# Patient Record
Sex: Female | Born: 1935 | Race: Black or African American | Hispanic: No
Health system: Midwestern US, Community
[De-identification: ages and names within clinical notes are randomized; demographics above are authoritative.]

## PROBLEM LIST (undated history)

## (undated) DIAGNOSIS — F0280 Dementia in other diseases classified elsewhere without behavioral disturbance: Secondary | ICD-10-CM

## (undated) DIAGNOSIS — F411 Generalized anxiety disorder: Secondary | ICD-10-CM

## (undated) DIAGNOSIS — M810 Age-related osteoporosis without current pathological fracture: Secondary | ICD-10-CM

## (undated) DIAGNOSIS — R634 Abnormal weight loss: Secondary | ICD-10-CM

## (undated) DIAGNOSIS — F419 Anxiety disorder, unspecified: Secondary | ICD-10-CM

## (undated) DIAGNOSIS — E049 Nontoxic goiter, unspecified: Secondary | ICD-10-CM

## (undated) DIAGNOSIS — I499 Cardiac arrhythmia, unspecified: Secondary | ICD-10-CM

## (undated) DIAGNOSIS — R4583 Excessive crying of child, adolescent or adult: Secondary | ICD-10-CM

## (undated) DIAGNOSIS — I10 Essential (primary) hypertension: Secondary | ICD-10-CM

## (undated) DIAGNOSIS — R7309 Other abnormal glucose: Secondary | ICD-10-CM

## (undated) DIAGNOSIS — R011 Cardiac murmur, unspecified: Secondary | ICD-10-CM

## (undated) DIAGNOSIS — F32A Depression, unspecified: Secondary | ICD-10-CM

## (undated) DIAGNOSIS — K047 Periapical abscess without sinus: Secondary | ICD-10-CM

## (undated) DIAGNOSIS — F329 Major depressive disorder, single episode, unspecified: Secondary | ICD-10-CM

## (undated) DIAGNOSIS — G20 Parkinson's disease: Secondary | ICD-10-CM

## (undated) DIAGNOSIS — G20A1 Parkinson's disease without dyskinesia, without mention of fluctuations: Secondary | ICD-10-CM

## (undated) HISTORY — DX: Periapical abscess without sinus: K04.7

## (undated) HISTORY — PX: OTHER SURGICAL HISTORY: SHX169

---

## 2007-04-23 LAB — T4, FREE: T4, Free: 1.2 NG/DL (ref 0.89–1.76)

## 2007-04-23 LAB — LIPID PANEL
CHOL/HDL Ratio: 3.6 (ref 0–5.0)
Cholesterol, total: 222 MG/DL — ABNORMAL HIGH (ref <200)
HDL Cholesterol: 62 MG/DL — ABNORMAL HIGH (ref 40–60)
LDL, calculated: 117.6 MG/DL — ABNORMAL HIGH (ref 0–100)
Triglyceride: 212 MG/DL — ABNORMAL HIGH (ref 30–200)
VLDL, calculated: 42.4 MG/DL

## 2007-04-23 LAB — HEPATIC FUNCTION PANEL
A-G Ratio: 1.1 (ref 1.1–2.2)
ALT (SGPT): 36 U/L (ref 30–65)
AST (SGOT): 28 U/L (ref 15–37)
Albumin: 3.5 g/dL (ref 3.5–5.0)
Alk. phosphatase: 82 U/L (ref 50–136)
Bilirubin, direct: 0 MG/DL (ref 0.0–0.3)
Bilirubin, total: 0.2 MG/DL (ref 0–1.0)
Globulin: 3.3 g/dL (ref 2.0–4.0)
Protein, total: 6.8 g/dL (ref 6.4–8.2)

## 2007-04-23 LAB — TSH 3RD GENERATION: TSH: 0.78 u[IU]/mL (ref 0.35–5.5)

## 2007-09-09 LAB — METABOLIC PANEL, COMPREHENSIVE
A-G Ratio: 1.2 (ref 1.1–2.2)
ALT (SGPT): 37 U/L (ref 30–65)
AST (SGOT): 29 U/L (ref 15–37)
Albumin: 3.8 g/dL (ref 3.5–5.0)
Alk. phosphatase: 93 U/L (ref 50–136)
Anion gap: 5 mmol/L (ref 5–15)
BUN/Creatinine ratio: 21 — ABNORMAL HIGH (ref 12–20)
BUN: 23 MG/DL — ABNORMAL HIGH (ref 6–20)
Bilirubin, total: 0.5 MG/DL (ref <1.0)
CO2: 32 MMOL/L (ref 21–32)
Calcium: 9.5 MG/DL (ref 8.5–10.1)
Chloride: 105 MMOL/L (ref 97–108)
Creatinine: 1.1 MG/DL (ref 0.6–1.3)
GFR est AA: >60 mL/min/{1.73_m2} (ref >60)
GFR est non-AA: 52 mL/min/{1.73_m2} — ABNORMAL LOW (ref >60)
Globulin: 3.2 g/dL (ref 2.0–4.0)
Glucose: 102 MG/DL — ABNORMAL HIGH (ref 50–100)
Potassium: 4.8 MMOL/L (ref 3.5–5.1)
Protein, total: 7 g/dL (ref 6.4–8.2)
Sodium: 142 MMOL/L (ref 136–145)

## 2007-09-09 LAB — LIPID PANEL
CHOL/HDL Ratio: 3.5 (ref 0–5.0)
Cholesterol, total: 190 MG/DL (ref <200)
HDL Cholesterol: 55 MG/DL (ref 40–60)
LDL, calculated: 111.4 MG/DL — ABNORMAL HIGH (ref 0–100)
Triglyceride: 118 MG/DL (ref 30–200)
VLDL, calculated: 23.6 MG/DL

## 2007-09-17 LAB — D-DIMER, QUANTITATIVE: D-Dimer, Quant: 0.97 MG/L (ref 0.0–3.0)

## 2007-09-17 LAB — CBC WITH AUTOMATED DIFF
ABS. BASOPHILS: 0 10*3/uL (ref 0.0–0.1)
ABS. EOSINOPHILS: 0.2 10*3/uL (ref 0.0–0.4)
ABS. LYMPHOCYTES: 2.4 10*3/uL (ref 0.8–3.5)
ABS. MONOCYTES: 0.5 10*3/uL (ref 0–1.0)
ABS. NEUTROPHILS: 1.3 10*3/uL — ABNORMAL LOW (ref 1.8–8.0)
BASOPHILS: 1 % (ref 0–1)
EOSINOPHILS: 5 % (ref 0–7)
HCT: 36.8 % (ref 35.0–47.0)
HGB: 12.2 g/dL (ref 11.5–16.0)
LYMPHOCYTES: 53 % — ABNORMAL HIGH (ref 12–49)
MCH: 29.7 PG (ref 26.0–34.0)
MCHC: 33.2 g/dL (ref 30.0–35.0)
MCV: 89.5 FL (ref 80.0–99.0)
MONOCYTES: 12 % (ref 5–13)
NEUTROPHILS: 29 % — ABNORMAL LOW (ref 32–75)
PLATELET: 216 10*3/uL (ref 150–400)
RBC: 4.11 M/uL (ref 3.80–5.20)
RDW: 12.9 % (ref 11.5–14.5)
WBC: 4.4 10*3/uL (ref 3.6–11.0)

## 2007-09-17 LAB — D DIMER: D-dimer: 0.97 MG/L (ref 0.0–3.0)

## 2007-09-17 LAB — METABOLIC PANEL, COMPREHENSIVE
A-G Ratio: 1 — ABNORMAL LOW (ref 1.1–2.2)
ALT (SGPT): 36 U/L (ref 30–65)
AST (SGOT): 23 U/L (ref 15–37)
Albumin: 3.4 g/dL — ABNORMAL LOW (ref 3.5–5.0)
Alk. phosphatase: 94 U/L (ref 50–136)
Anion gap: 5 mmol/L (ref 5–15)
BUN/Creatinine ratio: 19 (ref 12–20)
BUN: 23 MG/DL — ABNORMAL HIGH (ref 6–20)
Bilirubin, total: 0.3 MG/DL (ref <1.0)
CO2: 32 MMOL/L (ref 21–32)
Calcium: 9.1 MG/DL (ref 8.5–10.1)
Chloride: 102 MMOL/L (ref 97–108)
Creatinine: 1.2 MG/DL (ref 0.6–1.3)
GFR est AA: 57 mL/min/{1.73_m2} — ABNORMAL LOW (ref >60)
GFR est non-AA: 47 mL/min/{1.73_m2} — ABNORMAL LOW (ref >60)
Globulin: 3.5 g/dL (ref 2.0–4.0)
Glucose: 106 MG/DL — ABNORMAL HIGH (ref 50–100)
Potassium: 3.9 MMOL/L (ref 3.5–5.1)
Protein, total: 6.9 g/dL (ref 6.4–8.2)
Sodium: 139 MMOL/L (ref 136–145)

## 2007-09-17 LAB — PROTHROMBIN TIME + INR
INR: 1 (ref 0.9–1.1)
Prothrombin time: 10.3 SECS (ref 9.0–11.0)

## 2007-09-17 LAB — TROPONIN I
Troponin-I, Qt.: <0.04 ng/mL (ref 0.0–0.05)
Troponin-I, Qt.: <0.04 ng/mL (ref 0.0–0.05)

## 2007-09-17 LAB — CK W/ REFLX CKMB
CK: 138 U/L (ref 21–215)
CK: 125 U/L (ref 21–215)

## 2007-09-17 LAB — PTT, ACTIVATED: aPTT: 24.1 s (ref 24.0–33.0)

## 2007-09-17 LAB — NT-PRO BNP: NT pro-BNP: 1446 PG/ML — ABNORMAL HIGH (ref 0–125)

## 2007-10-23 LAB — CULTURE, URINE
Colonies Counted: <1000
Colony Count: <1000
Culture result:: NO GROWTH
Culture: NO GROWTH

## 2008-04-09 MED ORDER — ALENDRONATE 70 MG TAB
70 mg | ORAL_TABLET | ORAL | Status: DC
Start: 2008-04-09 — End: 2009-09-19

## 2008-04-09 MED ORDER — VALSARTAN-HYDROCHLOROTHIAZIDE 160 MG-12.5 MG TAB
ORAL_TABLET | Freq: Every day | ORAL | Status: DC
Start: 2008-04-09 — End: 2009-05-20

## 2008-04-09 NOTE — Progress Notes (Signed)
Subjective:     Gina Holloway is a 74 y.o. female who presents for follow up of hypertension and hyperlipidemia.    Diet and Lifestyle: generally follows a low fat low cholesterol diet, exercises sporadically  Home BP Monitoring: is not measured at home    Cardiovascular ROS: taking medications as instructed, no medication side effects noted, no chest pain on exertion, no dyspnea on exertion, no swelling of ankles.     New concerns:none            Review of Systems, additional:  Pertinent items are noted in HPI.    Objective:     BP 136/62   Pulse 84   Temp(Src) 98.7 ??F (37.1 ??C) (Oral)   Wt 161 lb (73.029 kg)   SpO2 97%  Appearance: alert, well appearing, and in no distress.thyroid mildly palpable goiter.  General exam: CVS exam BP noted to be well controlled today in office, S1, S2 normal, no gallop, no murmur, chest clear, no JVD, no HSM, no edema, CVS exam  - normal rate, regular rhythm, normal S1, S2, no murmurs, rubs, clicks or gallops, normal bilateral carotid upstroke without bruits.  Lab review: orders written for new lab studies as appropriate; see orders.     Assessment:     hypertension well controlled, hyperlipidemia stable.  Osteoporosis, thyroid goiter  Plan:     current treatment plan is effective, no change in therapy  reviewed diet, exercise and weight control.   also given rx for fosamax. Advise to continue with otc calcuim.   Fu 4 months

## 2008-04-09 NOTE — Progress Notes (Signed)
Follow up    Need medication refills on Diovan-160/12.5mg , Protonix 40mg , and Metoprolol ER-25mg 

## 2008-04-10 ENCOUNTER — Ambulatory Visit

## 2008-04-10 LAB — METABOLIC PANEL, COMPREHENSIVE
A-G Ratio: 1.1 (ref 1.1–2.2)
ALT (SGPT): 44 U/L (ref 30–65)
AST (SGOT): 32 U/L (ref 15–37)
Albumin: 3.6 g/dL (ref 3.5–5.0)
Alk. phosphatase: 113 U/L (ref 50–136)
Anion gap: 8 mmol/L (ref 5–15)
BUN/Creatinine ratio: 16 (ref 12–20)
BUN: 19 MG/DL (ref 6–20)
Bilirubin, total: 0.2 MG/DL (ref 0.2–1.0)
CO2: 32 MMOL/L (ref 21–32)
Calcium: 9.5 MG/DL (ref 8.5–10.1)
Chloride: 102 MMOL/L (ref 97–108)
Creatinine: 1.2 MG/DL (ref 0.6–1.3)
GFR est AA: 57 mL/min/{1.73_m2} — ABNORMAL LOW (ref >60)
GFR est non-AA: 47 mL/min/{1.73_m2} — ABNORMAL LOW (ref >60)
Globulin: 3.2 g/dL (ref 2.0–4.0)
Glucose: 126 MG/DL — ABNORMAL HIGH (ref 50–100)
Potassium: 4.4 MMOL/L (ref 3.5–5.1)
Protein, total: 6.8 g/dL (ref 6.4–8.4)
Sodium: 142 MMOL/L (ref 136–145)

## 2008-04-10 LAB — CBC WITH AUTOMATED DIFF
ABS. BASOPHILS: 0 10*3/uL (ref 0.0–0.1)
ABS. EOSINOPHILS: 0.1 10*3/uL (ref 0.0–0.4)
ABS. LYMPHOCYTES: 2.2 10*3/uL (ref 0.8–3.5)
ABS. MONOCYTES: 0.4 10*3/uL (ref 0.0–1.0)
ABS. NEUTROPHILS: 1.7 10*3/uL — ABNORMAL LOW (ref 1.8–8.0)
BASOPHILS: 1 % (ref 0–1)
EOSINOPHILS: 2 % (ref 0–7)
HCT: 38.3 % (ref 35.0–47.0)
HGB: 12.4 g/dL (ref 11.5–16.0)
LYMPHOCYTES: 48 % (ref 12–49)
MCH: 28.8 PG (ref 26.0–34.0)
MCHC: 32.4 g/dL (ref 30.0–36.5)
MCV: 88.9 FL (ref 80.0–99.0)
MONOCYTES: 10 % (ref 5–13)
NEUTROPHILS: 39 % (ref 32–75)
PLATELET: 230 10*3/uL (ref 150–400)
RBC: 4.31 M/uL (ref 3.80–5.20)
RDW: 12.9 % (ref 11.5–14.5)
WBC: 4.5 10*3/uL (ref 3.6–11.0)

## 2008-04-10 LAB — LIPID PANEL
CHOL/HDL Ratio: 2.6 (ref 0–5.0)
Cholesterol, total: 179 MG/DL (ref <200)
HDL Cholesterol: 69 MG/DL — ABNORMAL HIGH (ref 40–60)
LDL, calculated: 75.2 MG/DL (ref 0–100)
Triglyceride: 174 MG/DL (ref 30–200)
VLDL, calculated: 34.8 MG/DL

## 2008-04-10 LAB — TSH 3RD GENERATION: TSH: 0.7 u[IU]/mL (ref 0.36–3.74)

## 2008-04-10 LAB — T4, FREE: T4, Free: 1.1 NG/DL (ref 0.8–1.5)

## 2008-04-12 LAB — VITAMIN D, 25 HYDROXY: Vitamin D 25-Hydroxy: 34 ng/mL (ref 30–80)

## 2008-08-13 ENCOUNTER — Ambulatory Visit

## 2008-08-13 LAB — AMB POC GLUCOSE BLOOD, BY GLUCOSE MONITORING DEVICE

## 2008-08-13 MED ORDER — ALPRAZOLAM 0.25 MG TAB
0.25 mg | ORAL_TABLET | Freq: Two times a day (BID) | ORAL | Status: DC | PRN
Start: 2008-08-13 — End: 2009-09-19

## 2008-08-13 MED ORDER — ATORVASTATIN 40 MG TAB
40 mg | ORAL_TABLET | Freq: Every evening | ORAL | Status: DC
Start: 2008-08-13 — End: 2009-10-20

## 2008-08-13 MED ORDER — METOPROLOL SUCCINATE SR 25 MG 24 HR TAB
25 mg | ORAL_TABLET | Freq: Every day | ORAL | Status: DC
Start: 2008-08-13 — End: 2009-08-16

## 2008-08-13 NOTE — Progress Notes (Signed)
4 month follow up    Pt c/o right wrist pain that comes and goes- x 1 month    Pt c/o having a burning sensation in fingers on left hand x 1 month

## 2008-08-14 LAB — METABOLIC PANEL, BASIC
Anion gap: 9 mmol/L (ref 5–15)
BUN/Creatinine ratio: 17 (ref 12–20)
BUN: 20 MG/DL (ref 6–20)
CO2: 30 MMOL/L (ref 21–32)
Calcium: 9.1 MG/DL (ref 8.5–10.1)
Chloride: 99 MMOL/L (ref 97–108)
Creatinine: 1.2 MG/DL (ref 0.6–1.3)
GFR est AA: 57 mL/min/{1.73_m2} — ABNORMAL LOW (ref >60)
GFR est non-AA: 47 mL/min/{1.73_m2} — ABNORMAL LOW (ref >60)
Glucose: 89 MG/DL (ref 65–100)
Potassium: 4.2 MMOL/L (ref 3.5–5.1)
Sodium: 138 MMOL/L (ref 136–145)

## 2008-08-14 LAB — HEMOGLOBIN A1C WITH EAG: Hemoglobin A1c: 6 % — ABNORMAL HIGH (ref 4.2–5.8)

## 2008-11-02 NOTE — Progress Notes (Signed)
HISTORY OF PRESENT ILLNESS  Gina Holloway is a 74 y.o. female.  HPI for follow up.  Having increas in burning senation in the left hand/finger. Is intermittent.  No triggering factior noted.  No pain at night usually.  Also having right wrist pain.  No know injury. Hurts with ROM.  Cardiovascular Review:  The patient has hypertension and hyperlipidemia.  Diet and Lifestyle: generally follows a low fat low cholesterol diet, generally follows a low sodium diet, exercises sporadically  Home BP Monitoring: is not measured at home.  Pertinent ROS: taking medications as instructed, no medication side effects noted, no TIA's, no chest pain on exertion, no dyspnea on exertion, no swelling of ankles.      Review of Systems   Constitutional: Negative for malaise/fatigue.   HENT: Negative for congestion.    Eyes: Negative for blurred vision.   Respiratory: Negative for cough and shortness of breath.    Cardiovascular: Negative for chest pain, palpitations and leg swelling.   Gastrointestinal: Negative for heartburn, abdominal pain and constipation.   Genitourinary: Negative for dysuria, urgency and frequency.   Musculoskeletal: Negative for back pain and joint pain.   Neurological: Negative for dizziness, tingling and headaches.   Endo/Heme/Allergies: Negative for environmental allergies.   Psychiatric/Behavioral: Negative for depression. The patient does not have insomnia.        Physical Exam   Vitals reviewed.  Constitutional: She is oriented to person, place, and time. She appears well-developed and well-nourished.   HENT:   Right Ear: Tympanic membrane and ear canal normal.   Left Ear: Tympanic membrane and ear canal normal.   Nose: Nose normal. No mucosal edema or rhinorrhea.   Mouth/Throat: Oropharynx is clear and moist and mucous membranes are normal. No oropharyngeal exudate or posterior oropharyngeal erythema.    Neck: Trachea normal, normal range of motion and full passive range of motion without pain. Neck supple. No JVD present. No edema present. No thyromegaly present.   Cardiovascular: Normal rate and regular rhythm.    No murmur heard.  Pulmonary/Chest: Effort normal and breath sounds normal.   Abdominal: Soft. Normal appearance and bowel sounds are normal. She exhibits no distension. No tenderness.   Musculoskeletal: Normal range of motion. She exhibits no edema.        Right wrist: She exhibits tenderness. She exhibits normal range of motion.        Left hand: She exhibits normal range of motion and no tenderness. normal sensation noted. Decreased sensation is not present in the ulnar distribution, is not present in the medial redistribution and is not present in the radial distribution. Normal strength noted.   Lymphadenopathy:     She has no cervical adenopathy.   Neurological: She is alert and oriented to person, place, and time. She has normal strength. No sensory deficit. Gait normal.   Skin: Skin is warm and dry. No rash noted.       ASSESSMENT and PLAN  Simra was seen today for follow-up, other and wrist pain.    Diagnoses and associated orders for this visit:    Htn (hypertension)  - METABOLIC PANEL, BASIC; Future  - metoprolol-XL (TOPROL XL) 25 mg XL tablet; Take 1 Tab by mouth daily.    Gerd (gastroesophageal reflux disease)    Hypercholesterolemia  - atorvastatin (LIPITOR) 40 mg tablet; Take 1 Tab by mouth nightly.    Thyroid goiter    Cervical /Neuralgia  - XR SPINE CERVICAL COMP WITH OBLIQUES; Future  - naproxen (NAPROSYN)  500 mg tablet; Take 1 Tab by mouth two (2) times daily (with meals).    Tenosynovitis, wrist  - naproxen (NAPROSYN) 500 mg tablet; Take 1 Tab by mouth two (2) times daily (with meals).    Anxiety state, unspecified  - alprazolam (XANAX) 0.25 mg tablet; Take 1 Tab by mouth two (2) times daily as needed.    Other abnormal glucose  - AMB POC GLUCOSE BLOOD, BY GLUCOSE MONITORING DEVICE   - HEMOGLOBIN A1C; Future    Encounter for long-term (current) use of other medications    Other Orders  - Discontinue: alprazolam (XANAX) 0.25 mg tablet; Take 0.25 mg by mouth two (2) times daily as needed.        Follow-up Disposition:  Return in about 4 months (around 12/14/2008).  reviewed diet, exercise and weight control

## 2008-12-17 MED ORDER — AMOXICILLIN 500 MG CAP
500 mg | ORAL_CAPSULE | Freq: Three times a day (TID) | ORAL | Status: AC
Start: 2008-12-17 — End: 2008-12-27

## 2008-12-17 NOTE — Progress Notes (Signed)
HISTORY OF PRESENT ILLNESS  Gina Holloway is a 74 y.o. female.  HPI for follow up.  Overall doing well.  Does c/o cough and nasal congestion for the past several days.  Nose is dry and crusty.  Has post nasal drainage.  Cardiovascular Review:  The patient has hypertension and hyperlipidemia.  Diet and Lifestyle: generally follows a low fat low cholesterol diet, generally follows a low sodium diet, exercises sporadically  Home BP Monitoring: is not measured at home.  Pertinent ROS: taking medications as instructed, no medication side effects noted, no TIA's, no chest pain on exertion, no dyspnea on exertion, no swelling of ankles.      Review of Systems   Constitutional: Negative for fever, chills and malaise/fatigue.   HENT: Positive for congestion.    Eyes: Negative for blurred vision.   Respiratory: Positive for cough. Negative for sputum production and shortness of breath.    Cardiovascular: Negative for chest pain, palpitations and leg swelling.   Gastrointestinal: Negative for heartburn, abdominal pain and constipation.   Genitourinary: Negative for dysuria, urgency and frequency.   Musculoskeletal: Negative for back pain and joint pain.   Neurological: Negative for dizziness, tingling and headaches.   Endo/Heme/Allergies: Positive for environmental allergies.   Psychiatric/Behavioral: Negative for depression. The patient does not have insomnia.        Physical Exam   Vitals reviewed.  Constitutional: She is oriented to person, place, and time. She appears well-developed and well-nourished.   HENT:   Right Ear: Tympanic membrane and ear canal normal.   Left Ear: Tympanic membrane and ear canal normal.   Nose: Mucosal edema and rhinorrhea present. Right sinus exhibits no maxillary sinus tenderness. Left sinus exhibits no maxillary sinus tenderness.   Mouth/Throat: Mucous membranes are normal. Posterior oropharyngeal erythema present. No oropharyngeal exudate.    Eyes: Pupils are equal, round, and reactive to light.   Neck: Trachea normal, normal range of motion and full passive range of motion without pain. Neck supple. No JVD present. No edema present. No thyromegaly present.   Cardiovascular: Normal rate and regular rhythm.    No murmur heard.  Pulmonary/Chest: Effort normal and breath sounds normal.   Abdominal: Soft. Normal appearance and bowel sounds are normal. She exhibits no distension. No tenderness.   Musculoskeletal: Normal range of motion. She exhibits no edema.   Lymphadenopathy:     She has no cervical adenopathy.   Neurological: She is alert and oriented to person, place, and time. She has normal strength. No sensory deficit. Gait normal.   Skin: Skin is warm and dry. No rash noted.       ASSESSMENT and PLAN  Anairis was seen today for hypertension, nasal congestion and cough.    Diagnoses and associated orders for this visit:  Overall stable.    Htn (hypertension)    Hypercholesterolemia    Gerd (gastroesophageal reflux disease)    Thyroid goiter    Osteoporosis    Acute upper respiratory infections of unspecified   - sodium chloride (AYR SALINE) 0.65 % nasal spray; 1 Spray by Nasal route as needed for Congestion.  - amoxicillin (AMOXIL) 500 mg capsule; Take 1 Cap by mouth three (3) times daily for 10 days.    Encounter for long-term (current) use of other medications        Follow-up Disposition:  Return in about 4 months (around 04/16/2009).  reviewed diet, exercise and weight control  cardiovascular risk and specific lipid/LDL goals reviewed  Labs with next ov.

## 2009-03-01 MED ORDER — AMOXICILLIN 500 MG CAP
500 mg | ORAL_CAPSULE | Freq: Three times a day (TID) | ORAL | Status: AC
Start: 2009-03-01 — End: 2009-03-11

## 2009-03-01 NOTE — Telephone Encounter (Signed)
Patient has blood tinged mucus in her sinuses. She went to [facility], and they gave her a nasal spray and some eye drops, but no antibiotic. She is requesting an antibiotic at this time.

## 2009-04-19 NOTE — Progress Notes (Signed)
HISTORY OF PRESENT ILLNESS  Gina Holloway is a 75 y.o. female.  HPI for follow up.  Doing well.    Cardiovascular Review:  The patient has hypertension and hyperlipidemia.  Diet and Lifestyle: generally follows a low fat low cholesterol diet, generally follows a low sodium diet, exercises sporadically  Home BP Monitoring: is not measured at home.  Pertinent ROS: taking medications as instructed, no medication side effects noted, no TIA's, no chest pain on exertion, no dyspnea on exertion, no swelling of ankles.   Thyroid Review:  Patient is seen for followup of warm/hot thyroid nodule.   Thyroid ROS: denies fatigue, weight changes, heat/cold intolerance, bowel/skin changes or CVS symptoms and thinks goiter is larger.     Review of Systems   Constitutional: Negative for malaise/fatigue.   HENT: Negative for congestion.    Eyes: Negative for blurred vision.   Respiratory: Negative for cough and shortness of breath.    Cardiovascular: Negative for chest pain, palpitations and leg swelling.   Gastrointestinal: Negative for heartburn, abdominal pain and constipation.   Genitourinary: Negative for dysuria, urgency and frequency.   Musculoskeletal: Negative for back pain and joint pain.   Neurological: Negative for dizziness, tingling and headaches.   Endo/Heme/Allergies: Negative for environmental allergies.   Psychiatric/Behavioral: Negative for depression. The patient does not have insomnia.        Physical Exam   Vitals reviewed.  Constitutional: She appears well-developed and well-nourished.   HENT:   Right Ear: Tympanic membrane and ear canal normal.   Left Ear: Tympanic membrane and ear canal normal.   Nose: No mucosal edema or rhinorrhea.   Mouth/Throat: Oropharynx is clear and moist and mucous membranes are normal.   Neck: Normal range of motion. Neck supple. Thyromegaly (slightly more fullnes in the mid gland.) present.   Cardiovascular: Normal rate and regular rhythm.    No murmur heard.   Pulmonary/Chest: Effort normal and breath sounds normal.   Abdominal: Soft. Bowel sounds are normal. No tenderness.   Musculoskeletal: Normal range of motion. She exhibits no edema.   Lymphadenopathy:     She has no cervical adenopathy.   Skin: Skin is warm and dry.   Psychiatric: She has a normal mood and affect.       ASSESSMENT and PLAN  Gina Holloway was seen today for hypertension.    Diagnoses and associated orders for this visit:    Htn (hypertension)    Hypercholesterolemia  - LIPID PANEL    Gerd (gastroesophageal reflux disease)    Thyroid goiter  - US THYROID / PARATHYROID; Future  - TSH, 3RD GENERATION  - T4, FREE    Osteoporosis  - VITAMIN D, 25 HYDROXY    Encounter for long-term (current) use of other medications  - METABOLIC PANEL, COMPREHENSIVE  - CBC W/O DIFF        Follow-up Disposition:  Return in about 5 months (around 09/19/2009).  reviewed diet, exercise and weight control  cardiovascular risk and specific lipid/LDL goals reviewed

## 2009-04-19 NOTE — Progress Notes (Deleted)
HISTORY OF PRESENT ILLNESS  Gina Holloway is a 75 y.o. female.  HPI for follow up.  Overall doing well.    Cardiovascular Review:  The patient has hypertension and hyperlipidemia.  Diet and Lifestyle: generally follows a low fat low cholesterol diet, generally follows a low sodium diet, exercises sporadically  Home BP Monitoring: is not measured at home.  Pertinent ROS: taking medications as instructed, no medication side effects noted, no TIA's, no chest pain on exertion, no dyspnea on exertion, no swelling of ankles.      Review of Systems   Constitutional: Negative for fever, chills and malaise/fatigue.   HENT: Positive for congestion.    Eyes: Negative for blurred vision.   Respiratory: Positive for cough. Negative for sputum production and shortness of breath.    Cardiovascular: Negative for chest pain, palpitations and leg swelling.   Gastrointestinal: Negative for heartburn, abdominal pain and constipation.   Genitourinary: Negative for dysuria, urgency and frequency.   Musculoskeletal: Negative for back pain and joint pain.   Neurological: Negative for dizziness, tingling and headaches.   Endo/Heme/Allergies: Positive for environmental allergies.   Psychiatric/Behavioral: Negative for depression. The patient does not have insomnia.        Physical Exam   Vitals reviewed.  Constitutional: She is oriented to person, place, and time. She appears well-developed and well-nourished.   HENT:   Right Ear: Tympanic membrane and ear canal normal.   Left Ear: Tympanic membrane and ear canal normal.   Nose: Mucosal edema and rhinorrhea present. Right sinus exhibits no maxillary sinus tenderness. Left sinus exhibits no maxillary sinus tenderness.   Mouth/Throat: Mucous membranes are normal. Posterior oropharyngeal erythema present. No oropharyngeal exudate.   Eyes: Pupils are equal, round, and reactive to light.    Neck: Trachea normal, normal range of motion and full passive range of motion without pain. Neck supple. No JVD present. No edema present. No thyromegaly present.   Cardiovascular: Normal rate and regular rhythm.    No murmur heard.  Pulmonary/Chest: Effort normal and breath sounds normal.   Abdominal: Soft. Normal appearance and bowel sounds are normal. She exhibits no distension. No tenderness.   Musculoskeletal: Normal range of motion. She exhibits no edema.   Lymphadenopathy:     She has no cervical adenopathy.   Neurological: She is alert and oriented to person, place, and time. She has normal strength. No sensory deficit. Gait normal.   Skin: Skin is warm and dry. No rash noted.       ASSESSMENT and PLAN  Gina Holloway was seen today for hypertension, nasal congestion and cough.    Diagnoses and associated orders for this visit:  Overall stable.    Htn (hypertension)    Hypercholesterolemia    Gerd (gastroesophageal reflux disease)    Thyroid goiter    Osteoporosis    Acute upper respiratory infections of unspecified   - sodium chloride (AYR SALINE) 0.65 % nasal spray; 1 Spray by Nasal route as needed for Congestion.  - amoxicillin (AMOXIL) 500 mg capsule; Take 1 Cap by mouth three (3) times daily for 10 days.    Encounter for long-term (current) use of other medications        Follow-up Disposition:  Return in about 5 months (around 09/19/2009).  reviewed diet, exercise and weight control  cardiovascular risk and specific lipid/LDL goals reviewed  Labs with next ov.

## 2009-04-20 LAB — METABOLIC PANEL, COMPREHENSIVE
A-G Ratio: 1.5 (ref 1.1–2.5)
ALT (SGPT): 21 IU/L (ref 0–40)
AST (SGOT): 32 IU/L (ref 0–40)
Albumin: 4 g/dL (ref 3.5–4.8)
Alk. phosphatase: 64 IU/L (ref 25–165)
BUN/Creatinine ratio: 16 (ref 11–26)
BUN: 18 mg/dL (ref 8–27)
Bilirubin, total: 0.5 mg/dL (ref 0.0–1.2)
CO2: 28 mmol/L (ref 20–32)
Calcium: 9.2 mg/dL (ref 8.6–10.2)
Chloride: 101 mmol/L (ref 97–108)
Creatinine: 1.12 mg/dL — ABNORMAL HIGH (ref 0.57–1.00)
GFR est AA: 58 mL/min/{1.73_m2} — ABNORMAL LOW (ref >59)
GFR est non-AA: 48 mL/min/{1.73_m2} — ABNORMAL LOW (ref >59)
GLOBULIN, TOTAL: 2.6 g/dL (ref 1.5–4.5)
Glucose: 103 mg/dL — ABNORMAL HIGH (ref 65–99)
Potassium: 4.4 mmol/L (ref 3.5–5.2)
Protein, total: 6.6 g/dL (ref 6.0–8.5)
Sodium: 139 mmol/L (ref 135–145)

## 2009-04-20 LAB — CBC W/O DIFF
HCT: 41.4 % (ref 34.0–44.0)
HGB: 13.5 g/dL (ref 11.5–15.0)
MCH: 29.3 pg (ref 27.0–34.0)
MCHC: 32.6 g/dL (ref 32.0–36.0)
MCV: 90 fL (ref 80–98)
PLATELET: 231 10*3/uL (ref 140–415)
RBC: 4.61 x10E6/uL (ref 3.80–5.10)
RDW: 12.6 % (ref 11.7–15.0)
WBC: 4.6 10*3/uL (ref 4.0–10.5)

## 2009-04-20 LAB — LIPID PANEL
Cholesterol, total: 196 mg/dL (ref 100–199)
HDL Cholesterol: 69 mg/dL (ref >39)
LDL, calculated: 101 mg/dL — ABNORMAL HIGH (ref 0–99)
Triglyceride: 132 mg/dL (ref 0–149)
VLDL, calculated: 26 mg/dL (ref 5–40)

## 2009-04-20 LAB — TSH 3RD GENERATION: TSH: 1.11 u[IU]/mL (ref 0.450–4.500)

## 2009-04-20 LAB — VITAMIN D, 25 HYDROXY: Vitamin D 25-Hydroxy: 35.1 ng/mL (ref 32.0–100.0)

## 2009-04-20 LAB — T4, FREE: T4, Free: 1.29 ng/dL (ref 0.82–1.77)

## 2009-05-20 MED ORDER — VALSARTAN-HYDROCHLOROTHIAZIDE 160 MG-12.5 MG TAB
ORAL_TABLET | ORAL | Status: DC
Start: 2009-05-20 — End: 2009-07-18

## 2009-06-01 NOTE — Telephone Encounter (Signed)
Called pt- went to [facility] was given Zpak- not getting better- pt has only had 2 doses of antibiotic- explained to pt need to finish antibiotic and if not feeling better by Friday call back. Need to give time for med to work- pt verbalized understanding

## 2009-07-18 MED ORDER — VALSARTAN-HYDROCHLOROTHIAZIDE 160 MG-12.5 MG TAB
ORAL_TABLET | ORAL | Status: DC
Start: 2009-07-18 — End: 2009-09-19

## 2009-08-17 MED ORDER — METOPROLOL SUCCINATE SR 25 MG 24 HR TAB
25 mg | ORAL_TABLET | ORAL | Status: DC
Start: 2009-08-17 — End: 2009-09-19

## 2009-08-17 MED ORDER — METOPROLOL SUCCINATE SR 25 MG 24 HR TAB
25 mg | ORAL_TABLET | ORAL | Status: DC
Start: 2009-08-17 — End: 2009-08-17

## 2009-08-18 MED ORDER — METOPROLOL SUCCINATE SR 25 MG 24 HR TAB
25 mg | ORAL_TABLET | ORAL | Status: DC
Start: 2009-08-18 — End: 2009-09-19

## 2009-09-19 LAB — AMB POC URINALYSIS DIP STICK AUTO W/ MICRO
Bilirubin (UA POC): NEGATIVE
Blood (UA POC): NEGATIVE
Glucose (UA POC): NEGATIVE
Ketones (UA POC): NEGATIVE
Nitrites (UA POC): NEGATIVE
Protein (UA POC): NEGATIVE mg/dL
Specific gravity (UA POC): 1.015 (ref 1.001–1.035)
pH (UA POC): 6 (ref 4.6–8.0)

## 2009-09-19 MED ORDER — METOPROLOL SUCCINATE SR 25 MG 24 HR TAB
25 mg | ORAL_TABLET | Freq: Every day | ORAL | Status: DC
Start: 2009-09-19 — End: 2010-05-23

## 2009-09-19 MED ORDER — DEXLANSOPRAZOLE 60 MG CAPSULE,BIPHASE DELAYED RELEASE
60 mg | ORAL_TABLET | Freq: Every day | ORAL | Status: DC
Start: 2009-09-19 — End: 2010-01-16

## 2009-09-19 MED ORDER — VALSARTAN-HYDROCHLOROTHIAZIDE 160 MG-25 MG TAB
160-25 mg | ORAL_TABLET | Freq: Every day | ORAL | Status: DC
Start: 2009-09-19 — End: 2010-01-16

## 2009-09-19 NOTE — Progress Notes (Signed)
HISTORY OF PRESENT ILLNESS  Gina Holloway is a 75 y.o. female.  HPI for follow up.  Feeling bad over the past several weeks  C/o fatigue and has no energy.  decrease in appetite.  Having increase in heartburn sx and palpitations.  No sob.  Muscle aches all over.  Seen by cardiology and has on event monitor now.  Previous cardiac work up at [facility] last year included cardiac cath with findings of insignificant CAD.      Patient Active Problem List   Diagnoses Code   ??? HTN (Hypertension) 401.9AF   ??? Hypercholesterolemia 272.0L   ??? GERD (Gastroesophageal Reflux Disease) 530.81S   ??? Thyroid Goiter 240.9CD   ??? Osteoporosis 733.00C   ??? Encounter for long-term (current) use of other medications V58.69       Current outpatient prescriptions   Medication Sig Dispense Refill   ??? metoprolol-XL (TOPROL-XL) 25 mg XL tablet Take 1 Tab by mouth daily.  90 Tab  3   ??? valsartan-hydrochlorothiazide (DIOVAN-HCT) 160-25 mg per tablet Take 1 Tab by mouth daily.  30 Tab  1   ??? Dexlansoprazole (DEXILANT) 60 mg CpDM Take 1 Tab by mouth daily.  5 Tab  0   ??? atorvastatin (LIPITOR) 40 mg tablet Take 1 Tab by mouth nightly.  90 Tab  3   ??? aspirin 81 mg Tab Take 81 mg by mouth daily.            Review of Systems   Constitutional: Positive for malaise/fatigue.   HENT: Negative for congestion.    Eyes: Negative for blurred vision.   Respiratory: Negative for cough and shortness of breath.    Cardiovascular: Positive for palpitations. Negative for chest pain and leg swelling.   Gastrointestinal: Positive for heartburn and nausea. Negative for abdominal pain, diarrhea, constipation, blood in stool and melena.   Genitourinary: Negative for dysuria, urgency and frequency.   Musculoskeletal: Positive for myalgias. Negative for back pain and joint pain.   Neurological: Negative for dizziness, tingling and headaches.   Endo/Heme/Allergies: Negative for environmental allergies.    Psychiatric/Behavioral: Negative for depression. The patient does not have insomnia.        Physical Exam   Vitals reviewed.  Constitutional: She appears well-developed and well-nourished.   HENT:   Right Ear: Tympanic membrane and ear canal normal.   Left Ear: Tympanic membrane and ear canal normal.   Nose: No mucosal edema or rhinorrhea.   Mouth/Throat: Oropharynx is clear and moist and mucous membranes are normal.   Neck: Normal range of motion. Neck supple. No thyromegaly present.   Cardiovascular: Normal rate and regular rhythm.    No murmur heard.  Pulmonary/Chest: Effort normal and breath sounds normal.   Abdominal: Soft. Bowel sounds are normal. No tenderness.   Musculoskeletal: Normal range of motion. She exhibits no edema.   Lymphadenopathy:     She has no cervical adenopathy.   Skin: Skin is warm and dry.   Psychiatric: She has a normal mood and affect.       ASSESSMENT and PLAN  [name] was seen today for follow-up.    Diagnoses and associated orders for this visit:    Htn (hypertension) not to goal.  - metoprolol-XL (TOPROL-XL) 25 mg XL tablet; Take 1 Tab by mouth daily.  - Increase valsartan-hydrochlorothiazide (DIOVAN-HCT) 160-25 mg per tablet; Take 1 Tab by mouth daily.  Discussed sodium restriction, high k rich diet, maintaining ideal body weight and regular exercise program such as daily walking  30 min perday 4-5 times per week, as physiologic means to achieve blood pressure control.??     Hypercholesterolemia  - LIPID PANEL    Fatigue/myalgias  Hold on the lipitor for the next 2 weeks to see if this improves sx.    Palpitations  As per cardiology    Genella Rife (gastroesophageal reflux disease)  - REFERRAL TO GASTROENTEROLOGY  - Dexlansoprazole (DEXILANT) 60 mg CpDM; Take 1 Tab by mouth daily.    Thyroid goiter  - TSH, 3RD GENERATION  - T4, FREE    Osteoporosis    Encounter for long-term (current) use of other medications  - AMB POC COMPLETE CBC, AUTOMATED  - CK   - AMB POC URINALYSIS DIP STICK AUTO W/ MICRO        Follow-up Disposition:  Return in about 1 month (around 10/20/2009).  reviewed diet, exercise and weight control  cardiovascular risk and specific lipid/LDL goals reviewed  reviewed medications and side effects in detail

## 2009-09-20 LAB — LIPID PANEL
Cholesterol, total: 177 mg/dL (ref 100–199)
HDL Cholesterol: 74 mg/dL (ref >39)
LDL, calculated: 86 mg/dL (ref 0–99)
Triglyceride: 85 mg/dL (ref 0–149)
VLDL, calculated: 17 mg/dL (ref 5–40)

## 2009-09-20 LAB — T4, FREE: T4, Free: 1.47 ng/dL (ref 0.82–1.77)

## 2009-09-20 LAB — CK: Creatine Kinase,Total: 142 U/L (ref 24–173)

## 2009-09-20 LAB — TSH 3RD GENERATION: TSH: 0.733 u[IU]/mL (ref 0.450–4.500)

## 2009-10-12 LAB — AMB POC GLUCOSE, QUANTITATIVE, BLOOD

## 2009-10-12 LAB — AMB POC URINALYSIS DIP STICK AUTO W/ MICRO
Bilirubin (UA POC): NEGATIVE
Blood (UA POC): NEGATIVE
Glucose (UA POC): NEGATIVE
Leukocyte esterase (UA POC): NEGATIVE
Nitrites (UA POC): NEGATIVE
Protein (UA POC): NEGATIVE mg/dL
Specific gravity (UA POC): 1.025 (ref 1.001–1.035)
pH (UA POC): 6 (ref 4.6–8.0)

## 2009-10-12 NOTE — Progress Notes (Signed)
HISTORY OF PRESENT ILLNESS  Gina Holloway is a 75 y.o. female.  HPI for follow up hospital.  Gina Holloway to [facility] er for chest pain.  Had stress test per pt that was normal.  Still feeling bad.  Has no energy.  palpitations comes and goes.  Sleeping ok,  Diminished appetite.  No abd pain noted.  No fever or chills.    Patient Active Problem List   Diagnoses Code   ??? HTN (Hypertension) 401.9AF   ??? Hypercholesterolemia 272.0L   ??? GERD (Gastroesophageal Reflux Disease) 530.81S   ??? Thyroid Goiter 240.9CD   ??? Osteoporosis 733.00C   ??? Encounter for long-term (current) use of other medications V58.69       Current outpatient prescriptions   Medication Sig Dispense Refill   ??? sucralfate (CARAFATE) 1 gram tablet Take 0.5 g by mouth four (4) times daily.       ??? metoprolol-XL (TOPROL-XL) 25 mg XL tablet Take 1 Tab by mouth daily.  90 Tab  3   ??? valsartan-hydrochlorothiazide (DIOVAN-HCT) 160-25 mg per tablet Take 1 Tab by mouth daily.  30 Tab  1   ??? Dexlansoprazole (DEXILANT) 60 mg CpDM Take 1 Tab by mouth daily.  5 Tab  0   ??? atorvastatin (LIPITOR) 40 mg tablet Take 1 Tab by mouth nightly.  90 Tab  3   ??? aspirin 81 mg Tab Take 81 mg by mouth daily.            Review of Systems   Constitutional: Positive for malaise/fatigue. Negative for fever and chills.   HENT: Negative for congestion.    Eyes: Negative for blurred vision.   Respiratory: Negative for cough and shortness of breath.    Cardiovascular: Negative for chest pain, palpitations and leg swelling.   Gastrointestinal: Negative for heartburn, abdominal pain and constipation.   Genitourinary: Negative for dysuria, urgency and frequency.   Musculoskeletal: Negative for myalgias, back pain and joint pain.   Neurological: Negative for dizziness, tingling and headaches.   Endo/Heme/Allergies: Negative for environmental allergies.   Psychiatric/Behavioral: Negative for depression and memory loss. The patient is not nervous/anxious and does not have insomnia.        Physical Exam    Vitals reviewed.  Constitutional: She is oriented to person, place, and time. She appears well-developed and well-nourished. No distress.   HENT:   Right Ear: Tympanic membrane and ear canal normal.   Left Ear: Tympanic membrane and ear canal normal.   Nose: No mucosal edema or rhinorrhea.   Mouth/Throat: Oropharynx is clear and moist and mucous membranes are normal.   Neck: Normal range of motion. Neck supple. No thyromegaly present.   Cardiovascular: Normal rate and regular rhythm.    No murmur heard.  Pulmonary/Chest: Effort normal and breath sounds normal.   Abdominal: Soft. Bowel sounds are normal. No tenderness.   Musculoskeletal: Normal range of motion. She exhibits no edema.   Lymphadenopathy:     She has no cervical adenopathy.   Neurological: She is alert and oriented to person, place, and time. She has normal reflexes. No cranial nerve deficit or sensory deficit. She exhibits normal muscle tone. Coordination and gait normal.   Skin: Skin is warm and dry.   Psychiatric: She has a normal mood and affect.       ASSESSMENT and PLAN  Gina Holloway was seen today for follow-up, irregular heart beat and shortness of breath.    Diagnoses and associated orders for this visit:    [facility]  and fatigue    Atypical chest pain    GERD  - sucralfate (CARAFATE) 1 gram tablet; Take 0.5 g by mouth four (4) times daily.    Htn (hypertension)    Hypercholesterolemia    Palpitations  - MAGNESIUM    Other nonspecific finding on examination of urine  - CULTURE, URINE    Encounter for long-term (current) use of other medications  - CBC W/O DIFF  - AMB POC GLUCOSE, QUANTITATIVE, BLOOD  - METABOLIC PANEL, COMPREHENSIVE  - AMB POC URINALYSIS DIP STICK AUTO W/ MICRO              Follow-up Disposition:  Return in about 1 month (around 11/11/2009).  reviewed diet, exercise and weight control  cardiovascular risk and specific lipid/LDL goals reviewed  Will request records from [facility] to reveiwed

## 2009-10-13 LAB — CBC W/O DIFF
HCT: 40.2 % (ref 34.0–44.0)
HGB: 13.4 g/dL (ref 11.5–15.0)
MCH: 29.6 pg (ref 27.0–34.0)
MCHC: 33.3 g/dL (ref 32.0–36.0)
MCV: 89 fL (ref 80–98)
PLATELET: 232 10*3/uL (ref 140–415)
RBC: 4.53 x10E6/uL (ref 3.80–5.10)
RDW: 13.4 % (ref 11.7–15.0)
WBC: 4.4 10*3/uL (ref 4.0–10.5)

## 2009-10-13 LAB — METABOLIC PANEL, COMPREHENSIVE
A-G Ratio: 1.3 (ref 1.1–2.5)
ALT (SGPT): 16 IU/L (ref 0–40)
AST (SGOT): 24 IU/L (ref 0–40)
Albumin: 3.8 g/dL (ref 3.5–4.8)
Alk. phosphatase: 66 IU/L (ref 25–165)
BUN/Creatinine ratio: 21 (ref 11–26)
BUN: 22 mg/dL (ref 8–27)
Bilirubin, total: 0.2 mg/dL (ref 0.0–1.2)
CO2: 30 mmol/L (ref 20–32)
Calcium: 9.4 mg/dL (ref 8.6–10.2)
Chloride: 101 mmol/L (ref 97–108)
Creatinine: 1.04 mg/dL — ABNORMAL HIGH (ref 0.57–1.00)
GFR est AA: 62 mL/min/{1.73_m2} (ref >59)
GFR est non-AA: 53 mL/min/{1.73_m2} — ABNORMAL LOW (ref >59)
GLOBULIN, TOTAL: 2.9 g/dL (ref 1.5–4.5)
Glucose: 100 mg/dL — ABNORMAL HIGH (ref 65–99)
Potassium: 4.3 mmol/L (ref 3.5–5.2)
Protein, total: 6.7 g/dL (ref 6.0–8.5)
Sodium: 141 mmol/L (ref 135–145)

## 2009-10-13 LAB — MAGNESIUM: Magnesium: 2 mg/dL (ref 1.6–2.6)

## 2009-10-14 LAB — CULTURE, URINE

## 2009-10-20 MED ORDER — ATORVASTATIN 40 MG TAB
40 mg | ORAL_TABLET | ORAL | Status: DC
Start: 2009-10-20 — End: 2011-01-21

## 2009-11-02 MED ORDER — BUDESONIDE-FORMOTEROL HFA 160 MCG-4.5 MCG/ACTUATION AEROSOL INHALER
Freq: Two times a day (BID) | RESPIRATORY_TRACT | Status: DC
Start: 2009-11-02 — End: 2010-01-16

## 2009-11-02 NOTE — Progress Notes (Signed)
HISTORY OF PRESENT ILLNESS  Gina Holloway is a 75 y.o. female.  HPI  For follow up.  Feeling some better.  Less fatigue but still feel winded.  Records per [facility] showed negative myocardial scan.  Has had follow up with cardiology and told from cardiac standpoint that "everything was negative".  Has some cough.  Intermittent wheezing.  Nonsmoker.  No fever or chills.  No swelling.    Patient Active Problem List   Diagnoses Code   ??? HTN (Hypertension) 401.9AF   ??? Hypercholesterolemia 272.0L   ??? GERD (Gastroesophageal Reflux Disease) 530.81S   ??? Thyroid Goiter 240.9CD   ??? Osteoporosis 733.00C   ??? Encounter for long-term (current) use of other medications V58.69       Current outpatient prescriptions   Medication Sig Dispense Refill   ??? budesonide-formoterol (SYMBICORT) 160-4.5 mcg/Actuation HFA inhaler Take 1 Puff by inhalation two (2) times a day.  1 Inhaler  0   ??? atorvastatin (LIPITOR) 40 mg tablet TAKE 1 TABLET BY MOUTH EVERY NIGHT  90 Tab  3   ??? metoprolol-XL (TOPROL-XL) 25 mg XL tablet Take 1 Tab by mouth daily.  90 Tab  3   ??? valsartan-hydrochlorothiazide (DIOVAN-HCT) 160-25 mg per tablet Take 1 Tab by mouth daily.  30 Tab  1   ??? Dexlansoprazole (DEXILANT) 60 mg CpDM Take 1 Tab by mouth daily.  5 Tab  0   ??? aspirin 81 mg Tab Take 81 mg by mouth daily.            Review of Systems   Constitutional: Negative for malaise/fatigue.   HENT: Negative for congestion.    Eyes: Negative for blurred vision.   Respiratory: Positive for cough, shortness of breath and wheezing. Negative for sputum production.    Cardiovascular: Negative for chest pain, palpitations and leg swelling.   Gastrointestinal: Negative for heartburn, abdominal pain and constipation.   Genitourinary: Negative for dysuria, urgency and frequency.   Musculoskeletal: Negative for back pain and joint pain.   Neurological: Negative for dizziness, tingling and headaches.   Endo/Heme/Allergies: Negative for environmental allergies.    Psychiatric/Behavioral: Negative for depression. The patient does not have insomnia.        Physical Exam   Vitals reviewed.  Constitutional: She appears well-developed and well-nourished.   HENT:   Right Ear: Tympanic membrane and ear canal normal.   Left Ear: Tympanic membrane and ear canal normal.   Nose: No mucosal edema or rhinorrhea.   Mouth/Throat: Oropharynx is clear and moist and mucous membranes are normal.   Neck: Normal range of motion. Neck supple. No thyromegaly present.   Cardiovascular: Normal rate and regular rhythm.    No murmur heard.  Pulmonary/Chest: Effort normal and breath sounds normal. She has no wheezes. She has no rales.   Abdominal: Soft. Bowel sounds are normal. No tenderness.   Musculoskeletal: Normal range of motion. She exhibits no edema.   Lymphadenopathy:     She has no cervical adenopathy.   Skin: Skin is warm and dry.   Psychiatric: She has a normal mood and affect.       ASSESSMENT and PLAN  Jaedan was seen today for follow-up.    Diagnoses and associated orders for this visit:    Sob (shortness of breath)  ?component of allergy???anxiety  - XR CHEST PA AND LATERAL; Future  - REFERRAL TO PULMONARY  - budesonide-formoterol (SYMBICORT) 160-4.5 mcg/Actuation HFA inhaler; Take 1 Puff by inhalation two (2) times a day.  Htn (hypertension)    Hypercholesterolemia    Gerd (gastroesophageal reflux disease)    Thyroid goiter    Osteoporosis    Encounter for long-term (current) use of other medications        Follow-up Disposition:  Return in about 6 weeks (around 12/14/2009).  reviewed diet, exercise and weight control  cardiovascular risk and specific lipid/LDL goals reviewed  reviewed medications and side effects in detail

## 2009-12-16 ENCOUNTER — Encounter

## 2009-12-21 MED ORDER — ALPRAZOLAM 0.25 MG TAB
0.25 mg | ORAL_TABLET | Freq: Every evening | ORAL | Status: DC | PRN
Start: 2009-12-21 — End: 2010-01-16

## 2009-12-21 NOTE — Telephone Encounter (Signed)
Xanax 0.25mg called into [facility] pharm

## 2009-12-21 NOTE — Telephone Encounter (Signed)
Pt had seen Dr. Modesto Charon and pt stated everything was normal and was told it was anxiety- pt had taken Xanax before and would like a refill

## 2009-12-21 NOTE — Telephone Encounter (Signed)
Change to as needed for sleep.

## 2010-01-06 NOTE — Telephone Encounter (Signed)
Called pt appt 01/16/2010 at 11:45am

## 2010-01-06 NOTE — Telephone Encounter (Signed)
Message copied by Purvis Sheffield on Fri Jan 06, 2010  9:23 AM  ------       Message from: Teresa Pelton       Created: Fri Jan 06, 2010  8:58 AM       Regarding: Jordan-Sayles-re:medication/appt         Dr.Jordan-Sayles-01/06/2010-time-8:59am-dob-Feb 03, 1935                           The patient wants to know if you have samples of valsartan-hydrochlorothiazide (DIOVAN-HCT) 160-25 mg per tablet, and she wants to make a appt for a fu on her bp. Please give patient a return call. Phone-[phone]   djb

## 2010-01-16 MED ORDER — ALPRAZOLAM 0.25 MG TAB
0.25 mg | ORAL_TABLET | Freq: Two times a day (BID) | ORAL | Status: DC | PRN
Start: 2010-01-16 — End: 2010-05-23

## 2010-01-16 MED ORDER — VALSARTAN-HYDROCHLOROTHIAZIDE 160 MG-25 MG TAB
160-25 mg | ORAL_TABLET | Freq: Every day | ORAL | Status: DC
Start: 2010-01-16 — End: 2010-01-16

## 2010-01-16 MED ORDER — DEXLANSOPRAZOLE 60 MG CAPSULE,BIPHASE DELAYED RELEASE
60 mg | ORAL_TABLET | Freq: Every day | ORAL | Status: DC
Start: 2010-01-16 — End: 2011-01-21

## 2010-01-16 MED ORDER — VALSARTAN-HYDROCHLOROTHIAZIDE 160 MG-25 MG TAB
160-25 mg | ORAL_TABLET | ORAL | Status: DC
Start: 2010-01-16 — End: 2010-11-06

## 2010-01-16 MED ORDER — PAROXETINE 20 MG TAB
20 mg | ORAL_TABLET | Freq: Every day | ORAL | Status: DC
Start: 2010-01-16 — End: 2010-05-23

## 2010-01-16 NOTE — Progress Notes (Signed)
Here today for follow up, needs something to help with sleep and anxiety during the day.

## 2010-01-16 NOTE — Progress Notes (Signed)
HISTORY OF PRESENT ILLNESS  Gina Holloway is a 75 y.o. female.  HPI  For follow up.  C/o increased anxiety.  Has been feeling depressed.  Not suicidal.  Trouble sleeping.  Thinks that she has the "holiday blues".  Also c/o left breast pain.  Thinks that she felt a lump on yesterday but can not feel anything on today.  Cardiovascular Review:  The patient has hypertension and hyperlipidemia.  Diet and Lifestyle: generally follows a low fat low cholesterol diet, generally follows a low sodium diet  Home BP Monitoring: is not measured at home.  Pertinent ROS: taking medications as instructed, no medication side effects noted, no TIA's, no chest pain on exertion, no dyspnea on exertion, no swelling of ankles.   Patient Active Problem List   Diagnoses Code   ??? HTN (Hypertension) 401.9AF   ??? Hypercholesterolemia 272.0L   ??? GERD (Gastroesophageal Reflux Disease) 530.81S   ??? Thyroid Goiter 240.9CD   ??? Osteoporosis 733.00C   ??? Encounter for long-term (current) use of other medications V58.69       Current outpatient prescriptions   Medication Sig Dispense Refill   ??? PARoxetine (PAXIL) 20 mg tablet Take 1 Tab by mouth daily.  30 Tab  6   ??? ALPRAZolam (XANAX) 0.25 mg tablet Take 1 Tab by mouth two (2) times daily as needed for Sleep and Anxiety.  40 Tab  5   ??? Dexlansoprazole (DEXILANT) 60 mg CpDM Take 1 Tab by mouth daily.  30 Tab  6   ??? atorvastatin (LIPITOR) 40 mg tablet TAKE 1 TABLET BY MOUTH EVERY NIGHT  90 Tab  3   ??? metoprolol-XL (TOPROL-XL) 25 mg XL tablet Take 1 Tab by mouth daily.  90 Tab  3   ??? aspirin 81 mg Tab Take 81 mg by mouth daily.       ??? valsartan-hydrochlorothiazide (DIOVAN HCT) 160-25 mg per tablet TAKE 1 TABLET BY MOUTH DAILY  90 Tab  3       Allergies   Allergen Reactions   ??? Biaxin (Clarithromycin) Unknown (comments)     Pt not sure what type of reaction   ??? Flagyl (Metronidazole) Unknown (comments)     Pt not sure of what type of reaction       Past Medical History   Diagnosis Date    ??? HTN (hypertension) 03/29/2008   ??? Hypercholesterolemia 03/29/2008   ??? GERD (gastroesophageal reflux disease) 03/29/2008   ??? Thyroid goiter 03/29/2008   ??? Osteoporosis 03/29/2008   ??? Arrhythmia    ??? Murmur        Past Surgical History   Procedure Date   ??? Hx cholecystectomy    ??? Hx tubal ligation    ??? Colonoscopy,diagnostic 02/18/2006     Dr Park Breed       Family History   Problem Relation Age of Onset   ??? Heart Disease Sister    ??? Arthritis-osteo Sister        History   Substance Use Topics   ??? Smoking status: Never Smoker    ??? Smokeless tobacco: Never Used   ??? Alcohol Use: No        Review of Systems   Constitutional: Negative for malaise/fatigue.   HENT: Negative for congestion.    Eyes: Negative for blurred vision.   Respiratory: Negative for cough and shortness of breath.    Cardiovascular: Negative for chest pain, palpitations and leg swelling.   Gastrointestinal: Negative for heartburn, abdominal pain and constipation.  Genitourinary: Negative for dysuria, urgency and frequency.   Musculoskeletal: Negative for back pain and joint pain.   Neurological: Negative for dizziness, tingling and headaches.   Endo/Heme/Allergies: Negative for environmental allergies.   Psychiatric/Behavioral: Positive for depression. Negative for suicidal ideas, hallucinations, memory loss and substance abuse. The patient is nervous/anxious and has insomnia.        Physical Exam   Vitals reviewed.  Constitutional: She appears well-developed and well-nourished.   HENT:   Right Ear: Tympanic membrane and ear canal normal.   Left Ear: Tympanic membrane and ear canal normal.   Nose: No mucosal edema or rhinorrhea.   Mouth/Throat: Oropharynx is clear and moist and mucous membranes are normal.   Neck: Normal range of motion. Neck supple. No thyromegaly present.   Cardiovascular: Normal rate and regular rhythm.    No murmur heard.   Pulmonary/Chest: Effort normal and breath sounds normal. Right breast exhibits tenderness. Right breast exhibits no inverted nipple, no mass, no nipple discharge and no skin change. Left breast exhibits no inverted nipple, no mass, no nipple discharge, no skin change and no tenderness. Breasts are symmetrical.   Abdominal: Soft. Bowel sounds are normal. No tenderness.   Musculoskeletal: Normal range of motion. She exhibits no edema.   Lymphadenopathy:     She has no cervical adenopathy.   Skin: Skin is warm and dry.   Psychiatric: Her speech is normal and behavior is normal. Thought content normal. Her mood appears anxious.       ASSESSMENT and PLAN  Gina Holloway was seen today for blood pressure check and anxiety.    Diagnoses and associated orders for this visit:    Htn (hypertension)  - Discontinue: valsartan-hydrochlorothiazide (DIOVAN-HCT) 160-25 mg per tablet; Take 1 Tab by mouth daily.    Depression with anxiety  - PARoxetine (PAXIL) 20 mg tablet; Take 1 Tab by mouth daily.  - ALPRAZolam (XANAX) 0.25 mg tablet; Take 1 Tab by mouth two (2) times daily as needed for Sleep and Anxiety.    Breast pain  - MAM MAMMOGRAM DIGITAL DIAGNOSTIC RIGHT; Future  - US BREAST RIGHT; Future    Gerd (gastroesophageal reflux disease)  - Refill Dexlansoprazole (DEXILANT) 60 mg CpDM; Take 1 Tab by mouth daily.    Hypercholesterolemia    Encounter for long-term (current) use of other medications        Follow-up Disposition:  Return in about 1 month (around 02/16/2010).  reviewed diet, exercise and weight control  cardiovascular risk and specific lipid/LDL goals reviewed  reviewed medications and side effects in detail

## 2010-02-07 NOTE — Telephone Encounter (Signed)
Ref to [employer].  Please call her tomorrow and see how soon pt can get in.

## 2010-02-07 NOTE — Telephone Encounter (Signed)
States Paxil not helping - continues to having crying spells- would like to be seen as soon as possible. Please look at schedule

## 2010-02-07 NOTE — Telephone Encounter (Signed)
Message copied by Purvis Sheffield on Tue Feb 07, 2010  3:51 PM  ------       Message from: Margarette Asal       Created: Tue Feb 07, 2010  3:29 PM       Regarding: Jordan-Sayles / appt         Dr.Jordan-Sayles  02/07/2010  3:30pm              Patient: Gina Holloway (1934/08/12)              Patient is requesting to be seen ASAP for anxiety. Patient has been taking the Paxil 20 mg but that is not working at all. Patient's relative Andria Rhein) states she just sits and cries all the time and it takes a while for her to compose herself. Please call back to advise.all              Lucy Lucas-relative [phone]

## 2010-02-08 ENCOUNTER — Encounter

## 2010-02-08 NOTE — Telephone Encounter (Signed)
Called pt and informed pt appt has been made to see a counselor- pt wanted the information given to her cousin, Andria Rhein, [phone].    Spoke with Andria Rhein - pt has an appt to see Filbert Schilder at [facility] counseling on 02/09/10 at 2pm- Ms. Samuel Bouche verbalized understanding

## 2010-03-07 NOTE — Progress Notes (Signed)
Chief Complaint   Patient presents with   ??? Follow-up

## 2010-03-07 NOTE — Progress Notes (Signed)
HISTORY OF PRESENT ILLNESS  Gina Holloway is a 76 y.o. female.  HPI for follow up on starting the Paxil. Has helped with mood and anxiety.  Overall feeling better.  Sleeping better.    Patient Active Problem List   Diagnoses Code   ??? HTN (Hypertension) 401.9AF   ??? Hypercholesterolemia 272.0L   ??? GERD (Gastroesophageal Reflux Disease) 530.81S   ??? Thyroid Goiter 240.9CD   ??? Osteoporosis 733.00C   ??? Encounter for long-term (current) use of other medications V58.69       Current outpatient prescriptions   Medication Sig Dispense Refill   ??? PARoxetine (PAXIL) 20 mg tablet Take 1 Tab by mouth daily.  30 Tab  6   ??? ALPRAZolam (XANAX) 0.25 mg tablet Take 1 Tab by mouth two (2) times daily as needed for Sleep and Anxiety.  40 Tab  5   ??? Dexlansoprazole (DEXILANT) 60 mg CpDM Take 1 Tab by mouth daily.  30 Tab  6   ??? valsartan-hydrochlorothiazide (DIOVAN HCT) 160-25 mg per tablet TAKE 1 TABLET BY MOUTH DAILY  90 Tab  3   ??? atorvastatin (LIPITOR) 40 mg tablet TAKE 1 TABLET BY MOUTH EVERY NIGHT  90 Tab  3   ??? metoprolol-XL (TOPROL-XL) 25 mg XL tablet Take 1 Tab by mouth daily.  90 Tab  3   ??? aspirin 81 mg Tab Take 81 mg by mouth daily.           Allergies   Allergen Reactions   ??? Biaxin (Clarithromycin) Unknown (comments)     Pt not sure what type of reaction   ??? Flagyl (Metronidazole) Unknown (comments)     Pt not sure of what type of reaction       Past Medical History   Diagnosis Date   ??? HTN (hypertension) 03/29/2008   ??? Hypercholesterolemia 03/29/2008   ??? GERD (gastroesophageal reflux disease) 03/29/2008   ??? Thyroid goiter 03/29/2008   ??? Osteoporosis 03/29/2008   ??? Arrhythmia    ??? Murmur        Past Surgical History   Procedure Date   ??? Hx cholecystectomy    ??? Hx tubal ligation    ??? Colonoscopy,diagnostic 02/18/2006     Dr Park Breed       Family History   Problem Relation Age of Onset   ??? Heart Disease Sister    ??? Arthritis-osteo Sister        History   Substance Use Topics   ??? Smoking status: Never Smoker     ??? Smokeless tobacco: Never Used   ??? Alcohol Use: No        Review of Systems   Constitutional: Negative for malaise/fatigue.   HENT: Negative for congestion.    Eyes: Negative for blurred vision.   Respiratory: Negative for cough and shortness of breath.    Cardiovascular: Negative for chest pain, palpitations and leg swelling.   Gastrointestinal: Negative for heartburn, abdominal pain and constipation.   Genitourinary: Negative for dysuria, urgency and frequency.   Musculoskeletal: Negative for back pain and joint pain.   Neurological: Negative for dizziness, tingling and headaches.   Endo/Heme/Allergies: Negative for environmental allergies.   Psychiatric/Behavioral: Negative for depression. The patient does not have insomnia.        Physical Exam   Vitals reviewed.  Constitutional: She appears well-developed and well-nourished.   HENT:   Right Ear: Tympanic membrane and ear canal normal.   Left Ear: Tympanic membrane and ear canal normal.  Nose: No mucosal edema or rhinorrhea.   Mouth/Throat: Oropharynx is clear and moist and mucous membranes are normal.   Neck: Normal range of motion. Neck supple. No thyromegaly present.   Cardiovascular: Normal rate and regular rhythm.    No murmur heard.  Pulmonary/Chest: Effort normal and breath sounds normal.   Abdominal: Soft. Bowel sounds are normal. No tenderness.   Musculoskeletal: Normal range of motion. She exhibits no edema.   Lymphadenopathy:     She has no cervical adenopathy.   Skin: Skin is warm and dry.   Psychiatric: She has a normal mood and affect. Her behavior is normal. Judgment and thought content normal.       ASSESSMENT and PLAN  Gina Holloway was seen today for follow-up.    Diagnoses and associated orders for this visit:    Depression with anxiety    Htn (hypertension)    Hypercholesterolemia    Gerd (gastroesophageal reflux disease)    Thyroid goiter    Osteoporosis    Encounter for long-term (current) use of other medications        Follow-up Disposition:   Return in about 4 months (around 07/06/2010).  current treatment plan is effective, no change in therapy  reviewed diet, exercise and weight control  cardiovascular risk and specific lipid/LDL goals reviewed

## 2010-04-07 NOTE — Telephone Encounter (Signed)
Message copied by Purvis Sheffield on Fri Apr 07, 2010  8:30 AM  ------       Message from: Marcy Siren D       Created: Thu Apr 06, 2010  7:57 PM       Regarding: FW: Jordan-Sayles / medication management         Please have pt to cut paxil in half and take in the evening       ----- Message -----          From: Ahmed Prima          Sent: 04/06/2010   3:26 PM            To: Paschal Dopp, MD       Subject: Annell GreeningJoesph July / medication management                                ----- Message -----          From: Margarette Asal          Sent: 04/06/2010   2:52 PM            To: [facility] Nurse Pool       Subject: Jordan-Sayles / medication management                      Dr. Joesph July  04/06/2010   2:53pm              Patient: Gina Holloway ([birth year])              Patient states the medication PARoxetine (PAXIL) 20 mg tablet is making her very drowsy. She wants to know if there is a different RX that can be prescribed or can they lower the dose so she's not so drowsy all the time. Please call back to advise.all              Best # to contact patient: [phone] (H)

## 2010-04-07 NOTE — Telephone Encounter (Signed)
Called pt and informed pt to cut Paxil in half and take in the evening per Dr. Swaziland- Pt verbalized understanding

## 2010-04-14 NOTE — Telephone Encounter (Signed)
Called pt appt 04/17/10 at 10:30am

## 2010-04-14 NOTE — Telephone Encounter (Signed)
Message copied by Purvis Sheffield on Fri Apr 14, 2010  4:48 PM  ------       Message from: Buckman, [place] P       Created: Fri Apr 14, 2010  4:20 PM       Regarding: Gina Holloway         Patient states she has to go the er for a anxiety attack and wants to talk to dr. Joesph July  Thanks              Patient phone number [phone]

## 2010-04-17 NOTE — Telephone Encounter (Signed)
Message copied by Purvis Sheffield on Mon Apr 17, 2010  8:25 AM  ------       Message from: Natasha Bence P       Created: Mon Apr 17, 2010  8:00 AM       Regarding: jordan-sayles         All Cases for RILY, NICKEY #[id number]        Case ID  Patientid  Status  Priority  Type  Assignedto  Created  Createdby  Description        view/update  [id number]  [phone]  OPEN  2  [facility]_Nurse Message  [email or web address]  04/14/2010  nmclean  [facility] 04/14/10 5:07pm nmclean DOB 06-Aug-1934 [phone] Dr.Jordan-Sayles Pt would like a return phone call from the nurse. Pt has an important question she would like to ask thanks

## 2010-04-17 NOTE — Telephone Encounter (Signed)
Pt states she has an appt at [facility] on this Thursday and wants to wait until after that to see Dr. Riki Rusk stated she will call back to make appt

## 2010-04-21 NOTE — Telephone Encounter (Signed)
Message copied by Purvis Sheffield on Fri Apr 21, 2010  2:21 PM  ------       Message from: Margarette Asal       Created: Fri Apr 21, 2010 11:03 AM       Regarding: Gina Holloway / appt         All Cases for CALLA, WEDEKIND #[id number]        Case ID  Patientid  Status  Priority  Type  Assignedto  Created  Createdby  Description        view/update  [id number]  [phone]  OPEN  2  [facility]_Acute Visit-No Appt Available  dbrockrath  04/21/2010  [email or web address]  Dr Gina Holloway dob 03-18-2034 10:51 best number to reach pt [phone] Pt was seen in the ER on 3/15 for stress test and the results was being faxed to Dr. Swaziland. Pt advised she was told to follow up with Dr. Swaziland.

## 2010-04-21 NOTE — Telephone Encounter (Signed)
Pt went to ER ([facility]) 04/20/10 for stress and also had a stress test- pt would like a f/u appt-pt would alo like to discuss some of her medications- please look at schedule

## 2010-04-24 NOTE — Telephone Encounter (Signed)
Message copied by Baruch Goldmann on Mon Apr 24, 2010 11:16 AM  ------       Message from: Natasha Bence P       Created: Mon Apr 24, 2010  9:07 AM       Regarding: jordan-sayles         Patients daughter Ms. Small states she would like to talk to someone asap about her mothers results and she is still having problems with the same problem she was at the er for. Please call daughter back thanks              Ms Small [phone]

## 2010-04-24 NOTE — Telephone Encounter (Signed)
Spoke with Britta Mccreedy at [facility] and pt has an appt with Dr. Vita Barley 04/25/10 at 2pm- called pt's daughter and informed of appt with Dr. Vita Barley- pt verbalized understanding

## 2010-04-24 NOTE — Telephone Encounter (Signed)
Called and spoke with daughter Ms. Small, She states that she is very upset in trying to get a return phone call back in regards to her mothers having gone to [facility] twice last week and wants test results and medication information for her mother. I explained that because [facility] is not [facility] with [facility], we would have to request results or either wait for them to be forwarded to [facility]. Advised Ms. Smalls that if her mother was having any type of chest pain or discomfort then she needed to stop and call 911 and to advise the EMT's to take her to a [place] facility so we can follow her on EMR. Talked with Dr. Joesph July and Camelia Eng LPN about patient case. Dr Joesph July stated that she has already spoken to the NP at [facility], and that patient should be following up with Dr. Vita Barley [facility] cardiology). Called the office of Dr. Vita Barley and made appt for Monday 05/01/2010 @ 1015AM. Jeanene Erb Ms. Small back and notified her of appt and advised her again that if her mother was having any chest pains to call 911 and go to a [place] facility. She verbalized understanding.

## 2010-04-25 MED ORDER — METOPROLOL SUCCINATE SR 25 MG 24 HR TAB
25 mg | ORAL_TABLET | Freq: Every day | ORAL | Status: DC
Start: 2010-04-25 — End: 2010-04-25

## 2010-04-25 MED ORDER — METOPROLOL SUCCINATE SR 25 MG 24 HR TAB
25 mg | ORAL_TABLET | Freq: Every day | ORAL | Status: DC
Start: 2010-04-25 — End: 2010-10-16

## 2010-04-25 NOTE — Progress Notes (Signed)
Tomma Rakers NP  Subjective/HPI:     Gina Holloway is a 76 y.o. female is here for routine f/u.  Pt was seen at [facility] for palpitations and rapid heart rate discharged from [facility] and had outpatient stress test as results are pending.The patient denies chest pain/ shortness of breath, orthopnea, PND, LE edema, syncope, presyncope or fatigue.   Seen by multiple providers dx with anxiety placed on SSRI and PRN Xanax. We have seen patient prior for palpitations and rapid heart beats with essentially normal work up and negative holter/event monitors.    There are no active problems to display for this patient.     Paschal Dopp, MD  No past medical history on file.   No past surgical history on file.  No Known Allergies   No family history on file.   Current Outpatient Prescriptions   Medication Sig   ??? PARoxetine (PAXIL) 20 mg tablet Take 20 mg by mouth daily.     ??? atorvastatin (LIPITOR) 40 mg tablet Take  by mouth daily.     ??? valsartan-hydrochlorothiazide (DIOVAN HCT) 160-25 mg per tablet Take 1 Tab by mouth daily.     ??? aspirin 81 mg tablet Take 81 mg by mouth.     ??? metoprolol (LOPRESSOR) 25 mg tablet Take 25 mg by mouth daily.     ??? b complex vitamins (B COMPLEX 1) tablet Take 1 Tab by mouth daily.     ??? ALPRAZolam (XANAX) 0.25 mg tablet Take  by mouth two (2) times daily as needed.        Filed Vitals:    04/25/10 1324 04/25/10 1331   BP: 130/70 130/60   Pulse: 78    Resp: 18    Height: 5\' 5"  (1.651 m)    Weight: 149 lb 11.2 oz (67.903 kg)        I have reviewed the nurses notes, vitals, problem list, allergy list, medical history, family, social history and medications.    Review of Symptoms:    General: Pt denies excessive weight gain or loss. Pt is able to conduct ADL's  HEENT: Denies blurred vision, headaches, epistaxis and difficulty swallowing.  Respiratory: Denies shortness of breath, DOE, wheezing or stridor.  Cardiovascular: Denies precordial pain, palpitations, edema or PND   Gastrointestinal: Denies poor appetite, indigestion, abdominal pain or blood in stool  Musculoskeletal: Denies pain or swelling from muscles or joints  Neurologic: Denies tremor, paresthesias, or sensory motor disturbance  Skin: Denies rash, itching or texture change.      Physical Exam: ??    General: Well developed, in no acute distress, cooperative and alert  HEENT: No carotid bruits, no JVD, trach is midline. Neck Supple, PEERL, EOM intact.  Heart: ??Normal S1/S2 negative S3 or S4. Regular, no murmur, gallop or rub.??  Respiratory: Clear bilaterally x 4, no wheezing or rales  Abdomen:?? ??Soft, non-tender, no masses, bowel sounds are active.??  Extremities:  No edema, normal cap refill, no cyanosis, atraumatic.   Neuro: A&Ox3, speech clear, gait stable.   Skin: Skin color is normal. No rashes or lesions. Non diaphoretic  Vascular: 2+ pulses symmetric in all extremities    Cardiographics    ECG: Sinus  Rhythm, LVH with strain.      Cardiology Labs:  No results found for this basename: CHOL, [id number], CHOLX, CHOLP, CHLST, CHOLV, [id number], HDL, [id number], HDLC, HDLP, LDL, DLDL, LDLC, DLDLP, TGL, TGLX, TRIGL, [id number], TRIGP, CHHD, CHHDX       No results  found for this basename: NA, K, CL, CO2, AGAP, GLU, BUN, CREA, BUCR, GFRAA, GFRNA, CA, TBIL, GPT, SGOT, AP, TP, ALB, GLOB, AGRAT, CREAP, BUNPL, GLUC, CAPL, TBILP, ALT, TPP, ALBP           Assessment:     Assessment:     1. Palpitations  2. HTN  3. Anxiety     Plan:     Patient presents doing well and is stable from cardiac stand point. I have reviewed her NST from [facility] and is negative for ischemia. I will increase toprol to 50mg  QD--25 mgs BID. Discussed LOOP monitor with patient and prefers not to have it placed at this time. Agrees to EM. Continue current care and f/u in 1 month. Will likely need EP referral. Have advised to stop antianxiety meds as she is now experiencing memory loss, confusion,fatigue,etc. Discussed with daughter who is with her.    MARC A ARNOLD, NP     Gina Holloway L. Gina Gunnarson,MD

## 2010-04-25 NOTE — Patient Instructions (Addendum)
MyChart Activation    Thank you for requesting access to MyChart. Please follow the instructions below to securely access and download your online medical record. MyChart allows you to send messages to your doctor, view your test results, renew your prescriptions, schedule appointments, and more.    How Do I Sign Up?    1. In your internet browser, go to [email or web address].  2. Click on the First Time User? Click Here link in the Sign In box. You will see the New Member Sign Up page.  3. Enter your MyChart Access Code exactly as it appears below. You will not need to use this code after you???ve completed the sign-up process. If you do not sign up before the expiration date, you must request a new code.    MyChart Access Code: [email or web address]  Expires: 07/24/2010  1:18 PM (This is the date your MyChart access code will expire)    4. Enter the last four digits of your Social Security Number (xxxx) and Date of Birth (mm/dd/yyyy) as indicated and click Submit. You will be taken to the next sign-up page.  5. Create a MyChart ID. This will be your MyChart login ID and cannot be changed, so think of one that is secure and easy to remember.  6. Create a MyChart password. You can change your password at any time.  7. Enter your Password Reset Question and Answer. This can be used at a later time if you forget your password.   8. Enter your e-mail address. You will receive e-mail notification when new information is available in MyChart.  9. Click Sign Up. You can now view and download portions of your medical record.  10. Click the Download Summary menu link to download a portable copy of your medical information.    Additional Information    If you have questions, please call [phone]. Remember, MyChart is NOT to be used for urgent needs. For medical emergencies, dial 911.    Palpitations: After Your Visit  Your Care Instructions   Heart palpitations are the uncomfortable sensation that your heart is beating fast or irregularly. You might feel pounding or fluttering in your chest. It might feel like your heart is skipping a beat.  Although palpitations may be caused by a heart problem, they also occur because of stress, fatigue, or use of alcohol, caffeine, or nicotine. Many medicines, including diet pills, antihistamines, decongestants, and some herbal products, can cause heart palpitations. Nearly everyone has palpitations from time to time.  Depending on your symptoms, your doctor may need to do more tests to try to find the cause of your palpitations.  Follow-up care is a key part of your treatment and safety. Be sure to make and go to all appointments, and call your doctor if you are having problems. It???s also a good idea to know your test results and keep a list of the medicines you take.  How can you care for yourself at home?  ?? Avoid caffeine, nicotine, and excess alcohol.   ?? Do not take illegal drugs, such as methamphetamines and cocaine.   ?? Do not take weight loss or diet medicines unless you talk with your doctor first.   ?? Get plenty of sleep.   ?? Do not overeat.   ?? If you have palpitations again, take deep breaths and try to relax. This may slow a racing heart.   ?? If you start to feel lightheaded, lie down to avoid injuries that might result if you  pass out and fall down.   ?? Keep a record of your palpitations and bring it to your next doctor's appointment. Write down:   ?? The date and time.   ?? Your pulse. (If your heart is beating fast, it may be hard to count your pulse.)   ?? What you were doing when the palpitations started.   ?? How long the palpitations lasted.   ?? Any other symptoms.   ?? If an activity causes palpitations, slow down or stop. Talk to your doctor before you do that activity again.   ?? Take your medicines exactly as prescribed. Call your doctor if you think you are having a problem with your medicine.    When should you call for help?  Call 911 anytime you think you may need emergency care. For example, call if:  ?? You passed out (lost consciousness).   ?? You have symptoms of a heart attack. These may include:   ?? Chest pain or pressure, or a strange feeling in the chest.   ?? Sweating.   ?? Shortness of breath.   ?? Pain, pressure, or a strange feeling in the back, neck, jaw, or upper belly or in one or both shoulders or arms.   ?? Lightheadedness or sudden weakness.   ?? A fast or irregular heartbeat.   After you call 911, the operator may tell you to chew 1 adult-strength or 2 to 4 low-dose aspirin. Wait for an ambulance. Do not try to drive yourself.  ?? You have signs of a stroke. These may include:   ?? Sudden numbness, paralysis, or weakness in your face, arm, or leg, especially on only one side of your body.   ?? New problems with walking or balance.   ?? Sudden vision changes.   ?? Drooling or slurred speech.   ?? New problems speaking or understanding simple statements, or feeling confused.   ?? A sudden, severe headache that is different from past headaches.   Call your doctor now or seek immediate medical care if:  ?? You have heart palpitations and:   ?? Are dizzy or lightheaded, or you feel like you may faint.   ?? Have new or increased shortness of breath.   Watch closely for changes in your health, and be sure to contact your doctor if:  ?? You continue to have heart palpitations.     Where can you learn more?    Go to [email or web address]   Enter R508 in the search box to learn more about "Palpitations: After Your Visit."     ?? 2006-2012 [employer]. Care instructions adapted under license by [employer] (which disclaims liability or warranty for this information). This care instruction is for use with your licensed healthcare professional. If you have questions about a medical condition or this instruction, always ask your healthcare professional. [place] any warranty or liability for your use of this information.  Content Version: 9.2.102713; Last Revised: May 27, 2009

## 2010-04-25 NOTE — Telephone Encounter (Signed)
Spoke with Lanora Manis at [facility] that have not received stress test results from [facility]- Lanora Manis will inform nurse

## 2010-04-25 NOTE — Telephone Encounter (Signed)
Message copied by Purvis Sheffield on Tue Apr 25, 2010  2:20 PM  ------       Message from: Toy Baker       Created: Tue Apr 25, 2010  1:52 PM       Regarding: Jordan-Sayles / office notes & labs faxed to [facility]         Dr.Jordan-Sayles  04/25/2010   1:53pm              Patient: Gina Holloway [facility]04/26/1936[facility] is requesting to have the patient's most recent labs and office notes faxed to their office ASAP. They were looking for the stress test & notes from [facility] but I informed them that our office had not received them yet.all              [facility] office# [phone]                                             Fax# [phone]

## 2010-04-26 NOTE — Telephone Encounter (Signed)
PLS call pt re: paperwork we were to have received from [facility].  THX

## 2010-05-01 NOTE — Telephone Encounter (Signed)
[name], Novella Olive, called.  She since you took her mom off of the 2 medications that Dr. Joesph July and prescribed for anxiety and depression, she has had 2 really bad anxiety attacks.  She is wondering if you could give her something for this.  Her sister takes something Qualaft or Xalaft (she was not sure how to say or spell it).  Pls give daughter a call.

## 2010-05-02 NOTE — Telephone Encounter (Signed)
Phone call from [employer] daughter stating that her mom is having Anxiety attacks and wants to know if we could place her something else for her Anxiety. Advised daughter to call Dr. Imagene Riches office and explain to her why Dr. Mare Ferrari stopped Anxioety meds. Because of memory loss and confusion and she could address that issue and decide what route she wants to take. Daughter verbalized understanding.

## 2010-05-02 NOTE — Telephone Encounter (Signed)
Spoke to [name] last week and informed her that we had received her mom's stress test from [facility] while she was here in the office.

## 2010-05-04 NOTE — Telephone Encounter (Signed)
Ms. Dolphus Jenny (daughter) called and is requesting a call back from Dr. Joesph July about anxiety medication being discontinued by Dr. Vita Barley. She states that patient has been having anxiety attacks and would like to speak with you about getting a new anxiety medication or a alternative to what was discontinued. Ms. Dolphus Jenny can be reached at [phone]

## 2010-05-09 NOTE — Telephone Encounter (Signed)
Please ask pt to see me on 4/17 at 2pm.  Please call pt and put on the schedule.  Let her know that i am on vacation for the next week.

## 2010-05-10 NOTE — Telephone Encounter (Signed)
Called pt's daughter- appt 05/23/10 at 2pm will be soonest Dr. Swazil[place] can see -next week Dr. Swaziland is on vacation- daughter was fine with date and time of appt

## 2010-05-23 MED ORDER — SERTRALINE 25 MG TAB
25 mg | ORAL_TABLET | Freq: Every day | ORAL | Status: DC
Start: 2010-05-23 — End: 2010-06-20

## 2010-05-23 NOTE — Progress Notes (Signed)
HISTORY OF PRESENT ILLNESS  Gina Holloway is a 76 y.o. female.  HPI For follow up on anxiety.  Cardiology stopped Paxil and Xanax because of drowsiness but pt feels that she needs something to help with anxiety and depressed mood.  Has intermittent crying spells.  Has ongoing family issues with her children that seem to trigger her sx.  Sleeps well on most night.  Not suicidal.  Increase anxiety cause palpitations.    Patient Active Problem List   Diagnoses Code   ??? HTN (Hypertension) 401.9   ??? GERD (Gastroesophageal Reflux Disease) 530.81   ??? Thyroid Goiter 240.9   ??? Osteoporosis 733.00   ??? Encounter for long-term (current) use of other medications V58.69   ??? Palpitations 785.1   ??? Mixed hyperlipidemia 272.2   ??? Anxiety 300.00     Current Outpatient Prescriptions   Medication Sig Dispense Refill   ??? metoprolol-XL (TOPROL-XL) 25 mg XL tablet Take 1 Tab by mouth two (2) times a day.  90 Tab  3   ??? sertraline (ZOLOFT) 25 mg tablet Take 1 Tab by mouth daily.  30 Tab  3   ??? Dexlansoprazole (DEXILANT) 60 mg CpDM Take 1 Tab by mouth daily.  30 Tab  6   ??? valsartan-hydrochlorothiazide (DIOVAN HCT) 160-25 mg per tablet TAKE 1 TABLET BY MOUTH DAILY  90 Tab  3   ??? atorvastatin (LIPITOR) 40 mg tablet TAKE 1 TABLET BY MOUTH EVERY NIGHT  90 Tab  3   ??? aspirin 81 mg Tab Take 81 mg by mouth daily.         Allergies   Allergen Reactions   ??? Biaxin (Clarithromycin) Unknown (comments)     Pt not sure what type of reaction   ??? Flagyl (Metronidazole) Unknown (comments)     Pt not sure of what type of reaction     Past Medical History   Diagnosis Date   ??? HTN (hypertension) 03/29/2008   ??? Hypercholesterolemia 03/29/2008   ??? GERD (gastroesophageal reflux disease) 03/29/2008   ??? Thyroid goiter 03/29/2008   ??? Osteoporosis 03/29/2008   ??? Arrhythmia    ??? Murmur    ??? Other chest pain    ??? Reflux esophagitis    ??? Anxiety      Past Surgical History   Procedure Date   ??? Hx cholecystectomy    ??? Hx tubal ligation     ??? Colonoscopy,diagnostic 02/18/2006     Dr Park Breed     Family History   Problem Relation Age of Onset   ??? Heart Disease Sister    ??? Arthritis-osteo Sister      History   Substance Use Topics   ??? Smoking status: Never Smoker    ??? Smokeless tobacco: Never Used   ??? Alcohol Use: No      Review of Systems   Constitutional: Negative for malaise/fatigue.   HENT: Negative for congestion.    Eyes: Negative for blurred vision.   Respiratory: Negative for cough and shortness of breath.    Cardiovascular: Negative for chest pain, palpitations and leg swelling.   Gastrointestinal: Negative for heartburn, abdominal pain and constipation.   Genitourinary: Negative for dysuria, urgency and frequency.   Musculoskeletal: Negative for back pain and joint pain.   Neurological: Negative for dizziness, tingling and headaches.   Endo/Heme/Allergies: Negative for environmental allergies.   Psychiatric/Behavioral: Positive for depression. Negative for suicidal ideas, memory loss and substance abuse. The patient is nervous/anxious. The patient does not have insomnia.  Physical Exam   Vitals reviewed.  Constitutional: She appears well-developed and well-nourished.   HENT:   Right Ear: Tympanic membrane and ear canal normal.   Left Ear: Tympanic membrane and ear canal normal.   Nose: No mucosal edema or rhinorrhea.   Mouth/Throat: Oropharynx is clear and moist and mucous membranes are normal.   Neck: Normal range of motion. Neck supple. No thyromegaly present.   Cardiovascular: Normal rate and regular rhythm.    No murmur heard.  Pulmonary/Chest: Effort normal and breath sounds normal.   Abdominal: Soft. Bowel sounds are normal. There is no tenderness.   Musculoskeletal: Normal range of motion. She exhibits no edema.   Lymphadenopathy:     She has no cervical adenopathy.   Skin: Skin is warm and dry.   Psychiatric: Her speech is normal and behavior is normal. Judgment and thought content normal. She exhibits a depressed mood.        ASSESSMENT and PLAN  [name] was seen today for anxiety.    Diagnoses and associated orders for this visit:    Depression with anxiety  - REFERRAL TO PSYCHIATRY  - sertraline (ZOLOFT) 25 mg tablet; Take 1 Tab by mouth daily.    Htn (hypertension)  - metoprolol-XL (TOPROL-XL) 25 mg XL tablet; Take 1 Tab by mouth two (2) times a day.    Mixed hyperlipidemia  - LIPID PANEL    Gerd (gastroesophageal reflux disease)    Thyroid goiter  - TSH, 3RD GENERATION    Palpitations  - metoprolol-XL (TOPROL-XL) 25 mg XL tablet; Take 1 Tab by mouth two (2) times a day.    Encounter for long-term (current) use of other medications  - METABOLIC PANEL, COMPREHENSIVE  - CBC W/O DIFF  - HEMOGLOBIN A1C        Follow-up Disposition:  Return in about 6 weeks (around 07/04/2010).  reviewed diet, exercise and weight control  cardiovascular risk and specific lipid/LDL goals reviewed  reviewed medications and side effects in detail

## 2010-05-24 LAB — CBC W/O DIFF
HCT: 37.3 % (ref 34.0–44.0)
HGB: 12.3 g/dL (ref 11.5–15.0)
MCH: 29.2 pg (ref 27.0–34.0)
MCHC: 33 g/dL (ref 32.0–36.0)
MCV: 89 fL (ref 80–98)
PLATELET: 215 10*3/uL (ref 140–415)
RBC: 4.21 x10E6/uL (ref 3.80–5.10)
RDW: 13 % (ref 11.7–15.0)
WBC: 3.8 10*3/uL — ABNORMAL LOW (ref 4.0–10.5)

## 2010-05-24 LAB — METABOLIC PANEL, COMPREHENSIVE
A-G Ratio: 1.6 (ref 1.1–2.5)
ALT (SGPT): 20 IU/L (ref 0–40)
AST (SGOT): 27 IU/L (ref 0–40)
Albumin: 3.9 g/dL (ref 3.5–4.8)
Alk. phosphatase: 62 IU/L (ref 25–165)
BUN/Creatinine ratio: 24 (ref 11–26)
BUN: 21 mg/dL (ref 8–27)
Bilirubin, total: 0.3 mg/dL (ref 0.0–1.2)
CO2: 25 mmol/L (ref 20–32)
Calcium: 9.2 mg/dL (ref 8.6–10.2)
Chloride: 105 mmol/L (ref 97–108)
Creatinine: 0.88 mg/dL (ref 0.57–1.00)
GFR est AA: 75 mL/min/{1.73_m2} (ref >59)
GFR est non-AA: 65 mL/min/{1.73_m2} (ref >59)
GLOBULIN, TOTAL: 2.5 g/dL (ref 1.5–4.5)
Glucose: 95 mg/dL (ref 65–99)
Potassium: 4.4 mmol/L (ref 3.5–5.2)
Protein, total: 6.4 g/dL (ref 6.0–8.5)
Sodium: 142 mmol/L (ref 134–144)

## 2010-05-24 LAB — LIPID PANEL
Cholesterol, total: 153 mg/dL (ref 100–199)
HDL Cholesterol: 64 mg/dL (ref >39)
LDL, calculated: 58 mg/dL (ref 0–99)
Triglyceride: 155 mg/dL — ABNORMAL HIGH (ref 0–149)
VLDL, calculated: 31 mg/dL (ref 5–40)

## 2010-05-24 LAB — HEMOGLOBIN A1C WITH EAG: Hemoglobin A1c: 6 % — ABNORMAL HIGH (ref 4.8–5.6)

## 2010-05-24 LAB — TSH 3RD GENERATION: TSH: 0.927 u[IU]/mL (ref 0.450–4.500)

## 2010-05-29 NOTE — Progress Notes (Signed)
Harvel Quale, MD 04/25/2010 3:28 PM Signed   Tomma Rakers NP   Subjective/HPI:   Gina Holloway is a 76 y.o. female is here for routine f/u. Pt was seen at [facility] for palpitations and rapid heart rate discharged from [facility] and had outpatient stress test as results are pending.The patient denies chest pain/ shortness of breath, orthopnea, PND, LE edema, syncope, presyncope or fatigue. Seen by multiple providers dx with anxiety placed on SSRI and PRN Xanax. We have seen patient prior for palpitations and rapid heart beats with essentially normal work up and negative holter/event monitors.   There are no active problems to display for this patient.     Paschal Dopp, MD   No past medical history on file.   No past surgical history on file.   No Known Allergies   No family history on file.   Current Outpatient Prescriptions    Medication  Sig    ???  PARoxetine (PAXIL) 20 mg tablet  Take 20 mg by mouth daily.    ???  atorvastatin (LIPITOR) 40 mg tablet  Take by mouth daily.    ???  valsartan-hydrochlorothiazide (DIOVAN HCT) 160-25 mg per tablet  Take 1 Tab by mouth daily.    ???  aspirin 81 mg tablet  Take 81 mg by mouth.    ???  metoprolol (LOPRESSOR) 25 mg tablet  Take 25 mg by mouth daily.    ???  b complex vitamins (B COMPLEX 1) tablet  Take 1 Tab by mouth daily.    ???  ALPRAZolam (XANAX) 0.25 mg tablet  Take by mouth two (2) times daily as needed.      Filed Vitals:     04/25/10 1324  04/25/10 1331    BP:  130/70  130/60    Pulse:  78     Resp:  18     Height:  5\' 5"  (1.651 m)     Weight:  149 lb 11.2 oz (67.903 kg)       I have reviewed the nurses notes, vitals, problem list, allergy list, medical history, family, social history and medications.   Review of Symptoms:   General: Pt denies excessive weight gain or loss. Pt is able to conduct ADL's   HEENT: Denies blurred vision, headaches, epistaxis and difficulty swallowing.   Respiratory: Denies shortness of breath, DOE, wheezing or stridor.    Cardiovascular: Denies precordial pain, palpitations, edema or PND   Gastrointestinal: Denies poor appetite, indigestion, abdominal pain or blood in stool   Musculoskeletal: Denies pain or swelling from muscles or joints   Neurologic: Denies tremor, paresthesias, or sensory motor disturbance   Skin: Denies rash, itching or texture change.   Physical Exam:   General: Well developed, in no acute distress, cooperative and alert   HEENT: No carotid bruits, no JVD, trach is midline. Neck Supple, PEERL, EOM intact.   Heart: Normal S1/S2 negative S3 or S4. Regular, no murmur, gallop or rub.   Respiratory: Clear bilaterally x 4, no wheezing or rales   Abdomen: Soft, non-tender, no masses, bowel sounds are active.   Extremities: No edema, normal cap refill, no cyanosis, atraumatic.   Neuro: A&Ox3, speech clear, gait stable.   Skin: Skin color is normal. No rashes or lesions. Non diaphoretic   Vascular: 2+ pulses symmetric in all extremities   Cardiographics   ECG: Sinus Rhythm, LVH with strain.   Cardiology Labs:   No results found for this basename: CHOL, [id number], CHOLX, CHOLP,  CHLST, CHOLV, [id number], HDL, [id number], HDLC, HDLP, LDL, DLDL, LDLC, DLDLP, TGL, TGLX, TRIGL, [id number], TRIGP, CHHD, CHHDX      No results found for this basename: NA, K, CL, CO2, AGAP, GLU, BUN, CREA, BUCR, GFRAA, GFRNA, CA, TBIL, GPT, SGOT, AP, TP, ALB, GLOB, AGRAT, CREAP, BUNPL, GLUC, CAPL, TBILP, ALT, TPP, ALBP      Assessment:   Assessment:    1. Palpitations   2. HTN   3. Anxiety   Plan:     Patient presents doing well and is stable from cardiac stand point. I have reviewed her NST from [facility] and is negative for ischemia. I will increase toprol to 50mg  QD--25 mgs BID. Discussed LOOP monitor with patient and prefers not to have it placed at this time. Agrees to EM. Continue current care and f/u in 1 month. Will likely need EP referral. Have advised to stop antianxiety meds as she is now experiencing memory loss, confusion,fatigue,etc. Discussed with daughter who is with her.   MARC A ARNOLD, NP   [place] [employer] L. Darvell Monteforte,MD       +++PATIENT HAS 2 CHART ABOVE NOTE IS LAST VISIT  BELOW THIS IS 05/29/10 FOLLOW UP VISIT              Subjective/HPI:     Gina Holloway is a 76 y.o. female is here for routine f/u.  The patient denies chest pain/ shortness of breath, orthopnea, PND, LE edema, palpitations, syncope, presyncope or fatigue.       Patient Active Problem List   Diagnoses Date Noted   ??? Essential hypertension, benign 05/29/2010   ??? Palpitations 05/23/2010   ??? Mixed hyperlipidemia 05/23/2010   ??? Anxiety    ??? Encounter for long-term (current) use of other medications 04/19/2009   ??? HTN (Hypertension) 03/29/2008   ??? GERD (Gastroesophageal Reflux Disease) 03/29/2008   ??? Thyroid Goiter 03/29/2008   ??? Osteoporosis 03/29/2008      PCP Provider  Paschal Dopp, MD  Past Medical History   Diagnosis Date   ??? HTN (hypertension) 03/29/2008   ??? Hypercholesterolemia 03/29/2008   ??? GERD (gastroesophageal reflux disease) 03/29/2008   ??? Thyroid goiter 03/29/2008   ??? Osteoporosis 03/29/2008   ??? Arrhythmia    ??? Murmur    ??? Other chest pain    ??? Reflux esophagitis    ??? Anxiety       Past Surgical History   Procedure Date   ??? Hx cholecystectomy    ??? Hx tubal ligation    ??? Colonoscopy,diagnostic 02/18/2006     Dr Park Breed     Allergies   Allergen Reactions   ??? Biaxin (Clarithromycin) Unknown (comments)     Pt not sure what type of reaction   ??? Flagyl (Metronidazole) Unknown (comments)     Pt not sure of what type of reaction       Family History   Problem Relation Age of Onset   ??? Heart Disease Sister    ??? Arthritis-osteo Sister       Current Outpatient Prescriptions   Medication Sig   ??? metoprolol-XL (TOPROL-XL) 25 mg XL tablet Take 1 Tab by mouth two (2) times a day.   ??? sertraline (ZOLOFT) 25 mg tablet Take 1 Tab by mouth daily.   ??? Dexlansoprazole (DEXILANT) 60 mg CpDM Take 1 Tab by mouth daily.   ??? valsartan-hydrochlorothiazide (DIOVAN HCT) 160-25 mg per tablet TAKE 1 TABLET BY MOUTH DAILY   ??? atorvastatin (LIPITOR) 40 mg tablet TAKE 1  TABLET BY MOUTH EVERY NIGHT   ??? aspirin 81 mg Tab Take 81 mg by mouth daily.      Filed Vitals:    05/29/10 0945 05/29/10 0951   BP: 120/60 120/60   Pulse: 62    Resp: 18    Height: 5\' 4"  (1.626 m)    Weight: 153 lb 8 oz (69.627 kg)      History     Social History   ??? Marital Status: Divorced     Spouse Name: N/A     Number of Children: N/A   ??? Years of Education: N/A     Occupational History   ??? Not on file.     Social History Main Topics   ??? Smoking status: Never Smoker    ??? Smokeless tobacco: Never Used   ??? Alcohol Use: No   ??? Drug Use: No   ??? Sexually Active: No     Other Topics Concern   ??? Not on file     Social History Narrative   ??? No narrative on file       I have reviewed the nurses notes, vitals, problem list, allergy list, medical history, family, social history and medications.    Review of Symptoms:    General: Pt denies excessive weight gain or loss. Pt is able to conduct ADL's  HEENT: Denies blurred vision, headaches, epistaxis and difficulty swallowing.  Respiratory: Denies shortness of breath, DOE, wheezing or stridor.  Cardiovascular: Denies precordial pain, palpitations, edema or PND  Gastrointestinal: Denies poor appetite, indigestion, abdominal pain or blood in stool  Musculoskeletal: Denies pain or swelling from muscles or joints  Neurologic: Denies tremor, paresthesias, or sensory motor disturbance  Skin: Denies rash, itching or texture change.      Physical Exam: ??     General: Well developed, in no acute distress, cooperative and alert  HEENT: No carotid bruits, no JVD, trach is midline. Neck Supple, PEERL, EOM intact.  Heart: ??Normal S1/S2 negative S3 or S4. Regular, no murmur, gallop or rub.??  Respiratory: Clear bilaterally x 4, no wheezing or rales  Abdomen:?? ??Soft, non-tender, no masses, bowel sounds are active.??  Extremities:  No edema, normal cap refill, no cyanosis, atraumatic.   Neuro: A&Ox3, speech clear, gait stable.   Skin: Skin color is normal. No rashes or lesions. Non diaphoretic  Vascular: 2+ pulses symmetric in all extremities    Cardiographics    ECG: Sinus  Rhythm   -Left atrial enlargement.    -Anteroseptal infarct -age undetermined.   No results found for this or any previous visit.      Cardiology Labs:  Lab Results   Component Value Date/Time    Cholesterol, total 153 05/23/2010  3:01 PM    HDL Cholesterol 64 05/23/2010  3:01 PM    LDL, calculated 58 05/23/2010  3:01 PM    Triglyceride 155 05/23/2010  3:01 PM    CHOL/HDL Ratio 2.6 04/09/2008  3:24 PM       Lab Results   Component Value Date/Time    Sodium 142 05/23/2010  3:01 PM    Potassium 4.4 05/23/2010  3:01 PM    Chloride 105 05/23/2010  3:01 PM    CO2 25 05/23/2010  3:01 PM    Anion gap 9 08/13/2008  2:37 PM    Glucose 95 05/23/2010  3:01 PM    BUN 21 05/23/2010  3:01 PM    Creatinine 0.88 05/23/2010  3:01 PM    BUN/Creatinine ratio 24  05/23/2010  3:01 PM    GFR est AA 75 05/23/2010  3:01 PM    GFR est non-AA 65 05/23/2010  3:01 PM    Calcium 9.2 05/23/2010  3:01 PM    Bilirubin, total 0.3 05/23/2010  3:01 PM    ALT 20 05/23/2010  3:01 PM    AST 27 05/23/2010  3:01 PM    Alk. phosphatase 62 05/23/2010  3:01 PM    Protein, total 6.4 05/23/2010  3:01 PM    Albumin 3.9 05/23/2010  3:01 PM    Globulin 3.2 04/09/2008  3:24 PM    A-G Ratio 1.6 05/23/2010  3:01 PM           Assessment:     Assessment:     Maryrose was seen today for follow-up.    Diagnoses and associated orders for this visit:    Palpitations   - [facility]B POC EKG ROUTINE W/ 12 LEADS, INTER & REP    Essential hypertension, benign    Mixed hyperlipidemia    Gerd (gastroesophageal reflux disease)    Anxiety        1. Essential hypertension, benign  AMB POC EKG ROUTINE W/ 12 LEADS, INTER & REP   2. Mixed hyperlipidemia     3. GERD (gastroesophageal reflux disease)     4. Palpitations     5. Anxiety       Orders Placed This Encounter   ??? AMB POC EKG ROUTINE W/ 12 LEADS, INTER & REP     Order Specific Question:  Reason for Exam:     Answer:  exam        Plan:     Patient presents doing well and is stable from cardiac stand point. Increased dose of toprol significantly improved symptoms. Started on anti-anxiety/depression Rx by Dr. Joesph July.  Continue current care and f/u in 6months.    Agree with note as outlined by Liz Beach. Debroah Loop NP. I confirm findings in history and physical examination. No additional findings noted. Agree with plan as outlined above.      MARC A ARNOLD, NP    [employer] L. Jamarious Febo,MD

## 2010-05-29 NOTE — Patient Instructions (Signed)
Learning About Anxiety Disorders  What are anxiety disorders?  Anxiety disorders are a type of medical condition in which you have severe anxiety that interferes with your life. Anxiety is an uncomfortable feeling of fear, uneasiness, or concern that something bad is about to happen.  Anxiety disorders include conditions such as:  ?? Generalized anxiety disorder. You feel worried and stressed about many everyday events and activities. This goes on for several months and disrupts your life on most days.   ?? Panic disorder. You have repeated panic attacks. A panic attack is a sudden, intense fear or anxiety that may make you short of breath or dizzy or make your heart pound.   ?? Social anxiety disorder. You feel extremely anxious about what you will say or do in front of other people. This problem affects your daily life.   ?? Phobias. You are extremely afraid of a specific object, situation, or activity.   What are the symptoms?  Generalized anxiety disorder  Symptoms may include:  ?? Feeling worried and stressed about many things almost every day.   ?? Physical symptoms such as:   ?? Feeling tired or irritable, or having a hard time concentrating.   ?? Having headaches or muscle aches.   ?? Having a hard time swallowing.   ?? Feeling shaky, sweating, or having hot flashes.   Panic disorder  With panic disorder, you may have repeated panic attacks when there is no reason for feeling afraid. You may change your daily activities because you worry that you will have another attack.  Symptoms of panic attacks may include:  ?? A feeling of intense fear, terror, or anxiety.   ?? Trouble breathing or very fast breathing.   ?? Chest pain or tightness.   ?? A heartbeat that races or is not regular.   Social anxiety disorder  Symptoms may include:  ?? Fear about a social situation, such as eating in front of others or speaking in public. You may worry a lot or be afraid that something bad will happen.    ?? Anxiety that can cause you to blush, sweat, and feel shaky.   ?? A heartbeat that is faster than normal.   ?? Having a hard time focusing.   Phobias  Symptoms may include:  ?? Feeling more afraid than most people of being around an object, being in a situation, or doing an activity. You might also be stressed about the possibility of being around the thing you fear.   ?? Worrying about losing control, panicking, fainting, or having physical symptoms like a faster heartbeat when you are around the situation or object.   How are anxiety disorders treated?  Anxiety disorders can be treated with medicines or counseling, or a combination of both. Medicines may include:  ?? Antidepressants, which may help your symptoms by keeping chemicals in your brain in balance.   ?? Benzodiazepines, which may provide short-term relief of anxiety symptoms.   Counseling may involve cognitive-behavioral therapy. A therapist helps you learn to think positive thoughts instead of ones that make you feel stressed and worried.  Healthy lifestyle  Healthy lifestyle changes may help you feel better.  ?? Get at least 30 minutes of exercise on most days of the week. Walking is a good choice.   ?? Eat a healthy diet. Include fruits, vegetables, lean proteins, and whole grains in your diet each day.   ?? Keep a regular sleep schedule. Try for 8 hours of sleep a night.   ??   Find ways to manage stress, such as relaxation exercises.   ?? Avoid alcohol and illegal drugs.   Follow-up care is a key part of your treatment and safety. Be sure to make and go to all appointments, and call your doctor if you are having problems. It's also a good idea to know your test results and keep a list of the medicines you take.    Where can you learn more?    Go to http://www.healthwise.net/[facility]   Enter K667 in the search box to learn more about "Learning About Anxiety Disorders."     ?? 2006-2012 Healthwise, Incorporated. Care instructions adapted under license by [employer] (which disclaims liability or warranty for this information). This care instruction is for use with your licensed healthcare professional. If you have questions about a medical condition or this instruction, always ask your healthcare professional. Healthwise, Incorporated disclaims any warranty or liability for your use of this information.  Content Version: 9.2.102713; Last Revised: March 10, 2008

## 2010-06-12 NOTE — Telephone Encounter (Signed)
Called pt's daughter can take pt to [facility] for evaluation per Dr. Sw[place]- pt's daughter stated she would take pt tomorrow morning

## 2010-06-12 NOTE — Telephone Encounter (Signed)
[name] states pt is still feeling depressed, crying at times- has tried meds nothing seems to help- daughter states wondering if pt could be admitted to [facility] just to be evaluated 24/7-pt has an appt 06/16/10 and daughter will discuss this with Dr. Swaziland. Daughter wanted Dr. Swaziland to know what was going on

## 2010-06-12 NOTE — Telephone Encounter (Signed)
Go to [facility] for evaluation.

## 2010-06-13 NOTE — Telephone Encounter (Signed)
Spoke to [place] [name] and she states that her mom's shortness of breath is getting worse, wanted to know what it was Dr. Leonard Schwartz, had suggested when she was here in the office. Informed daughter that per Dr. Senaida Lange note that she may need a loop monitor, but advised that pt. Follow up with Dr. Vita Barley as his note states for her to f/up in one month. Scheduled an appt. For pt. To come in Thursday 06/15/10 to see Dr. Leonard Schwartz. And discuss further wit him.

## 2010-06-13 NOTE — Telephone Encounter (Signed)
Pl's call pt daughter Novella Olive [phone] RE: SOB, was discussing a procedure due to her SOB and would like to discuss further. TX!!!

## 2010-06-15 NOTE — Progress Notes (Signed)
EST PT PROBLEM - SOB, IRREG BEAT, FATIGUE

## 2010-06-15 NOTE — Patient Instructions (Addendum)
MyChart Activation    Thank you for requesting access to MyChart. Please follow the instructions below to securely access and download your online medical record. MyChart allows you to send messages to your doctor, view your test results, renew your prescriptions, schedule appointments, and more.    How Do I Sign Up?    1. In your internet browser, go to [email or web address]  2. Click on the First Time User? Click Here link in the Sign In box. You will be redirect to the New Member Sign Up page.  3. Enter your MyChart Access Code exactly as it appears below. You will not need to use this code after you???ve completed the sign-up process. If you do not sign up before the expiration date, you must request a new code.    MyChart Access Code: [email or web address]  Expires: 09/13/2010 11:05 AM (This is the date your MyChart access code will expire)    4. Enter the last four digits of your Social Security Number (xxxx) and Date of Birth (mm/dd/yyyy) as indicated and click Submit. You will be taken to the next sign-up page.  5. Create a MyChart ID. This will be your MyChart login ID and cannot be changed, so think of one that is secure and easy to remember.  6. Create a MyChart password. You can change your password at any time.  7. Enter your Password Reset Question and Answer. This can be used at a later time if you forget your password.   8. Enter your e-mail address. You will receive e-mail notification when new information is available in MyChart.  9. Click Sign Up. You can now view and download portions of your medical record.  10. Click the Download Summary menu link to download a portable copy of your medical information.    Additional Information    Remember, MyChart is NOT to be used for urgent needs. For medical emergencies, dial 911.    Shortness of Breath: After Your Visit  Your Care Instructions   Shortness of breath has many causes. Sometimes conditions such as anxiety can lead to shortness of breath. Some people get mild shortness of breath when they exercise. Trouble breathing also can be a symptom of a serious problem, such as asthma, lung disease, emphysema, heart problems, and pneumonia.  If your shortness of breath continues, you may need tests and treatment. Watch for any changes in your breathing and other symptoms.  Follow-up care is a key part of your treatment and safety. Be sure to make and go to all appointments, and call your doctor if you are having problems. It???s also a good idea to know your test results and keep a list of the medicines you take.  How can you care for yourself at home?  ?? Do not smoke or allow others to smoke around you. If you need help quitting, talk to your doctor about stop-smoking programs and medicines. These can increase your chances of quitting for good.   ?? Get plenty of rest and sleep.   ?? Take your medicines exactly as prescribed. Call your doctor if you think you are having a problem with your medicine.   ?? Find healthy ways to deal with stress.   ?? Exercise daily.   ?? Get plenty of sleep.   ?? Eat regularly and well.   When should you call for help?  Call 911 anytime you think you may need emergency care. For example, call if:  ?? You have severe shortness of breath.   ??  You have symptoms of a heart attack. These may include:   ?? Chest pain or pressure, or a strange feeling in the chest.   ?? Sweating.   ?? Shortness of breath.   ?? Nausea or vomiting.   ?? Pain, pressure, or a strange feeling in the back, neck, jaw, or upper belly or in one or both shoulders or arms.   ?? Lightheadedness or sudden weakness.   ?? A fast or irregular heartbeat.   After you call 911, the operator may tell you to chew 1 adult-strength or 2 to 4 low-dose aspirin. Wait for an ambulance. Do not try to drive yourself.  Call your doctor now or seek immediate medical care if:   ?? Your shortness of breath gets worse or you start to wheeze. Wheezing is a high-pitched sound when you breathe.   ?? You wake up at night out of breath or have to prop your head up on several pillows to breathe.   ?? You are short of breath after only light activity or while at rest.   Watch closely for changes in your health, and be sure to contact your doctor if:  ?? You do not get better over the next 1 to 2 days.     Where can you learn more?    Go to [employer]   Enter S780 in the search box to learn more about "Shortness of Breath: After Your Visit."    ?? 2006-2012 Healthwise, Incorporated. Care instructions adapted under license by [employer] (which disclaims liability or warranty for this information). This care instruction is for use with your licensed healthcare professional. If you have questions about a medical condition or this instruction, always ask your healthcare professional. Healthwise, Incorporated disclaims any warranty or liability for your use of this information.  Content Version: 9.2.102713; Last Revised: May 10, 2009

## 2010-06-15 NOTE — Progress Notes (Signed)
Tomma Rakers NP  Subjective:    Gina Holloway is a 76 y.o. female is here for symptom based appt.  The patient presents with recurrent periods of shortness of breath at rest and exertion, palpitations improved with increased dose of beta blocker.      Patient Active Problem List   Diagnoses Date Noted   ??? Essential hypertension, benign 05/29/2010   ??? Palpitations 05/23/2010   ??? Mixed hyperlipidemia 05/23/2010   ??? Anxiety    ??? Encounter for long-term (current) use of other medications 04/19/2009   ??? HTN (Hypertension) 03/29/2008   ??? GERD (Gastroesophageal Reflux Disease) 03/29/2008   ??? Thyroid Goiter 03/29/2008   ??? Osteoporosis 03/29/2008      Paschal Dopp, MD  Past Medical History   Diagnosis Date   ??? HTN (hypertension) 03/29/2008   ??? Hypercholesterolemia 03/29/2008   ??? GERD (gastroesophageal reflux disease) 03/29/2008   ??? Thyroid goiter 03/29/2008   ??? Osteoporosis 03/29/2008   ??? Arrhythmia    ??? Murmur    ??? Other chest pain    ??? Reflux esophagitis    ??? Anxiety       Past Surgical History   Procedure Date   ??? Hx cholecystectomy    ??? Hx tubal ligation    ??? Colonoscopy,diagnostic 02/18/2006     Dr Park Breed     Allergies   Allergen Reactions   ??? Biaxin (Clarithromycin) Unknown (comments)     Pt not sure what type of reaction   ??? Flagyl (Metronidazole) Unknown (comments)     Pt not sure of what type of reaction      Family History   Problem Relation Age of Onset   ??? Heart Disease Sister    ??? Arthritis-osteo Sister       Current Outpatient Prescriptions   Medication Sig   ??? metoprolol-XL (TOPROL-XL) 25 mg XL tablet Take 1 Tab by mouth two (2) times a day.   ??? sertraline (ZOLOFT) 25 mg tablet Take 1 Tab by mouth daily.   ??? Dexlansoprazole (DEXILANT) 60 mg CpDM Take 1 Tab by mouth daily.   ??? valsartan-hydrochlorothiazide (DIOVAN HCT) 160-25 mg per tablet TAKE 1 TABLET BY MOUTH DAILY   ??? atorvastatin (LIPITOR) 40 mg tablet TAKE 1 TABLET BY MOUTH EVERY NIGHT   ??? aspirin 81 mg Tab Take 81 mg by mouth daily.       Filed Vitals:    06/15/10 1120 06/15/10 1122   BP: 134/50 124/52   Pulse:  80   Resp:  20   Height:  5\' 4"  (1.626 m)   Weight:  149 lb 11.2 oz (67.903 kg)   SpO2:  98%     History     Social History   ??? Marital Status: Divorced     Spouse Name: N/A     Number of Children: N/A   ??? Years of Education: N/A     Occupational History   ??? Not on file.     Social History Main Topics   ??? Smoking status: Never Smoker    ??? Smokeless tobacco: Never Used   ??? Alcohol Use: No   ??? Drug Use: No   ??? Sexually Active: No     Other Topics Concern   ??? Not on file     Social History Narrative   ??? No narrative on file       I have reviewed the nurses notes, vitals, problem list, allergy list, medical history, family medical, social history  and medications.    Review of Symptoms:    General: Pt denies excessive weight gain or loss. Pt is able to conduct ADL's  HEENT: Denies blurred vision, headaches, epistaxis and difficulty swallowing.  Respiratory: + shortness of breath, DOE, wheezing or stridor.  Cardiovascular: Denies precordial pain, palpitations, edema or PND  Gastrointestinal: Denies poor appetite, indigestion, abdominal pain or blood in stool  Musculoskeletal: Denies pain or swelling from muscles or joints  Neurologic: Denies tremor, paresthesias, or sensory motor disturbance  Skin: Denies rash, itching or texture change.      Physical Exam: ??    General: Well developed, in no acute distress.  HEENT: No carotid bruits, no JVD, trach is midline.   Heart: ??Normal S1/S2 negative S3 or S4. Regular, no murmur, gallop or rub.??  Respiratory: Clear bilaterally x 4, no wheezing or rales  Abdomen:?? ??Soft, non-tender, bowel sounds are active.??  Extremities:  No edema, normal cap refill, no cyanosis.  Neuro: A&Ox3, speech clear, gait stable.   Skin: Skin color is normal. No rashes or lesions. Non diaphoretic  Vascular: 2+ pulses symmetric in all extremities      Cardiographics    ECG:   No results found for this or any previous visit.      Labs:  Lab Results   Component Value Date/Time    Sodium 142 05/23/2010  3:01 PM    Potassium 4.4 05/23/2010  3:01 PM    Chloride 105 05/23/2010  3:01 PM    CO2 25 05/23/2010  3:01 PM    Anion gap 9 08/13/2008  2:37 PM    Glucose 95 05/23/2010  3:01 PM    BUN 21 05/23/2010  3:01 PM    Creatinine 0.88 05/23/2010  3:01 PM    BUN/Creatinine ratio 24 05/23/2010  3:01 PM    GFR est AA 75 05/23/2010  3:01 PM    GFR est non-AA 65 05/23/2010  3:01 PM    Calcium 9.2 05/23/2010  3:01 PM    Bilirubin, total 0.3 05/23/2010  3:01 PM    ALT 20 05/23/2010  3:01 PM    AST 27 05/23/2010  3:01 PM    Alk. phosphatase 62 05/23/2010  3:01 PM    Protein, total 6.4 05/23/2010  3:01 PM    Albumin 3.9 05/23/2010  3:01 PM    Globulin 3.2 04/09/2008  3:24 PM    A-G Ratio 1.6 05/23/2010  3:01 PM      Lab Results   Component Value Date/Time    WBC 3.8 05/23/2010  3:01 PM    HGB 12.3 05/23/2010  3:01 PM    HCT 37.3 05/23/2010  3:01 PM    PLATELET 215 05/23/2010  3:01 PM    MCV 89 05/23/2010  3:01 PM    All Cardiac Markers in the last 24 hours:    No results found for this basename: CPK, CK, CPKMB, CKRMB, CKMMB, CKMB, RCK3, CKMBT, CKMBPC, CKSMB, CKNDX, CKND1, MYO, TROQR, TROPT, TROIQ, TROIP, TROI, TROPIT, TROPT, TRPOIT, ITNL, TNIPOC, BNP, BNPP, PBNP                 Assessment:     Assessment:       1. Shortness of breath on exertion  AMB POC EKG ROUTINE W/ 12 LEADS, INTER & REP, STRESS TEST ADENOSINE/MYOVIEW, 2D ECHO COMPLETE ADULT   2. HTN (hypertension)     3. Mixed hyperlipidemia     4. Essential hypertension, benign     5. GERD (gastroesophageal reflux  disease)     6. Palpitations     7. Anxiety         Orders Placed This Encounter   ??? AMB POC EKG ROUTINE W/ 12 LEADS, INTER & REP     Order Specific Question:  Reason for Exam:     Answer:  ROUTINE        Plan:      Patient presents with progressive DOE over short distances over greater than 6 months associated with marked fatigue,lack of energy, palpitations though less on BB. Will exclude atypical [place] with adenosine myoview and assess SEM with echo to exclude significant AS. If suspicious then cardiac cath. F/U post-studies.    Agree with note as outlined by Liz Beach. Debroah Loop NP. I confirm findings in history and physical examination. No additional findings noted. Agree with plan as outlined above.      Albi Rappaport A Ison Wichmann, NP    ARCHER L. [place],MD

## 2010-06-19 NOTE — Telephone Encounter (Signed)
Message copied by Purvis Sheffield on Mon Jun 19, 2010  2:19 PM  ------       Message from: Duffy Bruce       Created: Mon Jun 19, 2010 10:37 AM       Regarding: Joesph July       Contact: [phone]         Patient's daughter, Nettie Elm, called to get more information about symptoms of Thyroid problems.  Nettie Elm also has questions about how to treat Thyroid problems.  Patient has an appointment with Dr. Joesph July on 07-04-10, however, she thinks she needs to be seen sooner.  Please give her a call back today @ [phone] to discuss and advise.  Thank you!

## 2010-06-19 NOTE — Telephone Encounter (Signed)
Message copied by Purvis Sheffield on Mon Jun 19, 2010 11:40 AM  ------       Message from: Natasha Bence P       Created: Mon Jun 19, 2010  9:52 AM       Regarding: jordan-sayles         Patients daughter [name] states patient needs to see dr. Joesph July asap she thinks she has hyper thyroid she has all the symptoms and she is really in a bad place. Please call [name] back asap. Thanks              [name] [phone] work

## 2010-06-19 NOTE — Telephone Encounter (Signed)
Called pt's daughter- appt 06/20/10 at 12pm

## 2010-06-19 NOTE — Telephone Encounter (Signed)
[name] [name]) would like to speak to Dr. [place] prior to pt coming in tomorrow if possible- has questions regarding pt's health and about hyperthyroidism. [name] can be reached at [phone]

## 2010-06-19 NOTE — Telephone Encounter (Signed)
Spoke with pt's daughter -pt fast heart rate, wt loss, being sad, tremors- daughter states pt saw Dr. Vita Barley 06/15/10 and he mentioned could be hyperthyroid. Pt has an appt 07/04/10 but daughter would like pt to be seen this week and if unable to be seen pt will find another MD. Will check with Dr. Swaziland and call back.

## 2010-06-20 NOTE — Progress Notes (Signed)
HISTORY OF PRESENT ILLNESS  Gina Holloway is a 76 y.o. female.  HPI For follow up.  Pt in with daughter.  Has multiple complaints of including crying spells, feeling depressed, weight loss, anxiety sob and palpitations.  Has difficulty sleeping.  Feels nervous inside all the time.  Admits to loneliness when home alone.  Not suicidal.  Has had negative cardiac and pulmonary eval.  Has periods of feeling confused.  Has not tolerated Zoloft because of stomach upset.  Did not tolerate Paxil because thought it was making her feels more confused.  Currently not taking any medications for anxiety or depression.  Had decline referral to psychiatry at last visit but now willing to go.  Daughter is concerned that sx may be related to thyroid though TSH was normal on last month.  Has past hx of thyroid goiter.    Patient Active Problem List   Diagnoses Code   ??? HTN (Hypertension) 401.9   ??? GERD (Gastroesophageal Reflux Disease) 530.81   ??? Thyroid Goiter 240.9   ??? Osteoporosis 733.00   ??? Encounter for long-term (current) use of other medications V58.69   ??? Palpitations 785.1   ??? Mixed hyperlipidemia 272.2   ??? Anxiety 300.00   ??? Essential hypertension, benign 401.1   ??? Shortness of breath on exertion 786.05     Current Outpatient Prescriptions   Medication Sig Dispense Refill   ??? methylsulfonylmethane (MSM) 1,000 mg Cap Take 1 Cap by mouth as needed.         ??? b complex vitamins (B COMPLEX 1) tablet Take 1 Tab by mouth daily.         ??? metoprolol-XL (TOPROL-XL) 25 mg XL tablet Take 2 Tabs by mouth daily.  30 Tab  6   ??? Dexlansoprazole (DEXILANT) 60 mg CpDM Take 1 Tab by mouth daily.  30 Tab  6   ??? valsartan-hydrochlorothiazide (DIOVAN HCT) 160-25 mg per tablet TAKE 1 TABLET BY MOUTH DAILY  90 Tab  3   ??? atorvastatin (LIPITOR) 40 mg tablet TAKE 1 TABLET BY MOUTH EVERY NIGHT  90 Tab  3   ??? aspirin 81 mg Tab Take 81 mg by mouth daily.        ??? DISCONTD: metoprolol-XL (TOPROL-XL) 25 mg XL tablet Take 1 Tab by mouth two (2) times a day.  90 Tab  3     Allergies   Allergen Reactions   ??? Biaxin (Clarithromycin) Unknown (comments)     Pt not sure what type of reaction   ??? Flagyl (Metronidazole) Unknown (comments)     Pt not sure of what type of reaction     Past Medical History   Diagnosis Date   ??? HTN (hypertension) 03/29/2008   ??? Hypercholesterolemia 03/29/2008   ??? GERD (gastroesophageal reflux disease) 03/29/2008   ??? Thyroid goiter 03/29/2008   ??? Osteoporosis 03/29/2008   ??? Arrhythmia    ??? Murmur    ??? Other chest pain    ??? Reflux esophagitis    ??? Anxiety    ??? Essential hypertension      Past Surgical History   Procedure Date   ??? Hx cholecystectomy    ??? Hx tubal ligation    ??? Colonoscopy,diagnostic 02/18/2006     Dr Park Breed     Family History   Problem Relation Age of Onset   ??? Heart Disease Sister    ??? Arthritis-osteo Sister      History   Substance Use Topics   ???  Smoking status: Never Smoker    ??? Smokeless tobacco: Never Used   ??? Alcohol Use: No      Review of Systems   Constitutional: Negative for malaise/fatigue.   HENT: Negative for congestion.    Eyes: Negative for blurred vision.   Respiratory: Negative for cough and shortness of breath.    Cardiovascular: Positive for palpitations (when feeling nervous). Negative for chest pain and leg swelling.   Gastrointestinal: Negative for heartburn, abdominal pain and constipation.   Genitourinary: Negative for dysuria, urgency and frequency.   Musculoskeletal: Negative for back pain and joint pain.   Neurological: Negative for dizziness, tingling and headaches.   Endo/Heme/Allergies: Negative for environmental allergies.   Psychiatric/Behavioral: Positive for depression. Negative for memory loss. The patient is nervous/anxious and has insomnia.        Physical Exam   Vitals reviewed.  Constitutional: She appears well-developed and well-nourished.   HENT:   Right Ear: Tympanic membrane and ear canal normal.    Left Ear: Tympanic membrane and ear canal normal.   Nose: No mucosal edema or rhinorrhea.   Mouth/Throat: Oropharynx is clear and moist and mucous membranes are normal.   Neck: Normal range of motion. Neck supple. No thyromegaly present.   Cardiovascular: Normal rate and regular rhythm.    No murmur heard.  Pulmonary/Chest: Effort normal and breath sounds normal.   Abdominal: Soft. Bowel sounds are normal. There is no tenderness.   Musculoskeletal: Normal range of motion. She exhibits no edema.   Lymphadenopathy:     She has no cervical adenopathy.   Skin: Skin is warm and dry.   Psychiatric: Her speech is normal and behavior is normal. Thought content normal. Her mood appears anxious. She exhibits a depressed mood.       ASSESSMENT and PLAN  Gina Holloway was seen today for shortness of breath, palpitations, fatigue and depression.    Diagnoses and associated orders for this visit:    Depression with anxiety  - REFERRAL TO PSYCHIATRY    Mental confusion  - MRI BRAIN W WO CONT; Future    Thyroid goiter  - T4, FREE  - TSH, 3RD GENERATION  - T3 TOTAL  - REFERRAL TO ENDOCRINOLOGY    Palpitations    Htn (hypertension)    Mixed hyperlipidemia    Gerd (gastroesophageal reflux disease)    Encounter for long-term (current) use of other medications    Other Orders  - methylsulfonylmethane (MSM) 1,000 mg Cap; Take 1 Cap by mouth as needed.          Follow-up Disposition:  Return in about 1 month (around 07/21/2010).

## 2010-06-20 NOTE — Telephone Encounter (Signed)
Message copied by Purvis Sheffield on Tue Jun 20, 2010 11:59 AM  ------       Message from: Toy Baker       Created: Tue Jun 20, 2010  8:23 AM       Regarding: jordan-sayles         Patients daughter states she would like to talk to terri about her mother who has appnt today there are some things she would like to tell terri to help dr Joesph July understand her mothers condition. Please call [name]. Thanks              [name] [phone] before 8:30 after 8:30 call [phone]

## 2010-06-20 NOTE — Progress Notes (Signed)
Patient saw her cardiologist for shortness of breath and palpitations;he ordered a stress test on the patient, but she said she doesn't think she needs one, because she had one at [facility] last month; he also suggested she could have a thyroid problem.    Appointment with Dr. Lenard Lance- Kapros [phone]) on Wednesday, August 16, 2010 at 2:30 pm for multi-nodular goiter.    Left message with Dr. Faythe Ghee office [phone]) to call us to set up an appointment for depression.

## 2010-06-21 LAB — T4, FREE: T4, Free: 1.39 ng/dL (ref 0.82–1.77)

## 2010-06-21 LAB — TSH 3RD GENERATION: TSH: 0.888 u[IU]/mL (ref 0.450–4.500)

## 2010-06-21 LAB — T3 TOTAL: T3, total: 119 ng/dL (ref 71–180)

## 2010-06-21 NOTE — Progress Notes (Signed)
Left 2nd message for Dr. Johny Shock office to call to set up an appointment for the patient.

## 2010-06-21 NOTE — Progress Notes (Signed)
Appointment with Dr. Sharla Kidney on Thursday, July 20, 2010 at 3:45 pm for depression. Will call the patient's daughter and mail her the appointment information. (for both appointments)

## 2010-09-22 NOTE — Telephone Encounter (Signed)
Pt's friend Romero Belling) states pt is emotional , no appetite, not getting out of bed and would like Dr. [place] to prescribe a med to increase appetite and possibly see pt- informed Ms. Walker Dr. [place]and will not prescribe any new med without seeing pt first-and Dr. [place]and would probably want pt to see psychiatrist and will check with Dr. [place] and call back.    Called Mrs. Dan Humphreys [phone] to inform that Dr. [place]and will not prescribe a new medication for appetite and pt will need to see psychiatrist if pt is emotional, not eating, not getting out of bed. Mrs. Walker verbalized understanding.

## 2010-09-26 NOTE — Telephone Encounter (Signed)
Pt states she needs refills on Toprol XL-informed pt should have refills. Will call pharm and check.    Called pt back and informed that she had refills on Toprol XL and pharmacist would be filling RX. Pt verbalized understanding

## 2010-09-26 NOTE — Telephone Encounter (Signed)
Message copied by Purvis Sheffield on Tue Sep 26, 2010  2:26 PM  ------       Message from: Margarette Asal       Created: Tue Sep 26, 2010 12:56 PM       Regarding: Jordan-Sayles / refill request         Dr.Jordan-Sayles   09/26/2010   1:02pm              Patient: Gina Holloway ([birth year])              Patient is requesting a refill on her metoprolol-XL (TOPROL-XL) 25 mg XL tablet.all              [facility]   [phone]              Best # to contact patient: [phone] (H)

## 2010-10-16 NOTE — Progress Notes (Signed)
HISTORY OF PRESENT ILLNESS  Gina Holloway is a 76 y.o. female.  HPI  Patient comes in today for hospital follow up.  Patient was seen at [facility] in July 2012 for 1 week for nervousness, crying.  Patient was placed on Paxil by Dr. Sharla Kidney in July.  During her visit with him in August, he increased her dose from 20mg  to 30mg  daily.   Patient states that they have made her feel lazy and not getting out of bed, and patient also states that she was either dreaming or hallucinating with it, so she stopped taking Paxil altogether about 1 week ago.  Patient states that she feels about the same.  She states she has no appetite and she has lost weight.  Patient states that mornings are worse, she doesn't want to do anything but lay in bed, and in the evenings, she feels like she wants to get up and do something. Patient states she will call daughter and neighbor when she feels down.  Denies suicidal or homicidal ideations.  Patient states that her husband died in May 22, 2011and the doctors at [facility] told her they think that situation is related to her current condition.  Patient states that she thinks it stems from the death of her mother when the patient was 59.  Patient states that her mother left her 18th birthday party and had a MVA and died.  Patient states that she does not like to drive, especially on highways because of that.  She states she also had a friend of 60+ years that has recently died.  The thinks the combination of all of these events have contributed to her current condition.   Patient not sure when her next appointment is with Dr. Leilani Merl  Patient reports she is taking the rest of her medications (for GERD, BP, and cholesterol).     Review of Systems   Constitutional: Positive for weight loss and malaise/fatigue.   Respiratory: Positive for shortness of breath.    Cardiovascular: Positive for chest pain (during "nervous" episodes). Negative for palpitations.    Gastrointestinal: Positive for constipation. Negative for abdominal pain and blood in stool.   Genitourinary: Negative for dysuria, urgency, frequency and hematuria.        Patient states that she does not void large amounts, but she is not drinking much fluid some days.   Neurological: Positive for weakness. Negative for dizziness and loss of consciousness.   Psychiatric/Behavioral: Positive for depression (patient states she feels sad and wants to cry) and hallucinations (patient unsure if she was dreaming or hallucinating). Negative for suicidal ideas. The patient is nervous/anxious (feels nervous and jittery) and has insomnia (patient states has not slept at all last 2 nights).        Physical Exam    ASSESSMENT and PLAN  1. Depression  REFERRAL TO PSYCHIATRY   2. Anxiety  REFERRAL TO PSYCHIATRY     Encounter Diagnoses   Name Primary?   ??? Depression    ??? Anxiety      Gina Holloway was seen today for hospital follow up.    Diagnoses and associated orders for this visit:    Depression  - REFERRAL TO PSYCHIATRY    Anxiety  - REFERRAL TO PSYCHIATRY    Other Orders  - metoprolol-XL (TOPROL-XL) 50 mg XL tablet; Take 50 mg by mouth daily.    - Cancel: METABOLIC PANEL, BASIC  - Cancel: HEMOGLOBIN A1C        Follow-up Disposition:  Return in about 2 weeks  Phone call to Dr. Candi Leash office.  Patient can be seen today.  Patient will be taken to Dr. Johny Shock office by friend.  Left a message on daughter's voice mail indicating patient would be seen by psychiatry today.    Pt seen and discussed with NP student.  Pt agrees to seeing psychiatry this afternoon.

## 2010-10-16 NOTE — Patient Instructions (Signed)
MyChart Activation    Thank you for requesting access to MyChart. Please follow the instructions below to securely access and download your online medical record. MyChart allows you to send messages to your doctor, view your test results, renew your prescriptions, schedule appointments, and more.    How Do I Sign Up?    1. In your internet browser, go to [email or web address]  2. Click on the First Time User? Click Here link in the Sign In box. You will be redirect to the New Member Sign Up page.  3. Enter your MyChart Access Code exactly as it appears below. You will not need to use this code after you???ve completed the sign-up process. If you do not sign up before the expiration date, you must request a new code.    MyChart Access Code: [email or web address]  Expires: 01/14/2011  1:06 PM (This is the date your MyChart access code will expire)    4. Enter the last four digits of your Social Security Number (xxxx) and Date of Birth (mm/dd/yyyy) as indicated and click Submit. You will be taken to the next sign-up page.  5. Create a MyChart ID. This will be your MyChart login ID and cannot be changed, so think of one that is secure and easy to remember.  6. Create a MyChart password. You can change your password at any time.  7. Enter your Password Reset Question and Answer. This can be used at a later time if you forget your password.   8. Enter your e-mail address. You will receive e-mail notification when new information is available in MyChart.  9. Click Sign Up. You can now view and download portions of your medical record.  10. Click the Download Summary menu link to download a portable copy of your medical information.    Additional Information    If you have questions, please visit the Frequently Asked Questions section of the MyChart website at [email or web address] Remember, MyChart is NOT to be used for urgent needs. For medical emergencies, dial 911.

## 2010-11-06 LAB — AMB POC URINALYSIS DIP STICK AUTO W/ MICRO
Bilirubin (UA POC): NEGATIVE
Blood (UA POC): NEGATIVE
Glucose (UA POC): NEGATIVE
Ketones (UA POC): NEGATIVE
Nitrites (UA POC): NEGATIVE
Protein (UA POC): NEGATIVE mg/dL
Specific gravity (UA POC): 1.015 (ref 1.001–1.035)
pH (UA POC): 7 (ref 4.6–8.0)

## 2010-11-06 MED ORDER — VALSARTAN 160 MG TAB
160 mg | ORAL_TABLET | Freq: Every day | ORAL | Status: DC
Start: 2010-11-06 — End: 2010-11-29

## 2010-11-06 NOTE — Progress Notes (Signed)
Addended by: Reather Laurence on: 11/06/2010 05:58 PM     Modules accepted: Orders

## 2010-11-06 NOTE — Progress Notes (Signed)
HISTORY OF PRESENT ILLNESS  Gina Holloway is a 76 y.o. female.  HPI  Follow up on BP and depression.  Feeling depressed.  Seen by psychiatrist and has been started on cymbalta.  Has only been taking for about 1 week.  Diminished appetite.  Sleeping better.  Has no energy.    Patient Active Problem List   Diagnoses Code   ??? HTN (Hypertension) 401.9   ??? GERD (Gastroesophageal Reflux Disease) 530.81   ??? Thyroid Goiter 240.9   ??? Osteoporosis 733.00   ??? Encounter for long-term (current) use of other medications V58.69   ??? Palpitations 785.1   ??? Mixed hyperlipidemia 272.2   ??? Anxiety 300.00   ??? Essential hypertension, benign 401.1   ??? Shortness of breath on exertion 786.05     Current Outpatient Prescriptions   Medication Sig Dispense Refill   ??? cyanocobalamin (VITAMIN B-12) 1,000 mcg tablet Take 1,000 mcg by mouth daily.         ??? DULoxetine (CYMBALTA) 30 mg capsule Take 30 mg by mouth daily.         ??? metoprolol-XL (TOPROL-XL) 25 mg XL tablet Take 1 Tab by mouth daily.  90 Tab  3   ??? valsartan (DIOVAN) 160 mg tablet Take 1 Tab by mouth daily.  30 Tab  0   ??? methylsulfonylmethane (MSM) 1,000 mg Cap Take 1 Cap by mouth as needed.         ??? Dexlansoprazole (DEXILANT) 60 mg CpDM Take 1 Tab by mouth daily.  30 Tab  6   ??? atorvastatin (LIPITOR) 40 mg tablet TAKE 1 TABLET BY MOUTH EVERY NIGHT  90 Tab  3   ??? aspirin 81 mg Tab Take 81 mg by mouth daily.         Allergies   Allergen Reactions   ??? Biaxin (Clarithromycin) Unknown (comments)     Pt not sure what type of reaction   ??? Flagyl (Metronidazole) Unknown (comments)     Pt not sure of what type of reaction     Past Medical History   Diagnosis Date   ??? HTN (hypertension) 03/29/2008   ??? Hypercholesterolemia 03/29/2008   ??? GERD (gastroesophageal reflux disease) 03/29/2008   ??? Thyroid goiter 03/29/2008   ??? Osteoporosis 03/29/2008   ??? Arrhythmia    ??? Murmur    ??? Other chest pain    ??? Reflux esophagitis    ??? Anxiety    ??? Essential hypertension      Past Surgical History    Procedure Date   ??? Hx cholecystectomy    ??? Hx tubal ligation    ??? Pr colonoscopy,diagnostic 02/18/2006     Dr Park Breed     Family History   Problem Relation Age of Onset   ??? Heart Disease Sister    ??? Arthritis-osteo Sister      History   Substance Use Topics   ??? Smoking status: Never Smoker    ??? Smokeless tobacco: Never Used   ??? Alcohol Use: No      Review of Systems   Constitutional: Positive for malaise/fatigue.   HENT: Negative for congestion.    Eyes: Negative for blurred vision.   Respiratory: Negative for cough and shortness of breath.    Cardiovascular: Negative for chest pain, palpitations and leg swelling.   Gastrointestinal: Negative for heartburn, abdominal pain and constipation.   Genitourinary: Negative for dysuria, urgency and frequency.   Musculoskeletal: Negative for back pain and joint pain.  Neurological: Positive for weakness. Negative for dizziness, tingling and headaches.   Endo/Heme/Allergies: Negative for environmental allergies.   Psychiatric/Behavioral: Positive for depression. The patient is nervous/anxious and has insomnia.        Physical Exam   Vitals reviewed.  Constitutional: She appears well-developed and well-nourished.   HENT:   Right Ear: Tympanic membrane and ear canal normal.   Left Ear: Tympanic membrane and ear canal normal.   Nose: No mucosal edema or rhinorrhea.   Mouth/Throat: Oropharynx is clear and moist and mucous membranes are normal.   Neck: Normal range of motion. Neck supple. No thyromegaly present.        Fullness in the right neck.  Hx thyroid goiter.   Cardiovascular: Normal rate and regular rhythm.    No murmur heard.  Pulmonary/Chest: Effort normal and breath sounds normal.   Abdominal: Soft. Bowel sounds are normal. There is no tenderness.   Musculoskeletal: Normal range of motion. She exhibits no edema.   Lymphadenopathy:     She has no cervical adenopathy.   Skin: Skin is warm and dry.   Psychiatric: She has a normal mood and affect.       ASSESSMENT and PLAN   Gina Holloway was seen today for fatigue.    Diagnoses and associated orders for this visit:    Htn (hypertension)  - Discontinue: metoprolol-XL (TOPROL-XL) 25 mg XL tablet; Take 1 Tab by mouth two (2) times a day.  - metoprolol-XL (TOPROL-XL) 25 mg XL tablet; Take 1 Tab by mouth daily.  - Reduce to (DIOVAN) 160 mg tablet; Take 1 Tab by mouth daily.    Depression//Anxiety  - DULoxetine (CYMBALTA) 30 mg capsule; Take 30 mg by mouth daily.      Mixed hyperlipidemia    Igt (impaired glucose tolerance)  - HEMOGLOBIN A1C    Thyroid goiter  - US THYROID/PARATHYROID/SOFT TISS; Future  - [place] HEAD NECK SOFT TISSUE; Future  - TSH, 3RD GENERATION  - T4, FREE    Neck fullness  - [place] HEAD NECK SOFT TISSUE; Future    Weakness  - CBC W/O DIFF    Hypovitaminosis d  - VITAMIN D, 25 HYDROXY    Encounter for long-term (current) use of other medications  - METABOLIC PANEL, BASIC    Other Orders  - cyanocobalamin (VITAMIN B-12) 1,000 mcg tablet; Take 1,000 mcg by mouth daily.          Follow-up Disposition:  Return in about 3 weeks (around 11/27/2010).  reviewed diet, exercise and weight control  cardiovascular risk and specific lipid/LDL goals reviewed  reviewed medications and side effects in detail

## 2010-11-06 NOTE — Progress Notes (Signed)
Addended by: Marcy Siren D on: 11/06/2010 02:09 PM     Modules accepted: Orders

## 2010-11-07 LAB — METABOLIC PANEL, BASIC
BUN/Creatinine ratio: 19 (ref 11–26)
BUN: 20 mg/dL (ref 8–27)
CO2: 29 mmol/L (ref 20–32)
Calcium: 9.5 mg/dL (ref 8.6–10.2)
Chloride: 96 mmol/L — ABNORMAL LOW (ref 97–108)
Creatinine: 1.03 mg/dL — ABNORMAL HIGH (ref 0.57–1.00)
GFR est non-AA: 54 mL/min/{1.73_m2} — ABNORMAL LOW (ref >59)
Glucose: 124 mg/dL — ABNORMAL HIGH (ref 65–99)
Potassium: 4 mmol/L (ref 3.5–5.2)
Sodium: 138 mmol/L (ref 134–144)
eGFR If African American: 62 mL/min/{1.73_m2} (ref >59)

## 2010-11-07 LAB — CBC W/O DIFF
HCT: 41.3 % (ref 34.0–46.6)
HGB: 13.6 g/dL (ref 11.1–15.9)
MCH: 29.4 pg (ref 26.6–33.0)
MCHC: 32.9 g/dL (ref 31.5–35.7)
MCV: 89 fL (ref 79–97)
PLATELET: 271 10*3/uL (ref 140–415)
RBC: 4.63 x10E6/uL (ref 3.77–5.28)
RDW: 12.4 % (ref 12.3–15.4)
WBC: 5.4 10*3/uL (ref 4.0–10.5)

## 2010-11-07 LAB — HEMOGLOBIN A1C WITH EAG: Hemoglobin A1c: 6.1 % — ABNORMAL HIGH (ref 4.8–5.6)

## 2010-11-07 LAB — VITAMIN D, 25 HYDROXY: VITAMIN D, 25-HYDROXY: 35.1 ng/mL (ref 30.0–100.0)

## 2010-11-07 LAB — T4, FREE: T4, Free: 1.26 ng/dL (ref 0.82–1.77)

## 2010-11-07 LAB — TSH 3RD GENERATION: TSH: 0.99 u[IU]/mL (ref 0.450–4.500)

## 2010-11-08 LAB — CULTURE, URINE

## 2010-11-12 MED ORDER — TRIMETHOPRIM-SULFAMETHOXAZOLE 160 MG-800 MG TAB
160-800 mg | ORAL_TABLET | Freq: Two times a day (BID) | ORAL | Status: AC
Start: 2010-11-12 — End: 2010-11-22

## 2010-11-12 NOTE — Progress Notes (Signed)
Quick Note:    Please call pt and let her know that urine show infection. rx sent to the pharm  ______

## 2010-11-13 NOTE — Progress Notes (Signed)
Called pt - urine showed infection per Dr. [place] and RX has been sent to pharm- need to get RX and start taking medication and drink plenty of water. Pt verbalized understanding

## 2010-11-28 LAB — AMB POC GLUCOSE BLOOD, BY GLUCOSE MONITORING DEVICE

## 2010-11-28 NOTE — Progress Notes (Signed)
HISTORY OF PRESENT ILLNESS  Gina Holloway is a 76 y.o. female.  HPI  Follow up on depression and BP.  On cymbalta per psych.  Pt is accompanied by friend who is trying to help her "spiritually" work thru her depression. (she is a Optician, dispensing).  Overall feeling some better.  Still very much is feeling depressed though not suicidal.  Has periods of still feeling anxious. Has appt to follow up with psych on next month.  Appetite is still poor and has been having abd pain.  Feels weak but thinks this is from not eating.  Also has had cough for the past several weeks.  Cough is usually dry.  No fever or chills and no chest pain.    Patient Active Problem List   Diagnoses Code   ??? HTN (Hypertension) 401.9   ??? GERD (Gastroesophageal Reflux Disease) 530.81   ??? Thyroid Goiter 240.9   ??? Osteoporosis 733.00   ??? Encounter for long-term (current) use of other medications V58.69   ??? Palpitations 785.1   ??? Mixed hyperlipidemia 272.2   ??? Anxiety 300.00   ??? Essential hypertension, benign 401.1   ??? Shortness of breath on exertion 786.05     Current Outpatient Prescriptions   Medication Sig Dispense Refill   ??? cyanocobalamin (VITAMIN B-12) 1,000 mcg tablet Take 1,000 mcg by mouth daily.         ??? DULoxetine (CYMBALTA) 30 mg capsule Take 30 mg by mouth daily.         ??? metoprolol-XL (TOPROL-XL) 25 mg XL tablet Take 1 Tab by mouth daily.  90 Tab  3   ??? valsartan (DIOVAN) 160 mg tablet Take 1 Tab by mouth daily.  30 Tab  0   ??? methylsulfonylmethane (MSM) 1,000 mg Cap Take 1 Cap by mouth as needed.         ??? Dexlansoprazole (DEXILANT) 60 mg CpDM Take 1 Tab by mouth daily.  30 Tab  6   ??? atorvastatin (LIPITOR) 40 mg tablet TAKE 1 TABLET BY MOUTH EVERY NIGHT  90 Tab  3   ??? aspirin 81 mg Tab Take 81 mg by mouth daily.         Allergies   Allergen Reactions   ??? Biaxin (Clarithromycin) Unknown (comments)     Pt not sure what type of reaction   ??? Flagyl (Metronidazole) Unknown (comments)     Pt not sure of what type of reaction      Past Medical History   Diagnosis Date   ??? HTN (hypertension) 03/29/2008   ??? Hypercholesterolemia 03/29/2008   ??? GERD (gastroesophageal reflux disease) 03/29/2008   ??? Thyroid goiter 03/29/2008   ??? Osteoporosis 03/29/2008   ??? Arrhythmia    ??? Murmur    ??? Other chest pain    ??? Reflux esophagitis    ??? Anxiety    ??? Essential hypertension      Past Surgical History   Procedure Date   ??? Hx cholecystectomy    ??? Hx tubal ligation    ??? Pr colonoscopy,diagnostic 02/18/2006     Dr Park Breed     Family History   Problem Relation Age of Onset   ??? Heart Disease Sister    ??? Arthritis-osteo Sister      History   Substance Use Topics   ??? Smoking status: Never Smoker    ??? Smokeless tobacco: Never Used   ??? Alcohol Use: No      Review of Systems  Constitutional: Negative for fever, chills and malaise/fatigue.   HENT: Negative for congestion.    Eyes: Negative for blurred vision.   Respiratory: Positive for cough. Negative for sputum production, shortness of breath and wheezing.    Cardiovascular: Negative for chest pain, palpitations and leg swelling.   Gastrointestinal: Positive for nausea and abdominal pain. Negative for heartburn and constipation.   Genitourinary: Negative for dysuria, urgency and frequency.   Musculoskeletal: Negative for back pain and joint pain.   Neurological: Negative for dizziness, tingling and headaches.   Endo/Heme/Allergies: Negative for environmental allergies.   Psychiatric/Behavioral: Positive for depression and memory loss. Negative for suicidal ideas, hallucinations and substance abuse. The patient is nervous/anxious and has insomnia.        Physical Exam   Vitals reviewed.  Constitutional: She appears well-developed and well-nourished.   HENT:   Right Ear: Tympanic membrane and ear canal normal.   Left Ear: Tympanic membrane and ear canal normal.   Nose: No mucosal edema or rhinorrhea.   Mouth/Throat: Oropharynx is clear and moist and mucous membranes are normal.    Neck: Normal range of motion. Neck supple. No thyromegaly present.   Cardiovascular: Normal rate and regular rhythm.    No murmur heard.  Pulmonary/Chest: Effort normal and breath sounds normal.   Abdominal: Soft. Bowel sounds are normal. There is no tenderness.   Musculoskeletal: Normal range of motion. She exhibits no edema.   Lymphadenopathy:     She has no cervical adenopathy.   Skin: Skin is warm and dry.   Psychiatric: She has a normal mood and affect.       ASSESSMENT and PLAN  Gina Holloway was seen today for depression and follow-up.    Diagnoses and associated orders for this visit:    Weight loss  - REFERRAL TO GASTROENTEROLOGY    Abdominal pain  - REFERRAL TO GASTROENTEROLOGY    Depression    Htn (hypertension)    Mixed hyperlipidemia    Igt (impaired glucose tolerance)  - AMB POC GLUCOSE BLOOD, BY GLUCOSE MONITORING DEVICE    Gerd (gastroesophageal reflux disease)    Thyroid goiter    Cough  - XR CHEST PA LAT; Future    Encounter for long-term (current) use of other medications        Follow-up Disposition:  Return in about 1 month (around 12/29/2010).  reviewed diet, exercise and weight control  cardiovascular risk and specific lipid/LDL goals reviewed  reviewed medications and side effects in detail

## 2010-11-29 ENCOUNTER — Encounter

## 2010-11-29 MED ORDER — VALSARTAN 160 MG TAB
160 mg | ORAL_TABLET | Freq: Every day | ORAL | Status: DC
Start: 2010-11-29 — End: 2011-02-27

## 2010-11-30 ENCOUNTER — Telehealth

## 2010-11-30 ENCOUNTER — Encounter

## 2010-11-30 NOTE — Telephone Encounter (Signed)
enlarging goiter.  Refer to surgery

## 2010-12-01 MED ORDER — SODIUM CHLORIDE 0.9 % IJ SYRG
Freq: Once | INTRAMUSCULAR | Status: AC
Start: 2010-12-01 — End: 2010-12-01
  Administered 2010-12-01: 19:00:00 via INTRAVENOUS

## 2010-12-01 MED ORDER — SODIUM CHLORIDE 0.9 % IV
Freq: Once | INTRAVENOUS | Status: DC
Start: 2010-12-01 — End: 2010-12-01

## 2010-12-01 MED ORDER — IOVERSOL 350 MG/ML IV SOLN
350 mg iodine/mL | Freq: Once | INTRAVENOUS | Status: DC
Start: 2010-12-01 — End: 2010-12-01

## 2010-12-01 MED ORDER — IOVERSOL 350 MG/ML IV SOLN
350 mg iodine/mL | Freq: Once | INTRAVENOUS | Status: AC
Start: 2010-12-01 — End: 2010-12-01
  Administered 2010-12-01: 19:00:00 via INTRAVENOUS

## 2010-12-01 MED ORDER — BARIUM SULFATE 2 % ORAL SUSP
2.1 % (w/v), 2.0 % (w/w) | Freq: Once | ORAL | Status: AC
Start: 2010-12-01 — End: 2010-12-01
  Administered 2010-12-01: 19:00:00 via ORAL

## 2010-12-01 MED ORDER — SODIUM CHLORIDE 0.9 % IV
Freq: Once | INTRAVENOUS | Status: AC
Start: 2010-12-01 — End: 2010-12-01
  Administered 2010-12-01: 19:00:00 via INTRAVENOUS

## 2010-12-08 NOTE — Progress Notes (Signed)
Surgery Consult:  Multinodular goiter  Requesting physician:  Dr. Joesph July    Subjective:   Patient 76 y.o. African American female presents with abnormal thyroid US.  Patient underwent thyroid US on 11/14/10 which showed enlarging multinodular goiter.  Largest nodule remains in the lower pole right lobe, currently measuring 2.7 x 2.4 x 2.0 cm, previously 2.3 x 1.8 x 2.3 cm. Right isthmic nodule measures 2.5 x 2.4 x 1.3, previously not discretely measurable. Right thyroid lobe measures approximately 7.0 x 3.2 x 2.7 cm, previously 6.8 x 2.5 x 2.1 cm. Left lobe measures approximately 4.6 x 2.1 x 1.8 cm, previously 3.9 x 1.7 x 1.5 cm.  Patient reports mild dysphagia with pills only.  Tolerating solid foods and liquid without dysphagia.  No prior EGD.  Intermittent palpitation.  No heat or cold intolerance.  No history of radiation exposure.  No change in bowel habits.  No abdominal pain.   +fatigue.  No neck pain.  Patient's last TSH level was WNL (0.99 on 11/06/10).          Past Medical & Surgical History:  Past Medical History   Diagnosis Date   ??? HTN (hypertension) 03/29/2008   ??? Hypercholesterolemia 03/29/2008   ??? GERD (gastroesophageal reflux disease) 03/29/2008   ??? Thyroid goiter 03/29/2008   ??? Osteoporosis 03/29/2008   ??? Arrhythmia    ??? Murmur    ??? Other chest pain    ??? Reflux esophagitis    ??? Anxiety    ??? Essential hypertension       Past Surgical History   Procedure Date   ??? Hx cholecystectomy    ??? Hx tubal ligation    ??? Pr colonoscopy,diagnostic 02/18/2006     Dr Park Breed       Social History:  History     Social History   ??? Marital Status: Divorced     Spouse Name: N/A     Number of Children: N/A   ??? Years of Education: N/A     Occupational History   ??? Not on file.     Social History Main Topics   ??? Smoking status: Never Smoker    ??? Smokeless tobacco: Never Used   ??? Alcohol Use: No   ??? Drug Use: No   ??? Sexually Active: No     Other Topics Concern   ??? Not on file     Social History Narrative     ** Merged History Encounter **         Family History:  Family History   Problem Relation Age of Onset   ??? Heart Disease Sister    ??? Arthritis-osteo Sister         Medications:  Current Outpatient Prescriptions   Medication Sig   ??? valsartan (DIOVAN) 160 mg tablet Take 1 Tab by mouth daily.   ??? cyanocobalamin (VITAMIN B-12) 1,000 mcg tablet Take 1,000 mcg by mouth daily.     ??? DULoxetine (CYMBALTA) 30 mg capsule Take 30 mg by mouth daily.     ??? metoprolol-XL (TOPROL-XL) 25 mg XL tablet Take 1 Tab by mouth daily.   ??? methylsulfonylmethane (MSM) 1,000 mg Cap Take 1 Cap by mouth as needed.     ??? Dexlansoprazole (DEXILANT) 60 mg CpDM Take 1 Tab by mouth daily.   ??? atorvastatin (LIPITOR) 40 mg tablet TAKE 1 TABLET BY MOUTH EVERY NIGHT   ??? aspirin 81 mg Tab Take 81 mg by mouth daily.  Allergies:  Allergies   Allergen Reactions   ??? Biaxin (Clarithromycin) Unknown (comments)     Pt not sure what type of reaction   ??? Flagyl (Metronidazole) Unknown (comments)     Pt not sure of what type of reaction       Review of Systems  A comprehensive review of systems was negative except for that written in the HPI.    Objective:     Exam:    BP 130/73   Pulse 65   Resp 18   Ht 5\' 4"  (1.626 m)   Wt 142 lb 3.2 oz (64.501 kg)   BMI 24.41 kg/m2   SpO2 97%  General appearance: alert, cooperative, no distress, appears stated age  Eyes: negative  Neck: supple, symmetrical, trachea midline, no adenopathy and thyroid: enlarged right lobe  Lungs: clear to auscultation bilaterally  Heart: regular rate and rhythm  Abdomen: soft, non-tender.  Non-distended.  Extremities: extremities normal, atraumatic, no cyanosis or edema  Skin: Skin color, texture, turgor normal.   Lymph nodes: Cervical adenopathy: none bilaterally  Neurologic: Grossly normal      Data Review  Thyroid US (11/14/10):  The gland is enlarged by nodules primarily the right lobe and isthmus, and the nodules and the gland show interval increase in size.     Largest nodule remains in the lower pole right lobe, currently measuring 2.7 x 2.4 x 2.0 cm, previously 2.3 x 1.8 x 2.3 cm. Right isthmic nodule measures   2.5 x 2.4 x 1.3, previously not discretely measurable. Subcentimeter nodules are scattered in the remainder of both lobes.    Right thyroid lobe measures approximately 7.0 x 3.2 x 2.7 cm, previously 6.8 x 2.5 x 2.1 cm. Left lobe measures approximately 4.6 x 2.1 x 1.8 cm,   previously 3.9 x 1.7 x 1.5 cm.    IMPRESSION: Enlarging multinodular goiter.        Assessment:     Multinodular goiter  Euthyroid    Plan:     FNA of the dominant thyroid nodules.  RTC after FNA.

## 2010-12-15 NOTE — Progress Notes (Signed)
Patient is here for follow-up after thyroid FNA (12/12/10)     CYTOLOGIC INTERPRETATION:  Abundant colloid, follicular cells and macrophages  See comment  Specimen Adequacy  Satisfactory for evaluation.  Comment  These findings are most consistent with a colloid/hyperplastic nodule.     Path reviewed with the patient.  All questions answered.    Pathology is benign but patient has mild dysphagia symptom.  Thyroidectomy vs conservative treatment discussed with the patient.  Risks, benefit, and alternative to surgery was discussed with the patient.  Patient wants to think about it and also wants referral to endocrine.  Refer to Dr. Laural Benes.  Instructed to RTC in 3 months; earlier if symptoms worsens.      Time spent with the patient discussing above:  15 min.

## 2010-12-18 LAB — TSH 3RD GENERATION: TSH: 0.52 u[IU]/mL (ref 0.36–3.74)

## 2010-12-18 LAB — METABOLIC PANEL, COMPREHENSIVE
A-G Ratio: 0.9 — ABNORMAL LOW (ref 1.1–2.2)
ALT (SGPT): 24 U/L (ref 12–78)
AST (SGOT): 21 U/L (ref 15–37)
Albumin: 3.3 g/dL — ABNORMAL LOW (ref 3.5–5.0)
Alk. phosphatase: 61 U/L (ref 50–136)
Anion gap: 6 mmol/L (ref 5–15)
BUN/Creatinine ratio: 24 — ABNORMAL HIGH (ref 12–20)
BUN: 20 MG/DL (ref 6–20)
Bilirubin, total: 0.3 MG/DL (ref 0.2–1.0)
CO2: 29 MMOL/L (ref 21–32)
Calcium: 9.2 MG/DL (ref 8.5–10.1)
Chloride: 105 MMOL/L (ref 97–108)
Creatinine: 0.84 MG/DL (ref 0.45–1.15)
GFR est AA: >60 mL/min/{1.73_m2} (ref >60)
GFR est non-AA: >60 mL/min/{1.73_m2} (ref >60)
Globulin: 3.6 g/dL (ref 2.0–4.0)
Glucose: 161 MG/DL — ABNORMAL HIGH (ref 65–100)
Potassium: 3.8 MMOL/L (ref 3.5–5.1)
Protein, total: 6.9 g/dL (ref 6.4–8.2)
Sodium: 140 MMOL/L (ref 136–145)

## 2010-12-18 LAB — CBC WITH AUTOMATED DIFF
ABS. BASOPHILS: 0 10*3/uL (ref 0.0–0.1)
ABS. EOSINOPHILS: 0.1 10*3/uL (ref 0.0–0.4)
ABS. LYMPHOCYTES: 1.3 10*3/uL (ref 0.8–3.5)
ABS. MONOCYTES: 0.5 10*3/uL (ref 0.0–1.0)
ABS. NEUTROPHILS: 2.5 10*3/uL (ref 1.8–8.0)
BASOPHILS: 1 % (ref 0–1)
EOSINOPHILS: 2 % (ref 0–7)
HCT: 41 % (ref 35.0–47.0)
HGB: 13.4 g/dL (ref 11.5–16.0)
LYMPHOCYTES: 29 % (ref 12–49)
MCH: 29.6 PG (ref 26.0–34.0)
MCHC: 32.7 g/dL (ref 30.0–36.5)
MCV: 90.5 FL (ref 80.0–99.0)
MONOCYTES: 11 % (ref 5–13)
NEUTROPHILS: 57 % (ref 32–75)
PLATELET: 205 10*3/uL (ref 150–400)
RBC: 4.53 M/uL (ref 3.80–5.20)
RDW: 12.3 % (ref 11.5–14.5)
WBC: 4.3 10*3/uL (ref 3.6–11.0)

## 2010-12-18 MED ORDER — SODIUM CHLORIDE 0.9% BOLUS IV
0.9 % | Freq: Once | INTRAVENOUS | Status: AC
Start: 2010-12-18 — End: 2010-12-18
  Administered 2010-12-18: 17:00:00 via INTRAVENOUS

## 2010-12-18 MED ORDER — LORAZEPAM 2 MG/ML IJ SOLN
2 mg/mL | INTRAMUSCULAR | Status: AC
Start: 2010-12-18 — End: 2010-12-18
  Administered 2010-12-18: 17:00:00 via INTRAVENOUS

## 2010-12-18 MED ORDER — LORAZEPAM 1 MG TAB
1 mg | ORAL_TABLET | Freq: Three times a day (TID) | ORAL | Status: DC | PRN
Start: 2010-12-18 — End: 2011-01-03

## 2010-12-18 NOTE — ED Notes (Signed)
Patient reports tremors/shaking all over. Patient reports that she has been undergoing thyroid testing at [facility] office and has been anxious about results. Patient appears very anxious on exam. Patient jumping at each little noise. Patient denies any other complaints other than tremors.

## 2010-12-18 NOTE — ED Provider Notes (Signed)
HPI Comments: Gina Holloway 76 y.o. presents ambulatory to ED with CC of acutely worse tremors x last night. Pt indicates she has had mild tremors at baseline but sxs have become progressively worse. Pt indicates she has a goiter on her thyroid but denies taking medication for it. Pt reports feeling anxious and cold.  Pt denies fever, dysuria, abd pain, vomiting, or diarrhea.    PCP: Paschal Dopp, MD   GIPark Breed    PMHx: Significant for HTN, hypercholesterolemia, GERD, thyroid goiter, osteoporosis, arrhythmia, murmur, reflux esophagitis, anxiety  PSHx: Significant for cholecystectomy, tubal ligation, colonoscopy    There are no other complaints, changes or physical findings at this time. 12:02 PM   Written by Sidney Ace, ED Scribe, as dictated by Nicki Reaper, *.      The history is provided by the patient.        Past Medical History   Diagnosis Date   ??? HTN (hypertension) 03/29/2008   ??? Hypercholesterolemia 03/29/2008   ??? GERD (gastroesophageal reflux disease) 03/29/2008   ??? Thyroid goiter 03/29/2008   ??? Osteoporosis 03/29/2008   ??? Arrhythmia    ??? Murmur    ??? Other chest pain    ??? Reflux esophagitis    ??? Anxiety    ??? Essential hypertension         Past Surgical History   Procedure Date   ??? Hx cholecystectomy    ??? Hx tubal ligation    ??? Pr colonoscopy,diagnostic 02/18/2006     Dr Park Breed         Family History   Problem Relation Age of Onset   ??? Heart Disease Sister    ??? Arthritis-osteo Sister         History     Social History   ??? Marital Status: Divorced     Spouse Name: N/A     Number of Children: N/A   ??? Years of Education: N/A     Occupational History   ??? Not on file.     Social History Main Topics   ??? Smoking status: Never Smoker    ??? Smokeless tobacco: Never Used   ??? Alcohol Use: No   ??? Drug Use: No   ??? Sexually Active: No     Other Topics Concern   ??? Not on file     Social History Narrative    ** Merged History Encounter **                   ALLERGIES: Biaxin and Flagyl       Review of Systems   Constitutional: Positive for chills. Negative for fever.   HENT: Negative.    Eyes: Negative.    Respiratory: Negative.    Cardiovascular: Negative.    Gastrointestinal: Negative.  Negative for vomiting, abdominal pain and diarrhea.   Genitourinary: Negative.  Negative for dysuria.   Musculoskeletal: Negative.    Skin: Negative.    Neurological: Positive for tremors.   Psychiatric/Behavioral: The patient is nervous/anxious.    All other systems reviewed and are negative.        Filed Vitals:    12/18/10 1153 12/18/10 1304 12/18/10 1404 12/18/10 1504   BP: 131/50 115/59 112/48 124/47   Pulse: 81 82 69 74   Temp: 98 ??F (36.7 ??C)      Resp: 24 20 18 18    Height: 5\' 5"  (1.651 m)      Weight: 65.7 kg (144 lb 13.5  oz)      SpO2: 95% 97% 97% 99%            Physical Exam   Nursing note and vitals reviewed.  Constitutional: She is oriented to person, place, and time. She appears well-developed and well-nourished. No distress.   HENT:   Head: Normocephalic and atraumatic.   Right Ear: Tympanic membrane and external ear normal.   Left Ear: Tympanic membrane and external ear normal.   Nose: Nose normal.   Mouth/Throat: Oropharynx is clear and moist and mucous membranes are normal. No oropharyngeal exudate.   Eyes: Conjunctivae and EOM are normal. Pupils are equal, round, and reactive to light. Right eye exhibits no discharge. Left eye exhibits no discharge. No scleral icterus.   Neck: Normal range of motion. Neck supple. No JVD present. No tracheal deviation present. No thyromegaly present.   Cardiovascular: Normal rate, regular rhythm, normal heart sounds and intact distal pulses.  Exam reveals no gallop and no friction rub.    No murmur heard.  Pulmonary/Chest: Effort normal and breath sounds normal. No stridor. No respiratory distress. She has no wheezes. She has no rales. She exhibits no tenderness.    Abdominal: Soft. Bowel sounds are normal. She exhibits no distension and no mass. There is no tenderness. There is no rebound and no guarding.   Musculoskeletal: Normal range of motion. She exhibits no edema and no tenderness.   Lymphadenopathy:     She has no cervical adenopathy.   Neurological: She is alert and oriented to person, place, and time. She has normal strength and normal reflexes. No cranial nerve deficit or sensory deficit. She exhibits normal muscle tone. Coordination and gait normal.   Skin: Skin is warm and dry. No rash noted. She is not diaphoretic. No erythema. No pallor.   Psychiatric:        Anxious.    Written by Sidney Ace, ED Scribe, as dictated by Nicki Reaper, *.       MDM     Differential Diagnosis; Clinical Impression; Plan:     Tremors ceased with ativan feels much better normal tsh, other labs ok, patient with likely anxiety disorder, will dc on ativan to follow with pcp.          Amount and/or Complexity of Data Reviewed:   Clinical lab tests:  Ordered and reviewed   Review and summarize past medical records:  Yes      Procedures    3:25 PM  The patient's results have been reviewed with them.  Patient and/or family, verbally conveyed their understanding and agreement of the patient's signs, symptoms, diagnosis, treatment and prognosis and additionally agree to follow up as recommended with PCP or return to the Emergency Room should their condition change prior to their follow-up appointment. The patient verbally agrees with the care-plan and verbally conveys that all of their questions have been answered. The patient will be discharged with prescriptions for ativan.  Written by Sidney Ace, ED Scribe, as dictated by Nicki Reaper, *.     LABORATORY TESTS:  Recent Results (from the past 12 hour(s))   CBC WITH AUTOMATED DIFF    Collection Time    12/18/10 12:20 PM       Component Value Range    WBC 4.3  3.6 - 11.0 K/uL    RBC 4.53  3.80 - 5.20 M/uL     HGB 13.4  11.5 - 16.0 g/dL    HCT 86.5  78.4 -  47.0 %    MCV 90.5  80.0 - 99.0 FL    MCH 29.6  26.0 - 34.0 PG    MCHC 32.7  30.0 - 36.5 g/dL    RDW 09.8  11.9 - 14.7 %    PLATELET 205  150 - 400 K/uL    NEUTROPHILS 57  32 - 75 %    LYMPHOCYTES 29  12 - 49 %    MONOCYTES 11  5 - 13 %    EOSINOPHILS 2  0 - 7 %    BASOPHILS 1  0 - 1 %    ABS. NEUTROPHILS 2.5  1.8 - 8.0 K/UL    ABS. LYMPHOCYTES 1.3  0.8 - 3.5 K/UL    ABS. MONOCYTES 0.5  0.0 - 1.0 K/UL    ABS. EOSINOPHILS 0.1  0.0 - 0.4 K/UL    ABS. BASOPHILS 0.0  0.0 - 0.1 K/UL   METABOLIC PANEL, COMPREHENSIVE    Collection Time    12/18/10 12:20 PM       Component Value Range    Sodium 140  136 - 145 MMOL/L    Potassium 3.8  3.5 - 5.1 MMOL/L    Chloride 105  97 - 108 MMOL/L    CO2 29  21 - 32 MMOL/L    Anion gap 6  5 - 15 mmol/L    Glucose 161 (*) 65 - 100 MG/DL    BUN 20  6 - 20 MG/DL    Creatinine 8.29  5.62 - 1.15 MG/DL    BUN/Creatinine ratio 24 (*) 12 - 20      GFR est AA >60  >60 ml/min/1.59m2    GFR est non-AA >60  >60 ml/min/1.43m2    Calcium 9.2  8.5 - 10.1 MG/DL    Bilirubin, total 0.3  0.2 - 1.0 MG/DL    ALT 24  12 - 78 U/L    AST 21  15 - 37 U/L    Alk. phosphatase 61  50 - 136 U/L    Protein, total 6.9  6.4 - 8.2 g/dL    Albumin 3.3 (*) 3.5 - 5.0 g/dL    Globulin 3.6  2.0 - 4.0 g/dL    A-G Ratio 0.9 (*) 1.1 - 2.2     TSH, 3RD GENERATION    Collection Time    12/18/10 12:20 PM       Component Value Range    TSH 0.52  0.36 - 3.74 uIU/mL       IMAGING RESULTS:    MEDICATIONS GIVEN:    Medications   LORazepam (ATIVAN) 1 mg tablet (not administered)   sodium chloride 0.9 % bolus infusion 500 mL (0 mL IntraVENous IV Completed 12/18/10 1300)   LORazepam (ATIVAN) injection 1 mg (1 mg IntraVENous Given 12/18/10 1219)       IMPRESSION:  1. Anxiety state, unspecified        PLAN:  1. Ativan prn    Nicki Reaper DO

## 2010-12-18 NOTE — Telephone Encounter (Signed)
Called LMGlennon Hamilton [phone] returning call- please call back

## 2010-12-18 NOTE — Telephone Encounter (Signed)
Message copied by Aylana Hirschfeld W on Mon Dec 18, 2010  4:03 PM  ------       Message from: GRAY, MARLO P       Created: Mon Dec 18, 2010 11:07 AM       Regarding: jordan-sayles         Doris batts states she needs to talk to dr jordan-sayles about patients medication. Please return call thanks              Doris batts [phone]

## 2010-12-18 NOTE — Telephone Encounter (Signed)
Message copied by Purvis Sheffield on Mon Dec 18, 2010  4:03 PM  ------       Message from: Natasha Bence P       Created: Mon Dec 18, 2010 11:07 AM       Regarding: [name] batts states she needs to talk to dr Joesph July about patients medication. Please return call thanks              [name] [phone]

## 2010-12-19 NOTE — Progress Notes (Signed)
Patient discharged on 12/18/10 from [facility]. I spoke with the patient today. Identified patient by DOB. Patient is by herself and requested that I talk with Glennon Hamilton.She is listed on the HIPAA form.Patient reports that she is feeling "wozzy".She does not have an assistive device to use.She states that she caught herself before falling. Talked with her about being safe and avoiding falls. Unable to review home medication with patient or Ms. Batts. Reviewed red flags for anxiety state and patient understands when to seek medical attention from PCP. Patient's next follow up visit is scheduled for 01/01/11.Patient verbalizes understanding of discharge plan and has Nurse Navigator's contact information to call as needed.  Talked with Glennon Hamilton about the concerns for safety. She states that she was unsteady before starting on the medication. She will be seeing her this evening to evaluate the situation.She will try to get an earlier appointment.    Susa Simmonds RN

## 2010-12-20 NOTE — Telephone Encounter (Signed)
[name], Novella Olive, would prefer to speak to Dr. Swaziland regarding pt's health. She can be reached at [phone]

## 2010-12-20 NOTE — Telephone Encounter (Signed)
Left message for Gina Holloway, Dr. Carman Ching' nurse, to discuss concerns about patient.Provided contact information for Nurse Navigator.    Susa Simmonds RN

## 2010-12-20 NOTE — Telephone Encounter (Signed)
Message copied by Purvis Sheffield on Wed Dec 20, 2010 11:47 AM  ------       Message from: Margarette Asal       Created: Wed Dec 20, 2010 10:48 AM       Regarding: Joesph July / return call         Dr.Jordan-Sayles  12/20/2010   10:49am              Patient: Gina Holloway (02-28-1934)              Patient's daughter Novella Olive is requesting a return call back from Dr.Jordan-Sayles regarding her mother [name] health. Please call back to discuss further.all              Novella Olive - daughter [phone]

## 2010-12-22 NOTE — Telephone Encounter (Addendum)
[name] , Leslye Peer, [place] (p) [phone],VHQIONGEXB for Dr Swaziland to call her first or Mrs Rosanne Sack her other daughter they only prefer to speak with Dr.Jordan. Stated they have left several messages.

## 2010-12-22 NOTE — Telephone Encounter (Signed)
Received a call from Novella Gina Holloway, daughter listed on HIPAA. She voiced concerns about he mother. She reports that she is jittery, unsteady and is losing weight. When asked about safety at home, she reports that many times someone is there with her and that she is safe when left alone and is able to use the phone to call if she has problems. Ms. Dolphus Gina Holloway request information about agencies providing Home Care. Suggested she contact Social Services about Medicaid application. Will mail her a list of Home Care Agencies.  Susa Simmonds RN

## 2010-12-22 NOTE — Telephone Encounter (Signed)
Called.  Discussed with daughter.

## 2010-12-25 NOTE — Patient Instructions (Signed)
Thyroid:  Enlarged with noduled    Function is normal.  Nodules appear benign based on biopsy    Multinodular goiter (large thyroid with many nodules or lumps)    Concerns about a large multinodular goiter:  1. Is it making too much thyroid hormone?  No labs are normal  2. Is the size causing problems? - Can cause trouble breathing, swallowing, or hoarseness.    - We will evaluate with pulmonary function test.  3. Is it cancer? Biopsy was normal.  We will follow with another ultrasound in 12 months to monitor for changes.

## 2010-12-25 NOTE — Progress Notes (Addendum)
Chief Complaint   Patient presents with   ??? New Patient   ??? Thyroid Problem     History of Present Illness: Gina Holloway is a 76 y.o. female who presents for evaluation of thyroid nodules.  She also has marked depression and anxiety.  She has had dysphagia off and on for less than a year. + mild hoarseness at times.  No pain or discomfort.  She has had anxiety/depression and has tried several meds without significant success. Currently she is on no meds.    She is very depressed and anxious and was disappointed to hear that her thyroid function tests were normal  Her ex -husband died about a year ago, but she denies that she is being affected by this.    Past Medical History   Diagnosis Date   ??? HTN (hypertension) 03/29/2008   ??? Hypercholesterolemia 03/29/2008   ??? GERD (gastroesophageal reflux disease) 03/29/2008   ??? Thyroid goiter 03/29/2008   ??? Osteoporosis 03/29/2008   ??? Arrhythmia    ??? Murmur    ??? Other chest pain    ??? Reflux esophagitis    ??? Anxiety    ??? Essential hypertension      Past Surgical History   Procedure Date   ??? Hx cholecystectomy    ??? Hx tubal ligation    ??? Pr colonoscopy,diagnostic 02/18/2006     Dr Park Breed     Current Outpatient Prescriptions   Medication Sig   ??? LORazepam (ATIVAN) 1 mg tablet Take 1 Tab by mouth every eight (8) hours as needed for Anxiety.   ??? valsartan (DIOVAN) 160 mg tablet Take 1 Tab by mouth daily.   ??? cyanocobalamin (VITAMIN B-12) 1,000 mcg tablet Take 1,000 mcg by mouth daily.     ??? metoprolol-XL (TOPROL-XL) 25 mg XL tablet Take 1 Tab by mouth daily.   ??? Dexlansoprazole (DEXILANT) 60 mg CpDM Take 1 Tab by mouth daily.   ??? atorvastatin (LIPITOR) 40 mg tablet TAKE 1 TABLET BY MOUTH EVERY NIGHT   ??? aspirin 81 mg Tab Take 81 mg by mouth daily.   ??? DULoxetine (CYMBALTA) 30 mg capsule Take 30 mg by mouth daily.     ??? methylsulfonylmethane (MSM) 1,000 mg Cap Take 1 Cap by mouth as needed.       Allergies   Allergen Reactions   ??? Biaxin (Clarithromycin) Unknown (comments)      Pt not sure what type of reaction   ??? Flagyl (Metronidazole) Unknown (comments)     Pt not sure of what type of reaction     Family History   Problem Relation Age of Onset   ??? Heart Disease Sister    ??? Arthritis-osteo Sister      History     Social History   ??? Marital Status: Divorced     Spouse Name: N/A     Number of Children: N/A   ??? Years of Education: N/A     Occupational History   ??? Not on file.     Social History Main Topics   ??? Smoking status: Never Smoker    ??? Smokeless tobacco: Never Used   ??? Alcohol Use: No   ??? Drug Use: No   ??? Sexually Active: No     Other Topics Concern   ??? Not on file     Social History Narrative    ** Merged History Encounter **      Review of Systems:  - Constitutional Symptoms: no fevers,  chills, + unintentional wt loss  - Eyes: + occ blurry vision or double vision  - Cardiovascular: no chest pain or palpitations  - Respiratory: no cough or shortness of breath  - Gastrointestinal: no dysphagia or abdominal pain  - Musculoskeletal: no joint pains or weakness  - Integumentary: no rashes  - Neurological: no numbness, tingling, or headaches  - Psychiatric: no depression or anxiety  - Endocrine: no heat or cold intolerance, no polyuria or polydipsia    Physical Examination:  - Blood pressure 109/50, pulse 86, height 5\' 5"  (1.651 m), weight 144 lb (65.318 kg).  - General: pleasant, well-groomed, no distress, good eye contact  - HEENT:  no scleral/conjunctival injection, EOMI, MMM  - Neck: clavicles are bowed outwards over sternal area, but thyroid is hard to feel as I suspect it has been. Congratulate our player and fans and players. thyromegaly, masses, lymph nodes, or carotid bruits  - Cardiovascular: regular, normal rate, normal S1 and S2, no murmurs/rubs/gallops   - Respiratory: clear to auscultation bilaterally  - Musculoskeletal: no proximal muscle weakness in upper or lower extremities  - Integumentary: no rashes, no edema  - Neurological: reflexes 2+ at biceps, no tremor   - Psychiatric: normal mood and affect    Data Reviewed:   Component      Latest Ref Rng 12/18/2010 11/06/2010 11/06/2010          12:20 PM  1:08 PM  1:08 PM   T4, Free      0.82 - 1.77 ng/dL   1.61   TSH      0.96 - 3.74 uIU/mL 0.52 0.990      Thyroid ultrasound  COMPARISON: 05/25/2009.    FINDINGS:     The gland is enlarged by nodules primarily the right lobe and isthmus, and   the nodules and the gland show interval increase in size.    Largest nodule remains in the lower pole right lobe, currently measuring 2.7   x 2.4 x 2.0 cm, previously 2.3 x 1.8 x 2.3 cm. Right isthmic nodule measures   2.5 x 2.4 x 1.3, previously not discretely measurable. Subcentimeter nodules   are scattered in the remainder of both lobes.    Right thyroid lobe measures approximately 7.0 x 3.2 x 2.7 cm, previously 6.8   x 2.5 x 2.1 cm. Left lobe measures approximately 4.6 x 2.1 x 1.8 cm,   previously 3.9 x 1.7 x 1.5 cm.      IMPRESSION: Enlarging multinodular goiter.    Assessment/Plan:   1. Thyroid goiter   - Right lobe nodules are larger- isthmus and right lob nodules.  - FNA of contigous right thyroid nodule and right slip. Discussed surgery. Reviewed normal labs.  For thoroughness, will evaluate pulmonary function tests to make sure there is not breathing problems.   2. Depression   - Seems to be quite a problem. She cried several ties during the exam.  Recommended she stay with one medication for treatment    3. Anxiety   - Likely would benefit from SSRI, as above.     We spent 45 minutes together face to face and 25 minutes were spent on counseling.    Patient Instructions   Thyroid:  Enlarged with noduled    Function is normal.  Nodules appear benign based on biopsy    Multinodular goiter (large thyroid with many nodules or lumps)    Concerns about a large multinodular goiter:  1. Is it making too much thyroid  hormone?  No labs are normal  2. Is the size causing problems? - Can cause trouble breathing, swallowing, or hoarseness.     - We will evaluate with pulmonary function test.  3. Is it cancer? Biopsy was normal.  We will follow with another ultrasound in 12 months to monitor for changes.        Follow-up Disposition:  Return in about 1 year (around 12/25/2011).    Copy sent to:  Dr Joesph July    Addendum 01/22/2011  PFT results  REASON FOR TEST: Thyroid goiter.   FINDINGS: Spirometry was performed and it reveals:   1. No airflow obstruction.2. Normal FVC.3. Normal flow volume loop.  Patient notified of results via call from nurse and letter.  No evidence of obstruction from goiter.

## 2010-12-31 NOTE — Telephone Encounter (Signed)
Late entry: daughter call on 11/16.

## 2011-01-03 MED ORDER — LORAZEPAM 1 MG TAB
1 mg | ORAL_TABLET | Freq: Three times a day (TID) | ORAL | Status: DC | PRN
Start: 2011-01-03 — End: 2011-01-17

## 2011-01-03 NOTE — Telephone Encounter (Signed)
Ativan 1mg  called into [facility]

## 2011-01-16 NOTE — Telephone Encounter (Signed)
Spoke with pt and pt's granddaughter and pt went to ER at [facility] 01/12/11 for anxiety and would like f/u appt. Informed pt and granddaughter that pt has an appt 02/27/11 at 3:15pm to f/u on anxiety. Will call back if can be seen sooner.

## 2011-01-16 NOTE — Telephone Encounter (Signed)
Message copied by Purvis Sheffield on Tue Jan 16, 2011 11:11 AM  ------       Message from: Margarette Asal       Created: Mon Jan 15, 2011  1:18 PM       Regarding: Joesph July / appt         Dr.Jordan-Sayles  01/15/2011   1:18pm              Patient: Gina Holloway (Apr 30, 1934)              Patient was seen in the ER on Friday and needs to schedule a follow-up appointment ASAP. Please call back to schedule.all              Best # to contact patient: [phone] or [phone] (H)

## 2011-01-16 NOTE — Telephone Encounter (Signed)
Message copied by Purvis Sheffield on Tue Jan 16, 2011  9:53 AM  ------       Message from: Margarette Asal       Created: Mon Jan 15, 2011  1:18 PM       Regarding: Joesph July / appt         Dr.Jordan-Sayles  01/15/2011   1:18pm              Patient: Gina Holloway (1934-12-05)              Patient was seen in the ER on Friday and needs to schedule a follow-up appointment ASAP. Please call back to schedule.all              Best # to contact patient: [phone] or [phone] (H)

## 2011-01-17 MED ORDER — LORAZEPAM 1 MG TAB
1 mg | ORAL_TABLET | ORAL | Status: DC
Start: 2011-01-17 — End: 2011-03-20

## 2011-01-17 NOTE — Procedures (Signed)
Name:       Gina Holloway, Gina Holloway               Study Date:  01/02/2011                                               Ordered By:  Lewayne Bunting, MD  Account #:  [id number]                     Sex:         F  DOB:        [birth year]     Age:   76        Location:    [facility]                                  PULMONARY TEST    STUDY DATE:  01/02/11                 Ref.       Pre-Meas   Pre        Post       Post       Post                                     %Ref.      Meas       %Ref       %Change  SPIROMETRY  FVC (L)      2.30       2.19       95  FEV1 (L)     1.82       2.01       110  FEV1/FVC%    81         92         114  FEF 25-75%   1.82       2.98       163  PEF L/s      5.50       4.61       84  FET 100% (s)            3.44  FIVC (L)     2.30       1.93       84  FIF50% L/s              2.61  FVL ECode               001111  MVV L/min    81         46         57    LUNG         VOLUMES    TLC (L)  VC (L)  IC (L)  FRC PL (L)  Vtg (L)  ERV (L)  RV (L)  RV/TLC %  Vt (L)    DIFFUSION    CAPACITY    DLCO  DL Adj  VA (L)  DLCO/VA  DL/VA Adj  IVC (L)    REASON FOR TEST: Thyroid goiter.  FINDINGS: Spirometry was performed and it reveals:  1. No airflow obstruction.2. Normal FVC.3. Normal flow volume  loop.                  Vickki Muff, M.D.    cc:   Myrna Blazer, MD        Vickki Muff, M.D.      ERG/wmx; D: 01/16/2011 ; T: 01/17/2011  9:50 A; DOC# [id number]; Job#  [id number]

## 2011-01-17 NOTE — Procedures (Signed)
Name:       Drum, Alvira H               Study Date:  01/02/2011                                               Ordered By:  Gregory Cook, MD  Account #:  [id number]                     Sex:         F  DOB:        04/17/1934     Age:   76        Location:    [facility]                                  PULMONARY TEST    STUDY DATE:  01/02/11                 Ref.       Pre-Meas   Pre        Post       Post       Post                                     %Ref.      Meas       %Ref       %Change  SPIROMETRY  FVC (L)      2.30       2.19       95  FEV1 (L)     1.82       2.01       110  FEV1/FVC%    81         92         114  FEF 25-75%   1.82       2.98       163  PEF L/s      5.50       4.61       84  FET 100% (s)            3.44  FIVC (L)     2.30       1.93       84  FIF50% L/s              2.61  FVL ECode               001111  MVV L/min    81         46         57    LUNG         VOLUMES    TLC (L)  VC (L)  IC (L)  FRC PL (L)  Vtg (L)  ERV (L)  RV (L)  RV/TLC %  Vt (L)    DIFFUSION    CAPACITY    DLCO  DL Adj  VA (L)  DLCO/VA  DL/VA Adj  IVC (L)    REASON FOR TEST: Thyroid goiter.  FINDINGS: Spirometry was performed and it reveals:  1. No airflow obstruction.2. Normal FVC.3. Normal flow volume   loop.                  Kayte Borchard R. Iraida Cragin, M.D.    cc:   Gregory D Cook, MD        Avriel Kandel R. Pailyn Bellevue, M.D.      ERG/wmx; D: 01/16/2011 ; T: 01/17/2011  9:50 A; DOC# [id number]; Job#  [id number]

## 2011-01-17 NOTE — Telephone Encounter (Signed)
Called in Ativan 1 mg take (1) tab every 8 hours #30 with 1 refill, ok'd per Dr. Swaziland- Harle Stanford

## 2011-01-18 NOTE — Telephone Encounter (Addendum)
Message copied by Hanley Ben on Thu Jan 18, 2011  1:17 PM  ------       Message from: Lewayne Bunting D       Created: Wed Jan 17, 2011  5:25 PM       Regarding: RE: Test Results         TSH was normal at 0.52.       She should have repeat ultrasound in 1 year prior to follow-up.        ----- Message -----          From: Hanley Ben, LPN          Sent: 01/17/2011   4:54 PM            To: Myrna Blazer, MD       Subject: FW: Test Results                                           Patient called for lab results for TSH, drawn by Dr. Ermalinda Memos on 12/18/10.       ----- Message -----          From: Vicie Mutters          Sent: 01/17/2011   4:49 PM            To: Hanley Ben, LPN       Subject: Test Results                                               Patient called to get thyroid ultrasound results done a few weeks ago back in November at [facility].  Her phone number is:   [phone].              I spoke to patient and read her Dr. Patsey Berthold message and she voiced understanding.

## 2011-01-22 MED ORDER — DEXILANT 60 MG CAPSULE, DELAYED RELEASE
60 mg | ORAL_CAPSULE | ORAL | Status: DC
Start: 2011-01-22 — End: 2011-10-02

## 2011-01-22 MED ORDER — ATORVASTATIN 40 MG TAB
40 mg | ORAL_TABLET | ORAL | Status: DC
Start: 2011-01-22 — End: 2011-12-06

## 2011-01-22 NOTE — Telephone Encounter (Signed)
Message copied by Purvis Sheffield on Mon Jan 22, 2011  3:12 PM  ------       Message from: Margarette Asal       Created: Mon Jan 22, 2011 10:33 AM       Regarding: Joesph July / return call         Dr.Jordan-Sayles  01/22/2011   10:33am              Patient:  Gina Holloway ([birth year])              Patient is requesting a return call back from Terri - patient was crying on the phone & said she was depressed.all              Best # to contact patient: [phone]

## 2011-01-22 NOTE — Telephone Encounter (Signed)
Spoke with pt and informed pt will need to call Dr. Leilani Merl - pt verbalized understanding

## 2011-01-22 NOTE — Telephone Encounter (Addendum)
Message copied by Hanley Ben on Mon Jan 22, 2011  2:55 PM  ------       Message from: Lewayne Bunting D       Created: Mon Jan 22, 2011  9:50 AM         Please call and mail lab letter. I printed it off.              Thank you.         I spoke to patient and read her the lab letter at her request and she voiced understanding, I mailed this to her home address.

## 2011-02-27 LAB — AMB POC URINALYSIS DIP STICK AUTO W/O MICRO
Bilirubin (UA POC): NEGATIVE
Blood (UA POC): NEGATIVE
Glucose (UA POC): NEGATIVE
Ketones (UA POC): NEGATIVE
Nitrites (UA POC): NEGATIVE
Protein (UA POC): NEGATIVE mg/dL
Specific gravity (UA POC): 1.015 (ref 1.001–1.035)
pH (UA POC): 7 (ref 4.6–8.0)

## 2011-02-27 LAB — AMB POC GLUCOSE BLOOD, BY GLUCOSE MONITORING DEVICE

## 2011-02-27 MED ORDER — CITALOPRAM 20 MG TAB
20 mg | ORAL_TABLET | Freq: Every day | ORAL | Status: DC
Start: 2011-02-27 — End: 2011-06-19

## 2011-02-27 NOTE — Progress Notes (Signed)
HISTORY OF PRESENT ILLNESS  Gina Holloway is a 77 y.o. female.  HPI  Follow up on BP and depression.  Has stopped seeing counselor because she felt it was not helping.  Has had reactions to multiple medications given by psych for the depression.  Now just on the ativan.  Patient is very teary and anxious. She said she has been having problems with memory loss, fatigue and anxiety.  Pt c/o feeling confused especially with taking her medicines.  Has no appetite and not eating although her weight is stable.  Feels nausea in the stomach most of the day.  Taking dexilant which also does not seems to be helping stomach issues.  Also feels that she is having trouble with remembering "normal things".  Still not sleeping at night.  Had follow up in November with endo for thyroid.      Patient Active Problem List   Diagnoses Code   ??? HTN (Hypertension) 401.9   ??? GERD (Gastroesophageal Reflux Disease) 530.81   ??? Thyroid Goiter 240.9   ??? Osteoporosis 733.00   ??? Encounter for long-term (current) use of other medications V58.69   ??? Palpitations 785.1   ??? Mixed hyperlipidemia 272.2   ??? Anxiety 300.00   ??? Shortness of breath on exertion 786.05   ??? Depression 311     Current Outpatient Prescriptions   Medication Sig Dispense Refill   ??? valsartan (DIOVAN) 160 mg tablet Take 160 mg by mouth daily.         ??? FERROUS FUMARATE/VIT BCOMP&C (SUPER B COMPLEX PO) Take 1 Tab by mouth daily.         ??? citalopram (CELEXA) 20 mg tablet Take 0.5 Tabs by mouth daily.  15 Tab  3   ??? atorvastatin (LIPITOR) 40 mg tablet TAKE ONE TABLET BY MOUTH IN THE EVENING  90 Tab  3   ??? DEXILANT 60 mg CpDM TAKE ONE CAPSULE BY MOUTH EVERY DAY  30 Cap  6   ??? LORazepam (ATIVAN) 1 mg tablet TAKE ONE TABLET BY MOUTH EVERY 8 HOURS AS NEEDED FOR ANXIETY  30 Tab  1   ??? metoprolol-XL (TOPROL-XL) 25 mg XL tablet Take 1 Tab by mouth daily.  90 Tab  3   ??? aspirin 81 mg Tab Take 81 mg by mouth daily.         Allergies   Allergen Reactions   ??? Biaxin (Clarithromycin)  Unknown (comments)     Pt not sure what type of reaction   ??? Flagyl (Metronidazole) Unknown (comments)     Pt not sure of what type of reaction     Past Medical History   Diagnosis Date   ??? HTN (hypertension) 03/29/2008   ??? Hypercholesterolemia 03/29/2008   ??? GERD (gastroesophageal reflux disease) 03/29/2008   ??? Thyroid goiter 03/29/2008   ??? Osteoporosis 03/29/2008   ??? Arrhythmia    ??? Murmur    ??? Other chest pain    ??? Reflux esophagitis    ??? Anxiety    ??? Essential hypertension      Past Surgical History   Procedure Date   ??? Hx cholecystectomy    ??? Hx tubal ligation    ??? Pr colonoscopy,diagnostic 02/18/2006     Dr Park Breed     Family History   Problem Relation Age of Onset   ??? Heart Disease Sister    ??? Arthritis-osteo Sister      History   Substance Use Topics   ??? Smoking  status: Never Smoker    ??? Smokeless tobacco: Never Used   ??? Alcohol Use: No      Review of Systems   Constitutional: Positive for malaise/fatigue.   HENT: Negative for congestion.    Eyes: Negative for blurred vision.   Respiratory: Negative for cough and shortness of breath.    Cardiovascular: Negative for chest pain, palpitations and leg swelling.   Gastrointestinal: Positive for nausea. Negative for heartburn, vomiting, abdominal pain, diarrhea, constipation, blood in stool and melena.   Genitourinary: Negative for dysuria, urgency and frequency.   Musculoskeletal: Negative for back pain, joint pain and falls.   Neurological: Positive for weakness. Negative for dizziness, tingling and headaches.   Endo/Heme/Allergies: Negative for environmental allergies.   Psychiatric/Behavioral: Positive for depression and memory loss. Negative for suicidal ideas and substance abuse. The patient is nervous/anxious and has insomnia.        Physical Exam   Vitals reviewed.  Constitutional: She appears well-developed and well-nourished.   HENT:   Right Ear: Tympanic membrane and ear canal normal.   Left Ear: Tympanic membrane and ear canal normal.   Nose: No mucosal edema  or rhinorrhea.   Mouth/Throat: Oropharynx is clear and moist and mucous membranes are normal.   Neck: Normal range of motion. Neck supple. No thyromegaly present.   Cardiovascular: Normal rate and regular rhythm.    No murmur heard.  Pulmonary/Chest: Effort normal and breath sounds normal.   Abdominal: Soft. Bowel sounds are normal. There is no tenderness.   Musculoskeletal: Normal range of motion. She exhibits no edema.   Lymphadenopathy:     She has no cervical adenopathy.   Neurological:        Seems to have involuntary jerking of the upper extremities.  Happened at least 3 times when pt was taking but she was also very anxious and crying.   Skin: Skin is warm and dry.   Psychiatric: She has a normal mood and affect.       ASSESSMENT and PLAN  Jori was seen today for anxiety, memory loss, insomnia and eating concern.    Diagnoses and associated orders for this visit:    Htn (hypertension)  - valsartan (DIOVAN) 160 mg tablet; Take 160 mg by mouth daily.      Mixed hyperlipidemia    Hyperglycemia  - AMB POC GLUCOSE BLOOD, BY GLUCOSE MONITORING DEVICE  - HEMOGLOBIN A1C    Depression//Anxiety  - citalopram (CELEXA) 20 mg tablet; Take 0.5 Tabs by mouth daily.  - MRI BRAIN W WO CONT; Future  - REFERRAL TO NEUROLOGY    Episode of jerking  - MRI BRAIN W WO CONT; Future  - MAGNESIUM  - REFERRAL TO NEUROLOGY    Memory loss  - MRI BRAIN W WO CONT; Future  - REFERRAL TO NEUROLOGY    Thyroid goiter  As per endo    Encounter for long-term (current) use of other medications  - AMB POC URINALYSIS DIP STICK AUTO W/ MICRO  - METABOLIC PANEL, BASIC    Other Orders  - FERROUS FUMARATE/VIT BCOMP&C (SUPER B COMPLEX PO); Take 1 Tab by mouth daily.          Follow-up Disposition:  Return in about 6 weeks (around 04/10/2011).  reviewed diet, exercise and weight control  cardiovascular risk and specific lipid/LDL goals reviewed  reviewed medications and side effects in detail

## 2011-02-27 NOTE — Progress Notes (Signed)
Patient is very teary and anxious. She said she has been having problems with memory loss, fatigue and anxiety. While talking to the patient, I noticed she was having seizure like movements; she said that had been going on for about a week. She said she has no appetite and when she does force herself to eat, she feels like she is going to vomit. She is easily confused, especially by her medicines. She said she has "bags" of pills at home that her psychiatrist gave her that she is not taking due to adverse effects, but she does not know the names of the medications.

## 2011-02-27 NOTE — Progress Notes (Signed)
Addended by: Reather Laurence on: 02/27/2011 04:34 PM     Modules accepted: Orders

## 2011-02-28 ENCOUNTER — Encounter

## 2011-02-28 LAB — METABOLIC PANEL, BASIC
BUN/Creatinine ratio: 14 (ref 11–26)
BUN: 14 mg/dL (ref 8–27)
CO2: 24 mmol/L (ref 20–32)
Calcium: 9.8 mg/dL (ref 8.6–10.2)
Chloride: 100 mmol/L (ref 97–108)
Creatinine: 0.99 mg/dL (ref 0.57–1.00)
GFR est non-AA: 56 mL/min/{1.73_m2} — ABNORMAL LOW (ref >59)
Glucose: 88 mg/dL (ref 65–99)
Potassium: 4.5 mmol/L (ref 3.5–5.2)
Sodium: 141 mmol/L (ref 134–144)
eGFR If African American: 65 mL/min/{1.73_m2} (ref >59)

## 2011-02-28 LAB — MAGNESIUM: Magnesium: 2.1 mg/dL (ref 1.6–2.6)

## 2011-02-28 LAB — HEMOGLOBIN A1C WITH EAG: Hemoglobin A1c: 5.9 % — ABNORMAL HIGH (ref 4.8–5.6)

## 2011-02-28 MED ORDER — METOPROLOL SUCCINATE SR 25 MG 24 HR TAB
25 mg | ORAL_TABLET | Freq: Every day | ORAL | Status: DC
Start: 2011-02-28 — End: 2012-03-21

## 2011-02-28 NOTE — Telephone Encounter (Signed)
Called pt's grandson and informed that pt can take the Ativan for anxiety and informed grandson will contact pt as well.    Called pt and informed pt can take Ativan for anxiety. Pt verbalized understanding

## 2011-02-28 NOTE — Telephone Encounter (Signed)
Message copied by Purvis Sheffield on Wed Feb 28, 2011  4:11 PM  ------       Message from: Margarette Asal       Created: Wed Feb 28, 2011  1:21 PM       Regarding: Joesph July / return call         Dr. Joesph July 02/28/2011   1:21pm              Patient:  Gina Holloway ([birth year])              Patient's grandson Doy Hutching) is requesting a return call back from the nurse. He needs to know all of the medications that his grandmother should be taking. Please call back to advise.all              Best # to contact: Molly Maduro Small-grandson) [phone]

## 2011-03-05 NOTE — Telephone Encounter (Signed)
Message copied by Purvis Sheffield on Mon Mar 05, 2011 11:20 AM  ------       Message from: Natasha Bence P       Created: Mon Mar 05, 2011  9:47 AM       Regarding: Gina Holloway         Patient states dr Joesph July wants there to have a mri but she cannot go in that machine, so they told her that dr Joesph July could give her a pill because last time they had to get her out of the machine . Please call patient back thanks              Patient phone number [phone]

## 2011-03-05 NOTE — Telephone Encounter (Signed)
Spoke with pt and pt states she needs to take a medication to help relax her before having the MRI. Informed pt can take Ativan 30 minutes prior to having MRI and to make sure someone will be driving. Pt verbalized her daughter or someone would be taking her and verbalized understanding

## 2011-03-08 NOTE — Telephone Encounter (Signed)
Called the patient's caregiver, and her phone was disconnected; called the patient's number and got no answer. I left a message for the caregiver or the patient to return my call.

## 2011-03-08 NOTE — Telephone Encounter (Signed)
Message copied by Ahmed Prima on Thu Mar 08, 2011  3:42 PM  ------       Message from: Natasha Bence P       Created: Thu Mar 08, 2011 12:37 PM       Regarding: jordan-sayles         Patients care giver carolyn Raelyn Ensign states she thinks patient is having a drug interaction, she is off balance, no appetite, has crying spells, no energy and a lot of anxiety. Please call back wants her to see dr Joesph July Monday morning. Thanks              Patient phone number [phone]       Nelle Don [phone]

## 2011-03-19 NOTE — Telephone Encounter (Signed)
Message copied by Purvis Sheffield on Mon Mar 19, 2011 12:58 PM  ------       Message from: Natasha Bence P       Created: Mon Mar 19, 2011 10:13 AM       Regarding: Gina Holloway         Patient states she thinks she had a reaction to the citalopram, it started Thursday or Saturday she got red knots on her leg and thigh so she stopped taking the pills she only had a few left anyway, please call patient back she still cannot sleep, thanks              Patient phone number [phone]

## 2011-03-19 NOTE — Telephone Encounter (Signed)
Spoke with pt and states she has started with a rash since starting Citalopram and has stooped and rash is going away. Pt also stated she has been having chest pain, dizziness, and rapid heart rate most of the morning. Informed pt when hanging up need to call 911- pt verbalized she would call.

## 2011-03-20 MED ORDER — LORAZEPAM 1 MG TAB
1 mg | ORAL_TABLET | Freq: Three times a day (TID) | ORAL | Status: DC | PRN
Start: 2011-03-20 — End: 2011-06-19

## 2011-03-20 NOTE — Telephone Encounter (Signed)
Phoned in RX

## 2011-03-30 MED ORDER — HYDROXYZINE 50 MG TAB
50 mg | ORAL_TABLET | Freq: Four times a day (QID) | ORAL | Status: AC | PRN
Start: 2011-03-30 — End: 2011-04-09

## 2011-03-30 NOTE — ED Notes (Signed)
Dr Murphy reviewed discharge instructions with the patient.  The patient and family verbalized understanding.

## 2011-03-30 NOTE — ED Notes (Signed)
Pt denies tongue or throat swelling

## 2011-03-30 NOTE — ED Notes (Signed)
Assumed care of patient from triage. Placed in position of comfort. Call bell in reach. Pt is in no apparent distress. Respirations even, unlabored, and regular. Alert and active. Assessment completed at this time.    Pt present to ED with facial swelling since yesterday.

## 2011-03-30 NOTE — ED Provider Notes (Signed)
HPI Comments: Gina Holloway is a 77 y.o. female with significant hx of HTN, high cholesterol, GERD presenting ambulatory to ED with C/O bilateral eye swelling x yesterday.  The patient reports hx of anxiety attacks and states she has tried several anxiety medications with no relief.  She notes some edema last week, which she states she has resolved after she stopped taking an anxiety mediation, stating she threw ito ut.  Today she states she has red spots that itch on her body, and reports swelling of her eyelids with associated blurred vision in her R eye.  She states she is unsure if the blurred vision is from the swelling or something else. She denies having any contact with animals, denies having pets.  Pt specifically denies F/C, N/V/D, abd pain, CP, SOB, neck pain, back pain, changes in bladder function, HA.     PCP: Paschal Dopp, MD   Surgical hx significant for cholecystectomy, BTL  Allergies: biaxin, flagyl  Social: -tob, -etoh, -drugs    There are no other complaints, changes or physical findings at this time. 12:41 PM   Written by Gerhard Munch, ED Scribe, as dictated by Alden Benjamin, MD.        The history is provided by the patient.        Past Medical History   Diagnosis Date   ??? HTN (hypertension) 03/29/2008   ??? Hypercholesterolemia 03/29/2008   ??? GERD (gastroesophageal reflux disease) 03/29/2008   ??? Thyroid goiter 03/29/2008   ??? Osteoporosis 03/29/2008   ??? Arrhythmia    ??? Murmur    ??? Other chest pain    ??? Reflux esophagitis    ??? Anxiety    ??? Essential hypertension         Past Surgical History   Procedure Date   ??? Hx cholecystectomy    ??? Hx tubal ligation    ??? Pr colonoscopy,diagnostic 02/18/2006     Dr Park Breed         Family History   Problem Relation Age of Onset   ??? Heart Disease Sister    ??? Arthritis-osteo Sister         History     Social History   ??? Marital Status: DIVORCED     Spouse Name: N/A     Number of Children: N/A   ??? Years of Education: N/A     Occupational History   ??? Not  on file.     Social History Main Topics   ??? Smoking status: Never Smoker    ??? Smokeless tobacco: Never Used   ??? Alcohol Use: No   ??? Drug Use: No   ??? Sexually Active: No     Other Topics Concern   ??? Not on file     Social History Narrative    ** Merged History Encounter **                   ALLERGIES: Biaxin and Flagyl      Review of Systems   Constitutional: Negative.  Negative for fever and chills.   HENT: Negative.  Negative for neck pain.    Eyes: Positive for visual disturbance (blurred).        Eyelid swelling   Respiratory: Negative.  Negative for cough and shortness of breath.    Cardiovascular: Negative.  Negative for chest pain.   Gastrointestinal: Negative.  Negative for nausea, vomiting, abdominal pain and diarrhea.   Genitourinary: Negative.    Musculoskeletal:  Negative.  Negative for back pain.   Skin: Positive for rash.   Neurological: Negative.  Negative for headaches.   All other systems reviewed and are negative.        Filed Vitals:    03/30/11 1146   BP: 148/75   Pulse: 108   Temp: 97.7 ??F (36.5 ??C)   Resp: 18   Height: 5\' 5"  (1.651 m)   Weight: 64.7 kg (142 lb 10.2 oz)   SpO2: 99%            Physical Exam   Nursing note and vitals reviewed.  Constitutional: She is oriented to person, place, and time. She appears well-developed and well-nourished. No distress.   HENT:   Mouth/Throat: Oropharynx is clear and moist.   Eyes: EOM are normal. Pupils are equal, round, and reactive to light.        No change in vision when upper eyelid lifted   Neck: Normal range of motion. Neck supple. No tracheal deviation present.   Cardiovascular: Normal rate, regular rhythm, normal heart sounds and intact distal pulses.  Exam reveals no gallop and no friction rub.    No murmur heard.  Pulmonary/Chest: Effort normal and breath sounds normal. No respiratory distress. She has no wheezes. She has no rales. She exhibits no tenderness.   Abdominal: Soft. She exhibits no distension and no mass. There is no tenderness.  There is no rebound and no guarding.   Musculoskeletal: Normal range of motion. She exhibits no edema.   Lymphadenopathy:     She has no cervical adenopathy.   Neurological: She is alert and oriented to person, place, and time. She has normal strength. No cranial nerve deficit.        No focal neuro deficit   Skin: Skin is warm and dry. No rash noted.        Pt with multiple small areas of raised erythema consistent with insect bites - right eyelid, nape of neck, back, left wrist, chest wall; no signs of active infestation   Psychiatric: She has a normal mood and affect.        MDM     Differential Diagnosis; Clinical Impression; Plan:     Pt what appears to be insect bites - there has been no travel, no pets, no signs of bed bugs at home. Will try treating with hydroxyzine as this will also help with her anxiety. There does not appear to be any signs of superinfection at any of these bites.  The patient has no SI/HI      Procedures    LABORATORY TESTS:  No results found for this or any previous visit (from the past 12 hour(s)).    IMAGING RESULTS:    MEDICATIONS GIVEN:    Medications   hydrOXYzine (ATARAX) 50 mg tablet (not administered)       IMPRESSION:  1. Insect bites    2. Itching        PLAN:  1. Hydroxyzine  2. PCP follow up  3. Look for source  Return to ED if worse    2:15 PM  Willodean Rosenthal Taboada's  results have been reviewed with her.  She has been counseled regarding her diagnosis.  She verbally conveys understanding and agreement of the signs, symptoms, diagnosis, treatment and prognosis and additionally agrees to follow up as recommended with Dr. Paschal Dopp, MD in 24 - 48 hours.  She also agrees with the care-plan and conveys that all of her questions have  been answered.  I have also put together some discharge instructions for her that include: 1) educational information regarding their diagnosis, 2) how to care for their diagnosis at home, as well a 3) list of reasons why they would want to  return to the ED prior to their follow-up appointment, should their condition change.

## 2011-04-02 NOTE — Progress Notes (Signed)
Patent was discharged on 03/30/11 from  [facility]. Attempted to contact patient. Will send [employer] letter.     Isao Seltzer RN

## 2011-04-09 NOTE — Progress Notes (Signed)
Patient returned the call. Left message at patient's phone # with contact information.    Susa Simmonds RN

## 2011-04-11 NOTE — Progress Notes (Signed)
Patient discharged on 03/30/11 from [facility]. I spoke with the patient today. [place] with her daughterIdentified patient by DOB and reviewed part of the home medication and patient verbalizes understanding self management of medications. Reviewed red flags for insect bites and patient understands when to seek medical attention from PCP.  Patient also reports that she was recently discharged from [facility] with "anxiety attacks" and has other medication that she is on.She is unable to get to these medications at this time, but has the contact information to call back for medication update.   Patient's next follow up visit is not scheduled for yet.Patient verbalizes understanding of discharge plan and has Nurse Navigator's contact information to call as needed.  Reviewed plan of care. Patient has provided input to plan and agrees with goals.    Reminded her of colonoscopy and the flu vaccine.    Susa Simmonds RN

## 2011-06-01 NOTE — Telephone Encounter (Signed)
Returned [employer] call; left message for her to call back.

## 2011-06-01 NOTE — Telephone Encounter (Signed)
Message copied by Ahmed Prima on Fri Jun 01, 2011  4:44 PM  ------       Message from: Toy Baker       Created: Fri Jun 01, 2011  2:02 PM       Regarding: jordan-sayles                       ----- Message -----          From: Rolland Bimler          Sent: 06/01/2011   1:58 PM            To: [employer] Front Office Pool       Subject: Dr. Joesph July Telephone                               Patient's daughter Nettie Elm, would like a call back from the doctor regarding a note for her mother's condition. Best contact number for Nettie Elm is [phone] or [phone](afer 3:30pm)

## 2011-06-19 NOTE — Progress Notes (Signed)
HISTORY OF PRESENT ILLNESS  Gina Holloway is a 77 y.o. female.  HPI  Follow up on BP.  Continues with seeing psychiatrist every 2 weeks for depression and anxiety.  Only taking Remeron now for the insomnia which is not helping.  Has appt for thisThursday for follow up.    HTN follow up:  Compliant w/ meds, low salt diet, and exercise.  No home bp monitoring.  No swelling, headache or dizziness.  No chest pain, SOB, palpitations.  Otherwise feeling well since the last visit    Thyroid Review:  Patient is seen for followup of goiter.   Thyroid ROS: denies fatigue, weight changes, heat/cold intolerance, bowel/skin changes or CVS symptoms.  Patient Active Problem List   Diagnoses Code   ??? HTN (Hypertension) 401.9   ??? GERD (Gastroesophageal Reflux Disease) 530.81   ??? Thyroid Goiter 240.9   ??? Osteoporosis 733.00   ??? Encounter for long-term (current) use of other medications V58.69   ??? Palpitations 785.1   ??? Mixed hyperlipidemia 272.2   ??? Anxiety 300.00   ??? Shortness of breath on exertion 786.05   ??? Depression 311     Current Outpatient Prescriptions   Medication Sig Dispense Refill   ??? mirtazapine (REMERON) 30 mg tablet Take 30 mg by mouth nightly.       ??? cyanocobalamin (VITAMIN B-12) 500 mcg tablet Take 500 mcg by mouth daily.       ??? loratadine (CLARITIN) 10 mg tablet Take 1 Tab by mouth daily as needed for Allergies.  30 Tab  11   ??? metoprolol-XL (TOPROL-XL) 25 mg XL tablet Take 1 Tab by mouth daily.  90 Tab  3   ??? valsartan (DIOVAN) 160 mg tablet Take 160 mg by mouth daily.         ??? atorvastatin (LIPITOR) 40 mg tablet TAKE ONE TABLET BY MOUTH IN THE EVENING  90 Tab  3   ??? DEXILANT 60 mg CpDM TAKE ONE CAPSULE BY MOUTH EVERY DAY  30 Cap  6   ??? aspirin 81 mg Tab Take 81 mg by mouth daily.         Allergies   Allergen Reactions   ??? Biaxin (Clarithromycin) Unknown (comments)     Pt not sure what type of reaction   ??? Flagyl (Metronidazole) Unknown (comments)     Pt not sure of what type of reaction     Past Medical  History   Diagnosis Date   ??? HTN (hypertension) 03/29/2008   ??? Hypercholesterolemia 03/29/2008   ??? GERD (gastroesophageal reflux disease) 03/29/2008   ??? Thyroid goiter 03/29/2008   ??? Osteoporosis 03/29/2008   ??? Arrhythmia    ??? Murmur    ??? Other chest pain    ??? Reflux esophagitis    ??? Anxiety    ??? Essential hypertension      Past Surgical History   Procedure Date   ??? Hx cholecystectomy    ??? Hx tubal ligation    ??? Pr colonoscopy,diagnostic 02/18/2006     Dr Park Breed     Family History   Problem Relation Age of Onset   ??? Heart Disease Sister    ??? Arthritis-osteo Sister      History   Substance Use Topics   ??? Smoking status: Never Smoker    ??? Smokeless tobacco: Never Used   ??? Alcohol Use: No      Review of Systems   Constitutional: Negative for malaise/fatigue.   HENT: Negative for  congestion.    Eyes: Negative for blurred vision.   Respiratory: Negative for cough and shortness of breath.    Cardiovascular: Negative for chest pain, palpitations and leg swelling.   Gastrointestinal: Negative for heartburn, abdominal pain and constipation.   Genitourinary: Negative for dysuria, urgency and frequency.   Musculoskeletal: Negative for back pain and joint pain.   Neurological: Negative for dizziness, tingling and headaches.   Endo/Heme/Allergies: Negative for environmental allergies.   Psychiatric/Behavioral: Negative for depression. The patient does not have insomnia.        Physical Exam   Vitals reviewed.  Constitutional: She appears well-developed and well-nourished.   HENT:   Right Ear: Tympanic membrane and ear canal normal.   Left Ear: Tympanic membrane and ear canal normal.   Nose: No mucosal edema or rhinorrhea.   Mouth/Throat: Oropharynx is clear and moist and mucous membranes are normal.   Neck: Normal range of motion. Neck supple. No thyromegaly present.   Cardiovascular: Normal rate and regular rhythm.    No murmur heard.  Pulmonary/Chest: Effort normal and breath sounds normal.   Abdominal: Soft. Bowel sounds are normal.  There is no tenderness.   Musculoskeletal: Normal range of motion. She exhibits no edema.   Lymphadenopathy:     She has no cervical adenopathy.   Skin: Skin is warm and dry.   Psychiatric: Her speech is normal. Judgment and thought content normal. She is slowed. Cognition and memory are normal. She exhibits a depressed mood.       ASSESSMENT and PLAN  Ailie was seen today for hypertension and cholesterol problem.    Diagnoses and associated orders for this visit:    Htn (hypertension)  stable    Mixed hyperlipidemia  Stable lipids.   Will check at next OV.      Depression//Anxiety  Follow up with psych.  - mirtazapine (REMERON) 30 mg tablet; Take 30 mg by mouth nightly.    Environmental allergies  - loratadine (CLARITIN) 10 mg tablet; Take 1 Tab by mouth daily as needed for Allergies.    Encounter for long-term (current) use of other medications    Other Orders  - cyanocobalamin (VITAMIN B-12) 500 mcg tablet; Take 500 mcg by mouth daily.        Follow-up Disposition:  Return in about 3 months (around 09/19/2011).  reviewed diet, exercise and weight control  cardiovascular risk and specific lipid/LDL goals reviewed

## 2011-06-19 NOTE — Progress Notes (Signed)
Patient aware her colonoscopy is due.

## 2011-06-19 NOTE — Telephone Encounter (Signed)
Message copied by Purvis Sheffield on Tue Jun 19, 2011  4:22 PM  ------       Message from: Teresa Pelton       Created: Tue Jun 19, 2011  3:56 PM       Regarding: Jordan-Sayles         Dr.Jordan-Sayles 06/19/2011 time 3:57pm dob [birth year]                        The patient wants a return call regarding her appt with the Psychiatrist, she has a appt on this Thursday & wants you to talk with him before she goes to see him. Phone [phone]    djb

## 2011-06-19 NOTE — Telephone Encounter (Signed)
Pt states her appt with her psychiatrist is this Thursday and wondering if Dr. Swaziland would be able to call him to discuss her medications prior to her appt. Will inform Dr. Swaziland.

## 2011-06-27 NOTE — Telephone Encounter (Signed)
Called pt and informed pt to call psychiatrist per Dr. Swaziland- pt verbalized understanding and stated she would call her psychiatrist

## 2011-06-27 NOTE — Telephone Encounter (Signed)
Message copied by Purvis Sheffield on Wed Jun 27, 2011  2:24 PM  ------       Message from: Natasha Bence P       Created: Wed Jun 27, 2011  2:07 PM       Regarding: jordan-sayles                       ----- Message -----          From: Mary Clarke-Campbell          Sent: 06/27/2011   1:57 PM            To: [employer] Front Office Pool       Subject: Dr. Chauncey Cruel                                Pt is requesting a call back about pt's state of anxiety and depression. Pt states that pt just feels nervous, jittery and weak.  Pt sounds rather emotional.  Pt can be reached at [phone]. Per Marylene Land send a case.

## 2011-10-02 MED ORDER — LORAZEPAM 1 MG TAB
1 mg | ORAL_TABLET | ORAL | Status: DC
Start: 2011-10-02 — End: 2011-12-18

## 2011-10-03 LAB — METABOLIC PANEL, COMPREHENSIVE
A-G Ratio: 1.6 (ref 1.1–2.5)
ALT (SGPT): 20 IU/L (ref 0–32)
AST (SGOT): 24 IU/L (ref 0–40)
Albumin: 3.9 g/dL (ref 3.5–4.8)
Alk. phosphatase: 68 IU/L (ref 45–108)
BUN/Creatinine ratio: 15 (ref 11–26)
BUN: 16 mg/dL (ref 8–27)
Bilirubin, total: 0.2 mg/dL (ref 0.0–1.2)
CO2: 28 mmol/L (ref 19–28)
Calcium: 9.7 mg/dL (ref 8.6–10.2)
Chloride: 103 mmol/L (ref 97–108)
Creatinine: 1.1 mg/dL — ABNORMAL HIGH (ref 0.57–1.00)
GFR est non-AA: 49 mL/min/{1.73_m2} — ABNORMAL LOW (ref >59)
GLOBULIN, TOTAL: 2.4 g/dL (ref 1.5–4.5)
Glucose: 106 mg/dL — ABNORMAL HIGH (ref 65–99)
Potassium: 4.6 mmol/L (ref 3.5–5.2)
Protein, total: 6.3 g/dL (ref 6.0–8.5)
Sodium: 143 mmol/L (ref 134–144)
eGFR If African American: 57 mL/min/{1.73_m2} — ABNORMAL LOW (ref >59)

## 2011-10-03 LAB — LIPID PANEL
Cholesterol, total: 156 mg/dL (ref 100–199)
HDL Cholesterol: 62 mg/dL (ref >39)
LDL, calculated: 66 mg/dL (ref 0–99)
Triglyceride: 142 mg/dL (ref 0–149)
VLDL, calculated: 28 mg/dL (ref 5–40)

## 2011-10-03 LAB — HEMOGLOBIN A1C WITH EAG: Hemoglobin A1c: 5.8 % — ABNORMAL HIGH (ref 4.8–5.6)

## 2011-10-03 NOTE — Telephone Encounter (Signed)
Called pt and per Dr. Swaziland take 1/2 pill instead of 1 pill. Pt verbalized understanding

## 2011-10-03 NOTE — Telephone Encounter (Signed)
Message copied by Purvis Sheffield on Wed Oct 03, 2011  9:09 AM  ------       Message from: Natasha Bence P       Created: Wed Oct 03, 2011  8:33 AM       Regarding: jordan-sayles         Patient states dr Joesph July gave her a rx for LORazepam (ATIVAN) 1 mg tablet, the first pill she took was ok but after the second one she took she cant remember anything, and she is upset, please call patient back asap thanks              Patient phone number [phone]

## 2011-10-04 NOTE — Progress Notes (Signed)
HISTORY OF PRESENT ILLNESS  Gina Holloway is a 77 y.o. female.  HPI  Follow up on BP.  Hasn't seen psychiatrist in the last month because she did not feel better.  Still  taking Remeron.  Still feeling very anxious thru out the day.  Wants rx to help with the anxiety.    HTN follow up:  Compliant w/ meds, low salt diet, and exercise.  No home bp monitoring.  No swelling, headache or dizziness.  No chest pain, SOB, palpitations.  Otherwise feeling well since the last visit.  Thyroid Review:  Patient is seen for followup of goiter.   Thyroid ROS: denies fatigue, weight changes, heat/cold intolerance, bowel/skin changes or CVS symptoms.  Glucose intolerance reveiw:  She has IGT.  Diabetic ROS - further diabetic ROS: no polyuria or polydipsia, no chest pain, dyspnea or TIA's, no numbness, tingling or pain in extremities, no unusual visual symptoms.   Lab review: orders written for new lab studies as appropriate; see orders.       Patient Active Problem List   Diagnosis Code   ??? HTN (Hypertension) 401.9   ??? GERD (Gastroesophageal Reflux Disease) 530.81   ??? Thyroid Goiter 240.9   ??? Osteoporosis 733.00   ??? Encounter for long-term (current) use of other medications V58.69   ??? Palpitations 785.1   ??? Mixed hyperlipidemia 272.2   ??? Anxiety 300.00   ??? Shortness of breath on exertion 786.05   ??? Depression 311     Current Outpatient Prescriptions   Medication Sig Dispense Refill   ??? LORazepam (ATIVAN) 1 mg tablet Take one pill each morning and one pill each afternoon between 2 and 5 pm for anxiety  60 Tab  1   ??? mirtazapine (REMERON) 30 mg tablet Take 30 mg by mouth nightly.       ??? cyanocobalamin (VITAMIN B-12) 500 mcg tablet Take 500 mcg by mouth daily.       ??? loratadine (CLARITIN) 10 mg tablet Take 1 Tab by mouth daily as needed for Allergies.  30 Tab  11   ??? metoprolol-XL (TOPROL-XL) 25 mg XL tablet Take 1 Tab by mouth daily.  90 Tab  3   ??? valsartan (DIOVAN) 160 mg tablet Take 160 mg by mouth daily.         ???  atorvastatin (LIPITOR) 40 mg tablet TAKE ONE TABLET BY MOUTH IN THE EVENING  90 Tab  3   ??? aspirin 81 mg Tab Take 81 mg by mouth daily.         Allergies   Allergen Reactions   ??? Biaxin (Clarithromycin) Unknown (comments)     Pt not sure what type of reaction   ??? Flagyl (Metronidazole) Unknown (comments)     Pt not sure of what type of reaction     Past Medical History   Diagnosis Date   ??? HTN (hypertension) 03/29/2008   ??? Hypercholesterolemia 03/29/2008   ??? GERD (gastroesophageal reflux disease) 03/29/2008   ??? Thyroid goiter 03/29/2008   ??? Osteoporosis 03/29/2008   ??? Arrhythmia    ??? Murmur    ??? Other chest pain    ??? Reflux esophagitis    ??? Anxiety    ??? Essential hypertension      Past Surgical History   Procedure Date   ??? Hx cholecystectomy    ??? Hx tubal ligation    ??? Pr colonoscopy,diagnostic 02/18/2006     Dr Park Breed     Family History  Problem Relation Age of Onset   ??? Heart Disease Sister    ??? Arthritis-osteo Sister      History   Substance Use Topics   ??? Smoking status: Never Smoker    ??? Smokeless tobacco: Never Used   ??? Alcohol Use: No      Review of Systems   Constitutional: Negative for malaise/fatigue.   HENT: Negative for congestion.    Eyes: Negative for blurred vision.   Respiratory: Negative for cough and shortness of breath.    Cardiovascular: Negative for chest pain, palpitations and leg swelling.   Gastrointestinal: Negative for heartburn, abdominal pain and constipation.   Genitourinary: Negative for dysuria, urgency and frequency.   Musculoskeletal: Negative for back pain and joint pain.   Neurological: Negative for dizziness, tingling and headaches.   Endo/Heme/Allergies: Negative for environmental allergies.   Psychiatric/Behavioral: Negative for depression. The patient does not have insomnia.        Physical Exam   Vitals reviewed.  Constitutional: She is oriented to person, place, and time. She appears well-developed and well-nourished.   HENT:   Right Ear: Tympanic membrane and ear canal normal.    Left Ear: Tympanic membrane and ear canal normal.   Nose: No mucosal edema or rhinorrhea.   Mouth/Throat: Oropharynx is clear and moist and mucous membranes are normal.   Neck: Normal range of motion. Neck supple. No thyromegaly present.   Cardiovascular: Normal rate and regular rhythm.    No murmur heard.  Pulmonary/Chest: Effort normal and breath sounds normal.   Abdominal: Soft. Bowel sounds are normal. There is no tenderness.   Musculoskeletal: Normal range of motion. She exhibits no edema.   Lymphadenopathy:     She has no cervical adenopathy.   Neurological: She is alert and oriented to person, place, and time. She displays normal reflexes. Coordination normal.   Skin: Skin is warm and dry.   Psychiatric: She has a normal mood and affect. Her behavior is normal. Judgment and thought content normal.        Looks brighter and less depressed today.         ASSESSMENT and PLAN  Gina Holloway was seen today for hypertension and anxiety.    Diagnoses and associated orders for this visit:    Htn (hypertension)  Stable  Discussed sodium restriction, high k rich diet, maintaining ideal body weight and regular exercise program such as daily walking 30 min perday 4-5 times per week, as physiologic means to achieve blood pressure control.?? Medication compliance advised.     Igt (impaired glucose tolerance)  - HEMOGLOBIN A1C    Mixed hyperlipidemia  - LIPID PANEL    Anxiety//  Depression  - LORazepam (ATIVAN) 1 mg tablet; Take one pill each morning and one pill each afternoon between 2 and 5 pm for anxiety    Encounter for long-term (current) use of other medications  - METABOLIC PANEL, COMPREHENSIVE        Follow-up Disposition:  Return in about 2 months (around 12/02/2011).  reviewed diet, exercise and weight control  cardiovascular risk and specific lipid/LDL goals reviewed  specific diabetic recommendations: low cholesterol diet, weight control and daily exercise discussed and glycohemoglobin and other lab monitoring  discussed

## 2011-10-11 NOTE — Telephone Encounter (Signed)
Message copied by Ahmed Prima on Thu Oct 11, 2011  4:37 PM  ------       Message from: Teresa Pelton       Created: Thu Oct 11, 2011  1:19 PM       Regarding: Jordan-Sayles         Dr.Jordan-Sayles 10/11/2011 time 1:20pm dob [birth year]                      The patient wants a return call regarding the medication Mirtazapine she thinks that is making her jittery.  she said the medication LORazepam (ATIVAN) 1 mg tablet that is helping. Please give her a call.  Phone [phone]   djb

## 2011-10-11 NOTE — Telephone Encounter (Signed)
Spoke with the patient; she said the Mirtazapine is making her very jittery. She said she did well when she was taking just the Lorazepam. I told her to discontinue the Mirtzapine for now, and I will send the message to Dr Joesph July for further consideration.

## 2011-10-22 NOTE — Telephone Encounter (Signed)
Patient's daughter called / mother conferenced in. Answered all questions regarding her medication (same as previous message on 9/5)

## 2011-10-22 NOTE — Telephone Encounter (Signed)
Message copied by Ahmed Prima on Mon Oct 22, 2011  6:04 PM  ------       Message from: Sherilyn Dacosta D       Created: Mon Oct 22, 2011  4:01 PM       Regarding: Joesph July- return call       Contact: [phone]         Dr. Joesph July  10/22/2011  4:01pm  Jun 05, 1934              Patient's daughter, [name], called on her behalf requesting a return call regarding the medication,  LORazepam (ATIVAN) 1 mg tablet.  Please return call to discuss [phone] (patient) or [phone] (daughter).       rda

## 2011-11-08 NOTE — Telephone Encounter (Signed)
Message copied by Margarette Asal on Thu Nov 08, 2011  2:13 PM  ------       Message from: Gloriajean Dell       Created: Thu Nov 08, 2011  1:46 PM       Regarding: Dr. Joesph July telephone         Please call the patient's daughter Diane at [phone].  She needs to know if the doctor can test her mother for Alzheimer's, and or Dementia.  She may not be able to answer the phone so just leave the answer on her voice mail.

## 2011-11-09 NOTE — Telephone Encounter (Signed)
Called pt's daughter, Lady Deutscher that Dr. Swaziland had referred pt to neurologist, Dr. Piedad Climes, back in January and pt had appt 03/19/2011-  need to call office and set up appt with Dr. Piedad Climes. Any questions please call back.

## 2011-11-09 NOTE — Telephone Encounter (Signed)
Patients daughter [name] would like to speak with you regarding getting her mother a appt. She said she has been for getting things quite a bit.  Please give her a return call.   Phone [phone]

## 2011-11-09 NOTE — Telephone Encounter (Signed)
Called pt's daughter LM returning call

## 2011-12-05 NOTE — Progress Notes (Signed)
[facility] NEUROLOGY [place], [facility]   [address]   [place], [place] [address]   [phone] Office   [phone] Fax      Neuropsychology    Initial Diagnostic Interview Note (12/05/11)      Referral:  Paschal Dopp, MD    Gina Holloway is a 77 y.o. right handed widowed African American female  who was unaccompanied to the initial clinical interview.   She completed the 11th grade, and retired from [employer]  She lives alone.  Please refer to her medical records for details pertaining to her history.  Briefly, the patient reported that she has noticed a progressive decline in short term memory.  Misplaces things.  Forgets the content of conversations.  Mind sometimes goes "completely blank."  Problems have been progressively worsening over the past year or so, but she has noticed some short term memory problems multiple years prior to that, but more significant of late.  She cannot sleep.  Appetite is no good.  She has lost quite a bit of weight.  One bowl of oatmeal will keep her all day long.  She can get a bit dizzy when she first gets up, but no falls.  She has not been driving because she feels very nervous.  Depression and anxiety also feels to her as though they have been worse over the past year.  Saw Psychiatry and attended counseling in the past, but counseling not helpful.  She really wants medication to help with her mood.  Does not do anything to relax.  Used to be involved in the senior citizen center.    No previous neuropsych testing.      Historian: Good  Praxis: No UE apraxia  R/L Orientation: Intact to self and to other  Dress: within normal limits   Weight: within normal limits   Appearance/Hygiene: within normal limits   Gait: slow and cautious   Assistive Devices: Glasses  Mood: within normal limits   Affect: within normal limits   Comprehension: within normal limits   Thought Process: within normal limits   Expressive Language: within normal  limits   Receptive Language: within normal limits   Motor:  No cognitive or motor perseveration per se, but she has these occasional jerking movements that throw her back in the chair.  These last about 1 second, and occurred 8 times in our 40 minute visit.  ETOH: Denied  Tobacco: Denied  Illicit: Denied  SI/HI: Denied  Psychosis: Denied  Insight: Within normal limits  Judgment: Within normal limits  Other Psych: As above      Past Medical History   Diagnosis Date   ??? HTN (hypertension) 03/29/2008   ??? Hypercholesterolemia 03/29/2008   ??? GERD (gastroesophageal reflux disease) 03/29/2008   ??? Thyroid goiter 03/29/2008   ??? Osteoporosis 03/29/2008   ??? Arrhythmia    ??? Murmur    ??? Other chest pain    ??? Reflux esophagitis    ??? Anxiety    ??? Essential hypertension        Past Surgical History   Procedure Laterality Date   ??? Hx cholecystectomy     ??? Hx tubal ligation     ??? Pr colonoscopy,diagnostic  02/18/2006     Dr Park Breed       Allergies   Allergen Reactions   ??? Biaxin (Clarithromycin) Unknown (comments)     Pt not sure what type of reaction   ??? Flagyl (Metronidazole) Unknown (comments)  Pt not sure of what type of reaction       Family History   Problem Relation Age of Onset   ??? Heart Disease Sister    ??? Arthritis-osteo Sister        History   Substance Use Topics   ??? Smoking status: Never Smoker    ??? Smokeless tobacco: Never Used   ??? Alcohol Use: No       Current Outpatient Prescriptions   Medication Sig Dispense Refill   ??? LORazepam (ATIVAN) 1 mg tablet Take one pill each morning and one pill each afternoon between 2 and 5 pm for anxiety  60 Tab  1   ??? mirtazapine (REMERON) 30 mg tablet Take 30 mg by mouth nightly.       ??? cyanocobalamin (VITAMIN B-12) 500 mcg tablet Take 500 mcg by mouth daily.       ??? loratadine (CLARITIN) 10 mg tablet Take 1 Tab by mouth daily as needed for Allergies.  30 Tab  11   ??? metoprolol-XL (TOPROL-XL) 25 mg XL tablet Take 1 Tab by mouth daily.  90 Tab  3   ??? valsartan (DIOVAN) 160 mg tablet Take  160 mg by mouth daily.         ??? atorvastatin (LIPITOR) 40 mg tablet TAKE ONE TABLET BY MOUTH IN THE EVENING  90 Tab  3   ??? aspirin 81 mg Tab Take 81 mg by mouth daily.         No current facility-administered medications for this visit.         Plan:  Obtain authorization for testing from insurance company.  Report to follow once testing, scoring, and interpretation completed.  ? Dementia versus pseudodementia.

## 2011-12-07 MED ORDER — ATORVASTATIN 40 MG TAB
40 mg | ORAL_TABLET | ORAL | Status: DC
Start: 2011-12-07 — End: 2012-11-25

## 2011-12-18 ENCOUNTER — Encounter

## 2011-12-18 NOTE — Telephone Encounter (Signed)
Pt called requesting refill for her anxiety med. Any questions please give pt a call @ [phone]

## 2011-12-19 MED ORDER — LORAZEPAM 1 MG TAB
1 mg | ORAL_TABLET | ORAL | Status: DC
Start: 2011-12-19 — End: 2012-05-06

## 2011-12-19 NOTE — Telephone Encounter (Signed)
Ativan 1mg  called into [facility] pharm per Dr. Swaziland

## 2011-12-24 NOTE — Telephone Encounter (Signed)
Patients friend states patient called her crying and asked her to call her doctor, she says she is crying, very anxious, weak and has no appetite, patients friend ask that someone please call patient back thanks    Patient phone number [phone]

## 2011-12-24 NOTE — Telephone Encounter (Signed)
Pt states she does not have an appt and feeling anxious. Pt states she is taking her medication. Pt would like to see Dr.Jordan. Informed pt will check with Dr. Swaziland regarding an appt

## 2011-12-25 NOTE — Telephone Encounter (Signed)
[name] [name] called regarding recent tests. Please give daughter a call @  [phone]

## 2011-12-25 NOTE — Telephone Encounter (Signed)
Spoke with pt's daughter, [name], and requesting an appt to get results of neuro test that Dr. Esaw Dace had did. Informed pt's daughter would need to contact MD who did testing to get results. Daughter verbalized understanding.    [name] would also like to make pt an appt - states pt's urine has an odor and wants to make sure she does not have a UTI. Will check with Dr. Swaziland and call pt

## 2011-12-25 NOTE — Telephone Encounter (Signed)
appt with me for 12/16 at 3:30 for routine follow up.

## 2011-12-25 NOTE — Progress Notes (Signed)
[facility] NEUROLOGY [place], [facility] CENTRE   [address] [place]   [place], [place] [address]   [phone] Office   [phone] Fax      Neuropsychology    Initial Diagnostic Interview Note (12/05/11) & Results From Neuropsychological Evaluation (12/18/11)      Referral:  Paschal Dopp, MD    Gina Holloway is a 77 y.o. right handed widowed African American female  who was unaccompanied to the initial clinical interview.   She completed the 11th grade, and retired from [employer]  She lives alone.  Please refer to her medical records for details pertaining to her history.  Briefly, the patient reported that she has noticed a progressive decline in short term memory.  Misplaces things.  Forgets the content of conversations.  Mind sometimes goes "completely blank."  Problems have been progressively worsening over the past year or so, but she has noticed some short term memory problems multiple years prior to that, but more significant of late.  She cannot sleep.  Appetite is no good.  She has lost quite a bit of weight.  One bowl of oatmeal will keep her all day long.  She can get a bit dizzy when she first gets up, but no falls.  She has not been driving because she feels very nervous.  Depression and anxiety also feels to her as though they have been worse over the past year.  Saw Psychiatry and attended counseling in the past, but counseling not helpful.  She really wants medication to help with her mood.  Does not do anything to relax.  Used to be involved in the senior citizen center.    No previous neuropsych testing.      Historian: Good  Praxis: No UE apraxia  R/L Orientation: Intact to self and to other  Dress: within normal limits   Weight: within normal limits   Appearance/Hygiene: within normal limits   Gait: slow and cautious   Assistive Devices: Glasses  Mood: within normal limits   Affect: within normal limits   Comprehension: within normal limits   Thought Process: within  normal limits   Expressive Language: within normal limits   Receptive Language: within normal limits   Motor:  No cognitive or motor perseveration per se, but she has these occasional jerking movements that throw her back in the chair.  These last about 1 second, and occurred 8 times in our 40 minute visit.  ETOH: Denied  Tobacco: Denied  Illicit: Denied  SI/HI: Denied  Psychosis: Denied  Insight: Within normal limits  Judgment: Within normal limits  Other Psych: As above      Past Medical History   Diagnosis Date   ??? HTN (hypertension) 03/29/2008   ??? Hypercholesterolemia 03/29/2008   ??? GERD (gastroesophageal reflux disease) 03/29/2008   ??? Thyroid goiter 03/29/2008   ??? Osteoporosis 03/29/2008   ??? Arrhythmia    ??? Murmur    ??? Other chest pain    ??? Reflux esophagitis    ??? Anxiety    ??? Essential hypertension        Past Surgical History   Procedure Laterality Date   ??? Hx cholecystectomy     ??? Hx tubal ligation     ??? Pr colonoscopy,diagnostic  02/18/2006     Dr Park Breed       Allergies   Allergen Reactions   ??? Biaxin (Clarithromycin) Unknown (comments)     Pt not sure what type of reaction   ???  Flagyl (Metronidazole) Unknown (comments)     Pt not sure of what type of reaction       Family History   Problem Relation Age of Onset   ??? Heart Disease Sister    ??? Arthritis-osteo Sister        History   Substance Use Topics   ??? Smoking status: Never Smoker    ??? Smokeless tobacco: Never Used   ??? Alcohol Use: No       Current Outpatient Prescriptions   Medication Sig Dispense Refill   ??? LORazepam (ATIVAN) 1 mg tablet Take one pill each morning and one pill each afternoon between 2 and 5 pm for anxiety  60 Tab  1   ??? atorvastatin (LIPITOR) 40 mg tablet TAKE ONE TABLET BY MOUTH EVERY DAY IN THE EVENING  90 Tab  3   ??? mirtazapine (REMERON) 30 mg tablet Take 30 mg by mouth nightly.       ??? cyanocobalamin (VITAMIN B-12) 500 mcg tablet Take 500 mcg by mouth daily.       ??? loratadine (CLARITIN) 10 mg tablet Take 1 Tab by mouth daily as needed for  Allergies.  30 Tab  11   ??? metoprolol-XL (TOPROL-XL) 25 mg XL tablet Take 1 Tab by mouth daily.  90 Tab  3   ??? valsartan (DIOVAN) 160 mg tablet Take 160 mg by mouth daily.         ??? aspirin 81 mg Tab Take 81 mg by mouth daily.         No current facility-administered medications for this visit.         Plan:  Obtain authorization for testing from insurance company.  Report to follow once testing, scoring, and interpretation completed.  ? Dementia versus pseudodementia.      12/18/11  Neuropsychological Test Results    The following tests were administered: Neuropsychological Mental Status Exam, Revised Memory & Behavior Checklist,  Mini Mental Status Exam, Clock Drawing Test, Connors??? Continuous Performance Test ??? II, Verbal Fluency Tests, [employer] ??? Revised, Trailmaking Test Parts A & B, Grooved Pegboard Test, [place] Verbal Learning Test ??? II, Beck Depression Inventory ??? II, Beck Anxiety Inventory, SCL-90, History Taking  & Clinical Interview With The Patient,  Review Of Available Records.    Test Findings:  Test Findings:  Note:  The patient???s raw data have been compared with currently available norms which include demographic corrections for age, gender, and/or education.  Sometimes, the patient???s scores are compared to demographically similar individuals as close to the patient???s age, education level, etc., as possible.  "Average" is viewed as being +/- 1 standard deviation (SD) from the stated mean for a particular test score.  "Low average" is viewed as being between 1 and 2 SD below the mean, and above average is viewed as being 1 and 2 SD above the mean.  Scores falling in the ???borderline??? range (between 1-1/2 and 2 SD below the mean) are viewed with particular attention as to whether they are normal or abnormal neurocognitive test scores.  Other methods of inference in analyzing the test data are also utilized, including the pattern and range of scores in the profile, bilateral motor  functions, and the presence, if any, of pathognomonic signs.        Behaviorally, the patient was friendly and cooperative and appeared motivated to perform well during this examination.  However, she was visibly anxious and had many episodes of jerking back and throwing herself  backwards during the testing appointment.  These were clearly psychogenic.  She appeared quite emotionally vulnerable, despite constant reassurance and encouragement.  On personality testing, she just started circling falls for everything without actually reading the items.  When this was brought to her attention, she began more violently jerking, so the test was discontinued and she was sent home.  Within this context, the results of this evaluation are viewed as a valid reflection of the patient???s actual neurocognitive and emotional status.       The patient's score of 24/30 on the Mini-Mental Status Exam was impaired.  In this regard, she was not oriented to floor.  Backwards spelling was 4/5 correct. Recall for three words after a brief delay was 1/3 correct.  Writing was impaired.  Visual construction was impaired.  Clock drawing was impaired.        Her structured word list fluency, as assessed by the FAS Test, was within the moderately impaired range with a T score of 28.  Category fluency was within the mildly to moderately impaired range with a T score of 34.  Confrontation naming ability, as assessed by the [facility] Test ??? Revised, was within the mildly to moderately impaired range at 16/60 correct (T = 32).      The patient was administered the Connors??? Continuous Performance Test ??? II and generated an abnormal, Clinical, Confidence Index Score at the 99.9% probability of clinically significant problems with sustained visual attention/concentration.  Review of the subscales within this instrument revealed numerous concerns for impulsivity and inattentiveness.  This pattern of performance is indicative of a patient who is at  increased risk for day-to-day problems with sustained visual attention/concentration.     The patient is showing problems with working memory capacity (2nd %ile) and processing speed (2nd %ile) on selected subtests of the WAIS-IV.       The patient was administered the [place] Verbal Learning Test  - II and generated an impaired range (and positive) learning curve over five repeated auditory word list learning trials.  An interference trial was impaired.  Free and cued, short and long delayed recall were all impaired.  Recognition and forced choice recall were low.  This pattern of performance is indicative of a patient who is at increased risk for day-to-day problems with auditory learning and/or memory.       Simple timed visual motor sequencing (Trailmaking Test Part A) was within the moderately impaired range with a T score of 29.  His performance on a similar, but more complex task of timed visual motor sequencing (Trailmaking Test Part B) was within the mildly to moderately impaired range with a T score of 31.   This latter test was discontinued.  This pattern of performance is indicative of a patient who is at increased risk for day-to-day problems with executive functioning.     Fine motor dexterity was within the mildly to moderately impaired range bilaterally.  This does not raise concern for a particularly lateralized brain dysfunction.       She did not report any current pain on a pain questionnaire.       Her Beck Depression Inventory ???II score of 28 was within the severely depressed range.  Her Beck Anxiety Inventory score of 35 reflected severe anxiety.  The patient was clearly not reading items on the Personality Assessment Inventory prior to responding and this occurred despite encouragement and only caused her to have increased jerking reactions, so this test was discontinued.  Impressions & Recommendations:  This is an abnormal range Neuropsychological Evaluation with respect to  neurocognitive functioning.  In this regard, she is showing impairments with mental status, verbal fluency, visual attention, auditory learning, auditory memory, processing speed, working memory, bilateral motor skills, and executive functioning. Emotionally, there is concern for severe depression and anxiety.       While anxiety and depression issues are clearly the most problematic concerns with respect to her functioning, I also see support for a much more mild dementia type process.  In other words, the profile is not consistent with pseudodementia.  I suggest a review of her current psychiatric medication management for depression and anxiety as well as consideration for memory management medication and medication for attention. I am very concerned that she lives alone, especially because I do not find her competent to manage her own affairs and believe she requires supervision for those domains pertaining to memory.  This includes medication management supervision and supervision of financial dealings.  She should not be driving.  Hopefully, an improvement in mood will improve cognition to some degree.  Baseline data has now been established which will help clarify the extent to which anxiety has exacerbating underlying dementia problems.        I will discuss these findings with the patient when she follows up with me in the near future.  A follow up Neuropsychological Evaluation is indicated on a prn basis, especially if there are any cognitive and/or emotional changes.      DIAGNOSES:  ICD-9-294.10 Dementia - Mild     ICD-9-311 Depression - Severe     ICD-9-300.00 Anxiety NOS - Severe     The above information is based upon information currently available to me.  If there is any additional information of which I am currently unaware, I would be more than happy to review it upon having it made available to me.  Thank you for the opportunity to see this interesting individual.     Sincerely,       Tarea Skillman A.  Wynonia Hazard, PsyD, EdS    CC:  Paschal Dopp, MD    [phone] x 2 Clinician testing, review of raw data, scoring. review of records, psychometrist supervision, test interpretation, data integration, report writing.  Coordination of care.   [phone] x [phone] Psychometrist test prep, administration, and scoring

## 2011-12-25 NOTE — Telephone Encounter (Signed)
She needs to follow up with psychiatrist.

## 2011-12-25 NOTE — Telephone Encounter (Signed)
Increase fliud intake.  Check for sx of burning, pain or pressure.

## 2011-12-26 NOTE — Telephone Encounter (Signed)
Called pt appt with Dr. [place] 01/21/2012 at 3:30pm. Pt verbalized understanding

## 2011-12-26 NOTE — Telephone Encounter (Signed)
Spoke with pt and informed pt she needs to follow up with psychiatrist. Gina Holloway pt if she was having any problems with urinating or having any burning or pain when urinating, and if there was an odor to her urine. Pt denied having any problems.

## 2011-12-27 NOTE — Telephone Encounter (Signed)
Returned call; left message.

## 2011-12-27 NOTE — Telephone Encounter (Signed)
Message copied by Margarette Asal on Thu Dec 27, 2011 10:55 AM  ------       Message from: REDD, Ivor Messier       Created: Thu Dec 27, 2011 10:46 AM       Regarding: Dr.Jordansayles/Telephone         [name] would like a return call regarding an appointment "possible UTI". Best contact number [phone].  ------

## 2012-03-19 ENCOUNTER — Encounter

## 2012-03-19 NOTE — Telephone Encounter (Signed)
Pt is suffering from high bp and would like appt. None available. She also stated she needed to be seen to get all meds refilled. Pt can be reached at  [phone]

## 2012-03-21 MED ORDER — VALSARTAN 160 MG TAB
160 mg | ORAL_TABLET | Freq: Every day | ORAL | Status: DC
Start: 2012-03-21 — End: 2013-06-03

## 2012-03-21 MED ORDER — METOPROLOL SUCCINATE SR 25 MG 24 HR TAB
25 mg | ORAL_TABLET | Freq: Every day | ORAL | Status: DC
Start: 2012-03-21 — End: 2013-04-26

## 2012-03-21 NOTE — Telephone Encounter (Signed)
Spoke to pt and she needs a refill on Diovan and metoprolol sent to [facility]

## 2012-05-06 LAB — AMB POC URINALYSIS DIP STICK AUTO W/O MICRO
Bilirubin (UA POC): NEGATIVE
Glucose (UA POC): NEGATIVE
Ketones (UA POC): NEGATIVE
Leukocyte esterase (UA POC): NEGATIVE
Nitrites (UA POC): NEGATIVE
Protein (UA POC): NEGATIVE mg/dL
Specific gravity (UA POC): 1.015 (ref 1.001–1.035)
pH (UA POC): 6.5 (ref 4.6–8.0)

## 2012-05-06 NOTE — Progress Notes (Signed)
Pt here today for complete physical.    Last CMP: 10/02/2011    Last lipid: 10/01/2012    Last A1C: 10/01/2012    Last mammogram: 02/04/2010    Last colonoscopy 02/18/2006 by Dr. Park Breed- repeat in 5 years    Pneumococcal completed

## 2012-05-06 NOTE — Progress Notes (Signed)
Patient had not been taking Remeron and Buspar d/t side effects.  Called Dr Franchot Erichsen for Psych follow-up office at [phone] obtained appointment for April 7th at 4pm. Nurse Navigator Shanda Bumps contacted to help patient make transportation arrangements to next appointment.

## 2012-05-06 NOTE — Progress Notes (Signed)
HISTORY OF PRESENT ILLNESS  Gina Holloway is a 78 y.o. female.  HPI  For CPE.    HTN follow up:  Compliant w/ meds, low salt diet, and exercise.  No home bp monitoring.  No swelling, headache or dizziness.  No SOB, palpitations.  Pt c/o intermittent chest pain but thinks that stress and her "nerves" brings on the pain.  No radiation of pain.  No diaphoresis.  Sx usually nonexertional.    Thyroid Review:  Patient is seen for followup of goiter.   Thyroid ROS: denies fatigue, weight changes, heat/cold intolerance, bowel/skin changes or CVS symptoms.  Depression Review:  Patient is seen for followup of depression.  Pt stopped the Buspar because it was making her legs hurt.  Continues with seeing psychiatrist every 2 weeks for depression and anxiety.  Only taking Remeron now for the insomnia which is not helping.  Pt remains very depress and anxious.  No SI or HI.     Ongoing symptoms include depressed mood, insomnia, fatigue, difficulty concentrating and impaired memory.  She denies hopelessness and recurrent thoughts of death.   She experiences the following side effects from the treatment: none.    Patient Active Problem List   Diagnosis Code   ??? HTN (Hypertension) 401.9   ??? GERD (Gastroesophageal Reflux Disease) 530.81   ??? Thyroid Goiter 240.9   ??? Osteoporosis 733.00   ??? Encounter for long-term (current) use of other medications V58.69   ??? Palpitations 785.1   ??? Mixed hyperlipidemia 272.2   ??? Anxiety 300.00   ??? Shortness of breath on exertion 786.05   ??? Depression 311   ??? Dementia in conditions classified elsewhere without behavioral disturbance(294.10) 294.10   ??? Anxiety state, unspecified 300.00   ??? Depressive disorder, not elsewhere classified 311     Current Outpatient Prescriptions   Medication Sig Dispense Refill   ??? busPIRone (BUSPAR) 15 mg tablet Take 15 mg by mouth daily.       ??? metoprolol succinate (TOPROL-XL) 25 mg XL tablet Take 1 Tab by mouth daily.  90 Tab  3   ??? valsartan (DIOVAN) 160 mg tablet  Take 1 Tab by mouth daily.  90 Tab  3   ??? atorvastatin (LIPITOR) 40 mg tablet TAKE ONE TABLET BY MOUTH EVERY DAY IN THE EVENING  90 Tab  3   ??? cyanocobalamin (VITAMIN B-12) 500 mcg tablet Take 500 mcg by mouth daily.       ??? loratadine (CLARITIN) 10 mg tablet Take 1 Tab by mouth daily as needed for Allergies.  30 Tab  11   ??? aspirin 81 mg Tab Take 81 mg by mouth daily.       ??? mirtazapine (REMERON) 45 mg tablet Take 45 mg by mouth nightly.         Allergies   Allergen Reactions   ??? Biaxin (Clarithromycin) Unknown (comments)     Pt not sure what type of reaction   ??? Flagyl (Metronidazole) Unknown (comments)     Pt not sure of what type of reaction     Past Medical History   Diagnosis Date   ??? HTN (hypertension) 03/29/2008   ??? Hypercholesterolemia 03/29/2008   ??? GERD (gastroesophageal reflux disease) 03/29/2008   ??? Thyroid goiter 03/29/2008   ??? Osteoporosis 03/29/2008   ??? Arrhythmia    ??? Murmur    ??? Other chest pain    ??? Reflux esophagitis    ??? Anxiety    ??? Essential hypertension  Past Surgical History   Procedure Laterality Date   ??? Hx cholecystectomy     ??? Hx tubal ligation     ??? Pr colonoscopy,diagnostic  02/18/2006     Dr Park Breed     Family History   Problem Relation Age of Onset   ??? Heart Disease Sister    ??? Arthritis-osteo Sister      History   Substance Use Topics   ??? Smoking status: Never Smoker    ??? Smokeless tobacco: Never Used   ??? Alcohol Use: No      Review of Systems   Constitutional: Negative for malaise/fatigue.   HENT: Negative for congestion.    Eyes: Negative for blurred vision.   Respiratory: Negative for cough and shortness of breath.    Cardiovascular: Positive for chest pain. Negative for palpitations and leg swelling.   Gastrointestinal: Negative for heartburn, abdominal pain and constipation.   Genitourinary: Negative for dysuria, urgency and frequency.   Musculoskeletal: Negative for back pain and joint pain.   Neurological: Negative for dizziness, tingling and headaches.   Endo/Heme/Allergies:  Negative for environmental allergies.   Psychiatric/Behavioral: Positive for depression and memory loss. Negative for suicidal ideas, hallucinations and substance abuse. The patient is nervous/anxious and has insomnia.        Physical Exam   Nursing note and vitals reviewed.  Constitutional: She is oriented to person, place, and time. She appears well-developed and well-nourished.   HENT:   Right Ear: Tympanic membrane and ear canal normal.   Left Ear: Tympanic membrane and ear canal normal.   Nose: No mucosal edema or rhinorrhea.   Mouth/Throat: Oropharynx is clear and moist and mucous membranes are normal.   Neck: Normal range of motion. Neck supple. No thyromegaly present.   Cardiovascular: Normal rate and regular rhythm.    Murmur heard.   Systolic murmur is present with a grade of 3/6   Pulmonary/Chest: Effort normal and breath sounds normal.   Abdominal: Soft. Bowel sounds are normal. There is no tenderness.   Genitourinary: Guaiac negative stool.   Musculoskeletal: Normal range of motion. She exhibits no edema.   Lymphadenopathy:     She has no cervical adenopathy.   Neurological: She is alert and oriented to person, place, and time. She has normal reflexes. She exhibits normal muscle tone. Coordination normal.   Skin: Skin is warm and dry.   Psychiatric: Thought content normal. Her speech is delayed. She is slowed. She exhibits a depressed mood.       ASSESSMENT and PLAN  Gina Holloway was seen today for complete physical.    Diagnoses and associated orders for this visit:    Routine general medical examination at a health care facility  - AMB POC URINALYSIS DIP STICK AUTO W/O MICRO    HTN (hypertension)  Discussed sodium restriction, high k rich diet, maintaining ideal body weight and regular exercise program such as daily walking 30 min perday 4-5 times per week, as physiologic means to achieve blood pressure control.?? Medication compliance advised.     Mixed hyperlipidemia  Stable on Lipitor.     Dementia in  conditions classified elsewhere without behavioral disturbance(294.10)    Anxiety state, unspecified  - mirtazapine (REMERON) 45 mg tablet; Take 45 mg by mouth nightly.  - busPIRone (BUSPAR) 15 mg tablet; Take 15 mg by mouth daily.    Depressive disorder, not elsewhere classified  See note per NP.      Thyroid goiter  - TSH, 3RD GENERATION  -  T4, FREE    Other general symptoms  - VITAMIN B12 & FOLATE    Atypical chest pain  - REFERRAL TO CARDIOLOGY    Heart murmur  - REFERRAL TO CARDIOLOGY    Other screening mammogram  - MAM MAMMO BI SCREENING DIGTL; Future    Encounter for long-term (current) use of other medications  - METABOLIC PANEL, COMPREHENSIVE  - CBC W/O DIFF        Follow-up Disposition:  Return in about 3 months (around 08/05/2012).  reviewed diet, exercise and weight control  cardiovascular risk and specific lipid/LDL goals reviewed  reviewed medications and side effects in detail

## 2012-05-07 LAB — METABOLIC PANEL, COMPREHENSIVE
A-G Ratio: 1.5 (ref 1.1–2.5)
ALT (SGPT): 19 IU/L (ref 0–32)
AST (SGOT): 25 IU/L (ref 0–40)
Albumin: 3.9 g/dL (ref 3.5–4.8)
Alk. phosphatase: 89 IU/L (ref 39–117)
BUN/Creatinine ratio: 18 (ref 11–26)
BUN: 19 mg/dL (ref 8–27)
Bilirubin, total: 0.2 mg/dL (ref 0.0–1.2)
CO2: 27 mmol/L (ref 19–28)
Calcium: 9.3 mg/dL (ref 8.6–10.2)
Chloride: 104 mmol/L (ref 97–108)
Creatinine: 1.05 mg/dL — ABNORMAL HIGH (ref 0.57–1.00)
GFR est AA: 60 mL/min/{1.73_m2} (ref >59)
GFR est non-AA: 52 mL/min/{1.73_m2} — ABNORMAL LOW (ref >59)
GLOBULIN, TOTAL: 2.6 g/dL (ref 1.5–4.5)
Glucose: 102 mg/dL — ABNORMAL HIGH (ref 65–99)
Potassium: 4.3 mmol/L (ref 3.5–5.2)
Protein, total: 6.5 g/dL (ref 6.0–8.5)
Sodium: 144 mmol/L (ref 134–144)

## 2012-05-07 LAB — CBC W/O DIFF
HCT: 39 % (ref 34.0–46.6)
HGB: 13 g/dL (ref 11.1–15.9)
MCH: 28.8 pg (ref 26.6–33.0)
MCHC: 33.3 g/dL (ref 31.5–35.7)
MCV: 86 fL (ref 79–97)
PLATELET: 229 10*3/uL (ref 155–379)
RBC: 4.52 x10E6/uL (ref 3.77–5.28)
RDW: 12.8 % (ref 12.3–15.4)
WBC: 4.4 10*3/uL (ref 3.4–10.8)

## 2012-05-07 LAB — VITAMIN B12 & FOLATE
Folate: 18 ng/mL (ref >3.0)
Vitamin B12: 771 pg/mL (ref 211–946)

## 2012-05-07 LAB — T4, FREE: T4, Free: 1.32 ng/dL (ref 0.82–1.77)

## 2012-05-07 LAB — TSH 3RD GENERATION: TSH: 0.843 u[IU]/mL (ref 0.450–4.500)

## 2012-05-07 NOTE — Progress Notes (Signed)
Referred by np jennifer for help with assistance.  Met with pt in office.  Pt has appt with dr Clarise Cruz at [facility] next wk and needs transportation.  Pt not on busline. Pt said she can try to ask grandson's friend for ride, this is who gave her a ride to appt today.       4/2 called pt and lm regarding if she knew whether or not she was going to have transportation.  Will await callback.    Called pt again to verify transportation, left another message, will fu.

## 2012-05-07 NOTE — Addendum Note (Signed)
Addended by: Nehemiah Massed on: 05/07/2012 10:46 AM     Modules accepted: Level of Service

## 2012-05-09 NOTE — Progress Notes (Signed)
Set up transportation through [employer] for pt for mon 4/7.  Called pt and made aware that will be picking up at 1515 and ride to and from [place] [facility] at [facility].  Will fu if needed.

## 2012-05-09 NOTE — Progress Notes (Signed)
Received 3 voicemails from pt saying to call her.  Called pt back and she said she was still unable to get a ride to her appt at [facility].  NP jennifer informed me that she had talked to patient's grandson's friend in office day of visit and he said he could take her.  Pt told me that he works mon and tues but could take her Friday.  Will fu and try to get cab voucher approved for pt by supervisor.

## 2012-07-07 NOTE — Telephone Encounter (Signed)
Pt called stating she has a rush on her left arm and pain on her back. Pt is requesting to be seen. Please give pt a call @ [phone]

## 2012-07-09 ENCOUNTER — Encounter

## 2012-07-09 LAB — AMB POC URINALYSIS DIP STICK AUTO W/ MICRO
Bilirubin (UA POC): NEGATIVE
Blood (UA POC): NEGATIVE
Glucose (UA POC): NEGATIVE
Nitrites (UA POC): NEGATIVE
Protein (UA POC): NEGATIVE mg/dL
Specific gravity (UA POC): 1.025 (ref 1.001–1.035)
pH (UA POC): 5.5 (ref 4.6–8.0)

## 2012-07-09 MED ORDER — TRIAMCINOLONE ACETONIDE 0.1 % TOPICAL CREAM
0.1 % | Freq: Two times a day (BID) | CUTANEOUS | Status: DC | PRN
Start: 2012-07-09 — End: 2013-06-03

## 2012-07-09 MED ORDER — BUSPIRONE 15 MG TAB
15 mg | ORAL_TABLET | Freq: Two times a day (BID) | ORAL | Status: DC
Start: 2012-07-09 — End: 2012-10-13

## 2012-07-09 NOTE — Progress Notes (Signed)
Pt is seen with the NP and I agree with treatment plan as outlined.      Katelynne Revak Jordan-Sayles, MD

## 2012-07-09 NOTE — Telephone Encounter (Signed)
Message copied by Teresa Pelton on Wed Jul 09, 2012  3:26 PM  ------       Message from: Festus Aloe       Created: Wed Jul 09, 2012  2:59 PM       Regarding: Dr. Chauncey Cruel         Patient advised that she would like to speak to Dr. Joesph July or her nurse regarding her medication. Best contact number is [phone].   ------

## 2012-07-09 NOTE — Progress Notes (Signed)
Pt c/o rash on right arm x 1 week, which is itchy.    Pt c/o right flank pain x 1 week. Pt reports pain comes and goes. Pt denies any pain at present time.

## 2012-07-09 NOTE — Progress Notes (Signed)
HISTORY OF PRESENT ILLNESS  Gina Holloway is a 78 y.o. female.  HPI  Patient with c/o rash on r arm.  Patient states it is itchy for 1 week.  States she doesn't know if something bit her or not. Denies new foods or change in detergent. Denies pain at rash site.  Patient also has c/o off and on on right side of  lower back pain.  States this has been going for about 2 weeks.  Denies recent or previous injury. Pain is described as dull that comes and goes and last a couple of hours. Pain does not radiate. She hasn't tried anything for the pain.  She denies pain today.   Anxiety Review:  Patient's last visit for anxiety disorder with Dr Franchot Erichsen was 05/12/2012. She had been scheduled for appointments on 4/28 and 5/12 canceled for unknown reasons and now has been rescheduled for  07/28/12. Records have been requested.  Patient continues with increased episodes of anxiety. She has Buspar from Dr Franchot Erichsen and states medication has been adjusted although date of medication coincides with office visit with physician. She states she is taking 1 pill a day though script reads 1/2 tab 3 times a day.  She also has prescribed Remeron for depression.    Ongoing symptoms include: continued anxiety.  Patient denies: palpitations, chest pain, sob, headache.   Reported side effects from the treatment: none.  Patient Active Problem List   Diagnosis Code   ??? HTN (Hypertension) 401.9   ??? GERD (Gastroesophageal Reflux Disease) 530.81   ??? Thyroid Goiter 240.9   ??? Osteoporosis 733.00   ??? Encounter for long-term (current) use of other medications V58.69   ??? Palpitations 785.1   ??? Mixed hyperlipidemia 272.2   ??? Anxiety 300.00   ??? Shortness of breath on exertion 786.05   ??? Depression 311   ??? Dementia in conditions classified elsewhere without behavioral disturbance(294.10) 294.10   ??? Anxiety state, unspecified 300.00   ??? Depressive disorder, not elsewhere classified 311     Current Outpatient Prescriptions   Medication Sig Dispense  Refill   ??? busPIRone (BUSPAR) 15 mg tablet Take 1 Tab by mouth two (2) times a day.       ??? triamcinolone acetonide (KENALOG) 0.1 % topical cream Apply  to affected area two (2) times daily as needed for Skin Irritation.  15 g  0   ??? mirtazapine (REMERON) 45 mg tablet Take 45 mg by mouth nightly.       ??? metoprolol succinate (TOPROL-XL) 25 mg XL tablet Take 1 Tab by mouth daily.  90 Tab  3   ??? valsartan (DIOVAN) 160 mg tablet Take 1 Tab by mouth daily.  90 Tab  3   ??? atorvastatin (LIPITOR) 40 mg tablet TAKE ONE TABLET BY MOUTH EVERY DAY IN THE EVENING  90 Tab  3   ??? aspirin 81 mg Tab Take 81 mg by mouth daily.       ??? cyanocobalamin (VITAMIN B-12) 500 mcg tablet Take 500 mcg by mouth daily.       ??? loratadine (CLARITIN) 10 mg tablet Take 1 Tab by mouth daily as needed for Allergies.  30 Tab  11     Allergies   Allergen Reactions   ??? Biaxin (Clarithromycin) Unknown (comments)     Pt not sure what type of reaction   ??? Flagyl (Metronidazole) Unknown (comments)     Pt not sure of what type of reaction  Past Medical History   Diagnosis Date   ??? HTN (hypertension) 03/29/2008   ??? Hypercholesterolemia 03/29/2008   ??? GERD (gastroesophageal reflux disease) 03/29/2008   ??? Thyroid goiter 03/29/2008   ??? Osteoporosis 03/29/2008   ??? Arrhythmia    ??? Murmur    ??? Other chest pain    ??? Reflux esophagitis    ??? Anxiety    ??? Essential hypertension      Past Surgical History   Procedure Laterality Date   ??? Hx cholecystectomy     ??? Hx tubal ligation     ??? Pr colonoscopy,diagnostic  02/18/2006     Dr Park Breed     Family History   Problem Relation Age of Onset   ??? Heart Disease Sister    ??? Arthritis-osteo Sister      History   Substance Use Topics   ??? Smoking status: Never Smoker    ??? Smokeless tobacco: Never Used   ??? Alcohol Use: No           Review of Systems   Constitutional: Negative for fever, chills and malaise/fatigue.   HENT: Negative for sore throat.    Eyes: Negative for blurred vision, double vision and photophobia.   Respiratory:  Negative for cough, shortness of breath and wheezing.    Cardiovascular: Negative for chest pain, palpitations and leg swelling.   Gastrointestinal: Negative for heartburn, nausea, vomiting, diarrhea and constipation.   Genitourinary: Negative for dysuria, urgency and frequency.   Musculoskeletal: Negative for myalgias, back pain and joint pain.   Skin: Positive for itching and rash.        On right arm.   Neurological: Negative for dizziness, tingling and headaches.   Psychiatric/Behavioral: Positive for depression. The patient is nervous/anxious. The patient does not have insomnia.         Patient states she is seeing Dr Franchot Erichsen for this.       Physical Exam   Nursing note and vitals reviewed.  Constitutional: She is oriented to person, place, and time. She appears well-developed and well-nourished. No distress.   Cardiovascular: Normal rate and regular rhythm.    Murmur heard.  Pulmonary/Chest: Effort normal and breath sounds normal. No respiratory distress.   Abdominal: Soft. There is no tenderness. There is no guarding.   Negative for suprapubic tenderness   Musculoskeletal: Normal range of motion. She exhibits no edema and no tenderness.   Negative for pain over bony prominence bilaterally in hips and lumbar of back.   Full ROM noted in hips and legs. No sharp pain.    Neurological: She is alert and oriented to person, place, and time.   Skin: Skin is warm and dry. Rash noted. No erythema.   Rash is located on r posterior wrist. Nonpapular or without erythema or edema.  It is approx.1/2 inch long. There is no exudate noted.     Scratch mark noted on patients r forearm.   Psychiatric:   Patient appeared with anxiety episodes and some fretting noted.   Followed by Dr Franchot Erichsen for anxiety and depression.       ASSESSMENT and PLAN  Gina Holloway was seen today for rash, lower back pain and continue anxiety.    Diagnoses and associated orders for this visit:    Anxiety state, unspecified  Uncontrolled  Continue  Remeron for depression as prescribed  Increased busPIRone (BUSPAR) 15 mg tablet; Take 1 Tab by mouth two (2) times a day.  Nurse Practitioner to call patient on Wednesday for  evaluate adjustment in Buspirone  Encouraged medication compliance as well as keeping  appointment with Dr Franchot Erichsen [phone] on June 23.     Rash and nonspecific skin eruption  - triamcinolone acetonide (KENALOG) 0.1 % topical cream; Apply  to affected area two (2) times daily as needed for Skin Irritation.  Call office for appointment is rash spreads, becomes red or painful.    Lower back pain  - AMB POC URINALYSIS DIP STICK AUTO W/ MICRO  Take Tylenol for painul episodes    Encounter for long-term (current) use of other medications            Follow-up Disposition:  Return in about 1 month.  reviewed medications and side effects in details    I have discussed diagnosis listed in this note with pt and/or family. I have discussed treatment plans and options and the risk/benefit analysis of those options, including safe use of medications and possible medication side effects.   Through the use of shared decision making we have agreed to the above plan. The patient has received an after-visit summary and questions were answered concerning future plans and follow up.  Advise pt of any urgent changes then to proceed to the ER.      Pt is seen with the NP and I agree with treatment plan as outlined.      Marcy Siren, MD

## 2012-08-11 NOTE — Progress Notes (Signed)
HISTORY OF PRESENT ILLNESS  Gina Holloway is a 78 y.o. female.  HPI  Patient here for anxiety/depression follow-up.   Patient also with c/o leg cramps at night every now for about a couple of months. She states she usually rubs them out or drink water to get relief. She denies edema or erythema decreased movement in legs.  Patient is being followed by Dr Franchot Erichsen for her depression/anxiety conditions.  Pt feels that the medication is too much for her.  She wants to try to decrease the medication but psych has s told her not to decrease.  Pt wants to see if lower dose make her feels better.  She feels "drugged " all day.    Depression Review:  Patient is seen for followup of depression. Treatment includes buspar and Remeron.   Ongoing symptoms include depressed mood, described as situational. She states she gets depressed when visitors leave. She feels lonely.   She denies recurrent thoughts of death, suicidal thoughts without plan, suicidal thoughts with specific plan and suicidal attempt.   She experiences the following side effects from the treatment: none.  Anxiety Review:  Patient is seen for anxiety disorder. Current treatment includes buspar and Remeron.   Ongoing symptoms include: none.   Patient denies: palpitations, sweating, chest pain, shortness of breath.   Reported side effects from the treatment: none.  Patient Active Problem List   Diagnosis Code   ??? HTN (Hypertension) 401.9   ??? GERD (Gastroesophageal Reflux Disease) 530.81   ??? Thyroid Goiter 240.9   ??? Osteoporosis 733.00   ??? Encounter for long-term (current) use of other medications V58.69   ??? Palpitations 785.1   ??? Mixed hyperlipidemia 272.2   ??? Anxiety 300.00   ??? Shortness of breath on exertion 786.05   ??? Depression 311   ??? Dementia in conditions classified elsewhere without behavioral disturbance(294.10) 294.10   ??? Anxiety state, unspecified 300.00   ??? Depressive disorder, not elsewhere classified 311     Current Outpatient Prescriptions    Medication Sig Dispense Refill   ??? triamcinolone acetonide (KENALOG) 0.1 % topical cream Apply  to affected area two (2) times daily as needed for Skin Irritation.  15 g  0   ??? busPIRone (BUSPAR) 15 mg tablet Take 1 Tab by mouth two (2) times a day.  60 Tab  11   ??? mirtazapine (REMERON) 45 mg tablet Take 45 mg by mouth nightly.       ??? metoprolol succinate (TOPROL-XL) 25 mg XL tablet Take 1 Tab by mouth daily.  90 Tab  3   ??? valsartan (DIOVAN) 160 mg tablet Take 1 Tab by mouth daily.  90 Tab  3   ??? atorvastatin (LIPITOR) 40 mg tablet TAKE ONE TABLET BY MOUTH EVERY DAY IN THE EVENING  90 Tab  3   ??? cyanocobalamin (VITAMIN B-12) 500 mcg tablet Take 500 mcg by mouth daily.       ??? loratadine (CLARITIN) 10 mg tablet Take 1 Tab by mouth daily as needed for Allergies.  30 Tab  11   ??? aspirin 81 mg Tab Take 81 mg by mouth daily.         Allergies   Allergen Reactions   ??? Biaxin (Clarithromycin) Unknown (comments)     Pt not sure what type of reaction   ??? Flagyl (Metronidazole) Unknown (comments)     Pt not sure of what type of reaction     Past Medical History   Diagnosis  Date   ??? HTN (hypertension) 03/29/2008   ??? Hypercholesterolemia 03/29/2008   ??? GERD (gastroesophageal reflux disease) 03/29/2008   ??? Thyroid goiter 03/29/2008   ??? Osteoporosis 03/29/2008   ??? Arrhythmia    ??? Murmur    ??? Other chest pain    ??? Reflux esophagitis    ??? Anxiety    ??? Essential hypertension      Past Surgical History   Procedure Laterality Date   ??? Hx cholecystectomy     ??? Hx tubal ligation     ??? Pr colonoscopy,diagnostic  02/18/2006     Dr Park Breed     Family History   Problem Relation Age of Onset   ??? Heart Disease Sister    ??? Arthritis-osteo Sister      History   Substance Use Topics   ??? Smoking status: Never Smoker    ??? Smokeless tobacco: Never Used   ??? Alcohol Use: No           Review of Systems   Constitutional: Negative for fever, chills and malaise/fatigue.   HENT: Negative for congestion and sore throat.    Eyes: Negative for blurred vision,  double vision and photophobia.   Respiratory: Negative for cough, shortness of breath and wheezing.    Cardiovascular: Negative for chest pain, palpitations and leg swelling.   Gastrointestinal: Positive for heartburn. Negative for nausea, vomiting, diarrhea and constipation.        States she had ice cream.   Genitourinary: Negative for dysuria, urgency and frequency.   Musculoskeletal: Negative for myalgias, back pain and joint pain.   Skin: Negative.    Neurological: Negative for dizziness, tingling, tremors and headaches.   Endo/Heme/Allergies: Positive for environmental allergies.        Patient has claritin for this she states it isn't too bad.   Psychiatric/Behavioral: Positive for depression. The patient is nervous/anxious. The patient does not have insomnia.        Physical Exam   Nursing note and vitals reviewed.  Constitutional: She is oriented to person, place, and time. She appears well-developed and well-nourished. No distress.   HENT:   Head: Normocephalic and atraumatic.   Right Ear: External ear normal.   Left Ear: External ear normal.   Eyes: Conjunctivae are normal. No scleral icterus.   Neck: Normal range of motion. Neck supple.   Cardiovascular: Normal rate and regular rhythm.    Murmur heard.  Murmur 2/6   Pulmonary/Chest: Effort normal and breath sounds normal.   Abdominal: Soft. There is no tenderness. There is no guarding.   Musculoskeletal: Normal range of motion.   Neurological: She is alert and oriented to person, place, and time.   Skin: Skin is warm and dry.   Psychiatric: Her behavior is normal. Judgment and thought content normal.   Mood was upbeat from previous visits.  She was very interactive in review of medications for depression and anxiety.       ASSESSMENT and PLAN  Ermal was seen today for follow-up.    Diagnoses and associated orders for this visit:    Anxiety state, unspecified  Decreased busPIRone (BUSPAR) 15 mg tablet; Take 1/2 tablet three times a day    Depressive  disorder, not elsewhere classified  Improving   Decreased Remeron 15 mg tablet; 1/2 tab three times a day  Pt to call if depression sx worsens on the decreased dose.      GERD (gastroesophageal reflux disease)  intermittent  May take Tums for  relief  Monitor high acidity and spicy foods.     Nocturnal leg cramps  tonic water daily x 3 days then PRN  If condition gets worse contact office for follow-up.    Constipation   Miralax Monday, Wednesday, Friday  Drink plenty of fluids, eat high fiber foods  Encouraged exercise for at least 15 minutes to promote motility    Encounter for long-term (current) use of other medications    Follow-up Disposition:  Return in about 2 months (around 10/12/2012).  reviewed medications and side effects in detail    I have discussed diagnosis listed in this note with pt and/or family. I have discussed treatment plans and options and the risk/benefit analysis of those options, including safe use of medications and possible medication side effects.   Through the use of shared decision making we have agreed to the above plan. The patient has received an after-visit summary and questions were answered concerning future plans and follow up.  Advise pt of any urgent changes then to proceed to the ER.      Pt is seen with the NP and I agree with treatment plan as outlined.      Marcy Siren, MD

## 2012-09-10 NOTE — Telephone Encounter (Signed)
Pt states she has an appt 10/13/2012 but would like to be seen sooner for having a "nervous feeling". Pt states Dr. [place] decreased her Buspar but still has a "nervous feeling". Informed pt Dr. [place] is out of office but will check when she returns 09/15/2012 for an appt sooner. Can check for cancellations. Pt verbalized understanding. Informed pt she may need to call psych MD. Pt states she thinks it is her meds causing the feeling and her psych MD told her at last visit he was not changing any of the meds. Pt would like to see Dr. [place]. Informed pt again- will check with Dr. [place] when she returns 09/15/2012

## 2012-09-10 NOTE — Telephone Encounter (Signed)
Patient states she may be seen before her appointment on 10-13-12 for nervous feeling.     Best number to reach her is [phone]

## 2012-09-11 NOTE — Telephone Encounter (Signed)
Please call pt and have her to resume the previous dose of the buspar.

## 2012-09-12 NOTE — Telephone Encounter (Signed)
Called pt and told her to go back to the previous dose of Buspar. Pt stated she would go back to taking Buspar 15mg -1 tab BID. Informed pt to call back after resuming to original dose and still having the "nervous feeling" to call back. Pt verbalized understanding.

## 2012-10-06 NOTE — Progress Notes (Signed)
Pt is seen with the NP and I agree with treatment plan as outlined.      Isaura Schiller Jordan-Sayles, MD

## 2012-10-13 MED ORDER — BUSPIRONE 15 MG TAB
15 mg | ORAL_TABLET | Freq: Two times a day (BID) | ORAL | Status: DC
Start: 2012-10-13 — End: 2012-11-25

## 2012-10-13 NOTE — Progress Notes (Signed)
HISTORY OF PRESENT ILLNESS  Gina Holloway is a 78 y.o. female.  HPI  Patient here for a two month follow-up on Depression, Anxiety and adjustment to Buspar. Patient states she has been taking the Buspar as prescribed, but still with c/o it not working effectively.  She now reports taking 1/2 of her Remeron for about a month or so.  She is also with new reports of the Remeron has been making her dizzy, SOB and with Palpitations although she has been on this medication for some time. She denies chest pain, fever, chills. She was to follow up with Cardiology in April 2014, but did not make the appointment d/t not having a ride.  She is accompanied by a friend on today's appointment.   Depression Review:  Patient is seen for followup of depression. Treatment includes Remeron.   Ongoing symptoms include depressed mood, insomnia and impaired memory. This has been chronic sx for over the past year.  She has been followed by psychiatry.  She has not wanted to consider counseling.    She denies feelings of worthlessness/guilt and suicidal thoughts without plan.   Anxiety Review:  Patient is seen for anxiety disorder. Current treatment includes buspar and no other therapies.   Ongoing symptoms include: palpitations, shortness of breath, dizziness.   Patient denies: sweating, psychomotor agitation, feelings of losing control.   HTN follow up:  Compliant w/ meds, low salt diet, and exercise.  No home bp monitoring.  No swelling, headache.    Hypercholesterolemia follow up:  Compliant w/ meds, low fat, low cholesterol diet.  Exercising some.  No muscle nor abdominal pain, no skin discoloration.  Patient is not fasting today.      Patient Active Problem List   Diagnosis Code   ??? HTN (Hypertension) 401.9   ??? GERD (Gastroesophageal Reflux Disease) 530.81   ??? Thyroid Goiter 240.9   ??? Osteoporosis 733.00   ??? Encounter for long-term (current) use of other medications V58.69   ??? Palpitations 785.1   ??? Mixed hyperlipidemia 272.2   ???  Anxiety 300.00   ??? Shortness of breath on exertion 786.05   ??? Depression 311   ??? Dementia in conditions classified elsewhere without behavioral disturbance(294.10) 294.10   ??? Anxiety state, unspecified 300.00   ??? Depressive disorder, not elsewhere classified 311     Current Outpatient Prescriptions   Medication Sig Dispense Refill   ??? triamcinolone acetonide (KENALOG) 0.1 % topical cream Apply  to affected area two (2) times daily as needed for Skin Irritation.  15 g  0   ??? busPIRone (BUSPAR) 15 mg tablet Take 1 Tab by mouth two (2) times a day.  60 Tab  11   ??? mirtazapine (REMERON) 45 mg tablet Take 45 mg by mouth nightly.       ??? metoprolol succinate (TOPROL-XL) 25 mg XL tablet Take 1 Tab by mouth daily.  90 Tab  3   ??? valsartan (DIOVAN) 160 mg tablet Take 1 Tab by mouth daily.  90 Tab  3   ??? atorvastatin (LIPITOR) 40 mg tablet TAKE ONE TABLET BY MOUTH EVERY DAY IN THE EVENING  90 Tab  3   ??? aspirin 81 mg Tab Take 81 mg by mouth daily.       ??? cyanocobalamin (VITAMIN B-12) 500 mcg tablet Take 500 mcg by mouth daily.       ??? loratadine (CLARITIN) 10 mg tablet Take 1 Tab by mouth daily as needed for Allergies.  30  Tab  11     Allergies   Allergen Reactions   ??? Biaxin [Clarithromycin] Unknown (comments)     Pt not sure what type of reaction   ??? Flagyl [Metronidazole] Unknown (comments)     Pt not sure of what type of reaction     Past Medical History   Diagnosis Date   ??? HTN (hypertension) 03/29/2008   ??? Hypercholesterolemia 03/29/2008   ??? GERD (gastroesophageal reflux disease) 03/29/2008   ??? Thyroid goiter 03/29/2008   ??? Osteoporosis 03/29/2008   ??? Arrhythmia    ??? Murmur    ??? Other chest pain    ??? Reflux esophagitis    ??? Anxiety    ??? Essential hypertension      Past Surgical History   Procedure Laterality Date   ??? Hx cholecystectomy     ??? Hx tubal ligation     ??? Pr colonoscopy,diagnostic  02/18/2006     Dr Park Breed     Family History   Problem Relation Age of Onset   ??? Heart Disease Sister    ??? Arthritis-osteo Sister       History   Substance Use Topics   ??? Smoking status: Never Smoker    ??? Smokeless tobacco: Never Used   ??? Alcohol Use: No           Review of Systems   Constitutional: Positive for malaise/fatigue. Negative for fever and chills.   HENT: Negative for congestion and sore throat.    Eyes: Negative for blurred vision and double vision.   Respiratory: Positive for shortness of breath. Negative for cough and wheezing.    Cardiovascular: Negative for chest pain, palpitations and leg swelling.   Gastrointestinal: Negative for heartburn, nausea, vomiting, diarrhea and constipation.   Genitourinary: Negative for dysuria.   Skin: Negative.    Neurological: Positive for dizziness. Negative for tingling, tremors, weakness and headaches.   Psychiatric/Behavioral: Positive for depression. Negative for suicidal ideas. The patient is nervous/anxious and has insomnia.        Physical Exam   Nursing note and vitals reviewed.  Constitutional: She is oriented to person, place, and time. She appears well-developed and well-nourished. No distress.   HENT:   Head: Normocephalic and atraumatic.   Right Ear: External ear normal.   Left Ear: External ear normal.   Mouth/Throat: Oropharynx is clear and moist.   Eyes: Conjunctivae and EOM are normal. No scleral icterus.   Neck: Normal range of motion. Neck supple. Thyromegaly present.   Cardiovascular: Normal rate and regular rhythm.    Murmur heard.  2/6   Pulmonary/Chest: Effort normal and breath sounds normal. No respiratory distress.   Abdominal: Soft. There is no tenderness. There is no guarding.   Musculoskeletal: Normal range of motion. She exhibits no edema and no tenderness.   Neurological: She is alert and oriented to person, place, and time.   Skin: Skin is warm and dry.   Psychiatric: Her speech is normal. Judgment and thought content normal. Her affect is blunt. She is slowed. Cognition and memory are normal.       ASSESSMENT and PLAN  Gina Holloway was seen today for follow-up.     Diagnoses and associated orders for this visit:    Diagnoses and associated orders for this visit:    Depression//  Anxiety state, unspecified  - Increase buspirone (BUSPAR) 15 mg tablet; Take 1 tablet by mouth two (2) times a day.  Pt to follow up with psychiatry.  Palpitations  - REFERRAL TO CARDIOLOGY  - AMB POC EKG ROUTINE W/ 12 LEADS, INTER & REP    Shortness of breath on exertion  - REFERRAL TO CARDIOLOGY  - AMB POC EKG ROUTINE W/ 12 LEADS, INTER & REP    HTN (hypertension)  Discussed sodium restriction, high k rich diet, maintaining ideal body weight and regular exercise program such as daily walking 30 min perday 4-5 times per week, as physiologic means to achieve blood pressure control.?? Medication compliance advised.     Mixed hyperlipidemia  Stable on Lipitor.      Dizziness//  Fatigue  Seems related to her anxiety issues.    Reassured.  Discussed relaxation activities to help with the anxiety.     Encounter for long-term (current) use of other medications  - METABOLIC PANEL, BASIC  - CBC W/O DIFF      Follow-up Disposition:  Return in about 1 month (around 11/12/2012).  reviewed diet, exercise and weight control  cardiovascular risk and specific lipid/LDL goals reviewed  reviewed medications and side effects in detail

## 2012-10-13 NOTE — Patient Instructions (Signed)
Anxiety Disorder: After Your Visit  Your Care Instructions  Anxiety is a normal reaction to stress. Difficult situations can cause you to have symptoms such as sweaty palms and a nervous feeling.  In an anxiety disorder, the symptoms are far more severe. Constant worry, muscle tension, trouble sleeping, nausea and diarrhea, and other symptoms can make normal daily activities difficult or impossible. These symptoms may occur for no reason, and they can affect your work, school, or social life. Medicines, counseling, and self-care can all help.  Follow-up care is a key part of your treatment and safety. Be sure to make and go to all appointments, and call your doctor if you are having problems. It's also a good idea to know your test results and keep a list of the medicines you take.  How can you care for yourself at home?  ?? Take medicines exactly as directed. Call your doctor if you think you are having a problem with your medicine.  ?? Go to your counseling sessions and follow-up appointments.  ?? Recognize and accept your anxiety. Then, when you are in a situation that makes you anxious, say to yourself, "This is not an emergency. I feel uncomfortable, but I am not in danger. I can keep going even if I feel anxious."  ?? Be kind to your body:  ?? Relieve tension with exercise or a massage.  ?? Get enough rest.  ?? Avoid alcohol, caffeine, nicotine, and illegal drugs. They can increase your anxiety level and cause sleep problems.  ?? Learn and do relaxation techniques. See below for more about these techniques.  ?? Engage your mind. Get out and do something you enjoy. Go to a funny movie, or take a walk or hike. Plan your day. Having too much or too little to do can make you anxious.  ?? Keep a record of your symptoms. Discuss your fears with a good friend or family member, or join a support group for people with similar problems. Talking to others sometimes relieves stress.  ?? Get involved in social groups, or volunteer  to help others. Being alone sometimes makes things seem worse than they are.  ?? Get at least 30 minutes of exercise on most days of the week to relieve stress. Walking is a good choice. You also may want to do other activities, such as running, swimming, cycling, or playing tennis or team sports.  Relaxation techniques  Do relaxation exercises 10 to 20 minutes a day. You can play soothing, relaxing music while you do them, if you wish.  ?? Tell others in your house that you are going to do your relaxation exercises. Ask them not to disturb you.  ?? Find a comfortable place, away from all distractions and noise.  ?? Lie down on your back, or sit with your back straight.  ?? Focus on your breathing. Make it slow and steady.  ?? Breathe in through your nose. Breathe out through either your nose or mouth.  ?? Breathe deeply, filling up the area between your navel and your rib cage. Breathe so that your belly goes up and down.  ?? Do not hold your breath.  ?? Breathe like this for 5 to 10 minutes. Notice the feeling of calmness throughout your whole body.  As you continue to breathe slowly and deeply, relax by doing the following for another 5 to 10 minutes:  ?? Tighten and relax each muscle group in your body. You can begin at your toes and work   your way up to your head.  ?? Imagine your muscle groups relaxing and becoming heavy.  ?? Empty your mind of all thoughts.  ?? Let yourself relax more and more deeply.  ?? Become aware of the state of calmness that surrounds you.  ?? When your relaxation time is over, you can bring yourself back to alertness by moving your fingers and toes and then your hands and feet and then stretching and moving your entire body. Sometimes people fall asleep during relaxation, but they usually wake up shortly afterward.  ?? Always give yourself time to return to full alertness before you drive a car or do anything that might cause an accident if you are not fully alert. Never play a relaxation tape while  you drive a car.  When should you call for help?  Call 911 anytime you think you may need emergency care. For example, call if:  ?? You feel you cannot stop from hurting yourself or someone else.  Watch closely for changes in your health, and be sure to contact your doctor if:  ?? You have anxiety or fear that affects your life.  ?? You have symptoms of anxiety that are new or different from those you had before.   Where can you learn more?   Go to http://www.healthwise.net/[facility]  Enter P754 in the search box to learn more about "Anxiety Disorder: After Your Visit."   ?? 2006-2014 [employer]. Care instructions adapted under license by [employer] (which disclaims liability or warranty for this information). This care instruction is for use with your licensed healthcare professional. If you have questions about a medical condition or this instruction, always ask your healthcare professional. Healthwise, Incorporated disclaims any warranty or liability for your use of this information.  Content Version: 10.1.311062; Current as of: September 11, 2011

## 2012-10-13 NOTE — Progress Notes (Signed)
Pt here for 2 month follow up.     Pt states that Buspar is not helping.    CMP 05/06/2012    TSH/T4 05/06/2012    Lipid 10/02/2011    A1C: 10/02/2011    Mammogram 01/17/2010    Pneumococcal completed.

## 2012-10-13 NOTE — Progress Notes (Signed)
Pt is seen with the NP and I agree with treatment plan as outlined.      Vanden Fawaz Jordan-Sayles, MD

## 2012-10-14 LAB — METABOLIC PANEL, BASIC
BUN/Creatinine ratio: 20 (ref 11–26)
BUN: 18 mg/dL (ref 8–27)
CO2: 26 mmol/L (ref 18–29)
Calcium: 9.4 mg/dL (ref 8.6–10.2)
Chloride: 100 mmol/L (ref 97–108)
Creatinine: 0.89 mg/dL (ref 0.57–1.00)
GFR est AA: 73 mL/min/{1.73_m2} (ref >59)
GFR est non-AA: 63 mL/min/{1.73_m2} (ref >59)
Glucose: 93 mg/dL (ref 65–99)
Potassium: 4.2 mmol/L (ref 3.5–5.2)
Sodium: 142 mmol/L (ref 134–144)

## 2012-10-14 LAB — CBC W/O DIFF
HCT: 42.8 % (ref 34.0–46.6)
HGB: 13.8 g/dL (ref 11.1–15.9)
MCH: 29.3 pg (ref 26.6–33.0)
MCHC: 32.2 g/dL (ref 31.5–35.7)
MCV: 91 fL (ref 79–97)
PLATELET: 235 10*3/uL (ref 150–379)
RBC: 4.71 x10E6/uL (ref 3.77–5.28)
RDW: 12.9 % (ref 12.3–15.4)
WBC: 5.4 10*3/uL (ref 3.4–10.8)

## 2012-10-14 NOTE — Addendum Note (Signed)
Addended by: Marcy Siren D on: 10/14/2012 07:58 AM     Modules accepted: Orders

## 2012-10-15 LAB — MAGNESIUM: Magnesium: 2.2 mg/dL (ref 1.6–2.6)

## 2012-10-15 NOTE — Progress Notes (Signed)
C/O OCC CHEST PAIN/PRESSURE, SOB WITH WALKING AND SITTING, RANDOM LIGHT HEADEDNESS, AND RAPID HEART BEAT ON AND OFF.

## 2012-10-15 NOTE — Patient Instructions (Signed)
MyChart Activation    Thank you for requesting access to MyChart. Please follow the instructions below to securely access and download your online medical record. MyChart allows you to send messages to your doctor, view your test results, renew your prescriptions, schedule appointments, and more.    How Do I Sign Up?    1. In your internet browser, go to [email or web address]  2. Click on the First Time User? Click Here link in the Sign In box. You will be redirect to the New Member Sign Up page.  3. Enter your MyChart Access Code exactly as it appears below. You will not need to use this code after you???ve completed the sign-up process. If you do not sign up before the expiration date, you must request a new code.    MyChart Access Code: [email or web address]  Expires: 11/09/2012 11:54 AM (This is the date your MyChart access code will expire)    4. Enter the last four digits of your Social Security Number (xxxx) and Date of Birth (mm/dd/yyyy) as indicated and click Submit. You will be taken to the next sign-up page.  5. Create a MyChart ID. This will be your MyChart login ID and cannot be changed, so think of one that is secure and easy to remember.  6. Create a MyChart password. You can change your password at any time.  7. Enter your Password Reset Question and Answer. This can be used at a later time if you forget your password.   8. Enter your e-mail address. You will receive e-mail notification when new information is available in MyChart.  9. Click Sign Up. You can now view and download portions of your medical record.  10. Click the Download Summary menu link to download a portable copy of your medical information.    Additional Information    If you have questions, please visit the Frequently Asked Questions section of the MyChart website at [email or web address] Remember, MyChart is NOT to be used for urgent needs. For medical emergencies, dial 911.

## 2012-10-15 NOTE — Telephone Encounter (Signed)
Pt called requesting a call back to be recommended for a psychologist. Pt can be reached @ [phone]

## 2012-10-15 NOTE — Progress Notes (Signed)
Tomma Rakers NP  Subjective:    Gina Holloway is a 78 y.o. female is here for symptom based appt.  The patient presents with chronic symptoms of DOE, palpitations, elevated HR when feeling anxious,  non exertional chest pain and pressure. Last seen 06/2010 where she had similar symptoms and did not proceed with cardiac testing. In ROS patient states 12/2011 was admitted to [facility] and reports she had several heart test performed. Have looked at [facility] care records and see only a fax from [facility] stating she was admitted for depression.  Was started on medications and has had only 1 or so f/u's and does not want to return. Has been adjusting meds on her own.     Patient Active Problem List    Diagnosis Date Noted   ??? Dementia in conditions classified elsewhere without behavioral disturbance(294.10) 12/25/2011   ??? Anxiety state, unspecified 12/25/2011   ??? Depressive disorder, not elsewhere classified 12/25/2011   ??? Depression 02/27/2011   ??? Shortness of breath on exertion 06/15/2010   ??? Palpitations 05/23/2010   ??? Mixed hyperlipidemia 05/23/2010   ??? Anxiety    ??? Encounter for long-term (current) use of other medications 04/19/2009   ??? HTN (Hypertension) 03/29/2008   ??? GERD (Gastroesophageal Reflux Disease) 03/29/2008   ??? Thyroid Goiter 03/29/2008   ??? Osteoporosis 03/29/2008      Paschal Dopp, MD  Past Medical History   Diagnosis Date   ??? HTN (hypertension) 03/29/2008   ??? Hypercholesterolemia 03/29/2008   ??? GERD (gastroesophageal reflux disease) 03/29/2008   ??? Thyroid goiter 03/29/2008   ??? Osteoporosis 03/29/2008   ??? Arrhythmia    ??? Murmur    ??? Other chest pain    ??? Reflux esophagitis    ??? Anxiety    ??? Essential hypertension       Past Surgical History   Procedure Laterality Date   ??? Hx cholecystectomy     ??? Hx tubal ligation     ??? Pr colonoscopy,diagnostic  02/18/2006     Dr Park Breed     Allergies   Allergen Reactions   ??? Biaxin [Clarithromycin] Unknown (comments)     Pt not sure what type of reaction   ??? Flagyl  [Metronidazole] Unknown (comments)     Pt not sure of what type of reaction      Family History   Problem Relation Age of Onset   ??? Heart Disease Sister    ??? Arthritis-osteo Sister       Current Outpatient Prescriptions   Medication Sig   ??? buspirone (BUSPAR) 15 mg tablet Take 1 tablet by mouth two (2) times a day.   ??? mirtazapine (REMERON) 45 mg tablet Take 45 mg by mouth nightly.   ??? metoprolol succinate (TOPROL-XL) 25 mg XL tablet Take 1 Tab by mouth daily.   ??? valsartan (DIOVAN) 160 mg tablet Take 1 Tab by mouth daily.   ??? atorvastatin (LIPITOR) 40 mg tablet TAKE ONE TABLET BY MOUTH EVERY DAY IN THE EVENING   ??? cyanocobalamin (VITAMIN B-12) 500 mcg tablet Take 500 mcg by mouth daily.   ??? aspirin 81 mg Tab Take 81 mg by mouth daily.   ??? triamcinolone acetonide (KENALOG) 0.1 % topical cream Apply  to affected area two (2) times daily as needed for Skin Irritation.   ??? loratadine (CLARITIN) 10 mg tablet Take 1 Tab by mouth daily as needed for Allergies.     No current facility-administered medications for this visit.  Filed Vitals:    10/15/12 1409 10/15/12 1427   BP: 120/68 122/68   Pulse: 74    Resp: 18    Height: 5\' 5"  (1.651 m)    Weight: 144 lb 4.8 oz (65.454 kg)      History     Social History   ??? Marital Status: DIVORCED     Spouse Name: N/A     Number of Children: N/A   ??? Years of Education: N/A     Occupational History   ??? Not on file.     Social History Main Topics   ??? Smoking status: Never Smoker    ??? Smokeless tobacco: Never Used   ??? Alcohol Use: No   ??? Drug Use: No   ??? Sexually Active: No     Other Topics Concern   ??? Not on file     Social History Narrative    ** Merged History Encounter **            I have reviewed the nurses notes, vitals, problem list, allergy list, medical history, family medical, social history and medications.    Review of Symptoms:    General: Pt denies excessive weight gain or loss. Pt is able to conduct ADL's. Soft spoken  HEENT: Denies blurred vision, headaches,  epistaxis and difficulty swallowing.  Respiratory: Denies shortness of breath, DOE, wheezing or stridor.  Cardiovascular: Denies precordial pain, palpitations, edema or PND  Gastrointestinal: Denies poor appetite, indigestion, abdominal pain or blood in stool  Musculoskeletal: Denies pain or swelling from muscles or joints  Neurologic: Denies tremor, paresthesias, or sensory motor disturbance  Skin: Denies rash, itching or texture change.      Physical Exam: ??    General: Well developed, in no acute distress.  HEENT: No carotid bruits, no JVD, trach is midline.   Heart: ??Normal S1/S2 negative S3 or S4. Regular, 2/6 systolic murmur,npo  gallop or rub.??  Respiratory: Clear bilaterally x 4, no wheezing or rales  Abdomen:?? ??Soft, non-tender, bowel sounds are active.??  Extremities:  No edema, normal cap refill, no cyanosis.  Neuro: A&Ox3, speech clear, gait stable.   Skin: Skin color is normal. No rashes or lesions. Non diaphoretic  Vascular: 2+ pulses symmetric in all extremities      Cardiographics    ECG: Sinus   No results found for this or any previous visit.     Labs:  Lab Results   Component Value Date/Time    Sodium 142 10/13/2012 11:28 AM    Potassium 4.2 10/13/2012 11:28 AM    Chloride 100 10/13/2012 11:28 AM    CO2 26 10/13/2012 11:28 AM    Anion gap 6 12/18/2010 12:20 PM    Glucose 93 10/13/2012 11:28 AM    BUN 18 10/13/2012 11:28 AM    Creatinine 0.89 10/13/2012 11:28 AM    BUN/Creatinine ratio 20 10/13/2012 11:28 AM    GFR est AA 73 10/13/2012 11:28 AM    GFR est non-AA 63 10/13/2012 11:28 AM    Calcium 9.4 10/13/2012 11:28 AM    Bilirubin, total 0.2 05/06/2012  3:23 PM    AST 25 05/06/2012  3:23 PM    Alk. phosphatase 89 05/06/2012  3:23 PM    Protein, total 6.5 05/06/2012  3:23 PM    Albumin 3.9 05/06/2012  3:23 PM    Globulin 3.6 12/18/2010 12:20 PM    A-G Ratio 1.5 05/06/2012  3:23 PM    ALT 19 05/06/2012  3:23 PM  Lab Results   Component Value Date/Time    WBC 5.4 10/13/2012 11:28 AM    HGB 13.8 10/13/2012 11:28 AM    HCT 42.8 10/13/2012  11:28 AM    PLATELET 235 10/13/2012 11:28 AM    MCV 91 10/13/2012 11:28 AM    All Cardiac Markers in the last 24 hours:    No results found for this basename: CPK, CK, CPKMB, CKRMB, CKMMB, CKMB, RCK3, CKMBT, CKMBPC, CKSMB, CKNDX, CKND1, MYO, TROQR, TROPT, TROIQ, TROIP, TROI, TROPIT, TROPT, TRPOIT, ITNL, TNIPOC, BNP, BNPP, PBNP                 Assessment:     Assessment:         ICD-9-CM    1. Palpitations 785.1 2D ECHO COMPLETE ADULT (TTE) W OR WO CONTR     CARDIAC HOLTER MONITOR, 24 HOURS   2. Shortness of breath on exertion 786.05 2D ECHO COMPLETE ADULT (TTE) W OR WO CONTR     CARDIAC HOLTER MONITOR, 24 HOURS   3. HTN (hypertension) 401.9 AMB POC EKG ROUTINE W/ 12 LEADS, INTER & REP     2D ECHO COMPLETE ADULT (TTE) W OR WO CONTR     CARDIAC HOLTER MONITOR, 24 HOURS   4. Mixed hyperlipidemia 272.2 2D ECHO COMPLETE ADULT (TTE) W OR WO CONTR     CARDIAC HOLTER MONITOR, 24 HOURS   5. Depressive disorder with anxiety 311 2D ECHO COMPLETE ADULT (TTE) W OR WO CONTR     CARDIAC HOLTER MONITOR, 24 HOURS   6. Dementia in conditions classified elsewhere without behavioral disturbance(294.10) 294.10    7. GERD (gastroesophageal reflux disease) 530.81    8. Thyroid goiter 240.9    9. Hx of medication noncompliance V15.81        Orders Placed This Encounter   ??? AMB POC EKG ROUTINE W/ 12 LEADS, INTER & REP     Order Specific Question:  Reason for Exam:     Answer:  ROUTINE        Plan:     Patient presents with long standing hx of chronic recurring chest pain, palpitations, DOE, fatigue and lightheaded and depression. She states [facility]  performed several cardiac tests during her admission in 12/2011. Will request records from [facility]. We are approaching nearly one year from last evaluation. Feel an ECHO and holter monitor warranted at this time but patient refuses. Believe patient's symptoms related to depression anxiety. Is not willing to return to [facility] but will see Psychiatry here at [facility] or this area close to where she lives. Patient  will discuss with Dr. Joesph July. Will delay any changes in cardiac meds til Psychiatric issues more finely tuned especially meds which may be potentiating cardiac symptoms. Appears to be overmedicated. Patient has reduced one of her meds herself. Continue Betablocker. F/U 3 months.    Harvel Quale, MD

## 2012-10-16 LAB — HEMOGLOBIN A1C WITH EAG: Hemoglobin A1c: 6 % — ABNORMAL HIGH (ref 4.8–5.6)

## 2012-10-16 LAB — SPECIMEN STATUS REPORT

## 2012-10-16 NOTE — Telephone Encounter (Signed)
Returned patient's call; gave her the name and number of Audree Bane. She is going to set up her own appointment because she has to rely on others for transportation.

## 2012-11-25 MED ORDER — ALPRAZOLAM 0.5 MG TAB
0.5 mg | ORAL_TABLET | ORAL | Status: DC
Start: 2012-11-25 — End: 2013-01-09

## 2012-11-25 NOTE — Progress Notes (Signed)
HISTORY OF PRESENT ILLNESS  Gina Holloway is a 78 y.o. female.  HPI  Patient here for a two month follow-up on Depression and Anxiety. and adjustment to Buspar.  Has same c/o that the Buspar is not working effectively.  Also thinks that the Remeron has been making her dizzy and having SOB.  Pt wants to stop these medications.  She currently is not seeing the psychiatrist and does not want to see him back.      Depression Review:  Patient is seen for followup of depression. Treatment includes Remeron.   Ongoing symptoms include depressed mood, insomnia and impaired memory. This has been chronic sx for over the past years.  She has been followed by psychiatry in the past.  She has not wanted to consider counseling.    She denies feelings of worthlessness/guilt and suicidal thoughts without plan.   Anxiety Review:  Patient is seen for anxiety disorder. Current treatment includes buspar and no other therapies.   Ongoing symptoms include: palpitations, shortness of breath, dizziness.   Patient denies: sweating, psychomotor agitation, feelings of losing control.   HTN follow up:  Compliant w/ meds, low salt diet, and exercise.  No home bp monitoring.  No swelling, headache.  Hypercholesterolemia follow up:  Compliant w/ low fat, low cholesterol diet.  She is not taking the Lipitor.      Patient Active Problem List   Diagnosis Code   ??? HTN (Hypertension) 401.9   ??? GERD (Gastroesophageal Reflux Disease) 530.81   ??? Thyroid Goiter 240.9   ??? Osteoporosis 733.00   ??? Encounter for long-term (current) use of other medications V58.69   ??? Palpitations 785.1   ??? Mixed hyperlipidemia 272.2   ??? Anxiety 300.00   ??? Shortness of breath on exertion 786.05   ??? Depression 311   ??? Dementia in conditions classified elsewhere without behavioral disturbance(294.10) 294.10   ??? Anxiety state, unspecified 300.00   ??? Depressive disorder, not elsewhere classified 311   ??? Hx of medication noncompliance V15.81       Current Outpatient  Prescriptions   Medication Sig Dispense Refill   ??? alprazolam (XANAX) 0.5 mg tablet Take one each morning as soon as you get up and one pill each afternoon at 3 pm.  Take one pill at bedtime.  60 tablet  3   ??? triamcinolone acetonide (KENALOG) 0.1 % topical cream Apply  to affected area two (2) times daily as needed for Skin Irritation.  15 g  0   ??? metoprolol succinate (TOPROL-XL) 25 mg XL tablet Take 1 Tab by mouth daily.  90 Tab  3   ??? valsartan (DIOVAN) 160 mg tablet Take 1 Tab by mouth daily.  90 Tab  3   ??? cyanocobalamin (VITAMIN B-12) 500 mcg tablet Take 500 mcg by mouth daily.       ??? loratadine (CLARITIN) 10 mg tablet Take 1 Tab by mouth daily as needed for Allergies.  30 Tab  11   ??? aspirin 81 mg Tab Take 81 mg by mouth daily.           Allergies   Allergen Reactions   ??? Biaxin [Clarithromycin] Unknown (comments)     Pt not sure what type of reaction   ??? Flagyl [Metronidazole] Unknown (comments)     Pt not sure of what type of reaction       Past Medical History   Diagnosis Date   ??? HTN (hypertension) 03/29/2008   ??? Hypercholesterolemia 03/29/2008   ???  GERD (gastroesophageal reflux disease) 03/29/2008   ??? Thyroid goiter 03/29/2008   ??? Osteoporosis 03/29/2008   ??? Arrhythmia    ??? Murmur    ??? Other chest pain    ??? Reflux esophagitis    ??? Anxiety    ??? Essential hypertension        Past Surgical History   Procedure Laterality Date   ??? Hx cholecystectomy     ??? Hx tubal ligation     ??? Pr colonoscopy,diagnostic  02/18/2006     Dr Park Breed       Family History   Problem Relation Age of Onset   ??? Heart Disease Sister    ??? Arthritis-osteo Sister        History   Substance Use Topics   ??? Smoking status: Never Smoker    ??? Smokeless tobacco: Never Used   ??? Alcohol Use: No        Review of Systems   Constitutional: Negative for malaise/fatigue.   HENT: Negative for congestion.    Eyes: Negative for blurred vision.   Respiratory: Negative for cough and shortness of breath.    Cardiovascular: Negative for chest pain, palpitations  and leg swelling.   Gastrointestinal: Negative for heartburn, abdominal pain and constipation.   Genitourinary: Negative for dysuria, urgency and frequency.   Musculoskeletal: Negative for back pain and joint pain.   Neurological: Positive for dizziness. Negative for tingling, sensory change, speech change, focal weakness and headaches.   Endo/Heme/Allergies: Negative for environmental allergies.   Psychiatric/Behavioral: Positive for depression. Negative for suicidal ideas, hallucinations and substance abuse. The patient is nervous/anxious and has insomnia.        Physical Exam   Nursing note and vitals reviewed.  Constitutional: She appears well-developed and well-nourished.   BP 143/66   Pulse 89   Resp 16   Wt 141 lb (63.957 kg)   BMI 23.46 kg/m2   SpO2 97%   LMP 06/11/1990     HENT:   Right Ear: Tympanic membrane and ear canal normal.   Left Ear: Tympanic membrane and ear canal normal.   Nose: No mucosal edema or rhinorrhea.   Mouth/Throat: Oropharynx is clear and moist and mucous membranes are normal.   Neck: Normal range of motion. Neck supple. No thyromegaly present.   Cardiovascular: Normal rate and regular rhythm.    No murmur heard.  Pulmonary/Chest: Effort normal and breath sounds normal.   Abdominal: Soft. Bowel sounds are normal. There is no tenderness.   Musculoskeletal: Normal range of motion. She exhibits no edema.   Lymphadenopathy:     She has no cervical adenopathy.   Skin: Skin is warm and dry.   Psychiatric: Judgment and thought content normal. Her affect is blunt. Her speech is delayed. She is slowed. Cognition and memory are normal.       ASSESSMENT and PLAN  Gina Holloway was seen today for hypertension and medication management.    Diagnoses and associated orders for this visit:    Mixed anxiety and depressive disorder  - alprazolam (XANAX) 0.5 mg tablet; Take one each morning as soon as you get up and one pill each afternoon at 3 pm.  Take one pill at bedtime.  Will hold Buspar and Remeron.       HTN (hypertension)  Continue with current medications.      Mixed hyperlipidemia  Off of lipitor for now.      Encounter for long-term (current) use of other medications  Follow-up Disposition:  Return in about 6 weeks (around 01/06/2013).  reviewed medications and side effects in detail    I have discussed diagnosis listed in this note with pt and/or family. I have discussed treatment plans and options and the risk/benefit analysis of those options, including safe use of medications and possible medication side effects.   Through the use of shared decision making we have agreed to the above plan. The patient has received an after-visit summary and questions were answered concerning future plans and follow up.  Advise pt of any urgent changes then to proceed to the ER.

## 2012-11-25 NOTE — Progress Notes (Signed)
Pt would like to discuss medications that she feels like could be causing her to feel more nervous (Diovan, Remeron, and Buspirone).

## 2012-12-19 NOTE — Telephone Encounter (Signed)
Nurse with [facility] called to let Dr.Jordan-Sayles know that the patient is currently in [facility] for altered mental status and they feel she should not be prescribed benzodiazepine medications.    Nurse w/ [facility] [phone]

## 2012-12-22 NOTE — Telephone Encounter (Signed)
Nurse with [facility] is requesting to get a nursing order faxed to him ASAP so the patient can receive skilled nursing medication education. Nurse states that patient is very confused about what mediations she is taking, how and when to take them and could benefit from further education.    Nurse w/ [facility] office [phone]  Fax order to [phone]

## 2012-12-23 ENCOUNTER — Encounter

## 2012-12-23 NOTE — Telephone Encounter (Signed)
Order faxed

## 2012-12-24 MED ORDER — ATORVASTATIN 40 MG TAB
40 mg | ORAL_TABLET | ORAL | Status: DC
Start: 2012-12-24 — End: 2013-06-03

## 2012-12-24 NOTE — Progress Notes (Signed)
Pt needing hh, set up with [facility], will fax records and fu as needed.

## 2013-01-09 ENCOUNTER — Encounter

## 2013-01-09 NOTE — Progress Notes (Signed)
Spoke with wade from [facility], pt will be seen this weekend, will fu as needed.

## 2013-01-09 NOTE — Progress Notes (Signed)
Met with pt in office.  Pt needing hh for meds.  Pt in office and unsure of meds.  Pt would like aide if possible and doesn't have family that can help a lot.  Will order Child psychotherapist as well and fu as needed.

## 2013-01-09 NOTE — Progress Notes (Signed)
HISTORY OF PRESENT ILLNESS  Gina Holloway is a 79 y.o. female.  HPI  Recent hospital admission for depression.  Hospital records reviewed.  Thought that she was having a reaction to the xanax and recommended not restarting this medication for the anxiety.  Put back on Buspar but pt has stopped this medication.  Pt felt that it was not helping and wants to not taken anything at this time for the anxiety.  Has no follow up with psych and pt decline going back for follow up.   NN in to assist pt to get social worker and [facility] in to acces pt needs at home and medication reconciliation and she is not actually sure what medications that she is taking.     Patient Active Problem List   Diagnosis Code   ??? HTN (Hypertension) 401.9   ??? GERD (Gastroesophageal Reflux Disease) 530.81   ??? Thyroid Goiter 240.9   ??? Osteoporosis 733.00   ??? Encounter for long-term (current) use of other medications V58.69   ??? Palpitations 785.1   ??? Mixed hyperlipidemia 272.2   ??? Anxiety 300.00   ??? Shortness of breath on exertion 786.05   ??? Depression 311   ??? Dementia in conditions classified elsewhere without behavioral disturbance(294.10) 294.10   ??? Anxiety state, unspecified 300.00   ??? Hx of medication noncompliance V15.81   ??? Mixed anxiety and depressive disorder 300.4       Current Outpatient Prescriptions   Medication Sig Dispense Refill   ??? atorvastatin (LIPITOR) 40 mg tablet TAKE ONE TABLET BY MOUTH EVERY DAY IN THE EVENING  90 tablet  3   ??? triamcinolone acetonide (KENALOG) 0.1 % topical cream Apply  to affected area two (2) times daily as needed for Skin Irritation.  15 g  0   ??? metoprolol succinate (TOPROL-XL) 25 mg XL tablet Take 1 Tab by mouth daily.  90 Tab  3   ??? valsartan (DIOVAN) 160 mg tablet Take 1 Tab by mouth daily.  90 Tab  3   ??? cyanocobalamin (VITAMIN B-12) 500 mcg tablet Take 500 mcg by mouth daily.       ??? loratadine (CLARITIN) 10 mg tablet Take 1 Tab by mouth daily as needed for Allergies.  30 Tab  11   ??? aspirin 81 mg Tab  Take 81 mg by mouth daily.           Allergies   Allergen Reactions   ??? Biaxin [Clarithromycin] Unknown (comments)     Pt not sure what type of reaction   ??? Flagyl [Metronidazole] Unknown (comments)     Pt not sure of what type of reaction       Past Medical History   Diagnosis Date   ??? HTN (hypertension) 03/29/2008   ??? Hypercholesterolemia 03/29/2008   ??? GERD (gastroesophageal reflux disease) 03/29/2008   ??? Thyroid goiter 03/29/2008   ??? Osteoporosis 03/29/2008   ??? Arrhythmia    ??? Murmur    ??? Other chest pain    ??? Reflux esophagitis    ??? Anxiety    ??? Essential hypertension        Past Surgical History   Procedure Laterality Date   ??? Hx cholecystectomy     ??? Hx tubal ligation     ??? Pr colonoscopy,diagnostic  02/18/2006     Dr Park Breed       Family History   Problem Relation Age of Onset   ??? Heart Disease Sister    ???  Arthritis-osteo Sister        History   Substance Use Topics   ??? Smoking status: Never Smoker    ??? Smokeless tobacco: Never Used   ??? Alcohol Use: No        Review of Systems   Constitutional: Negative for malaise/fatigue.   HENT: Negative for congestion.    Eyes: Negative for blurred vision.   Respiratory: Negative for cough and shortness of breath.    Cardiovascular: Negative for chest pain, palpitations and leg swelling.   Gastrointestinal: Negative for heartburn, abdominal pain and constipation.   Genitourinary: Negative for dysuria, urgency and frequency.   Musculoskeletal: Negative for back pain and joint pain.   Neurological: Negative for dizziness, tingling and headaches.   Endo/Heme/Allergies: Negative for environmental allergies.   Psychiatric/Behavioral: Positive for depression. Negative for suicidal ideas, hallucinations and substance abuse. The patient is nervous/anxious. The patient does not have insomnia.        Physical Exam   Nursing note and vitals reviewed.  Constitutional: She appears well-developed and well-nourished.   BP 124/61   Pulse 76   Temp(Src) 97.1 ??F (36.2 ??C) (Oral)   Resp 16   Wt  141 lb (63.957 kg)   BMI 23.46 kg/m2   SpO2 98%   LMP 06/11/1990     HENT:   Right Ear: Tympanic membrane and ear canal normal.   Left Ear: Tympanic membrane and ear canal normal.   Nose: No mucosal edema or rhinorrhea.   Mouth/Throat: Oropharynx is clear and moist and mucous membranes are normal.   Neck: Normal range of motion. Neck supple. No thyromegaly present.   Cardiovascular: Normal rate and regular rhythm.    No murmur heard.  Pulmonary/Chest: Effort normal and breath sounds normal.   Abdominal: Soft. Bowel sounds are normal. There is no tenderness.   Musculoskeletal: Normal range of motion. She exhibits no edema.   Lymphadenopathy:     She has no cervical adenopathy.   Skin: Skin is warm and dry.   Psychiatric: Her affect is blunt. Her speech is delayed. She is slowed. She exhibits a depressed mood.       ASSESSMENT and PLAN  Gina Holloway was seen today for hospital follow up.    Diagnoses and associated orders for this visit:    GAD (generalized anxiety disorder)//  Mixed anxiety and depressive disorder  She will need follow up.  Await acces per Child psychotherapist.    She is not suicidal.  Denies any thoughts or creating a plan for suicide.  Therefore pt appears safe to rerun home and HH to be in on tomorrow.        HTN (hypertension)  Stable    Mixed hyperlipidemia  Check lipids at next OV    GERD (gastroesophageal reflux disease)  Stable     Encounter for long-term (current) use of other medications    Other Orders  - Discontinue: mirtazapine (REMERON) 45 mg tablet; Take 45 mg by mouth nightly.  - Discontinue: busPIRone (BUSPAR) 15 mg tablet; Take 15 mg by mouth two (2) times a day.        Follow-up Disposition:  Return in about 1 month (around 02/09/2013).    I have discussed diagnosis listed in this note with pt and/or family. I have discussed treatment plans and options and the risk/benefit analysis of those options, including safe use of medications and possible medication side effects.   Through the use of  shared decision making we have agreed to the above  plan. The patient has received an after-visit summary and questions were answered concerning future plans and follow up.  Advise pt of any urgent changes then to proceed to the ER.      reviewed medications and side effects in detail

## 2013-04-26 MED ORDER — METOPROLOL SUCCINATE SR 25 MG 24 HR TAB
25 mg | ORAL_TABLET | ORAL | Status: DC
Start: 2013-04-26 — End: 2013-06-03

## 2013-06-01 NOTE — Progress Notes (Signed)
Ambulatory Initial Psychiatric Evaluation     Chief Complaint:   Chief Complaint   Patient presents with   ??? New Patient     Referred by Dr. Alvina FilbertPushkin for dementia, depression and anxiety.         History of Present Illness: Gina Holloway is a 79 y.o. Divorced, AA female who presents with symptoms of depression, anxiety and cognitive decline. Patient was brought by her cousin, who appears very supportive and concerned. She was seen one time by Dr. Syliva OvermanYakov Pushkin and was referred for speciality care here.   Pt reports that no medications has worked for her depression, anxiety and nerves in last 3-4 years. She was initially seen by Dr. Leilani MerlEttigi in July 2012 after couple of ED visits for depression/anxiety. She was started on Celexa and Remeron, which she only took for few days and stopped and did not FU with Dr. Leilani MerlEttigi. She continued some Rx on and off with her PCP. Mostly non compliant. She was [facility] psych in November x 2013 and was d/c after 11 days on Remeron and low dose lorazepam with outpatient FU with Dr. Franchot Erichsenosenblatt. She FU with him couple of times but was consistently non compliant. Her last visit there was in April 2014. Since then she has received psych medications from her PCP, Dr. Marcy Sirenheryl Jordan-Sayles. She was tried on Buspar and Xanax as well. Last month saw Dr. Alvina FilbertPushkin and had had multiple ED visits.   Today reports that she is not sleeping well, eating poorly, has lost weight, no energy and stays anxious all the time. She has significant psychomotor retardation. Responses are slow. No gross psychosis or mania. Denies any SI or HI.     MMSE: 19/30  GDS" 14/15    Past Psychiatric History:     Previous psychiatric care: admits  As per HPI    Previous suicide attempts: none    Previous Hospitalizations: admits  As per HPI    Previous psychiatric medications/ECT/TMS: admits  As per HPI          History of rehab, detox, withdrawal: none    Social History:   Ethnic: African American   Relationship Status: divorced x years. Has 4 daughter. Estranged from her children  Living Situation: Alone   Employment: retired x years. 11 th grade education.   Tobacco/Caffeine/Alchol use: no alcohol use  Illicit Drug Social History:  no history of illicit drug use  Hobbies:  reading  Sexual:  not sexually active  No legal problems.     Family History:   Family History   Problem Relation Age of Onset   ??? Heart Disease Sister    ??? Arthritis-osteo Sister         Past Medical History:   Past Medical History   Diagnosis Date   ??? HTN (hypertension) 03/29/2008   ??? Hypercholesterolemia 03/29/2008   ??? GERD (gastroesophageal reflux disease) 03/29/2008   ??? Thyroid goiter 03/29/2008   ??? Osteoporosis 03/29/2008   ??? Arrhythmia    ??? Murmur    ??? Other chest pain    ??? Reflux esophagitis    ??? Anxiety    ??? Essential hypertension    ??? Depression    ??? Dementia          Allergies:   Allergies   Allergen Reactions   ??? Biaxin [Clarithromycin] Unknown (comments)     Pt not sure what type of reaction   ??? Flagyl [Metronidazole] Unknown (comments)  Pt not sure of what type of reaction        Medication List:   Current Outpatient Prescriptions   Medication Sig Dispense Refill   ??? metoprolol succinate (TOPROL-XL) 25 mg XL tablet TAKE ONE TABLET BY MOUTH DAILY  90 Tab  2   ??? atorvastatin (LIPITOR) 40 mg tablet TAKE ONE TABLET BY MOUTH EVERY DAY IN THE EVENING  90 tablet  3   ??? valsartan (DIOVAN) 160 mg tablet Take 1 Tab by mouth daily.  90 Tab  3   ??? aspirin 81 mg Tab Take 81 mg by mouth daily.       ??? triamcinolone acetonide (KENALOG) 0.1 % topical cream Apply  to affected area two (2) times daily as needed for Skin Irritation.  15 g  0   ??? cyanocobalamin (VITAMIN B-12) 500 mcg tablet Take 500 mcg by mouth daily.       ??? loratadine (CLARITIN) 10 mg tablet Take 1 Tab by mouth daily as needed for Allergies.  30 Tab  11        A comprehensive review of systems was negative except for that written in the HPI.    Psychiatric/Mental Status  Examination:     MENTAL STATUS EXAM:  Sensorium  oriented to time, place and person   Orientation person, place and time/date   Relations cooperative and passive   Eye Contact poor   Appearance:  age appropriate and casually dressed   Motor Behavior:  hypoactive and bradykinesis   Speech:  monotone and soft   Vocabulary average   Thought Process: logical   Thought Content free of delusions and free of hallucinations   Suicidal ideations none   Homicidal ideations none   Mood:  anxious and depressed   Affect:  anxious and sad   Memory recent  poor   Memory remote:  adequate   Concentration:  impaired   Abstraction:  concrete   Insight:  poor   Reliability poor   Judgment:  poor   Diagnoses:   Axis I: Major Depression, Recurrent, Severe, h/o Dementia NOS  Axis II: Personality Disorder NOS  Axis III: Multiple medical problems  Axis IV: Problems with primary support group, Problems related to social environment and Other psychosocial or environmental problems  Axis V:41-50 serious symptoms    Treatment Plan:   1. Medication: Medications will be tarted after she is [facility]  2. Discussed: patient given opportunity to ask questions  3. Psychotherapy: none recommended at this time  4. Medical: continue with PCP  5. Return to Clinic: Follow-up Disposition: Not on File       The risk versus benefits of treatment were discussed and side effects explained. Patient and her cousin agrees  with plan..     Time spent with Patient:  30 to 74 minutes    Adela LankSultan A Katilynn Sinkler, MD  06/01/2013

## 2013-06-02 ENCOUNTER — Inpatient Hospital Stay
Admit: 2013-06-02 | Discharge: 2013-06-10 | Disposition: A | Discharge status: Home / Self Care | Payer: MEDICARE | Attending: Geriatric Psychiatry | Admitting: Geriatric Psychiatry

## 2013-06-02 DIAGNOSIS — F341 Dysthymic disorder: Secondary | ICD-10-CM

## 2013-06-02 MED ORDER — MAGNESIUM HYDROXIDE 400 MG/5 ML ORAL SUSP
400 mg/5 mL | Freq: Every day | ORAL | Status: DC | PRN
Start: 2013-06-02 — End: 2013-06-10
  Administered 2013-06-03: 22:00:00 via ORAL

## 2013-06-02 MED ORDER — BENZTROPINE 1 MG TAB
1 mg | Freq: Two times a day (BID) | ORAL | Status: DC | PRN
Start: 2013-06-02 — End: 2013-06-10

## 2013-06-02 MED ORDER — ACETAMINOPHEN 325 MG TABLET
325 mg | ORAL | Status: DC | PRN
Start: 2013-06-02 — End: 2013-06-10

## 2013-06-02 MED ORDER — ZIPRASIDONE MESYLATE 20 MG IM SOLR
20 mg/mL (final conc.) | Freq: Two times a day (BID) | INTRAMUSCULAR | Status: DC | PRN
Start: 2013-06-02 — End: 2013-06-10

## 2013-06-02 MED ORDER — BENZTROPINE 1 MG/ML IJ SOLN
1 mg/mL | Freq: Two times a day (BID) | INTRAMUSCULAR | Status: DC | PRN
Start: 2013-06-02 — End: 2013-06-10

## 2013-06-02 MED ORDER — LORAZEPAM 2 MG/ML IJ SOLN
2 mg/mL | INTRAMUSCULAR | Status: DC | PRN
Start: 2013-06-02 — End: 2013-06-10

## 2013-06-02 MED ORDER — OLANZAPINE 2.5 MG TAB
2.5 mg | Freq: Four times a day (QID) | ORAL | Status: DC | PRN
Start: 2013-06-02 — End: 2013-06-10

## 2013-06-02 MED ORDER — LORAZEPAM 0.5 MG TAB
0.5 mg | ORAL | Status: DC | PRN
Start: 2013-06-02 — End: 2013-06-10
  Administered 2013-06-08: 16:00:00 via ORAL

## 2013-06-02 MED ORDER — ZOLPIDEM 5 MG TAB
5 mg | Freq: Every evening | ORAL | Status: DC | PRN
Start: 2013-06-02 — End: 2013-06-10
  Administered 2013-06-06: 03:00:00 via ORAL

## 2013-06-02 MED ORDER — IBUPROFEN 400 MG TAB
400 mg | Freq: Three times a day (TID) | ORAL | Status: DC | PRN
Start: 2013-06-02 — End: 2013-06-10

## 2013-06-02 MED ORDER — NICOTINE 21 MG/24 HR DAILY PATCH
21 mg/24 hr | Freq: Every day | TRANSDERMAL | Status: DC | PRN
Start: 2013-06-02 — End: 2013-06-10

## 2013-06-02 NOTE — H&P (Signed)
History and Physical    Subjective:     Gina Holloway is a 79 y.o. with past medical hx significant for HTN, high cholestral, GERD, goiter, osteoporosis, and arrythmia, and dementia. Pt is admitted to the hospital with a diagnosis of depression. She denies any acute life threatening issues.     Past Medical History   Diagnosis Date   ??? HTN (hypertension) 03/29/2008   ??? Hypercholesterolemia 03/29/2008   ??? GERD (gastroesophageal reflux disease) 03/29/2008   ??? Thyroid goiter 03/29/2008   ??? Osteoporosis 03/29/2008   ??? Arrhythmia    ??? Murmur    ??? Other chest pain    ??? Reflux esophagitis    ??? Anxiety    ??? Essential hypertension    ??? Depression    ??? Dementia       Past Surgical History   Procedure Laterality Date   ??? Hx cholecystectomy     ??? Hx tubal ligation     ??? Pr colonoscopy,diagnostic  02/18/2006     Dr Park BreedKahn     Family History   Problem Relation Age of Onset   ??? Heart Disease Sister    ??? Arthritis-osteo Sister       History   Substance Use Topics   ??? Smoking status: Never Smoker    ??? Smokeless tobacco: Never Used   ??? Alcohol Use: No       Prior to Admission medications    Medication Sig Start Date End Date Taking? Authorizing Provider   metoprolol succinate (TOPROL-XL) 25 mg XL tablet TAKE ONE TABLET BY MOUTH DAILY 04/26/13   Paschal Doppheryl D Jordan-Sayles, MD   atorvastatin (LIPITOR) 40 mg tablet TAKE ONE TABLET BY MOUTH EVERY DAY IN THE EVENING 12/24/12   Paschal Doppheryl D Jordan-Sayles, MD   triamcinolone acetonide (KENALOG) 0.1 % topical cream Apply  to affected area two (2) times daily as needed for Skin Irritation. 07/09/12   Paschal Doppheryl D Jordan-Sayles, MD   valsartan (DIOVAN) 160 mg tablet Take 1 Tab by mouth daily. 03/21/12   Paschal Doppheryl D Jordan-Sayles, MD   cyanocobalamin (VITAMIN B-12) 500 mcg tablet Take 500 mcg by mouth daily.    Historical Provider   loratadine (CLARITIN) 10 mg tablet Take 1 Tab by mouth daily as needed for Allergies.    Paschal Doppheryl D Jordan-Sayles, MD   aspirin 81 mg Tab Take 81 mg by mouth daily. 04/09/08   Historical  Provider     Allergies   Allergen Reactions   ??? Biaxin [Clarithromycin] Unknown (comments)     Pt not sure what type of reaction   ??? Flagyl [Metronidazole] Unknown (comments)     Pt not sure of what type of reaction        Review of Systems:  Constitutional: negative  Eyes: negative  Ears, Nose, Mouth, Throat, and Face: negative  Respiratory: negative  Cardiovascular: negative  Gastrointestinal: negative  Genitourinary:negative  Integument/Breast: negative  Hematologic/Lymphatic: negative  Musculoskeletal:negative  Neurological: negative  Behavioral/Psychiatric: depression.   Endocrine: negative  Allergic/Immunologic: negative     Objective:     Intake and Output:            Physical Exam:   BP 115/68   Pulse 76   Temp(Src) 97 ??F (36.1 ??C)   Resp 18   SpO2 96%   LMP 06/11/1990   Breastfeeding? No  General:  Alert, cooperative, no distress, appears stated age.   Head:  Normocephalic, without obvious abnormality, atraumatic.   Eyes:  Conjunctivae/corneas clear. PERRL, EOMs  intact.   Ears:  Normal external ear canals both ears.   Nose: Nares normal. Septum midline. Mucosa normal. No drainage or sinus tenderness.   Throat: Lips, mucosa, and tongue normal. Teeth and gums normal.   Neck: Supple, symmetrical, trachea midline, no adenopathy, thyroid: no enlargement/tenderness/nodules, no carotid bruit and no JVD.   Back:   Symmetric, no curvature. ROM normal. No CVA tenderness.   Lungs:   Clear to auscultation bilaterally.   Chest wall:  No tenderness or deformity.   Heart:  Regular rate and rhythm, S1, S2 normal, no murmur, click, rub or gallop.   Abdomen:   Soft, non-tender. Bowel sounds normal. No masses,  No organomegaly.   Extremities: Extremities normal, atraumatic, no cyanosis or edema.   Pulses: 2+ and symmetric all extremities.   Skin: Skin color, texture, turgor normal. No rashes or lesions   Lymph nodes: Cervical, supraclavicular, and axillary nodes normal.   Neurologic: CNII-XII intact. Normal strength,  sensation and reflexes throughout.          Data Review:   No results found for this or any previous visit (from the past 24 hour(s)).    Assessment:     Active Problems:    Depression (02/27/2011)    HTN    GERD    Osteoporosis    Hyperlipidemia    Dementia    arrythmia not specified    Plan:     restart antihypertnesives    Add PPI    Retart Atrovastatin    Add Calcium    No VTE prophylaxis indicated or necessary at this time.   Signed By: Chelsea PrimusSalman M Sharmarke Cicio, MD     June 02, 2013

## 2013-06-02 NOTE — Behavioral Health Treatment Team (Signed)
Patient is cooperative, was seen by Dr. Concha NorwayAkbar. She isolates herself. She has been complaint and follows directions. She interacts minimally and Q2815min checks maintained. She c/o anxiety during the shift.

## 2013-06-02 NOTE — Behavioral Health Treatment Team (Cosign Needed)
GROUP THERAPY PROGRESS NOTE    The patient Gina Holloway is participating in Reflection Group.    Group time: 30 minutes    Personal goal for participation: Personal    Goal orientation: Reflection    Group therapy participation: Active    Therapeutic interventions reviewed and discussed: Yes    Felicity Coyerthel Barnett-Johnson  06/02/2013

## 2013-06-02 NOTE — Behavioral Health Treatment Team (Cosign Needed)
The patient Gina Holloway is participating in Relaxation Group.    Group time: 30 minutes    Personal goal for participation: Personal    Goal orientation: To learn to relax    Group therapy participation: Active    Therapeutic interventions reviewed and discussed: Yes    Felicity Coyerthel Barnett-Johnson  06/02/2013

## 2013-06-02 NOTE — Behavioral Health Treatment Team (Signed)
Patient is admitted under voluntary services.Confused at times. States she has been depressed for 3 year. Cooperative with admission process. Patient has old scars. Dry flaky skin. States she is having A/V hallucinations at times. Search done by Gabriel RungMonique RN and Kennedy BuckerGrant MHT. Staff will continue to monitor patient q6980m safety checks

## 2013-06-03 LAB — URINALYSIS W/MICROSCOPIC
Bacteria: NEGATIVE /hpf
Bilirubin: NEGATIVE
Blood: NEGATIVE
Glucose: NEGATIVE mg/dL
Ketone: NEGATIVE mg/dL
Nitrites: NEGATIVE
Protein: NEGATIVE mg/dL
Specific gravity: 1.01 (ref 1.003–1.030)
Urobilinogen: 1 EU/dL (ref 0.2–1.0)
pH (UA): 7.5 (ref 5.0–8.0)

## 2013-06-03 LAB — LIPID PANEL
CHOL/HDL Ratio: 2.2 (ref 0–5.0)
Cholesterol, total: 152 MG/DL (ref <200)
HDL Cholesterol: 68 MG/DL
LDL, calculated: 57 MG/DL (ref 0–100)
Triglyceride: 135 MG/DL (ref <150)
VLDL, calculated: 27 MG/DL

## 2013-06-03 LAB — DRUG SCREEN, URINE
AMPHETAMINES: NEGATIVE
BARBITURATES: NEGATIVE
BENZODIAZEPINES: NEGATIVE
COCAINE: NEGATIVE
METHADONE: NEGATIVE
OPIATES: NEGATIVE
PCP(PHENCYCLIDINE): NEGATIVE
THC (TH-CANNABINOL): NEGATIVE

## 2013-06-03 LAB — METABOLIC PANEL, COMPREHENSIVE
A-G Ratio: 0.9 — ABNORMAL LOW (ref 1.1–2.2)
ALT (SGPT): 23 U/L (ref 12–78)
AST (SGOT): 26 U/L (ref 15–37)
Albumin: 3.1 g/dL — ABNORMAL LOW (ref 3.5–5.0)
Alk. phosphatase: 66 U/L (ref 45–117)
Anion gap: 8 mmol/L (ref 5–15)
BUN/Creatinine ratio: 22 — ABNORMAL HIGH (ref 12–20)
BUN: 20 MG/DL (ref 6–20)
Bilirubin, total: 0.3 MG/DL (ref 0.2–1.0)
CO2: 31 mmol/L (ref 21–32)
Calcium: 9 MG/DL (ref 8.5–10.1)
Chloride: 103 mmol/L (ref 97–108)
Creatinine: 0.89 MG/DL (ref 0.45–1.15)
GFR est AA: >60 mL/min/{1.73_m2} (ref >60)
GFR est non-AA: >60 mL/min/{1.73_m2} (ref >60)
Globulin: 3.4 g/dL (ref 2.0–4.0)
Glucose: 91 mg/dL (ref 65–100)
Potassium: 4.4 mmol/L (ref 3.5–5.1)
Protein, total: 6.5 g/dL (ref 6.4–8.2)
Sodium: 142 mmol/L (ref 136–145)

## 2013-06-03 LAB — CBC W/O DIFF
HCT: 42.8 % (ref 35.0–47.0)
HGB: 13.8 g/dL (ref 11.5–16.0)
MCH: 29.1 PG (ref 26.0–34.0)
MCHC: 32.2 g/dL (ref 30.0–36.5)
MCV: 90.1 FL (ref 80.0–99.0)
PLATELET: 256 10*3/uL (ref 150–400)
RBC: 4.75 M/uL (ref 3.80–5.20)
RDW: 12.3 % (ref 11.5–14.5)
WBC: 4.3 10*3/uL (ref 3.6–11.0)

## 2013-06-03 LAB — TSH 3RD GENERATION: TSH: 0.68 u[IU]/mL (ref 0.36–3.74)

## 2013-06-03 LAB — GLUCOSE, FASTING: Glucose: 91 MG/DL (ref 65–100)

## 2013-06-03 MED ORDER — VALSARTAN 80 MG TAB
80 mg | Freq: Every day | ORAL | Status: DC
Start: 2013-06-03 — End: 2013-06-10
  Administered 2013-06-03 – 2013-06-10 (×8): via ORAL

## 2013-06-03 MED ORDER — ASPIRIN 81 MG TAB
81 mg | Freq: Every day | ORAL | Status: DC
Start: 2013-06-03 — End: 2013-06-03

## 2013-06-03 MED ORDER — ATORVASTATIN 40 MG TAB
40 mg | Freq: Every evening | ORAL | Status: DC
Start: 2013-06-03 — End: 2013-06-10
  Administered 2013-06-04 – 2013-06-10 (×7): via ORAL

## 2013-06-03 MED ORDER — CYANOCOBALAMIN 500 MCG TAB
500 mcg | Freq: Every day | ORAL | Status: DC
Start: 2013-06-03 — End: 2013-06-10
  Administered 2013-06-04 – 2013-06-10 (×7): via ORAL

## 2013-06-03 MED ORDER — PANTOPRAZOLE 40 MG TAB, DELAYED RELEASE
40 mg | Freq: Every day | ORAL | Status: DC
Start: 2013-06-03 — End: 2013-06-10
  Administered 2013-06-03 – 2013-06-10 (×8): via ORAL

## 2013-06-03 MED ORDER — METOPROLOL SUCCINATE SR 50 MG 24 HR TAB
50 mg | Freq: Every day | ORAL | Status: DC
Start: 2013-06-03 — End: 2013-06-10
  Administered 2013-06-03 – 2013-06-10 (×8): via ORAL

## 2013-06-03 MED ORDER — MIRTAZAPINE 15 MG TAB
15 mg | Freq: Every evening | ORAL | Status: DC
Start: 2013-06-03 — End: 2013-06-10
  Administered 2013-06-04 – 2013-06-10 (×7): via ORAL

## 2013-06-03 MED ORDER — CALCIUM CARBONATE 200 MG (500 MG) CHEWABLE TAB
200 mg calcium (500 mg) | Freq: Three times a day (TID) | ORAL | Status: DC
Start: 2013-06-03 — End: 2013-06-10
  Administered 2013-06-03 – 2013-06-10 (×22): via ORAL

## 2013-06-03 MED ORDER — ASPIRIN 81 MG CHEWABLE TAB
81 mg | Freq: Every day | ORAL | Status: DC
Start: 2013-06-03 — End: 2013-06-10
  Administered 2013-06-03 – 2013-06-10 (×8): via ORAL

## 2013-06-03 MED ORDER — METOPROLOL SUCCINATE SR 50 MG 24 HR TAB
50 mg | Freq: Every day | ORAL | Status: DC
Start: 2013-06-03 — End: 2013-06-03

## 2013-06-03 MED ORDER — LORATADINE 10 MG TAB
10 mg | Freq: Every day | ORAL | Status: DC | PRN
Start: 2013-06-03 — End: 2013-06-10

## 2013-06-03 MED ORDER — ATORVASTATIN 40 MG TAB
40 mg | Freq: Every day | ORAL | Status: DC
Start: 2013-06-03 — End: 2013-06-03

## 2013-06-03 MED ORDER — CLONAZEPAM 0.5 MG TAB
0.5 mg | Freq: Three times a day (TID) | ORAL | Status: DC
Start: 2013-06-03 — End: 2013-06-06
  Administered 2013-06-03 – 2013-06-06 (×9): via ORAL

## 2013-06-03 MED FILL — PROTONIX 40 MG TABLET,DELAYED RELEASE: 40 mg | ORAL | Qty: 1

## 2013-06-03 MED FILL — CLONAZEPAM 0.5 MG TAB: 0.5 mg | ORAL | Qty: 1

## 2013-06-03 MED FILL — CALCIUM CARBONATE 200 MG (500 MG) CHEWABLE TAB: 200 mg calcium (500 mg) | ORAL | Qty: 1

## 2013-06-03 MED FILL — TOPROL XL 50 MG TABLET,EXTENDED RELEASE: 50 mg | ORAL | Qty: 1

## 2013-06-03 MED FILL — VALSARTAN 80 MG TAB: 80 mg | ORAL | Qty: 2

## 2013-06-03 MED FILL — MILK OF MAGNESIA 400 MG/5 ML ORAL SUSPENSION: 400 mg/5 mL | ORAL | Qty: 30

## 2013-06-03 MED FILL — CHILDREN'S ASPIRIN 81 MG CHEWABLE TABLET: 81 mg | ORAL | Qty: 1

## 2013-06-03 NOTE — Behavioral Health Treatment Team (Cosign Needed)
GROUP THERAPY PROGRESS NOTE    The patient Gina Holloway a 79 y.o. female is participating in Coping Skills Group.     Group time: 45 minutes    Personal goal for participation: To identify people and things most grateful for    Goal orientation:  personal    Group therapy participation: minimal    Therapeutic interventions reviewed and discussed: worksheet    Impression of participation:  The patient was present-arrived late    Audelia HivesBeverly S Baker  06/03/2013  1:17 PM

## 2013-06-03 NOTE — Behavioral Health Treatment Team (Cosign Needed)
Pt did not attend creative expression group.

## 2013-06-03 NOTE — Progress Notes (Signed)
Gina ForestShirley was quiet though attentive  in Spirituality Group on Behavioral Health unit  Chaplain Coletta MemosMartha [facility]  Chaplain Paging Service [phone]

## 2013-06-03 NOTE — Behavioral Health Treatment Team (Signed)
Social Work     Met with pt in team for treatment and discharge planning    79 year old African-American female voluntarily admitted for further monitoring and observation related to depression and anxiety. The pt saw Dr. Stefan Church for the first time on Monday, 06/01/13.  The pt has a history of non-compliance with medications and has seen several doctors over the years.  The pt exhibited psychomotor retardation, depressed/flat affect, anxiety, she complained of a poor appetite and receives Meals on Wheels in her home, where she lives alone.  The pt reported that her 4 daughters have nothing to do with her, but when asked to expand on this, the pt talks to one daughter daily and the others do visit her.  Worker called pt's daughter, Lawernce Keas [phone]), but could not leave a message. Worker will try again in the morning.      Delight Stare, LCSW

## 2013-06-03 NOTE — Behavioral Health Treatment Team (Cosign Needed)
GROUP THERAPY PROGRESS NOTE    Gina Holloway is participating in [employer].     Group time: 30 minutes    Personal goal for participation: daily orientation    Goal orientation: personal    Group therapy participation: minimal    Therapeutic interventions reviewed and discussed: yes    Impression of participation: needed prompting

## 2013-06-03 NOTE — Behavioral Health Treatment Team (Addendum)
Patient is cooperative and complaint. She isolates herself with no interaction with peers. Her UA and UDS was done, they are negative. She tends to walk rigid and appears sad and depressed. She follows directions. Q7915min checks maintained. She attended phone calls.

## 2013-06-03 NOTE — Progress Notes (Signed)
Problem: Depressed Mood (Adult/Pediatric)  Goal: *LTG: Returns to previous level of functioning and participates with after care plan  Outcome: Not Progressing Towards Goal  Patient complained of being dizzy when told to go to groups, but explained that dizzy meant nervous. Patient tends to shake or become "weak" in the presence of staff. When I informed the patient that I was aware of that she was able to bear more weight and wasn't so weak. I explained to patient that I wanted to assist her, but I needed her to help by dealing the best she truly could. Appetite is fair. Patient ate at least 50% of meals.

## 2013-06-03 NOTE — Behavioral Health Treatment Team (Signed)
Patient resting quietly  During the night  Eyes closed and respirations visible during  Fifteen minute checks,voiced no  Complain of pain or discomfort.Patient slept 8 hours.

## 2013-06-03 NOTE — Behavioral Health Treatment Team (Cosign Needed)
GROUP THERAPY PROGRESS NOTE    The patient Gina Holloway is participating in Reflection Group.    Group time: 30 minutes    Personal goal for participation: Personal    Goal orientation: Reflection    Group therapy participation: Active    Therapeutic interventions reviewed and discussed: Yes    Gina Holloway  06/03/2013 GROUP THERAPY PROGRESS NOTE    The patient Gina Holloway is participating in Reflection Group.    Group time: 30 minutes    Personal goal for participation: Personal    Goal orientation: Reflection    Group therapy participation: Active    Therapeutic interventions reviewed and discussed: Yes    Gina Holloway  06/03/2013 GROUP THERAPY PROGRESS NOTE    The patient Gina Holloway is participating in Reflection Group.    Group time: 30 minutes    Personal goal for participation: Personal    Goal orientation: Reflection    Group therapy participation: Active    Therapeutic interventions reviewed and discussed: Yes    Gina Holloway  06/03/2013 GROUP THERAPY PROGRESS NOTE    The patient Gina Holloway is participating in Reflection Group.    Group time: 30 minutes    Personal goal for participation: Personal    Goal orientation: Reflection    Group therapy participation: Active    Therapeutic interventions reviewed and discussed: Yes    Gina Holloway  06/03/2013 GROUP THERAPY PROGRESS NOTE    The patient Gina Holloway is participating in Reflection Group.    Group time: 30 minutes    Personal goal for participation: Personal    Goal orientation: Reflection    Group therapy participation: Active    Therapeutic interventions reviewed and discussed: Yes    Gina Holloway  06/03/2013 GROUP THERAPY PROGRESS NOTE    The patient Gina Holloway is participating in Reflection Group.    Group time: 30 minutes    Personal goal for participation: Personal    Goal orientation: Reflection    Group therapy participation: Active    Therapeutic interventions reviewed  and discussed: Yes    Gina Holloway  06/03/2013 GROUP THERAPY PROGRESS NOTE    The patient Gina Holloway is participating in Reflection Group.    Group time: 30 minutes    Personal goal for participation: Personal    Goal orientation: Reflection    Group therapy participation: Active    Therapeutic interventions reviewed and discussed: Yes    Gina Holloway  06/03/2013 GROUP THERAPY PROGRESS NOTE    The patient Gina Holloway is participating in Reflection Group.    Group time: 30 minutes    Personal goal for participation: Personal    Goal orientation: Reflection    Group therapy participation: Active    Therapeutic interventions reviewed and discussed: Yes    Gina Holloway  06/03/2013 GROUP THERAPY PROGRESS NOTE    The patient Gina Holloway is participating in Reflection Group.    Group time: 30 minutes    Personal goal for participation: Personal    Goal orientation: Reflection    Group therapy participation: Active    Therapeutic interventions reviewed and discussed: Yes    Gina Holloway  06/03/2013 GROUP THERAPY PROGRESS NOTE    The patient Gina Holloway is participating in Reflection Group.    Group time: 30 minutes    Personal goal for participation: Personal    Goal orientation: Reflection    Group therapy participation: Active    Therapeutic interventions  reviewed and discussed: Yes    Gina Holloway  06/03/2013 GROUP THERAPY PROGRESS NOTE    The patient Gina Holloway is participating in Reflection Group.    Group time: 30 minutes    Personal goal for participation: Personal    Goal orientation: Reflection    Group therapy participation: Active    Therapeutic interventions reviewed and discussed: Yes    Gina Holloway  06/03/2013 GROUP THERAPY PROGRESS NOTE    The patient Gina Holloway is participating in Reflection Group.    Group time: 30 minutes    Personal goal for participation: Personal    Goal orientation: Reflection    Group therapy  participation: Active    Therapeutic interventions reviewed and discussed: Yes    Gina Holloway  06/03/2013 GROUP THERAPY PROGRESS NOTE    The patient Gina Holloway is participating in Reflection Group.    Group time: 30 minutes    Personal goal for participation: Personal    Goal orientation: Reflection    Group therapy participation: Active    Therapeutic interventions reviewed and discussed: Yes    Gina Holloway  06/03/2013 GROUP THERAPY PROGRESS NOTE    The patient Gina Holloway is participating in Reflection Group.    Group time: 30 minutes    Personal goal for participation: Personal    Goal orientation: Reflection    Group therapy participation: Active    Therapeutic interventions reviewed and discussed: Yes    Gina Holloway  06/03/2013 GROUP THERAPY PROGRESS NOTE    The patient Gina Holloway is participating in Reflection Group.    Group time: 30 minutes    Personal goal for participation: Personal    Goal orientation: Reflection    Group therapy participation: Active    Therapeutic interventions reviewed and discussed: Yes    Gina Holloway  06/03/2013 GROUP THERAPY PROGRESS NOTE    The patient Gina Holloway is participating in Reflection Group.    Group time: 30 minutes    Personal goal for participation: Personal    Goal orientation: Reflection    Group therapy participation: Active    Therapeutic interventions reviewed and discussed: Yes    Gina Holloway  06/03/2013 GROUP THERAPY PROGRESS NOTE    The patient Gina Holloway is participating in Reflection Group.    Group time: 30 minutes    Personal goal for participation: Personal    Goal orientation: Reflection    Group therapy participation: Active    Therapeutic interventions reviewed and discussed: Yes    Gina Holloway  06/03/2013 GROUP THERAPY PROGRESS NOTE    The patient Gina Holloway is participating in Reflection Group.    Group time: 30 minutes    Personal goal for participation:  Personal    Goal orientation: Reflection    Group therapy participation: Active    Therapeutic interventions reviewed and discussed: Yes    Gina Holloway  06/03/2013 GROUP THERAPY PROGRESS NOTE    The patient Gina Holloway is participating in Reflection Group.    Group time: 30 minutes    Personal goal for participation: Personal    Goal orientation: Reflection    Group therapy participation: Active    Therapeutic interventions reviewed and discussed: Yes    Gina Holloway  06/03/2013 GROUP THERAPY PROGRESS NOTE    The patient VINIA JEMMOTT is participating in Reflection Group.    Group time: 30 minutes    Personal goal for participation: Personal    Goal orientation: Reflection  Group therapy participation: Active    Therapeutic interventions reviewed and discussed: Yes    Gina Holloway  06/03/2013 GROUP THERAPY PROGRESS NOTE    The patient RENIYA MCCLEES is participating in Reflection Group.    Group time: 30 minutes    Personal goal for participation: Personal    Goal orientation: Reflection    Group therapy participation: Active    Therapeutic interventions reviewed and discussed: Yes    Gina Holloway  06/03/2013 GROUP THERAPY PROGRESS NOTE    The patient BELICIA DIFATTA is participating in Reflection Group.    Group time: 30 minutes    Personal goal for participation: Personal    Goal orientation: Reflection    Group therapy participation: Active    Therapeutic interventions reviewed and discussed: Yes    Gina Holloway  06/03/2013 GROUP THERAPY PROGRESS NOTE    The patient REXINE GOWENS is participating in Reflection Group.    Group time: 30 minutes    Personal goal for participation: Personal    Goal orientation: Reflection    Group therapy participation: Active    Therapeutic interventions reviewed and discussed: Yes    Gina Holloway  06/03/2013 GROUP THERAPY PROGRESS NOTE    The patient KAELEA GATHRIGHT is participating in Reflection Group.     Group time: 30 minutes    Personal goal for participation: Personal    Goal orientation: Reflection    Group therapy participation: Active    Therapeutic interventions reviewed and discussed: Yes    Gina Holloway  06/03/2013 GROUP THERAPY PROGRESS NOTE    The patient DOVEY FATZINGER is participating in Reflection Group.    Group time: 30 minutes    Personal goal for participation: Personal    Goal orientation: Reflection    Group therapy participation: Active    Therapeutic interventions reviewed and discussed: Yes    Gina Holloway  06/03/2013 GROUP THERAPY PROGRESS NOTE    The patient LOVE CHOWNING is participating in Reflection Group.    Group time: 30 minutes    Personal goal for participation: Personal    Goal orientation: Reflection    Group therapy participation: Active    Therapeutic interventions reviewed and discussed: Yes    Gina Holloway  06/03/2013 GROUP THERAPY PROGRESS NOTE    The patient EDEE NIFONG is participating in Reflection Group.    Group time: 30 minutes    Personal goal for participation: Personal    Goal orientation: Reflection    Group therapy participation: Active    Therapeutic interventions reviewed and discussed: Yes    Gina Holloway  06/03/2013 GROUP THERAPY PROGRESS NOTE    The patient ALAJAH WITMAN is participating in Reflection Group.    Group time: 30 minutes    Personal goal for participation: Personal    Goal orientation: Reflection    Group therapy participation: Active    Therapeutic interventions reviewed and discussed: Yes    Gina Holloway  06/03/2013 GROUP THERAPY PROGRESS NOTE    The patient TIMBERLEE ROBLERO is participating in Reflection Group.    Group time: 30 minutes    Personal goal for participation: Personal    Goal orientation: Reflection    Group therapy participation: Active    Therapeutic interventions reviewed and discussed: Yes    Gina Holloway  06/03/2013 GROUP THERAPY PROGRESS NOTE    The patient  SKYLLAR NOTARIANNI is participating in Reflection Group.    Group time: 30 minutes    Personal goal  for participation: Personal    Goal orientation: Reflection    Group therapy participation: Active    Therapeutic interventions reviewed and discussed: Yes    Gina Coyerthel Barnett-Johnson  06/03/2013 GROUP THERAPY PROGRESS NOTE    The patient Devota PaceShirley H Oncale is participating in Reflection Group.    Group time: 30 minutes    Personal goal for participation: Personal    Goal orientation: Reflection    Group therapy participation: Active    Therapeutic interventions reviewed and discussed: Yes    Gina Coyerthel Barnett-Johnson  06/03/2013 GROUP THERAPY PROGRESS NOTE    The patient Devota PaceShirley H Bolender is participating in Reflection Group.    Group time: 30 minutes    Personal goal for participation: Personal    Goal orientation: Reflection    Group therapy participation: Active    Therapeutic interventions reviewed and discussed: Yes    Gina Coyerthel Barnett-Johnson  06/03/2013 GROUP THERAPY PROGRESS NOTE    The patient Devota PaceShirley H Vigorito is participating in Reflection Group.    Group time: 30 minutes    Personal goal for participation: Personal    Goal orientation: Reflection    Group therapy participation: Active    Therapeutic interventions reviewed and discussed: Yes    Gina Coyerthel Barnett-Johnson  06/03/2013 GROUP THERAPY PROGRESS NOTE    The patient Devota PaceShirley H Monnier is participating in Reflection Group.    Group time: 30 minutes    Personal goal for participation: Personal    Goal orientation: Reflection    Group therapy participation: Active    Therapeutic interventions reviewed and discussed: Yes    Gina Coyerthel Barnett-Johnson  06/03/2013 GROUP THERAPY PROGRESS NOTE    The patient Devota PaceShirley H Gehret is participating in Reflection Group.    Group time: 30 minutes    Personal goal for participation: Personal    Goal orientation: Reflection    Group therapy participation: Active    Therapeutic interventions reviewed and discussed: Yes    Gina Coyerthel Barnett-Johnson   06/03/2013 GROUP THERAPY PROGRESS NOTE    The patient Devota PaceShirley H Wieseler is participating in Reflection Group.    Group time: 30 minutes    Personal goal for participation: Personal    Goal orientation: Reflection    Group therapy participation: Active    Therapeutic interventions reviewed and discussed: Yes    Gina Coyerthel Barnett-Johnson  06/03/2013 GROUP THERAPY PROGRESS NOTE    The patient Devota PaceShirley H Wilhite is participating in Reflection Group.    Group time: 30 minutes    Personal goal for participation: Personal    Goal orientation: Reflection    Group therapy participation: Active    Therapeutic interventions reviewed and discussed: Yes    Gina Coyerthel Barnett-Johnson  06/03/2013 GROUP THERAPY PROGRESS NOTE    The patient Devota PaceShirley H Mol is participating in Reflection Group.    Group time: 30 minutes    Personal goal for participation: Personal    Goal orientation: Reflection    Group therapy participation: Active    Therapeutic interventions reviewed and discussed: Yes    Gina Coyerthel Barnett-Johnson  06/03/2013 GROUP THERAPY PROGRESS NOTE    The patient Devota PaceShirley H Cooksey is participating in Reflection Group.    Group time: 30 minutes    Personal goal for participation: Personal    Goal orientation: Reflection    Group therapy participation: Active    Therapeutic interventions reviewed and discussed: Yes    Gina Coyerthel Barnett-Johnson  06/03/2013 GROUP THERAPY PROGRESS NOTE    The patient Devota PaceShirley H Degan is participating in Reflection Group.  Group time: 30 minutes    Personal goal for participation: Personal    Goal orientation: Reflection    Group therapy participation: Active    Therapeutic interventions reviewed and discussed: Yes    Gina Holloway  06/03/2013 GROUP THERAPY PROGRESS NOTE    The patient SHAUNTEL PREST is participating in Reflection Group.    Group time: 30 minutes    Personal goal for participation: Personal    Goal orientation: Reflection    Group therapy participation: Active    Therapeutic  interventions reviewed and discussed: Yes    Gina Holloway  06/03/2013 GROUP THERAPY PROGRESS NOTE    The patient DANIESHA DRIVER is participating in Reflection Group.    Group time: 30 minutes    Personal goal for participation: Personal    Goal orientation: Reflection    Group therapy participation: Active    Therapeutic interventions reviewed and discussed: Yes    Gina Holloway  06/03/2013 GROUP THERAPY PROGRESS NOTE    The patient SHANAIA SIEVERS is participating in Reflection Group.    Group time: 30 minutes    Personal goal for participation: Personal    Goal orientation: Reflection    Group therapy participation: Active    Therapeutic interventions reviewed and discussed: Yes    Gina Holloway  06/03/2013 GROUP THERAPY PROGRESS NOTE    The patient ESTREYA CLAY is participating in Reflection Group.    Group time: 30 minutes    Personal goal for participation: Personal    Goal orientation: Reflection    Group therapy participation: Active    Therapeutic interventions reviewed and discussed: Yes    Gina Holloway  06/03/2013 GROUP THERAPY PROGRESS NOTE    The patient MYLANI GENTRY is participating in Reflection Group.    Group time: 30 minutes    Personal goal for participation: Personal    Goal orientation: Reflection    Group therapy participation: Active    Therapeutic interventions reviewed and discussed: Yes    Gina Holloway  06/03/2013 GROUP THERAPY PROGRESS NOTE    The patient IDALIE CANTO is participating in Reflection Group.    Group time: 30 minutes    Personal goal for participation: Personal    Goal orientation: Reflection    Group therapy participation: Active    Therapeutic interventions reviewed and discussed: Yes    Gina Holloway  06/03/2013 GROUP THERAPY PROGRESS NOTE    The patient REILYN NELSON is participating in Reflection Group.    Group time: 30 minutes    Personal goal for participation: Personal    Goal orientation: Reflection     Group therapy participation: Active    Therapeutic interventions reviewed and discussed: Yes    Gina Holloway  06/03/2013 GROUP THERAPY PROGRESS NOTE    The patient FEIGE LOWDERMILK is participating in Reflection Group.    Group time: 30 minutes    Personal goal for participation: Personal    Goal orientation: Reflection    Group therapy participation: Active    Therapeutic interventions reviewed and discussed: Yes    Gina Holloway  06/03/2013 GROUP THERAPY PROGRESS NOTE    The patient RONALDA WALPOLE is participating in Reflection Group.    Group time: 30 minutes    Personal goal for participation: Personal    Goal orientation: Reflection    Group therapy participation: Active    Therapeutic interventions reviewed and discussed: Yes    Gina Holloway  06/03/2013 GROUP THERAPY PROGRESS NOTE    The  patient SHAYNE DEERMAN is participating in Reflection Group.    Group time: 30 minutes    Personal goal for participation: Personal    Goal orientation: Reflection    Group therapy participation: Active    Therapeutic interventions reviewed and discussed: Yes    Gina Holloway  06/03/2013 GROUP THERAPY PROGRESS NOTE    The patient KLARA STJAMES is participating in Reflection Group.    Group time: 30 minutes    Personal goal for participation: Personal    Goal orientation: Reflection    Group therapy participation: Active    Therapeutic interventions reviewed and discussed: Yes    Gina Holloway  06/03/2013 GROUP THERAPY PROGRESS NOTE    The patient KHALIYAH NORTHROP is participating in Reflection Group.    Group time: 30 minutes    Personal goal for participation: Personal    Goal orientation: Reflection    Group therapy participation: Active    Therapeutic interventions reviewed and discussed: Yes    Gina Holloway  06/03/2013 GROUP THERAPY PROGRESS NOTE    The patient VANNESA ABAIR is participating in Reflection Group.    Group time: 30 minutes    Personal goal for  participation: Personal    Goal orientation: Reflection    Group therapy participation: Active    Therapeutic interventions reviewed and discussed: Yes    Gina Holloway  06/03/2013 GROUP THERAPY PROGRESS NOTE    The patient HINA GUPTA is participating in Reflection Group.    Group time: 30 minutes    Personal goal for participation: Personal    Goal orientation: Reflection    Group therapy participation: Active    Therapeutic interventions reviewed and discussed: Yes    Gina Holloway  06/03/2013 GROUP THERAPY PROGRESS NOTE    The patient HAIVEN NARDONE is participating in Reflection Group.    Group time: 30 minutes    Personal goal for participation: Personal    Goal orientation: Reflection    Group therapy participation: Active    Therapeutic interventions reviewed and discussed: Yes    Gina Holloway  06/03/2013 GROUP THERAPY PROGRESS NOTE    The patient CANNIE MUCKLE is participating in Reflection Group.    Group time: 30 minutes    Personal goal for participation: Personal    Goal orientation: Reflection    Group therapy participation: Active    Therapeutic interventions reviewed and discussed: Yes    Gina Holloway  06/03/2013 GROUP THERAPY PROGRESS NOTE    The patient JERALYNN VAQUERA is participating in Reflection Group.    Group time: 30 minutes    Personal goal for participation: Personal    Goal orientation: Reflection    Group therapy participation: Active    Therapeutic interventions reviewed and discussed: Yes    Gina Holloway  06/03/2013 GROUP THERAPY PROGRESS NOTE    The patient ALYHA MARINES is participating in Reflection Group.    Group time: 30 minutes    Personal goal for participation: Personal    Goal orientation: Reflection    Group therapy participation: Active    Therapeutic interventions reviewed and discussed: Yes    Gina Holloway  06/03/2013 GROUP THERAPY PROGRESS NOTE    The patient EMILLIA WEATHERLY is participating in Reflection  Group.    Group time: 30 minutes    Personal goal for participation: Personal    Goal orientation: Reflection    Group therapy participation: Active    Therapeutic interventions reviewed and discussed: Yes  Gina Holloway  06/03/2013 GROUP THERAPY PROGRESS NOTE    The patient ZEEVA COURSER is participating in Reflection Group.    Group time: 30 minutes    Personal goal for participation: Personal    Goal orientation: Reflection    Group therapy participation: Active    Therapeutic interventions reviewed and discussed: Yes    Gina Holloway  06/03/2013 GROUP THERAPY PROGRESS NOTE    The patient CEDRICKA SACKRIDER is participating in Reflection Group.    Group time: 30 minutes    Personal goal for participation: Personal    Goal orientation: Reflection    Group therapy participation: Active    Therapeutic interventions reviewed and discussed: Yes    Gina Holloway  06/03/2013 GROUP THERAPY PROGRESS NOTE    The patient LIZANIA BOUCHARD is participating in Reflection Group.    Group time: 30 minutes    Personal goal for participation: Personal    Goal orientation: Reflection    Group therapy participation: Active    Therapeutic interventions reviewed and discussed: Yes    Gina Holloway  06/03/2013 GROUP THERAPY PROGRESS NOTE    The patient JESSLYNN KRUCK is participating in Reflection Group.    Group time: 30 minutes    Personal goal for participation: Personal    Goal orientation: Reflection    Group therapy participation: Active    Therapeutic interventions reviewed and discussed: Yes    Gina Holloway  06/03/2013 GROUP THERAPY PROGRESS NOTE    The patient LANEE CHAIN is participating in Reflection Group.    Group time: 30 minutes    Personal goal for participation: Personal    Goal orientation: Reflection    Group therapy participation: Active    Therapeutic interventions reviewed and discussed: Yes    Gina Holloway  06/03/2013 GROUP THERAPY PROGRESS NOTE    The  patient AMANY RANDO is participating in Reflection Group.    Group time: 30 minutes    Personal goal for participation: Personal    Goal orientation: Reflection    Group therapy participation: Active    Therapeutic interventions reviewed and discussed: Yes    Gina Holloway  06/03/2013 GROUP THERAPY PROGRESS NOTE    The patient ZYLPHA POYNOR is participating in Reflection Group.    Group time: 30 minutes    Personal goal for participation: Personal    Goal orientation: Reflection    Group therapy participation: Active    Therapeutic interventions reviewed and discussed: Yes    Gina Holloway  06/03/2013 GROUP THERAPY PROGRESS NOTE    The patient KATERIN NEGRETE is participating in Reflection Group.    Group time: 30 minutes    Personal goal for participation: Personal    Goal orientation: Reflection    Group therapy participation: Active    Therapeutic interventions reviewed and discussed: Yes    Gina Holloway  06/03/2013 GROUP THERAPY PROGRESS NOTE    The patient SCOTTLYNN LINDELL is participating in Reflection Group.    Group time: 30 minutes    Personal goal for participation: Personal    Goal orientation: Reflection    Group therapy participation: Active    Therapeutic interventions reviewed and discussed: Yes    Gina Holloway  06/03/2013 GROUP THERAPY PROGRESS NOTE    The patient GRACEANN BOILEAU is participating in Reflection Group.    Group time: 30 minutes    Personal goal for participation: Personal    Goal orientation: Reflection    Group therapy participation: Active  Therapeutic interventions reviewed and discussed: Yes    Gina Holloway  06/03/2013 GROUP THERAPY PROGRESS NOTE    The patient ODESTER NILSON is participating in Reflection Group.    Group time: 30 minutes    Personal goal for participation: Personal    Goal orientation: Reflection    Group therapy participation: Active    Therapeutic interventions reviewed and discussed: Yes    Gina Holloway  06/03/2013 GROUP THERAPY PROGRESS NOTE    The patient NELDA LUCKEY is participating in Reflection Group.    Group time: 30 minutes    Personal goal for participation: Personal    Goal orientation: Reflection    Group therapy participation: Active    Therapeutic interventions reviewed and discussed: Yes    Gina Holloway  06/03/2013 GROUP THERAPY PROGRESS NOTE    The patient ANGELLI BARUCH is participating in Reflection Group.    Group time: 30 minutes    Personal goal for participation: Personal    Goal orientation: Reflection    Group therapy participation: Active    Therapeutic interventions reviewed and discussed: Yes    Gina Holloway  06/03/2013 GROUP THERAPY PROGRESS NOTE    The patient PRABHNOOR ELLENBERGER is participating in Reflection Group.    Group time: 30 minutes    Personal goal for participation: Personal    Goal orientation: Reflection    Group therapy participation: Active    Therapeutic interventions reviewed and discussed: Yes    Gina Holloway  06/03/2013 GROUP THERAPY PROGRESS NOTE    The patient GUSTAVA BERLAND is participating in Reflection Group.    Group time: 30 minutes    Personal goal for participation: Personal    Goal orientation: Reflection    Group therapy participation: Active    Therapeutic interventions reviewed and discussed: Yes    Gina Holloway  06/03/2013 GROUP THERAPY PROGRESS NOTE    The patient MALYIAH FELLOWS is participating in Reflection Group.    Group time: 30 minutes    Personal goal for participation: Personal    Goal orientation: Reflection    Group therapy participation: Active    Therapeutic interventions reviewed and discussed: Yes    Gina Holloway  06/03/2013 GROUP THERAPY PROGRESS NOTE    The patient DEYA BIGOS is participating in Reflection Group.    Group time: 30 minutes    Personal goal for participation: Personal    Goal orientation: Reflection    Group therapy participation: Active     Therapeutic interventions reviewed and discussed: Yes    Gina Holloway  06/03/2013 GROUP THERAPY PROGRESS NOTE    The patient JORRYN HERSHBERGER is participating in Reflection Group.    Group time: 30 minutes    Personal goal for participation: Personal    Goal orientation: Reflection    Group therapy participation: Active    Therapeutic interventions reviewed and discussed: Yes    Gina Holloway  06/03/2013 GROUP THERAPY PROGRESS NOTE    The patient VETTA COUZENS is participating in Reflection Group.    Group time: 30 minutes    Personal goal for participation: Personal    Goal orientation: Reflection    Group therapy participation: Active    Therapeutic interventions reviewed and discussed: Yes    Gina Holloway  06/03/2013 GROUP THERAPY PROGRESS NOTE    The patient JAIMA JANNEY is participating in Reflection Group.    Group time: 30 minutes    Personal goal for participation: Personal    Goal  orientation: Reflection    Group therapy participation: Active    Therapeutic interventions reviewed and discussed: Yes    Gina Holloway  06/03/2013 GROUP THERAPY PROGRESS NOTE    The patient MIGNONNE AFONSO is participating in Reflection Group.    Group time: 30 minutes    Personal goal for participation: Personal    Goal orientation: Reflection    Group therapy participation: Active    Therapeutic interventions reviewed and discussed: Yes    Gina Holloway  06/03/2013 GROUP THERAPY PROGRESS NOTE    The patient ALISHBA NAPLES is participating in Reflection Group.    Group time: 30 minutes    Personal goal for participation: Personal    Goal orientation: Reflection    Group therapy participation: Active    Therapeutic interventions reviewed and discussed: Yes    Gina Holloway  06/03/2013 GROUP THERAPY PROGRESS NOTE    The patient SAKINA BRIONES is participating in Reflection Group.    Group time: 30 minutes    Personal goal for participation: Personal    Goal  orientation: Reflection    Group therapy participation: Active    Therapeutic interventions reviewed and discussed: Yes    Gina Holloway  06/03/2013 GROUP THERAPY PROGRESS NOTE    The patient MYLEKA MONCURE is participating in Reflection Group.    Group time: 30 minutes    Personal goal for participation: Personal    Goal orientation: Reflection    Group therapy participation: Active    Therapeutic interventions reviewed and discussed: Yes    Gina Holloway  06/03/2013 GROUP THERAPY PROGRESS NOTE    The patient ANTORIA LANZA is participating in Reflection Group.    Group time: 30 minutes    Personal goal for participation: Personal    Goal orientation: Reflection    Group therapy participation: Active    Therapeutic interventions reviewed and discussed: Yes    Gina Holloway  06/03/2013 GROUP THERAPY PROGRESS NOTE    The patient VALMA ROTENBERG is participating in Reflection Group.    Group time: 30 minutes    Personal goal for participation: Personal    Goal orientation: Reflection    Group therapy participation: Active    Therapeutic interventions reviewed and discussed: Yes    Gina Holloway  06/03/2013 GROUP THERAPY PROGRESS NOTE    The patient MIRIAH MARUYAMA is participating in Reflection Group.    Group time: 30 minutes    Personal goal for participation: Personal    Goal orientation: Reflection    Group therapy participation: Active    Therapeutic interventions reviewed and discussed: Yes    Gina Holloway  06/03/2013 GROUP THERAPY PROGRESS NOTE    The patient LINDAY RHODES is participating in Reflection Group.    Group time: 30 minutes    Personal goal for participation: Personal    Goal orientation: Reflection    Group therapy participation: Active    Therapeutic interventions reviewed and discussed: Yes    Gina Holloway  06/03/2013 GROUP THERAPY PROGRESS NOTE    The patient GENIYA FULGHAM is participating in Reflection Group.    Group time: 30  minutes    Personal goal for participation: Personal    Goal orientation: Reflection    Group therapy participation: Active    Therapeutic interventions reviewed and discussed: Yes    Gina Holloway  06/03/2013 GROUP THERAPY PROGRESS NOTE    The patient ALACIA REHMANN is participating in Reflection Group.    Group time: 30 minutes  Personal goal for participation: Personal    Goal orientation: Reflection    Group therapy participation: Active    Therapeutic interventions reviewed and discussed: Yes    Gina Holloway  06/03/2013 GROUP THERAPY PROGRESS NOTE    The patient LATYRA JAYE is participating in Reflection Group.    Group time: 30 minutes    Personal goal for participation: Personal    Goal orientation: Reflection    Group therapy participation: Active    Therapeutic interventions reviewed and discussed: Yes    Gina Holloway  06/03/2013 GROUP THERAPY PROGRESS NOTE    The patient SHEFALI NG is participating in Reflection Group.    Group time: 30 minutes    Personal goal for participation: Personal    Goal orientation: Reflection    Group therapy participation: Active    Therapeutic interventions reviewed and discussed: Yes    Gina Holloway  06/03/2013 GROUP THERAPY PROGRESS NOTE    The patient KIPPY MELENA is participating in Reflection Group.    Group time: 30 minutes    Personal goal for participation: Personal    Goal orientation: Reflection    Group therapy participation: Active    Therapeutic interventions reviewed and discussed: Yes    Gina Holloway  06/03/2013 GROUP THERAPY PROGRESS NOTE    The patient RONNETTE RUMP is participating in Reflection Group.    Group time: 30 minutes    Personal goal for participation: Personal    Goal orientation: Reflection    Group therapy participation: Active    Therapeutic interventions reviewed and discussed: Yes    Gina Holloway  06/03/2013 GROUP THERAPY PROGRESS NOTE    The patient MERI PELOT  is participating in Reflection Group.    Group time: 30 minutes    Personal goal for participation: Personal    Goal orientation: Reflection    Group therapy participation: Active    Therapeutic interventions reviewed and discussed: Yes    Gina Holloway  06/03/2013 GROUP THERAPY PROGRESS NOTE    The patient MINTIE WITHERINGTON is participating in Reflection Group.    Group time: 30 minutes    Personal goal for participation: Personal    Goal orientation: Reflection    Group therapy participation: Active    Therapeutic interventions reviewed and discussed: Yes    Gina Holloway  06/03/2013 GROUP THERAPY PROGRESS NOTE    The patient MARILENE VATH is participating in Reflection Group.    Group time: 30 minutes    Personal goal for participation: Personal    Goal orientation: Reflection    Group therapy participation: Active    Therapeutic interventions reviewed and discussed: Yes    Gina Holloway  06/03/2013 GROUP THERAPY PROGRESS NOTE    The patient ARMEDA PLUMB is participating in Reflection Group.    Group time: 30 minutes    Personal goal for participation: Personal    Goal orientation: Reflection    Group therapy participation: Active    Therapeutic interventions reviewed and discussed: Yes    Gina Holloway  06/03/2013 GROUP THERAPY PROGRESS NOTE    The patient JEYDA SIEBEL is participating in Reflection Group.    Group time: 30 minutes    Personal goal for participation: Personal    Goal orientation: Reflection    Group therapy participation: Active    Therapeutic interventions reviewed and discussed: Yes    Gina Holloway  06/03/2013 GROUP THERAPY PROGRESS NOTE    The patient TYSHEENA GINZBURG is participating in Reflection  Group.    Group time: 30 minutes    Personal goal for participation: Personal    Goal orientation: Reflection    Group therapy participation: Active    Therapeutic interventions reviewed and discussed: Yes    Gina Holloway  06/03/2013 GROUP  THERAPY PROGRESS NOTE    The patient ZAYANA SALVADOR is participating in Reflection Group.    Group time: 30 minutes    Personal goal for participation: Personal    Goal orientation: Reflection    Group therapy participation: Active    Therapeutic interventions reviewed and discussed: Yes    Gina Holloway  06/03/2013 GROUP THERAPY PROGRESS NOTE    The patient FLORENDA WATT is participating in Reflection Group.    Group time: 30 minutes    Personal goal for participation: Personal    Goal orientation: Reflection    Group therapy participation: Active    Therapeutic interventions reviewed and discussed: Yes    Gina Holloway  06/03/2013 GROUP THERAPY PROGRESS NOTE    The patient SANDIA PFUND is participating in Reflection Group.    Group time: 30 minutes    Personal goal for participation: Personal    Goal orientation: Reflection    Group therapy participation: Active    Therapeutic interventions reviewed and discussed: Yes    Gina Holloway  06/03/2013 GROUP THERAPY PROGRESS NOTE    The patient DEMI TRIEU is participating in Reflection Group.    Group time: 30 minutes    Personal goal for participation: Personal    Goal orientation: Reflection    Group therapy participation: Active    Therapeutic interventions reviewed and discussed: Yes    Gina Holloway  06/03/2013 GROUP THERAPY PROGRESS NOTE    The patient JODILYN GIESE is participating in Reflection Group.    Group time: 30 minutes    Personal goal for participation: Personal    Goal orientation: Reflection    Group therapy participation: Active    Therapeutic interventions reviewed and discussed: Yes    Gina Holloway  06/03/2013 GROUP THERAPY PROGRESS NOTE    The patient SELENI MELLER is participating in Reflection Group.    Group time: 30 minutes    Personal goal for participation: Personal    Goal orientation: Reflection    Group therapy participation: Active    Therapeutic interventions reviewed and  discussed: Yes    Gina Holloway  06/03/2013 GROUP THERAPY PROGRESS NOTE    The patient MYRTLE HALLER is participating in Reflection Group.    Group time: 30 minutes    Personal goal for participation: Personal    Goal orientation: Reflection    Group therapy participation: Active    Therapeutic interventions reviewed and discussed: Yes    Gina Holloway  06/03/2013 GROUP THERAPY PROGRESS NOTE    The patient SANAM MARMO is participating in Reflection Group.    Group time: 30 minutes    Personal goal for participation: Personal    Goal orientation: Reflection    Group therapy participation: Active    Therapeutic interventions reviewed and discussed: Yes    Gina Holloway  06/03/2013 GROUP THERAPY PROGRESS NOTE    The patient JOSEFINA RYNDERS is participating in Reflection Group.    Group time: 30 minutes    Personal goal for participation: Personal    Goal orientation: Reflection    Group therapy participation: Active    Therapeutic interventions reviewed and discussed: Yes    Gina Holloway  06/03/2013 GROUP THERAPY PROGRESS NOTE  The patient ZERAH HILYER is participating in Reflection Group.    Group time: 30 minutes    Personal goal for participation: Personal    Goal orientation: Reflection    Group therapy participation: Active    Therapeutic interventions reviewed and discussed: Yes    Gina Holloway  06/03/2013 GROUP THERAPY PROGRESS NOTE    The patient KELLAN BOEHLKE is participating in Reflection Group.    Group time: 30 minutes    Personal goal for participation: Personal    Goal orientation: Reflection    Group therapy participation: Active    Therapeutic interventions reviewed and discussed: Yes    Gina Holloway  06/03/2013 GROUP THERAPY PROGRESS NOTE    The patient MINERVA BLUETT is participating in Reflection Group.    Group time: 30 minutes    Personal goal for participation: Personal    Goal orientation: Reflection    Group therapy  participation: Active    Therapeutic interventions reviewed and discussed: Yes    Gina Holloway  06/03/2013 GROUP THERAPY PROGRESS NOTE    The patient JESLY HARTMANN is participating in Reflection Group.    Group time: 30 minutes    Personal goal for participation: Personal    Goal orientation: Reflection    Group therapy participation: Active    Therapeutic interventions reviewed and discussed: Yes    Gina Holloway  06/03/2013 GROUP THERAPY PROGRESS NOTE    The patient ELIZ NIGG is participating in Reflection Group.    Group time: 30 minutes    Personal goal for participation: Personal    Goal orientation: Reflection    Group therapy participation: Active    Therapeutic interventions reviewed and discussed: Yes    Gina Holloway  06/03/2013 GROUP THERAPY PROGRESS NOTE    The patient TYTEANNA OST is participating in Reflection Group.    Group time: 30 minutes    Personal goal for participation: Personal    Goal orientation: Reflection    Group therapy participation: Active    Therapeutic interventions reviewed and discussed: Yes    Gina Holloway  06/03/2013 GROUP THERAPY PROGRESS NOTE    The patient KENZLIE DISCH is participating in Reflection Group.    Group time: 30 minutes    Personal goal for participation: Personal    Goal orientation: Reflection    Group therapy participation: Active    Therapeutic interventions reviewed and discussed: Yes    Gina Holloway  06/03/2013 GROUP THERAPY PROGRESS NOTE    The patient ALYVIAH CRANDLE is participating in Reflection Group.    Group time: 30 minutes    Personal goal for participation: Personal    Goal orientation: Reflection    Group therapy participation: Active    Therapeutic interventions reviewed and discussed: Yes    Gina Holloway  06/03/2013 GROUP THERAPY PROGRESS NOTE    The patient MARKIA KYER is participating in Reflection Group.    Group time: 30 minutes    Personal goal for participation:  Personal    Goal orientation: Reflection    Group therapy participation: Active    Therapeutic interventions reviewed and discussed: Yes    Gina Holloway  06/03/2013 GROUP THERAPY PROGRESS NOTE    The patient ZAMYAH WIESMAN is participating in Reflection Group.    Group time: 30 minutes    Personal goal for participation: Personal    Goal orientation: Reflection    Group therapy participation: Active    Therapeutic interventions reviewed and discussed: Yes  Gina Holloway  06/03/2013 GROUP THERAPY PROGRESS NOTE    The patient JULIANNA VANWAGNER is participating in Reflection Group.    Group time: 30 minutes    Personal goal for participation: Personal    Goal orientation: Reflection    Group therapy participation: Active    Therapeutic interventions reviewed and discussed: Yes    Gina Holloway  06/03/2013 GROUP THERAPY PROGRESS NOTE    The patient LILLE KARIM is participating in Reflection Group.    Group time: 30 minutes    Personal goal for participation: Personal    Goal orientation: Reflection    Group therapy participation: Active    Therapeutic interventions reviewed and discussed: Yes    Gina Holloway  06/03/2013 GROUP THERAPY PROGRESS NOTE    The patient MALENI SEYER is participating in Reflection Group.    Group time: 30 minutes    Personal goal for participation: Personal    Goal orientation: Reflection    Group therapy participation: Active    Therapeutic interventions reviewed and discussed: Yes    Gina Holloway  06/03/2013 GROUP THERAPY PROGRESS NOTE    The patient PAULA BUSENBARK is participating in Reflection Group.    Group time: 30 minutes    Personal goal for participation: Personal    Goal orientation: Reflection    Group therapy participation: Active    Therapeutic interventions reviewed and discussed: Yes    Gina Holloway  06/03/2013 GROUP THERAPY PROGRESS NOTE    The patient MAKHYA ARAVE is participating in Reflection Group.     Group time: 30 minutes    Personal goal for participation: Personal    Goal orientation: Reflection    Group therapy participation: Active    Therapeutic interventions reviewed and discussed: Yes    Gina Holloway  06/03/2013 GROUP THERAPY PROGRESS NOTE    The patient AFOMIA BLACKLEY is participating in Reflection Group.    Group time: 30 minutes    Personal goal for participation: Personal    Goal orientation: Reflection    Group therapy participation: Active    Therapeutic interventions reviewed and discussed: Yes    Gina Holloway  06/03/2013 GROUP THERAPY PROGRESS NOTE    The patient PAULETT KAUFHOLD is participating in Reflection Group.    Group time: 30 minutes    Personal goal for participation: Personal    Goal orientation: Reflection    Group therapy participation: Active    Therapeutic interventions reviewed and discussed: Yes    Gina Holloway  06/03/2013 GROUP THERAPY PROGRESS NOTE    The patient ZACHARY LOVINS is participating in Reflection Group.    Group time: 30 minutes    Personal goal for participation: Personal    Goal orientation: Reflection    Group therapy participation: Active    Therapeutic interventions reviewed and discussed: Yes    Gina Holloway  06/03/2013 GROUP THERAPY PROGRESS NOTE    The patient DANIJELA VESSEY is participating in Reflection Group.    Group time: 30 minutes    Personal goal for participation: Personal    Goal orientation: Reflection    Group therapy participation: Active    Therapeutic interventions reviewed and discussed: Yes    Gina Holloway  06/03/2013 GROUP THERAPY PROGRESS NOTE    The patient NYIMAH SHADDUCK is participating in Reflection Group.    Group time: 30 minutes    Personal goal for participation: Personal    Goal orientation: Reflection    Group therapy participation: Active  Therapeutic interventions reviewed and discussed: Yes    Gina Holloway  06/03/2013 GROUP THERAPY PROGRESS NOTE    The patient  ROIZY HAROLD is participating in Reflection Group.    Group time: 30 minutes    Personal goal for participation: Personal    Goal orientation: Reflection    Group therapy participation: Active    Therapeutic interventions reviewed and discussed: Yes    Gina Holloway  06/03/2013 GROUP THERAPY PROGRESS NOTE    The patient AJIAH MCGLINN is participating in Reflection Group.    Group time: 30 minutes    Personal goal for participation: Personal    Goal orientation: Reflection    Group therapy participation: Active    Therapeutic interventions reviewed and discussed: Yes    Gina Holloway  06/03/2013 GROUP THERAPY PROGRESS NOTE    The patient JOYEL CHENETTE is participating in Reflection Group.    Group time: 30 minutes    Personal goal for participation: Personal    Goal orientation: Reflection    Group therapy participation: Active    Therapeutic interventions reviewed and discussed: Yes    Gina Holloway  06/03/2013 GROUP THERAPY PROGRESS NOTE    The patient EMMYLOU BIEKER is participating in Reflection Group.    Group time: 30 minutes    Personal goal for participation: Personal    Goal orientation: Reflection    Group therapy participation: Active    Therapeutic interventions reviewed and discussed: Yes    Gina Holloway  06/03/2013 GROUP THERAPY PROGRESS NOTE    The patient BREEANN REPOSA is participating in Reflection Group.    Group time: 30 minutes    Personal goal for participation: Personal    Goal orientation: Reflection    Group therapy participation: Active    Therapeutic interventions reviewed and discussed: Yes    Gina Holloway  06/03/2013 GROUP THERAPY PROGRESS NOTE    The patient TINIKA BUCKNAM is participating in Reflection Group.    Group time: 30 minutes    Personal goal for participation: Personal    Goal orientation: Reflection    Group therapy participation: Active    Therapeutic interventions reviewed and discussed: Yes    Gina Holloway   06/03/2013 GROUP THERAPY PROGRESS NOTE    The patient JASELYNN TAMAS is participating in Reflection Group.    Group time: 30 minutes    Personal goal for participation: Personal    Goal orientation: Reflection    Group therapy participation: Active    Therapeutic interventions reviewed and discussed: Yes    Gina Holloway  06/03/2013 GROUP THERAPY PROGRESS NOTE    The patient ABBEGAIL MATUSKA is participating in Reflection Group.    Group time: 30 minutes    Personal goal for participation: Personal    Goal orientation: Reflection    Group therapy participation: Active    Therapeutic interventions reviewed and discussed: Yes    Gina Holloway  06/03/2013 GROUP THERAPY PROGRESS NOTE    The patient NAASIA WEILBACHER is participating in Reflection Group.    Group time: 30 minutes    Personal goal for participation: Personal    Goal orientation: Reflection    Group therapy participation: Active    Therapeutic interventions reviewed and discussed: Yes    Gina Holloway  06/03/2013 GROUP THERAPY PROGRESS NOTE    The patient CAMEY EDELL is participating in Reflection Group.    Group time: 30 minutes    Personal goal for participation: Personal    Goal  orientation: Reflection    Group therapy participation: Active    Therapeutic interventions reviewed and discussed: Yes    Gina Holloway  06/03/2013 GROUP THERAPY PROGRESS NOTE    The patient SHAKARIA RAPHAEL is participating in Reflection Group.    Group time: 30 minutes    Personal goal for participation: Personal    Goal orientation: Reflection    Group therapy participation: Active    Therapeutic interventions reviewed and discussed: Yes    Gina Holloway  06/03/2013 GROUP THERAPY PROGRESS NOTE    The patient RYLAND SMOOTS is participating in Reflection Group.    Group time: 30 minutes    Personal goal for participation: Personal    Goal orientation: Reflection    Group therapy participation: Active    Therapeutic  interventions reviewed and discussed: Yes    Gina Holloway  06/03/2013 GROUP THERAPY PROGRESS NOTE    The patient AVEREY KONING is participating in Reflection Group.    Group time: 30 minutes    Personal goal for participation: Personal    Goal orientation: Reflection    Group therapy participation: Active    Therapeutic interventions reviewed and discussed: Yes    Gina Holloway  06/03/2013 GROUP THERAPY PROGRESS NOTE    The patient CHERRISH VITALI is participating in Reflection Group.    Group time: 30 minutes    Personal goal for participation: Personal    Goal orientation: Reflection    Group therapy participation: Active    Therapeutic interventions reviewed and discussed: Yes    Gina Holloway  06/03/2013 GROUP THERAPY PROGRESS NOTE    The patient KEAISHA SUBLETTE is participating in Reflection Group.    Group time: 30 minutes    Personal goal for participation: Personal    Goal orientation: Reflection    Group therapy participation: Active    Therapeutic interventions reviewed and discussed: Yes    Gina Holloway  06/03/2013 GROUP THERAPY PROGRESS NOTE    The patient NATALYE KOTT is participating in Reflection Group.    Group time: 30 minutes    Personal goal for participation: Personal    Goal orientation: Reflection    Group therapy participation: Active    Therapeutic interventions reviewed and discussed: Yes    Gina Holloway  06/03/2013 GROUP THERAPY PROGRESS NOTE    The patient PHILAMENA KRAMAR is participating in Reflection Group.    Group time: 30 minutes    Personal goal for participation: Personal    Goal orientation: Reflection    Group therapy participation: Active    Therapeutic interventions reviewed and discussed: Yes    Gina Holloway  06/03/2013 GROUP THERAPY PROGRESS NOTE    The patient ZARIN HAGMANN is participating in Reflection Group.    Group time: 30 minutes    Personal goal for participation: Personal    Goal orientation: Reflection     Group therapy participation: Active    Therapeutic interventions reviewed and discussed: Yes    Gina Holloway  06/03/2013 GROUP THERAPY PROGRESS NOTE    The patient JACOB CHAMBLEE is participating in Reflection Group.    Group time: 30 minutes    Personal goal for participation: Personal    Goal orientation: Reflection    Group therapy participation: Active    Therapeutic interventions reviewed and discussed: Yes    Gina Holloway  06/03/2013 GROUP THERAPY PROGRESS NOTE    The patient LACHE DAGHER is participating in Reflection Group.    Group time: 30 minutes  Personal goal for participation: Personal    Goal orientation: Reflection    Group therapy participation: Active    Therapeutic interventions reviewed and discussed: Yes    Gina Holloway  06/03/2013 GROUP THERAPY PROGRESS NOTE    The patient SHANTI AGRESTI is participating in Reflection Group.    Group time: 30 minutes    Personal goal for participation: Personal    Goal orientation: Reflection    Group therapy participation: Active    Therapeutic interventions reviewed and discussed: Yes    Gina Holloway  06/03/2013 GROUP THERAPY PROGRESS NOTE    The patient PHOEBE MARTER is participating in Reflection Group.    Group time: 30 minutes    Personal goal for participation: Personal    Goal orientation: Reflection    Group therapy participation: Active    Therapeutic interventions reviewed and discussed: Yes    Gina Holloway  06/03/2013 GROUP THERAPY PROGRESS NOTE    The patient JAZARIA JARECKI is participating in Reflection Group.    Group time: 30 minutes    Personal goal for participation: Personal    Goal orientation: Reflection    Group therapy participation: Active    Therapeutic interventions reviewed and discussed: Yes    Gina Holloway  06/03/2013 GROUP THERAPY PROGRESS NOTE    The patient JALONDA ANTIGUA is participating in Reflection Group.    Group time: 30 minutes    Personal goal for  participation: Personal    Goal orientation: Reflection    Group therapy participation: Active    Therapeutic interventions reviewed and discussed: Yes    Gina Holloway  06/03/2013 GROUP THERAPY PROGRESS NOTE    The patient INDRIA BISHARA is participating in Reflection Group.    Group time: 30 minutes    Personal goal for participation: Personal    Goal orientation: Reflection    Group therapy participation: Active    Therapeutic interventions reviewed and discussed: Yes    Gina Holloway  06/03/2013 GROUP THERAPY PROGRESS NOTE    The patient EZRA MARQUESS is participating in Reflection Group.    Group time: 30 minutes    Personal goal for participation: Personal    Goal orientation: Reflection    Group therapy participation: Active    Therapeutic interventions reviewed and discussed: Yes    Gina Holloway  06/03/2013 GROUP THERAPY PROGRESS NOTE    The patient VALENCIA KASSA is participating in Reflection Group.    Group time: 30 minutes    Personal goal for participation: Personal    Goal orientation: Reflection    Group therapy participation: Active    Therapeutic interventions reviewed and discussed: Yes    Gina Holloway  06/03/2013 GROUP THERAPY PROGRESS NOTE    The patient DOLOROS KWOLEK is participating in Reflection Group.    Group time: 30 minutes    Personal goal for participation: Personal    Goal orientation: Reflection    Group therapy participation: Active    Therapeutic interventions reviewed and discussed: Yes    Gina Holloway  06/03/2013 GROUP THERAPY PROGRESS NOTE    The patient ASHAKI FROSCH is participating in Reflection Group.    Group time: 30 minutes    Personal goal for participation: Personal    Goal orientation: Reflection    Group therapy participation: Active    Therapeutic interventions reviewed and discussed: Yes    Gina Holloway  06/03/2013 GROUP THERAPY PROGRESS NOTE    The patient HARBOUR NORDMEYER is participating in Reflection  Group.    Group time: 30 minutes    Personal goal for participation: Personal    Goal orientation: Reflection    Group therapy participation: Active    Therapeutic interventions reviewed and discussed: Yes    Gina Coyerthel Barnett-Johnson  06/03/2013 GROUP THERAPY PROGRESS NOTE    The patient Devota PaceShirley H Vandoren is participating in Reflection Group.    Group time: 30 minutes    Personal goal for participation: Personal    Goal orientation: Reflection    Group therapy participation: Active    Therapeutic interventions reviewed and discussed: Yes    Gina Coyerthel Barnett-Johnson  06/03/2013 GROUP THERAPY PROGRESS NOTE    The patient Devota PaceShirley H Salvi is participating in Reflection Group.    Group time: 30 minutes    Personal goal for participation: Personal    Goal orientation: Reflection    Group therapy participation: Active    Therapeutic interventions reviewed and discussed: Yes    Gina Coyerthel Barnett-Johnson  06/03/2013 GROUP THERAPY PROGRESS NOTE    The patient Devota PaceShirley H Cazier is participating in Reflection Group.    Group time: 30 minutes    Personal goal for participation: Personal    Goal orientation: Reflection    Group therapy participation: Active    Therapeutic interventions reviewed and discussed: Yes    Gina Coyerthel Barnett-Johnson  06/03/2013 GROUP THERAPY PROGRESS NOTE    The patient Devota PaceShirley H Arther is participating in Reflection Group.    Group time: 30 minutes    Personal goal for participation: Personal    Goal orientation: Reflection    Group therapy participation: Active    Therapeutic interventions reviewed and discussed: Yes    Gina Coyerthel Barnett-Johnson  06/03/2013 GROUP THERAPY PROGRESS NOTE    The patient Devota PaceShirley H Vickrey is participating in Reflection Group.    Group time: 30 minutes    Personal goal for participation: Personal    Goal orientation: Reflection    Group therapy participation: Active    Therapeutic interventions reviewed and discussed: Yes    Gina Coyerthel Barnett-Johnson  06/03/2013 GROUP THERAPY PROGRESS NOTE    The  patient Devota PaceShirley H Heslop is participating in Reflection Group.    Group time: 30 minutes    Personal goal for participation: Personal    Goal orientation: Reflection    Group therapy participation: Active    Therapeutic interventions reviewed and discussed: Yes    Gina Coyerthel Barnett-Johnson  06/03/2013 GROUP THERAPY PROGRESS NOTE    The patient Devota PaceShirley H Armwood is participating in Reflection Group.    Group time: 30 minutes    Personal goal for participation: Personal    Goal orientation: Reflection    Group therapy participation: Active    Therapeutic interventions reviewed and discussed: Yes    Gina Coyerthel Barnett-Johnson  06/03/2013 GROUP THERAPY PROGRESS NOTE    The patient Devota PaceShirley H Nowack is participating in Reflection Group.    Group time: 30 minutes    Personal goal for participation: Personal    Goal orientation: Reflection    Group therapy participation: Active    Therapeutic interventions reviewed and discussed: Yes    Gina Coyerthel Barnett-Johnson  06/03/2013 GROUP THERAPY PROGRESS NOTE    The patient Devota PaceShirley H Lingelbach is participating in Reflection Group.    Group time: 30 minutes    Personal goal for participation: Personal    Goal orientation: Reflection    Group therapy participation: Active    Therapeutic interventions reviewed and discussed: Yes    Gina Coyerthel Barnett-Johnson  06/03/2013 GROUP THERAPY PROGRESS NOTE  The patient AREYANNA FIGEROA is participating in Reflection Group.    Group time: 30 minutes    Personal goal for participation: Personal    Goal orientation: Reflection    Group therapy participation: Active    Therapeutic interventions reviewed and discussed: Yes    Gina Holloway  06/03/2013 GROUP THERAPY PROGRESS NOTE    The patient DVORA BUITRON is participating in Reflection Group.    Group time: 30 minutes    Personal goal for participation: Personal    Goal orientation: Reflection    Group therapy participation: Active    Therapeutic interventions reviewed and discussed: Yes    Gina Holloway  06/03/2013 GROUP THERAPY PROGRESS NOTE    The patient MAYAN DOLNEY is participating in Reflection Group.    Group time: 30 minutes    Personal goal for participation: Personal    Goal orientation: Reflection    Group therapy participation: Active    Therapeutic interventions reviewed and discussed: Yes    Gina Holloway  06/03/2013 GROUP THERAPY PROGRESS NOTE    The patient KANI JOBSON is participating in Reflection Group.    Group time: 30 minutes    Personal goal for participation: Personal    Goal orientation: Reflection    Group therapy participation: Active    Therapeutic interventions reviewed and discussed: Yes    Gina Holloway  06/03/2013 GROUP THERAPY PROGRESS NOTE    The patient KATYANA TROLINGER is participating in Reflection Group.    Group time: 30 minutes    Personal goal for participation: Personal    Goal orientation: Reflection    Group therapy participation: Active    Therapeutic interventions reviewed and discussed: Yes    Gina Holloway  06/03/2013 GROUP THERAPY PROGRESS NOTE    The patient LIZBETT GARCIAGARCIA is participating in Reflection Group.    Group time: 30 minutes    Personal goal for participation: Personal    Goal orientation: Reflection    Group therapy participation: Active    Therapeutic interventions reviewed and discussed: Yes    Gina Holloway  06/03/2013 GROUP THERAPY PROGRESS NOTE    The patient ROSEALEE RECINOS is participating in Reflection Group.    Group time: 30 minutes    Personal goal for participation: Personal    Goal orientation: Reflection    Group therapy participation: Active    Therapeutic interventions reviewed and discussed: Yes    Gina Holloway  06/03/2013 GROUP THERAPY PROGRESS NOTE    The patient TIWANA CHAVIS is participating in Reflection Group.    Group time: 30 minutes    Personal goal for participation: Personal    Goal orientation: Reflection    Group therapy participation: Active     Therapeutic interventions reviewed and discussed: Yes    Gina Holloway  06/03/2013 GROUP THERAPY PROGRESS NOTE    The patient RELDA AGOSTO is participating in Reflection Group.    Group time: 30 minutes    Personal goal for participation: Personal    Goal orientation: Reflection    Group therapy participation: Active    Therapeutic interventions reviewed and discussed: Yes    Gina Holloway  06/03/2013 GROUP THERAPY PROGRESS NOTE    The patient NATSUMI WHITSITT is participating in Reflection Group.    Group time: 30 minutes    Personal goal for participation: Personal    Goal orientation: Reflection    Group therapy participation: Active    Therapeutic interventions reviewed and discussed: Yes  Gina Holloway  06/03/2013 GROUP THERAPY PROGRESS NOTE    The patient AVALENE SEALY is participating in Reflection Group.    Group time: 30 minutes    Personal goal for participation: Personal    Goal orientation: Reflection    Group therapy participation: Active    Therapeutic interventions reviewed and discussed: Yes    Gina Holloway  06/03/2013 GROUP THERAPY PROGRESS NOTE    The patient SHADE RIVENBARK is participating in Reflection Group.    Group time: 30 minutes    Personal goal for participation: Personal    Goal orientation: Reflection    Group therapy participation: Active    Therapeutic interventions reviewed and discussed: Yes    Gina Holloway  06/03/2013 GROUP THERAPY PROGRESS NOTE    The patient MEKA LEWAN is participating in Reflection Group.    Group time: 30 minutes    Personal goal for participation: Personal    Goal orientation: Reflection    Group therapy participation: Active    Therapeutic interventions reviewed and discussed: Yes    Gina Holloway  06/03/2013 GROUP THERAPY PROGRESS NOTE    The patient LIANNY MOLTER is participating in Reflection Group.    Group time: 30 minutes    Personal goal for participation: Personal    Goal  orientation: Reflection    Group therapy participation: Active    Therapeutic interventions reviewed and discussed: Yes    Gina Holloway  06/03/2013 GROUP THERAPY PROGRESS NOTE    The patient SHABRIA EGLEY is participating in Reflection Group.    Group time: 30 minutes    Personal goal for participation: Personal    Goal orientation: Reflection    Group therapy participation: Active    Therapeutic interventions reviewed and discussed: Yes    Gina Holloway  06/03/2013 GROUP THERAPY PROGRESS NOTE    The patient MRYTLE BENTO is participating in Reflection Group.    Group time: 30 minutes    Personal goal for participation: Personal    Goal orientation: Reflection    Group therapy participation: Active    Therapeutic interventions reviewed and discussed: Yes    Gina Holloway  06/03/2013 GROUP THERAPY PROGRESS NOTE    The patient SHAVONTAE GIBEAULT is participating in Reflection Group.    Group time: 30 minutes    Personal goal for participation: Personal    Goal orientation: Reflection    Group therapy participation: Active    Therapeutic interventions reviewed and discussed: Yes    Gina Holloway  06/03/2013 GROUP THERAPY PROGRESS NOTE    The patient BERDIE MALTER is participating in Reflection Group.    Group time: 30 minutes    Personal goal for participation: Personal    Goal orientation: Reflection    Group therapy participation: Active    Therapeutic interventions reviewed and discussed: Yes    Gina Holloway  06/03/2013 GROUP THERAPY PROGRESS NOTE    The patient JACQULYNN SHAPPELL is participating in Reflection Group.    Group time: 30 minutes    Personal goal for participation: Personal    Goal orientation: Reflection    Group therapy participation: Active    Therapeutic interventions reviewed and discussed: Yes    Gina Holloway  06/03/2013 GROUP THERAPY PROGRESS NOTE    The patient LUCILLIE KIESEL is participating in Reflection Group.    Group time: 30  minutes    Personal goal for participation: Personal    Goal orientation: Reflection    Group therapy participation: Active  Therapeutic interventions reviewed and discussed: Yes    Gina Holloway  06/03/2013 GROUP THERAPY PROGRESS NOTE    The patient DAYSIA VANDENBOOM is participating in Reflection Group.    Group time: 30 minutes    Personal goal for participation: Personal    Goal orientation: Reflection    Group therapy participation: Active    Therapeutic interventions reviewed and discussed: Yes    Gina Holloway  06/03/2013 GROUP THERAPY PROGRESS NOTE    The patient ROIZA WIEDEL is participating in Reflection Group.    Group time: 30 minutes    Personal goal for participation: Personal    Goal orientation: Reflection    Group therapy participation: Active    Therapeutic interventions reviewed and discussed: Yes    Gina Holloway  06/03/2013 GROUP THERAPY PROGRESS NOTE    The patient ZEA KOSTKA is participating in Reflection Group.    Group time: 30 minutes    Personal goal for participation: Personal    Goal orientation: Reflection    Group therapy participation: Active    Therapeutic interventions reviewed and discussed: Yes    Gina Holloway  06/03/2013 GROUP THERAPY PROGRESS NOTE    The patient MAYCIE LUERA is participating in Reflection Group.    Group time: 30 minutes    Personal goal for participation: Personal    Goal orientation: Reflection    Group therapy participation: Active    Therapeutic interventions reviewed and discussed: Yes    Gina Holloway  06/03/2013 GROUP THERAPY PROGRESS NOTE    The patient TERRAH DECOSTER is participating in Reflection Group.    Group time: 30 minutes    Personal goal for participation: Personal    Goal orientation: Reflection    Group therapy participation: Active    Therapeutic interventions reviewed and discussed: Yes    Gina Holloway  06/03/2013 GROUP THERAPY PROGRESS NOTE    The patient ALBA PERILLO  is participating in Reflection Group.    Group time: 30 minutes    Personal goal for participation: Personal    Goal orientation: Reflection    Group therapy participation: Active    Therapeutic interventions reviewed and discussed: Yes    Gina Holloway  06/03/2013 GROUP THERAPY PROGRESS NOTE    The patient SCHYLAR ALLARD is participating in Reflection Group.    Group time: 30 minutes    Personal goal for participation: Personal    Goal orientation: Reflection    Group therapy participation: Active    Therapeutic interventions reviewed and discussed: Yes    Gina Holloway  06/03/2013 GROUP THERAPY PROGRESS NOTE    The patient CANDUS BRAUD is participating in Reflection Group.    Group time: 30 minutes    Personal goal for participation: Personal    Goal orientation: Reflection    Group therapy participation: Active    Therapeutic interventions reviewed and discussed: Yes    Gina Holloway  06/03/2013 GROUP THERAPY PROGRESS NOTE    The patient KHRISTIE SAK is participating in Reflection Group.    Group time: 30 minutes    Personal goal for participation: Personal    Goal orientation: Reflection    Group therapy participation: Active    Therapeutic interventions reviewed and discussed: Yes    Gina Holloway  06/03/2013 GROUP THERAPY PROGRESS NOTE    The patient GIULIA HICKEY is participating in Reflection Group.    Group time: 30 minutes    Personal goal for participation: Personal    Goal orientation:  Reflection    Group therapy participation: Active    Therapeutic interventions reviewed and discussed: Yes    Gina Holloway  06/03/2013 GROUP THERAPY PROGRESS NOTE    The patient ZORIAH PULICE is participating in Reflection Group.    Group time: 30 minutes    Personal goal for participation: Personal    Goal orientation: Reflection    Group therapy participation: Active    Therapeutic interventions reviewed and discussed: Yes    Gina Holloway  06/03/2013 GROUP  THERAPY PROGRESS NOTE    The patient JETAIME PINNIX is participating in Reflection Group.    Group time: 30 minutes    Personal goal for participation: Personal    Goal orientation: Reflection    Group therapy participation: Active    Therapeutic interventions reviewed and discussed: Yes    Gina Holloway  06/03/2013 GROUP THERAPY PROGRESS NOTE    The patient LISA MILIAN is participating in Reflection Group.    Group time: 30 minutes    Personal goal for participation: Personal    Goal orientation: Reflection    Group therapy participation: Active    Therapeutic interventions reviewed and discussed: Yes    Gina Holloway  06/03/2013 GROUP THERAPY PROGRESS NOTE    The patient MISHEEL GOWANS is participating in Reflection Group.    Group time: 30 minutes    Personal goal for participation: Personal    Goal orientation: Reflection    Group therapy participation: Active    Therapeutic interventions reviewed and discussed: Yes    Gina Holloway  06/03/2013 GROUP THERAPY PROGRESS NOTE    The patient LIBNI FUSARO is participating in Reflection Group.    Group time: 30 minutes    Personal goal for participation: Personal    Goal orientation: Reflection    Group therapy participation: Active    Therapeutic interventions reviewed and discussed: Yes    Gina Holloway  06/03/2013 GROUP THERAPY PROGRESS NOTE    The patient CHAREE TUMBLIN is participating in Reflection Group.    Group time: 30 minutes    Personal goal for participation: Personal    Goal orientation: Reflection    Group therapy participation: Active    Therapeutic interventions reviewed and discussed: Yes    Gina Holloway  06/03/2013 GROUP THERAPY PROGRESS NOTE    The patient CORBIN HOTT is participating in Reflection Group.    Group time: 30 minutes    Personal goal for participation: Personal    Goal orientation: Reflection    Group therapy participation: Active    Therapeutic interventions reviewed and  discussed: Yes    Gina Holloway  06/03/2013 GROUP THERAPY PROGRESS NOTE    The patient MARY SECORD is participating in Reflection Group.    Group time: 30 minutes    Personal goal for participation: Personal    Goal orientation: Reflection    Group therapy participation: Active    Therapeutic interventions reviewed and discussed: Yes    Gina Holloway  06/03/2013 GROUP THERAPY PROGRESS NOTE    The patient BRYAHNA LESKO is participating in Reflection Group.    Group time: 30 minutes    Personal goal for participation: Personal    Goal orientation: Reflection    Group therapy participation: Active    Therapeutic interventions reviewed and discussed: Yes    Gina Holloway  06/03/2013 GROUP THERAPY PROGRESS NOTE    The patient ZEPHYR SAUSEDO is participating in Reflection Group.    Group time: 30 minutes  Personal goal for participation: Personal    Goal orientation: Reflection    Group therapy participation: Active    Therapeutic interventions reviewed and discussed: Yes    Gina Holloway  06/03/2013 GROUP THERAPY PROGRESS NOTE    The patient DALEXA GENTZ is participating in Reflection Group.    Group time: 30 minutes    Personal goal for participation: Personal    Goal orientation: Reflection    Group therapy participation: Active    Therapeutic interventions reviewed and discussed: Yes    Gina Holloway  06/03/2013 GROUP THERAPY PROGRESS NOTE    The patient ALYXIS GRIPPI is participating in Reflection Group.    Group time: 30 minutes    Personal goal for participation: Personal    Goal orientation: Reflection    Group therapy participation: Active    Therapeutic interventions reviewed and discussed: Yes    Gina Holloway  06/03/2013 GROUP THERAPY PROGRESS NOTE    The patient MAKAYELA SECREST is participating in Reflection Group.    Group time: 30 minutes    Personal goal for participation: Personal    Goal orientation: Reflection    Group therapy  participation: Active    Therapeutic interventions reviewed and discussed: Yes    Gina Holloway  06/03/2013 GROUP THERAPY PROGRESS NOTE    The patient ANIYIA RANE is participating in Reflection Group.    Group time: 30 minutes    Personal goal for participation: Personal    Goal orientation: Reflection    Group therapy participation: Active    Therapeutic interventions reviewed and discussed: Yes    Gina Holloway  06/03/2013 GROUP THERAPY PROGRESS NOTE    The patient ROLENE ANDRADES is participating in Reflection Group.    Group time: 30 minutes    Personal goal for participation: Personal    Goal orientation: Reflection    Group therapy participation: Active    Therapeutic interventions reviewed and discussed: Yes    Gina Holloway  06/03/2013 GROUP THERAPY PROGRESS NOTE    The patient ALISANDRA SON is participating in Reflection Group.    Group time: 30 minutes    Personal goal for participation: Personal    Goal orientation: Reflection    Group therapy participation: Active    Therapeutic interventions reviewed and discussed: Yes    Gina Holloway  06/03/2013 GROUP THERAPY PROGRESS NOTE    The patient BRIONA KORPELA is participating in Reflection Group.    Group time: 30 minutes    Personal goal for participation: Personal    Goal orientation: Reflection    Group therapy participation: Active    Therapeutic interventions reviewed and discussed: Yes    Gina Holloway  06/03/2013 GROUP THERAPY PROGRESS NOTE    The patient APARNA VANDERWEELE is participating in Reflection Group.    Group time: 30 minutes    Personal goal for participation: Personal    Goal orientation: Reflection    Group therapy participation: Active    Therapeutic interventions reviewed and discussed: Yes    Gina Holloway  06/03/2013 GROUP THERAPY PROGRESS NOTE    The patient SHANELLE CLONTZ is participating in Reflection Group.    Group time: 30 minutes    Personal goal for participation:  Personal    Goal orientation: Reflection    Group therapy participation: Active    Therapeutic interventions reviewed and discussed: Yes    Gina Holloway  06/03/2013 GROUP THERAPY PROGRESS NOTE    The patient KESLEY MULLENS is participating in  Reflection Group.    Group time: 30 minutes    Personal goal for participation: Personal    Goal orientation: Reflection    Group therapy participation: Active    Therapeutic interventions reviewed and discussed: Yes    Gina Holloway  06/03/2013

## 2013-06-03 NOTE — Behavioral Health Treatment Team (Signed)
Patient seen, chart reviewed, staffing held and dictated report done. Please see dictated report for complete details.    This psychiatric evaluation required 70 minutes, with greater than 50% of   that time involving concert counseling of the patient. The evaluation   included a psychiatric interview of the patient, full review of the chart   and in consultation with family and staff.

## 2013-06-03 NOTE — Behavioral Health Treatment Team (Cosign Needed)
GROUP THERAPY PROGRESS NOTE    The patient Gina PaceShirley H Nagengast is participating in Reflection Group.    Group time: 30 minutes    Personal goal for participation: Personal    Goal orientation: Reflection    Group therapy participation: Active    Therapeutic interventions reviewed and discussed: Yes    Felicity Coyerthel Barnett-Johnson  06/03/2013

## 2013-06-03 NOTE — Behavioral Health Treatment Team (Signed)
INITIAL PSYCHIATRIC EVALUATION         IDENTIFICATION:    Patient Name  Gina Holloway   Date of Birth 14-Jul-1934   CSN [id number]   Medical Record Number  [id number]      Age  79 y.o.   PCP Anson Fret, MD   Admit date:  06/02/2013    Room Number  322/01  @ [facility]   Date of Service  06/03/2013            HISTORY         REASON FOR HOSPITALIZATION:  CC: "I am dizzy". Pt admitted under voluntary basis for severe depression with suicidal ideations, mild psychosis proving to be an imminent danger to self and others and an inability to care for self.      HISTORY OF PRESENT ILLNESS:  Pt was seen by me in the outpatient on 06/01/13 and was recommended for inpatient psych admission. Following is the HPI from outpatient initial Evaluation.   History of Present Illness: Gina Holloway is a 79 y.o. Divorced, AA female who presents with symptoms of depression, anxiety and cognitive decline. Patient was brought by her cousin, who appears very supportive and concerned. She was seen one time by Dr. Sherryl Barters and was referred for speciality care here.   Pt reports that no medications has worked for her depression, anxiety and nerves in last 3-4 years. She was initially seen by Dr. Jeannine Boga in July 2012 after couple of ED visits for depression/anxiety. She was started on Celexa and Remeron, which she only took for few days and stopped and did not FU with Dr. Jeannine Boga. She continued some Rx on and off with her PCP. Mostly non compliant. She was admitted to [facility] inpatient psych in November x 2013 and was d/c after 11 days on Remeron and low dose lorazepam with outpatient FU with Dr. Debbora Dus. She FU with him couple of times but was consistently non compliant. Her last visit there was in April 2014. Since then she has received psych medications from her PCP, Dr. Concepcion Elk. She was tried on Buspar and Xanax as well. Last month saw Dr. Bonna Gains and had had multiple ED visits.   Today  reports that she is not sleeping well, eating poorly, has lost weight, no energy and stays anxious all the time. She has significant psychomotor retardation. Responses are slow. No gross psychosis or mania. Denies any SI or HI.     MMSE: 19/30  GDS" 14/15    Pt was seen with the team today. She has severe psychomotor retardation and her body was shaking with anxiety. Reports that she does not eat or sleep well at home. Gets meals on wheels daily. Denies any AV hallucinations. Appers very scared. All her c/o anxiety is in somatic forms.      ALLERGIES:  Allergies   Allergen Reactions   ??? Biaxin [Clarithromycin] Unknown (comments)     Pt not sure what type of reaction   ??? Flagyl [Metronidazole] Unknown (comments)     Pt not sure of what type of reaction      MEDICATIONS PRIOR TO ADMISSION:  Prescriptions prior to admission   Medication Sig   ??? atorvastatin (LIPITOR) 40 mg tablet Take 40 mg by mouth daily. Indications: HYPERCHOLESTEROLEMIA   ??? metoprolol succinate (TOPROL-XL) 25 mg XL tablet Take 25 mg by mouth daily. Indications: HYPERTENSION   ??? cyanocobalamin (VITAMIN B-12) 500 mcg tablet Take 500 mcg by mouth daily.  Indications: PREVENTION OF VITAMIN B12 DEFICIENCY   ??? loratadine (CLARITIN) 10 mg tablet Take 10 mg by mouth daily as needed for Allergies. Indications: ALLERGIC RHINITIS   ??? aspirin 81 mg Tab Take 81 mg by mouth daily. Indications: MYOCARDIAL INFARCTION PREVENTION      PAST MEDICAL HISTORY:  Past Medical History   Diagnosis Date   ??? HTN (hypertension) 03/29/2008   ??? Hypercholesterolemia 03/29/2008   ??? GERD (gastroesophageal reflux disease) 03/29/2008   ??? Thyroid goiter 03/29/2008   ??? Osteoporosis 03/29/2008   ??? Arrhythmia    ??? Murmur    ??? Other chest pain    ??? Reflux esophagitis    ??? Anxiety    ??? Essential hypertension    ??? Depression    ??? Dementia      Past Surgical History   Procedure Laterality Date   ??? Hx cholecystectomy     ??? Hx tubal ligation     ??? Pr colonoscopy,diagnostic  02/18/2006     Dr Chancy Milroy       SOCIAL HISTORY:    Divorced. Has 4 daughters. Retired. Lives alone. 11 th grade educations.    FAMILY HISTORY: History reviewed.  Family History   Problem Relation Age of Onset   ??? Heart Disease Sister    ??? Arthritis-osteo Sister        REVIEW of SYSTEMS:   General ROS: positive for  - fatigue, sleep disturbance and weight loss  Psychological ROS: positive for - anxiety, depression and suicidal ideation  negative for - hallucinations  Neurological ROS: positive for - confusion, dizziness and tremors  Pertinent items are noted in the History of Present Illness. All other Systems reviewed and are considered negative.           MENTAL STATUS EXAM & VITALS       MENTAL STATUS EXAM:    FINDINGS WITHIN NORMAL LIMITS (WNL) UNLESS OTHERWISE STATED BELOW:    Orientation oriented to time, place and person   Vital Signs (BP,Pulse, Temp) See below (reviewed)   Gait and Station Within normal limits   Abnormal Muscular Movements/Tone/Behavior No EPS, no Tardive Dyskinesia, no abnormal muscular movements; wnl tone   Relations cooperative and passive   General Appearance:  age appropriate, casually dressed and thin & gaunt looking   Language No aphasia or dysarthria   Speech:  hypoverbal   Thought Processes illogical, wnl rate of thoughts, poor abstract reasoning and computation   Thought Associations circumstantial, illogical and tangential   Thought Content preoccupations   Suicidal Ideations none   Homicidal Ideations none   Mood:  anxious and depressed   Affect:  depressed and labile   Memory recent  impaired   Memory remote:  impaired   Concentration/Attention:  impaired   Fund of Knowledge Fair/average   Insight:  poor   Reliability poor   Judgment:  poor          VITALS:     Patient Vitals for the past 24 hrs:   Temp Pulse Resp BP   06/03/13 0700 97.1 ??F (36.2 ??C) 75 16 130/74 mmHg              DATA       LABORATORY DATA:  Labs Reviewed   METABOLIC PANEL, COMPREHENSIVE - Abnormal; Notable for the following:      BUN/Creatinine ratio 22 (*)     Albumin 3.1 (*)     A-G Ratio 0.9 (*)     All other components within  normal limits   URINALYSIS W/MICROSCOPIC - Abnormal; Notable for the following:     Leukocyte Esterase TRACE (*)     All other components within normal limits   GLUCOSE, FASTING   CBC W/O DIFF   TSH, 3RD GENERATION   LIPID PANEL   DRUG SCREEN, URINE     Admission on 06/02/2013   Component Date Value Ref Range Status   ??? Sodium 06/03/2013 142  136 - 145 mmol/L Final   ??? Potassium 06/03/2013 4.4  3.5 - 5.1 mmol/L Final   ??? Chloride 06/03/2013 103  97 - 108 mmol/L Final   ??? CO2 06/03/2013 31  21 - 32 mmol/L Final   ??? Anion gap 06/03/2013 8  5 - 15 mmol/L Final   ??? Glucose 06/03/2013 91  65 - 100 mg/dL Final   ??? BUN 06/03/2013 20  6 - 20 MG/DL Final   ??? Creatinine 06/03/2013 0.89  0.45 - 1.15 MG/DL Final   ??? BUN/Creatinine ratio 06/03/2013 22* 12 - 20   Final   ??? GFR est AA 06/03/2013 >60  >60 ml/min/1.50m Final   ??? GFR est non-AA 06/03/2013 >60  >60 ml/min/1.765mFinal   ??? Calcium 06/03/2013 9.0  8.5 - 10.1 MG/DL Final   ??? Bilirubin, total 06/03/2013 0.3  0.2 - 1.0 MG/DL Final   ??? ALT 06/03/2013 23  12 - 78 U/L Final   ??? AST 06/03/2013 26  15 - 37 U/L Final   ??? Alk. phosphatase 06/03/2013 66  45 - 117 U/L Final   ??? Protein, total 06/03/2013 6.5  6.4 - 8.2 g/dL Final   ??? Albumin 06/03/2013 3.1* 3.5 - 5.0 g/dL Final   ??? Globulin 06/03/2013 3.4  2.0 - 4.0 g/dL Final   ??? A-G Ratio 06/03/2013 0.9* 1.1 - 2.2   Final   ??? Glucose 06/03/2013 91  65 - 100 MG/DL Final   ??? WBC 06/03/2013 4.3  3.6 - 11.0 K/uL Final   ??? RBC 06/03/2013 4.75  3.80 - 5.20 M/uL Final   ??? HGB 06/03/2013 13.8  11.5 - 16.0 g/dL Final   ??? HCT 06/03/2013 42.8  35.0 - 47.0 % Final   ??? MCV 06/03/2013 90.1  80.0 - 99.0 FL Final   ??? MCH 06/03/2013 29.1  26.0 - 34.0 PG Final   ??? MCHC 06/03/2013 32.2  30.0 - 36.5 g/dL Final   ??? RDW 06/03/2013 12.3  11.5 - 14.5 % Final   ??? PLATELET 06/03/2013 256  150 - 400 K/uL Final   ??? TSH 06/03/2013 0.68  0.36 - 3.74 uIU/mL  Final   ??? LIPID PROFILE 06/03/2013       Final   ??? Cholesterol, total 06/03/2013 152  <200 MG/DL Final   ??? Triglyceride 06/03/2013 135  <150 MG/DL Final   ??? HDL Cholesterol 06/03/2013 68   Final   ??? LDL, calculated 06/03/2013 57  0 - 100 MG/DL Final   ??? VLDL, calculated 06/03/2013 27   Final   ??? CHOL/HDL Ratio 06/03/2013 2.2  0 - 5.0   Final   ??? Color 06/03/2013 YELLOW/STRAW   Final   ??? Appearance 06/03/2013 CLEAR  CLEAR   Final   ??? Specific gravity 06/03/2013 1.010  1.003 - 1.030   Final   ??? pH (UA) 06/03/2013 7.5  5.0 - 8.0   Final   ??? Protein 06/03/2013 NEGATIVE   NEG mg/dL Final   ??? Glucose 06/03/2013 NEGATIVE   NEG mg/dL Final   ???  Ketone 06/03/2013 NEGATIVE   NEG mg/dL Final   ??? Bilirubin 06/03/2013 NEGATIVE   NEG   Final   ??? Blood 06/03/2013 NEGATIVE   NEG   Final   ??? Urobilinogen 06/03/2013 1.0  0.2 - 1.0 EU/dL Final   ??? Nitrites 06/03/2013 NEGATIVE   NEG   Final   ??? Leukocyte Esterase 06/03/2013 TRACE* NEG   Final   ??? WBC 06/03/2013 PENDING   Incomplete   ??? RBC 06/03/2013 PENDING   Incomplete   ??? Epithelial cells 06/03/2013 PENDING   Incomplete   ??? Bacteria 06/03/2013 PENDING   Incomplete   ??? AMPHETAMINE 06/03/2013 NEGATIVE   NEG   Final   ??? BARBITURATES 06/03/2013 NEGATIVE   NEG   Final   ??? BENZODIAZEPINE 06/03/2013 NEGATIVE   NEG   Final   ??? COCAINE 06/03/2013 NEGATIVE   NEG   Final   ??? METHADONE 06/03/2013 NEGATIVE   NEG   Final   ??? OPIATES 06/03/2013 NEGATIVE   NEG   Final   ??? PCP(PHENCYCLIDINE) 06/03/2013 NEGATIVE   NEG   Final   ??? THC (TH-CANNABINOL) 06/03/2013 NEGATIVE   NEG   Final   ??? DRUG SCRN COMMENT 06/03/2013 (NOTE)   Final        RADIOLOGY REPORTS:    Results from Hospital Encounter encounter on 11/28/10   XR CHEST PA LAT   Narrative **Final Report**      ICD Codes / Adm.Diagnosis: 786.2  790.22 / Cough    Examination:  CR CHEST PA AND LATERAL  - [id number] - Nov 28 2010  3:25PM  Accession No:  [id number]  Reason:  cough      REPORT:  INDICATION:  cough    COMPARISON: 12/16/09    FINDINGS: PA and  lateral views of the chest demonstrate a stable   cardiomediastinal silhouette and clear lungs bilaterally. The visualized   osseous structures are unremarkable.       IMPRESSION: No acute process           Signing/Reading Doctor: Vanna Scotland [phone])    Approved: Vanna Scotland [phone])  11/28/2010                                  No results found.           MEDICATIONS       ALL MEDICATIONS  Current Facility-Administered Medications   Medication Dose Route Frequency   ??? atorvastatin (LIPITOR) tablet 40 mg  40 mg Oral QHS   ??? valsartan (DIOVAN) tablet 160 mg  160 mg Oral DAILY   ??? metoprolol succinate (TOPROL-XL) XL tablet 25 mg  25 mg Oral DAILY   ??? aspirin chewable tablet 81 mg  81 mg Oral DAILY   ??? calcium carbonate (TUMS) chewable tablet 200 mg [elemental]  200 mg Oral TID WITH MEALS   ??? pantoprazole (PROTONIX) tablet 40 mg  40 mg Oral ACB   ??? clonazePAM (KlonoPIN) tablet 0.25 mg  0.25 mg Oral TID   ??? mirtazapine (REMERON) tablet 15 mg  15 mg Oral QHS   ??? [START ON 06/04/2013] cyanocobalamin (VITAMIN B12) tablet 500 mcg  500 mcg Oral DAILY   ??? loratadine (CLARITIN) tablet 10 mg  10 mg Oral DAILY PRN   ??? OLANZapine (ZyPREXA) tablet 2.5 mg  2.5 mg Oral Q6H PRN   ??? ziprasidone (GEODON) 10 mg in sterile water (preservative free) injection  10  mg IntraMUSCular BID PRN   ??? benztropine (COGENTIN) tablet 1 mg  1 mg Oral BID PRN   ??? benztropine (COGENTIN) injection 1 mg  1 mg IntraMUSCular Q12H PRN   ??? LORazepam (ATIVAN) injection 0.5 mg  0.5 mg IntraMUSCular Q4H PRN   ??? LORazepam (ATIVAN) tablet 0.5 mg  0.5 mg Oral Q4H PRN   ??? zolpidem (AMBIEN) tablet 5 mg  5 mg Oral QHS PRN   ??? acetaminophen (TYLENOL) tablet 650 mg  650 mg Oral Q4H PRN   ??? ibuprofen (MOTRIN) tablet 400 mg  400 mg Oral Q8H PRN   ??? magnesium hydroxide (MILK OF MAGNESIA) oral suspension 30 mL  30 mL Oral DAILY PRN   ??? nicotine (NICODERM CQ) 21 mg/24 hr patch 1 Patch  1 Patch TransDERmal DAILY PRN      SCHEDULED MEDICATIONS  Current Facility-Administered  Medications   Medication Dose Route Frequency   ??? atorvastatin (LIPITOR) tablet 40 mg  40 mg Oral QHS   ??? valsartan (DIOVAN) tablet 160 mg  160 mg Oral DAILY   ??? metoprolol succinate (TOPROL-XL) XL tablet 25 mg  25 mg Oral DAILY   ??? aspirin chewable tablet 81 mg  81 mg Oral DAILY   ??? calcium carbonate (TUMS) chewable tablet 200 mg [elemental]  200 mg Oral TID WITH MEALS   ??? pantoprazole (PROTONIX) tablet 40 mg  40 mg Oral ACB   ??? clonazePAM (KlonoPIN) tablet 0.25 mg  0.25 mg Oral TID   ??? mirtazapine (REMERON) tablet 15 mg  15 mg Oral QHS   ??? [START ON 06/04/2013] cyanocobalamin (VITAMIN B12) tablet 500 mcg  500 mcg Oral DAILY                ASSESSMENT & PLAN        The patient Gina Holloway is a 79 y.o.  female who presents at this time for treatment of the following diagnoses:  Patient Active Hospital Problem List:   Mixed anxiety and depressive disorder (11/25/2012)    Assessment: severely depressed, anxious and shaky.     Plan: Resume Remeron and start low dose clonazepam   Hx of medication noncompliance (10/16/2012)    Assessment: chronic    Plan: monitor closely. Educate   HTN (hypertension) (03/29/2008)    Assessment: stable    Plan: continue home medications.    GERD (gastroesophageal reflux disease) (03/29/2008)    Assessment: stable    Plan: continue home medications   Thyroid goiter (03/29/2008)    Assessment: stable    Plan: continue to monitor   Osteoporosis (03/29/2008)    Assessment: stable    Plan:  Continue home medications   Mixed hyperlipidemia (05/23/2010)    Assessment: stable    Plan: continue home medications        In summary, Gina Holloway presents with a severe exacerbation of the principal diagnosis, Mixed anxiety and depressive disorder.           I agree with decision to admit patient. I have spoken to [facility] psychiatric assessor/ED staff regarding the nature of patients's admission at this time.    A coordinated, multidisplinary treatment team round was conducted with the patient; that  includes the nurse, unit pharmcist, Catering manager all present.     The following regarding medications was addressed during rounds with patient:   the risks and benefits of the proposed medication. The patient was given the opportunity to ask questions. Informed consent given to the use of  the above medications.     I will continue to adjust psychiatric and non-psychiatric medications (see above "medication" section and orders section for details) as deemed appropriate & based upon diagnoses and response to treatment.     I have reviewed admission (and previous/old) labs and medical tests in the EHR and or transferring hospital documents. I will continue to order blood tests/labs and diagnostic tests as deemed appropriate and review results as they become available (see orders for details).    I have reviewed old psychiatric and medical records available in the EHR. I Will order additional psychiatric records from other institutions to further elucidate the nature of patient's psychopathology and review once available.    I will gather additional collateral information from friends, family and o/p treatment team to further elucidate the nature of patient's psychopathology and baselline level of psychiatric functioning.    While on the unit KASSADY LABOY will be provided with individual, milieu, occupational, group, and substance abuse therapies to address target symptoms as deemed appropriate for the individual patient.      ESTIMATED LENGTH OF STAY:   5-7 days days       STRENGTHS:  Access to housing/residential stability and Interpersonal/supportive relationships (family, friends, peers)                      SIGNED:    Johnanna Schneiders, MD  06/03/2013

## 2013-06-04 MED FILL — CLONAZEPAM 0.5 MG TAB: 0.5 mg | ORAL | Qty: 1

## 2013-06-04 MED FILL — CALCIUM CARBONATE 200 MG (500 MG) CHEWABLE TAB: 200 mg calcium (500 mg) | ORAL | Qty: 1

## 2013-06-04 MED FILL — VALSARTAN 80 MG TAB: 80 mg | ORAL | Qty: 2

## 2013-06-04 MED FILL — ATORVASTATIN 40 MG TAB: 40 mg | ORAL | Qty: 1

## 2013-06-04 MED FILL — VITAMIN B-12 500 MCG TABLET: 500 mcg | ORAL | Qty: 1

## 2013-06-04 MED FILL — MIRTAZAPINE 15 MG TAB: 15 mg | ORAL | Qty: 1

## 2013-06-04 MED FILL — TOPROL XL 50 MG TABLET,EXTENDED RELEASE: 50 mg | ORAL | Qty: 1

## 2013-06-04 MED FILL — CHILDREN'S ASPIRIN 81 MG CHEWABLE TABLET: 81 mg | ORAL | Qty: 1

## 2013-06-04 MED FILL — PROTONIX 40 MG TABLET,DELAYED RELEASE: 40 mg | ORAL | Qty: 1

## 2013-06-04 NOTE — Behavioral Health Treatment Team (Signed)
Pt visible on the unit, attended select groups, meals and meds compliant.  Pt observed sitting quiet in day room, no interaction with peers.  Pt offered no self disclosures, affect flat and sad, depressed mood.  Pt remain on Q 15 min checks for safety, will offer support daily.

## 2013-06-04 NOTE — Behavioral Health Treatment Team (Cosign Needed)
GROUP THERAPY PROGRESS NOTE    The patient Gina PaceShirley H Burright a 79 y.o. female is participating in Coping Skills Group.     Group time: 45 minutes    Personal goal for participation: To watch relaxation DVD-[employer]    Goal orientation:  relaxation    Group therapy participation: active    Therapeutic interventions reviewed and discussed: benefits of watching DVD/other relaxation techniques    Impression of participation:  The patient was attentive.    Adria DevonBeverly S Baker  06/04/2013  1:44 PM

## 2013-06-04 NOTE — Behavioral Health Treatment Team (Cosign Needed)
GROUP THERAPY PROGRESS NOTE    Gina Holloway is participating in [employer] Meeting.     Group time: 30 minutes    Personal goal for participation:  Unit orientation    Goal orientation: Community    Group therapy participation: active    Therapeutic interventions reviewed and discussed: Yes    Impression of participation: good

## 2013-06-04 NOTE — Behavioral Health Treatment Team (Cosign Needed)
REFLECTIONS GROUP THERAPY PROGRESS NOTE    The patient Gina Holloway is participating in Reflections Group Therapy.     Group time: 30 minutes    Personal goal for participation: Share with group about feelings and concerns throughout the course of the day.    Goal orientation: personal    Group therapy participation: active    Therapeutic interventions reviewed and discussed: Yes    Impression of participation:   Positive input.    Paulette Lanna PocheJ Allen  06/04/2013

## 2013-06-04 NOTE — Behavioral Health Treatment Team (Signed)
Patient resting quietly  During the night  Eyes closed and respirations visible during  Fifteen minute checks,voiced no  Complain of pain or discomfort.Patient slept 8 hours.

## 2013-06-04 NOTE — Behavioral Health Treatment Team (Signed)
The patient Gina Holloway a 79 y.o. female has been visible on the unit.  She denies suicidal and homicidal Ideation and verbally contracted for safety. Complaint with meals and medication. Patient denies audio / visual hallucination. Patient does not appear to be responding to internal stimuli. Affect is flat. Patient interact well with peers. Patient follows redirections. Will continue to monitor.

## 2013-06-04 NOTE — Behavioral Health Treatment Team (Signed)
PSYCHIATRIC PROGRESS NOTE         Patient Name  Gina Holloway   Date of Birth 1934-05-18   CSN [id number]   Medical Record Number  [id number]      Age  79 y.o.   PCP Paschal Doppheryl D Jordan-Sayles, MD   Admit date:  06/02/2013    Room Number  322/01  @ [facility]   Date of Service  06/04/2013            PSYCHOTHERAPY SESSION NOTE:  Length of psychotherapy session: 20 minutes  Main condition/diagnosis treated during session today: medication compliance    Interpersonal relationship issues and psychodynamic conflicts explored. Supportive psychotherapy provided in regards to various ongoing psychosocial stressors (including some of the following):   pre-admission and current problems   Housing issues   Occupational issues   Academic issues   Legal issues   Medical issues   Interpersonal conflicts   Stress of hospitalization              Worked on issues of denial & effects of substance dependency/use  Cognitive/Behavioral therapy provided  Reality-Oriented psychotherapy provided     Overall, patient is not progressing.    An extended energy and skill set was needed to engage pt in psychotherapy due to some of the following: resistiveness, complexity, negativity, confrontational nature, hostile behaviors, and/or severe abnormalities in thought processes/psychosis resulting in the loss of expressive/receptive language communication skills.                      E & M PROGRESS NOTE:         HISTORY       CC/HISTORY OF PRESENT ILLNESS/INTERVAL HISTORY:  (reviewed/updated 06/04/2013).  Pt was seen by me in the outpatient on 06/01/13 and was recommended for inpatient psych admission. Following is the HPI from outpatient initial Evaluation.   History of Present Illness: Gina Holloway is a 79 y.o. Divorced, AA female who presents with symptoms of depression, anxiety and cognitive decline. Patient was brought by her cousin, who appears very supportive and concerned. She was seen one time by Dr. Syliva OvermanYakov Pushkin and  was referred for speciality care here.   Pt reports that no medications has worked for her depression, anxiety and nerves in last 3-4 years. She was initially seen by Dr. Leilani MerlEttigi in July 2012 after couple of ED visits for depression/anxiety. She was started on Celexa and Remeron, which she only took for few days and stopped and did not FU with Dr. Leilani MerlEttigi. She continued some Rx on and off with her PCP. Mostly non compliant. She was [facility] in November x 2013 and was d/c after 11 days on Remeron and low dose lorazepam with outpatient FU with Dr. Franchot Erichsenosenblatt. She FU with him couple of times but was consistently non compliant. Her last visit there was in April 2014. Since then she has received psych medications from her PCP, Dr. Marcy Sirenheryl Jordan-Sayles. She was tried on Buspar and Xanax as well. Last month saw Dr. Alvina FilbertPushkin and had had multiple ED visits.   Today reports that she is not sleeping well, eating poorly, has lost weight, no energy and stays anxious all the time. She has significant psychomotor retardation. Responses are slow. No gross psychosis or mania. Denies any SI or HI.     MMSE: 19/30  GDS" 14/15    Pt was seen with the team today. She has severe psychomotor retardation and her  body was shaking with anxiety. Reports that she did sleep last night. Not eating well. Reports that she takes Ensure at home.  Gets meals on wheels daily. Denies any AV hallucinations. Appers very scared. All her c/o anxiety is in somatic forms        SIDE EFFECTS: (reviewed/updated 06/04/2013)  None reported or admitted to.     ALLERGIES:(reviewed/updated 06/04/2013)  Allergies   Allergen Reactions   ??? Biaxin [Clarithromycin] Unknown (comments)     Pt not sure what type of reaction   ??? Flagyl [Metronidazole] Unknown (comments)     Pt not sure of what type of reaction      MEDICATIONS PRIOR TO ADMISSION:(reviewed/updated 06/04/2013)  Prescriptions prior to admission   Medication Sig   ??? atorvastatin (LIPITOR) 40 mg  tablet Take 40 mg by mouth daily. Indications: HYPERCHOLESTEROLEMIA   ??? metoprolol succinate (TOPROL-XL) 25 mg XL tablet Take 25 mg by mouth daily. Indications: HYPERTENSION   ??? cyanocobalamin (VITAMIN B-12) 500 mcg tablet Take 500 mcg by mouth daily. Indications: PREVENTION OF VITAMIN B12 DEFICIENCY   ??? loratadine (CLARITIN) 10 mg tablet Take 10 mg by mouth daily as needed for Allergies. Indications: ALLERGIC RHINITIS   ??? aspirin 81 mg Tab Take 81 mg by mouth daily. Indications: MYOCARDIAL INFARCTION PREVENTION      PAST MEDICAL HISTORY: Past medical history from the initial psychiatric evaluation has been reviewed (reviewed/updated 06/04/2013) with no additional updates (I asked patient and no additional past medical history provided). Past Medical History   Diagnosis Date   ??? HTN (hypertension) 03/29/2008   ??? Hypercholesterolemia 03/29/2008   ??? GERD (gastroesophageal reflux disease) 03/29/2008   ??? Thyroid goiter 03/29/2008   ??? Osteoporosis 03/29/2008   ??? Arrhythmia    ??? Murmur    ??? Other chest pain    ??? Reflux esophagitis    ??? Anxiety    ??? Essential hypertension    ??? Depression    ??? Dementia      Past Surgical History   Procedure Laterality Date   ??? Hx cholecystectomy     ??? Hx tubal ligation     ??? Pr colonoscopy,diagnostic  02/18/2006     Dr Park Breed      SOCIAL HISTORY: Social history from the initial psychiatric evaluation has been reviewed (reviewed/updated 06/04/2013) with no additional updates (I asked patient and no additional social history provided). History     Social History   ??? Marital Status: DIVORCED     Spouse Name: N/A     Number of Children: N/A   ??? Years of Education: N/A     Occupational History   ??? Not on file.     Social History Main Topics   ??? Smoking status: Never Smoker    ??? Smokeless tobacco: Never Used   ??? Alcohol Use: No   ??? Drug Use: No   ??? Sexual Activity: No     Other Topics Concern   ??? Not on file     Social History Narrative    ** Merged History Encounter **           FAMILY HISTORY: Family  history from the initial psychiatric evaluation has been reviewed (reviewed/updated 06/04/2013) with no additional updates (I asked patient and no additional family history provided). Family History   Problem Relation Age of Onset   ??? Heart Disease Sister    ??? Arthritis-osteo Sister        REVIEW OF SYSTEMS: (reviewed/updated 06/04/2013)  Appetite:no change  from normal   Sleep: improved   All other Review of Systems:  General ROS: positive for  - fatigue and weight loss  Psychological ROS: positive for - anxiety, depression and memory difficulties  negative for - suicidal ideation  Neurological ROS: positive for - confusion and dizziness         MENTAL STATUS EXAM & VITALS     MENTAL STATUS EXAM (MSE):    MSE FINDINGS ARE WITHIN NORMAL LIMITS (WNL) UNLESS OTHERWISE STATED BELOW.    Orientation oriented to time, place and person   Vital Signs (BP,Pulse, Temp) See below (reviewed 06/04/2013); vital signs are WNL if not listed below.   Gait and Station Stable, no ataxia   Abnormal Muscular Movements/Tone/Behavior No EPS, no Tardive Dyskinesia, no abnormal muscular movements; wnl tone   Relations cooperative   General Appearance:  age appropriate and casually dressed   Language No aphasia or dysarthria   Speech:  normal pitch and normal volume   Thought Processes illogical, wnl rate of thoughts, poor abstract reasoning and computation   Thought Associations circumstantial, illogical and tangential   Thought Content preoccupations   Suicidal Ideations none   Homicidal Ideations none   Mood:  anxious and depressed   Affect:  blunted   Memory recent  adequate   Memory remote:  adequate   Concentration/Attention:  adequate   Fund of Knowledge Average   Insight:  good   Reliability good   Judgment:  fair        VITALS:     Patient Vitals for the past 24 hrs:   Temp Pulse Resp BP   06/04/13 0844 - 74 - 140/60 mmHg   06/04/13 0617 97.5 ??F (36.4 ??C) 72 18 107/52 mmHg            DATA     LABORATORY DATA:(reviewed/updated 06/04/2013)   Recent Results (from the past 24 hour(s))   URINALYSIS W/MICROSCOPIC    Collection Time     06/03/13  4:11 PM       Result Value Ref Range    Color YELLOW/STRAW      Appearance CLEAR  CLEAR      Specific gravity 1.010  1.003 - 1.030      pH (UA) 7.5  5.0 - 8.0      Protein NEGATIVE   NEG mg/dL    Glucose NEGATIVE   NEG mg/dL    Ketone NEGATIVE   NEG mg/dL    Bilirubin NEGATIVE   NEG      Blood NEGATIVE   NEG      Urobilinogen 1.0  0.2 - 1.0 EU/dL    Nitrites NEGATIVE   NEG      Leukocyte Esterase TRACE (*) NEG      WBC 0-4  0 - 4 /hpf    RBC 0-5  0 - 5 /hpf    Epithelial cells FEW  FEW /lpf    Bacteria NEGATIVE   NEG /hpf   DRUG SCREEN, URINE    Collection Time     06/03/13  4:11 PM       Result Value Ref Range    AMPHETAMINE NEGATIVE   NEG      BARBITURATES NEGATIVE   NEG      BENZODIAZEPINE NEGATIVE   NEG      COCAINE NEGATIVE   NEG      METHADONE NEGATIVE   NEG      OPIATES NEGATIVE   NEG  PCP(PHENCYCLIDINE) NEGATIVE   NEG      THC (TH-CANNABINOL) NEGATIVE   NEG      DRUG SCRN COMMENT (NOTE)       No results found for this basename: VALF2, VALAC, VALP, VALPR, CRBMT, DS6, CRBAM, CRBAMP, CARB2, XCRBAM     No results found for this basename: LI, LIH, LITHM      RADIOLOGY REPORTS:(reviewed/updated 06/04/2013)  No results found.         MEDICATIONS     ALL MEDICATIONS:   Current Facility-Administered Medications   Medication Dose Route Frequency   ??? atorvastatin (LIPITOR) tablet 40 mg  40 mg Oral QHS   ??? valsartan (DIOVAN) tablet 160 mg  160 mg Oral DAILY   ??? metoprolol succinate (TOPROL-XL) XL tablet 25 mg  25 mg Oral DAILY   ??? aspirin chewable tablet 81 mg  81 mg Oral DAILY   ??? calcium carbonate (TUMS) chewable tablet 200 mg [elemental]  200 mg Oral TID WITH MEALS   ??? pantoprazole (PROTONIX) tablet 40 mg  40 mg Oral ACB   ??? clonazePAM (KlonoPIN) tablet 0.25 mg  0.25 mg Oral TID   ??? mirtazapine (REMERON) tablet 15 mg  15 mg Oral QHS   ??? cyanocobalamin (VITAMIN B12) tablet 500 mcg  500 mcg Oral DAILY   ???  loratadine (CLARITIN) tablet 10 mg  10 mg Oral DAILY PRN   ??? OLANZapine (ZyPREXA) tablet 2.5 mg  2.5 mg Oral Q6H PRN   ??? ziprasidone (GEODON) 10 mg in sterile water (preservative free) injection  10 mg IntraMUSCular BID PRN   ??? benztropine (COGENTIN) tablet 1 mg  1 mg Oral BID PRN   ??? benztropine (COGENTIN) injection 1 mg  1 mg IntraMUSCular Q12H PRN   ??? LORazepam (ATIVAN) injection 0.5 mg  0.5 mg IntraMUSCular Q4H PRN   ??? LORazepam (ATIVAN) tablet 0.5 mg  0.5 mg Oral Q4H PRN   ??? zolpidem (AMBIEN) tablet 5 mg  5 mg Oral QHS PRN   ??? acetaminophen (TYLENOL) tablet 650 mg  650 mg Oral Q4H PRN   ??? ibuprofen (MOTRIN) tablet 400 mg  400 mg Oral Q8H PRN   ??? magnesium hydroxide (MILK OF MAGNESIA) oral suspension 30 mL  30 mL Oral DAILY PRN   ??? nicotine (NICODERM CQ) 21 mg/24 hr patch 1 Patch  1 Patch TransDERmal DAILY PRN      SCHEDULED MEDICATIONS:   Current Facility-Administered Medications   Medication Dose Route Frequency   ??? atorvastatin (LIPITOR) tablet 40 mg  40 mg Oral QHS   ??? valsartan (DIOVAN) tablet 160 mg  160 mg Oral DAILY   ??? metoprolol succinate (TOPROL-XL) XL tablet 25 mg  25 mg Oral DAILY   ??? aspirin chewable tablet 81 mg  81 mg Oral DAILY   ??? calcium carbonate (TUMS) chewable tablet 200 mg [elemental]  200 mg Oral TID WITH MEALS   ??? pantoprazole (PROTONIX) tablet 40 mg  40 mg Oral ACB   ??? clonazePAM (KlonoPIN) tablet 0.25 mg  0.25 mg Oral TID   ??? mirtazapine (REMERON) tablet 15 mg  15 mg Oral QHS   ??? cyanocobalamin (VITAMIN B12) tablet 500 mcg  500 mcg Oral DAILY            ASSESSMENT & PLAN     The patient BRAILEIGH LANDENBERGER is a 79 y.o. female who presents at this time for treatment of the following diagnoses:  Patient Active Hospital Problem List:  Mixed anxiety and depressive disorder (11/25/2012)  Assessment: severely depressed, anxious and shaky.   Plan: Resume Remeron and start low dose clonazepam  Hx of medication noncompliance (10/16/2012)  Assessment: chronic  Plan: monitor closely. Educate  HTN  (hypertension) (03/29/2008)  Assessment: stable  Plan: continue home medications.   GERD (gastroesophageal reflux disease) (03/29/2008)  Assessment: stable  Plan: continue home medications  Thyroid goiter (03/29/2008)  Assessment: stable  Plan: continue to monitor  Osteoporosis (03/29/2008)  Assessment: stable  Plan: Continue home medications  Mixed hyperlipidemia (05/23/2010)  Assessment: stable  Plan: continue home medications      Poor PO intake: Dietary consult. Add Ensure to meals.         In summary, Gina Holloway presents with a severe exacerbation of the principal diagnosis, Mixed anxiety and depressive disorder, which is /not improving.           A coordinated, multidisplinary treatment team round was conducted with the patient; that includes the nurse, psychiatric unit pharmcist, social worker and Clinical research associatewriter all present.     The following regarding medications was addressed during rounds with patient:   the risks and benefits of the proposed medication. The patient was given the opportunity to ask questions. Informed consent given to the use of the above medications. Will continue to adjust psychiatric and non-psychiatric medications (see above "medication" section and orders section for details) as deemed appropriate & based upon diagnoses and response to treatment.     I will continue to order blood tests/labs and diagnostic tests as deemed appropriate and review results as they become available (see orders for details and above listed lab/test results).    I will order psychiatric records from previous psych hospitals to further elucidate the nature of patient's psychopathology and review once available.    I will gather additional collateral information from friends, family and o/p treatment team to further elucidate the nature of patient's psychopathology and baselline level of psychiatric functioning.    Complete current electronic health record for patient has been reviewed today including consultant  notes, ancillary staff notes, nurses and psychiatric tech notes    I will continue to provide individual, milieu, occupational, group, and substance abuse therapies to address target symptoms as deemed appropriate for the individual patient.      EXPECTED DISCHARGE DATE/DAY: TBD     DISPOSITION: Home       Signed By:   Adela LankSultan A Rooney Swails, MD  06/04/2013

## 2013-06-05 MED FILL — CALCIUM CARBONATE 200 MG (500 MG) CHEWABLE TAB: 200 mg calcium (500 mg) | ORAL | Qty: 1

## 2013-06-05 MED FILL — CLONAZEPAM 0.5 MG TAB: 0.5 mg | ORAL | Qty: 1

## 2013-06-05 MED FILL — ATORVASTATIN 40 MG TAB: 40 mg | ORAL | Qty: 1

## 2013-06-05 MED FILL — MIRTAZAPINE 15 MG TAB: 15 mg | ORAL | Qty: 1

## 2013-06-05 MED FILL — VALSARTAN 80 MG TAB: 80 mg | ORAL | Qty: 2

## 2013-06-05 MED FILL — TOPROL XL 50 MG TABLET,EXTENDED RELEASE: 50 mg | ORAL | Qty: 1

## 2013-06-05 MED FILL — VITAMIN B-12 500 MCG TABLET: 500 mcg | ORAL | Qty: 1

## 2013-06-05 MED FILL — CHILDREN'S ASPIRIN 81 MG CHEWABLE TABLET: 81 mg | ORAL | Qty: 1

## 2013-06-05 MED FILL — PROTONIX 40 MG TABLET,DELAYED RELEASE: 40 mg | ORAL | Qty: 1

## 2013-06-05 NOTE — Behavioral Health Treatment Team (Signed)
Pt has been out on the unit. Interactive with peers. Displays flat affect and depressed mood. Did attend group. Was an active participant. Maintained on q1015min checks for safety.

## 2013-06-05 NOTE — Behavioral Health Treatment Team (Cosign Needed)
Pt is slow moving and to respond.  Pt was encouraged to get dress today, however she declined.  Pt ate 35% of both meals breakfast and lunch.  Pt did not express any concerns or compliant at this time.  Pt will continue to be monitored for safety.

## 2013-06-05 NOTE — Progress Notes (Signed)
Recommendations: cont supplements at mealtimes.  Cont Remeron for appetite stimulation.  Consider vit D supplement (D3 gelcap, 2000 IUs) along with calcium.  Pt consuming about 50% of meals.  PMH remarkable for dementia, HTN, hyperlipidemia, Gerd, goiter, osteoporosis, non-compliance.  A1c noted 6.0, increased from previous reading  Ht: 5'3"  Wt: 130 lb  BMI 23.03 kg/(m^2) c/w normal weight  Est energy needs: 1520 kcal, 48 g protein, 1 mL/kcal fluids  RD following for po intake and supplement use.

## 2013-06-05 NOTE — Behavioral Health Treatment Team (Signed)
Patient resting quietly  During the night  Eyes closed and respirations visible during  Fifteen minute checks,voiced no  Complain of pain or discomfort.Patient 8 hours.

## 2013-06-05 NOTE — Behavioral Health Treatment Team (Cosign Needed)
GROUP THERAPY PROGRESS NOTE    The patient Gina PaceShirley H Armon a 79 y.o. female is participating in Creative Expression Group.     Group time: 1 hour    Personal goal for participation: To concentrate on selected task    Goal orientation: social    Group therapy participation: active    Therapeutic interventions reviewed and discussed: Crafts, games, music    Impression of participation: The patient was attentive.    Audelia HivesBeverly S Baker  06/05/2013  4:58 PM

## 2013-06-05 NOTE — Behavioral Health Treatment Team (Cosign Needed)
GROUP THERAPY PROGRESS NOTE    The patient Gina Holloway a 79 y.o. female is participating in Coping Skills Group.     Group time: 45 minutes    Personal goal for participation: To learn about resilience    Goal orientation:  personal    Group therapy participation: active    Therapeutic interventions reviewed and discussed: handout    Impression of participation:  The patient was attentive.    Adria DevonBeverly S Baker  06/05/2013  1:20 PM

## 2013-06-05 NOTE — Behavioral Health Treatment Team (Cosign Needed)
GROUP THERAPY PROGRESS NOTE    Gina Holloway is participating in [employer] Meeting.     Group time: 30 minutes    Personal goal for participation:  Unit orientation    Goal orientation: Community    Group therapy participation: active    Therapeutic interventions reviewed and discussed: Yes    Impression of participation:good

## 2013-06-05 NOTE — Behavioral Health Treatment Team (Signed)
PSYCHIATRIC PROGRESS NOTE         Patient Name  Gina Holloway   Date of Birth 1934-03-28   CSN [id number]   Medical Record Number  [id number]      Age  79 y.o.   PCP Paschal Dopp, MD   Admit date:  06/02/2013    Room Number  322/01  @ [facility]   Date of Service  06/05/2013            PSYCHOTHERAPY SESSION NOTE:  Length of psychotherapy session: 20 minutes  Main condition/diagnosis treated during session today: medication compliance    Interpersonal relationship issues and psychodynamic conflicts explored. Supportive psychotherapy provided in regards to various ongoing psychosocial stressors (including some of the following):   pre-admission and current problems   Housing issues   Occupational issues   Academic issues   Legal issues   Medical issues   Interpersonal conflicts   Stress of hospitalization              Worked on issues of denial & effects of substance dependency/use  Cognitive/Behavioral therapy provided  Reality-Oriented psychotherapy provided     Overall, patient is not progressing.    An extended energy and skill set was needed to engage pt in psychotherapy due to some of the following: resistiveness, complexity, negativity, confrontational nature, hostile behaviors, and/or severe abnormalities in thought processes/psychosis resulting in the loss of expressive/receptive language communication skills.                      E & M PROGRESS NOTE:         HISTORY       CC/HISTORY OF PRESENT ILLNESS/INTERVAL HISTORY:  (reviewed/updated 06/05/2013).  Pt was seen by me in the outpatient on 06/01/13 and was recommended for inpatient psych admission. Following is the HPI from outpatient initial Evaluation.   History of Present Illness: Gina Holloway is a 79 y.o. Divorced, AA female who presents with symptoms of depression, anxiety and cognitive decline. Patient was brought by her cousin, who appears very supportive and concerned. She was seen one time by Dr. Syliva Overman and was  referred for speciality care here.   Pt reports that no medications has worked for her depression, anxiety and nerves in last 3-4 years. She was initially seen by Dr. Leilani Merl in July 2012 after couple of ED visits for depression/anxiety. She was started on Celexa and Remeron, which she only took for few days and stopped and did not FU with Dr. Leilani Merl. She continued some Rx on and off with her PCP. Mostly non compliant. She was admitted to [facility] inpatient psych in November x 2013 and was d/c after 11 days on Remeron and low dose lorazepam with outpatient FU with Dr. Franchot Erichsen. She FU with him couple of times but was consistently non compliant. Her last visit there was in April 2014. Since then she has received psych medications from her PCP, Dr. Marcy Siren. She was tried on Buspar and Xanax as well. Last month saw Dr. Alvina Filbert and had had multiple ED visits.   Today reports that she is not sleeping well, eating poorly, has lost weight, no energy and stays anxious all the time. She has significant psychomotor retardation. Responses are slow. No gross psychosis or mania. Denies any SI or HI.     MMSE: 19/30  GDS" 14/15    Pt was seen with the team today. She continues to have severe psychomotor retardation.  Anxiety has gone down.  Reports that she had good sleep last night. Not eating well. Reports that she takes Ensure at home.  Gets meals on wheels daily. Denies any AV hallucinations. Denies any SI or HI.         SIDE EFFECTS: (reviewed/updated 06/05/2013)  None reported or admitted to.     ALLERGIES:(reviewed/updated 06/05/2013)  Allergies   Allergen Reactions   ??? Biaxin [Clarithromycin] Unknown (comments)     Pt not sure what type of reaction   ??? Flagyl [Metronidazole] Unknown (comments)     Pt not sure of what type of reaction      MEDICATIONS PRIOR TO ADMISSION:(reviewed/updated 06/05/2013)  Prescriptions prior to admission   Medication Sig   ??? atorvastatin (LIPITOR) 40 mg tablet Take 40 mg by mouth daily.  Indications: HYPERCHOLESTEROLEMIA   ??? metoprolol succinate (TOPROL-XL) 25 mg XL tablet Take 25 mg by mouth daily. Indications: HYPERTENSION   ??? cyanocobalamin (VITAMIN B-12) 500 mcg tablet Take 500 mcg by mouth daily. Indications: PREVENTION OF VITAMIN B12 DEFICIENCY   ??? loratadine (CLARITIN) 10 mg tablet Take 10 mg by mouth daily as needed for Allergies. Indications: ALLERGIC RHINITIS   ??? aspirin 81 mg Tab Take 81 mg by mouth daily. Indications: MYOCARDIAL INFARCTION PREVENTION      PAST MEDICAL HISTORY: Past medical history from the initial psychiatric evaluation has been reviewed (reviewed/updated 06/05/2013) with no additional updates (I asked patient and no additional past medical history provided).   Past Medical History   Diagnosis Date   ??? HTN (hypertension) 03/29/2008   ??? Hypercholesterolemia 03/29/2008   ??? GERD (gastroesophageal reflux disease) 03/29/2008   ??? Thyroid goiter 03/29/2008   ??? Osteoporosis 03/29/2008   ??? Arrhythmia    ??? Murmur    ??? Other chest pain    ??? Reflux esophagitis    ??? Anxiety    ??? Essential hypertension    ??? Depression    ??? Dementia      Past Surgical History   Procedure Laterality Date   ??? Hx cholecystectomy     ??? Hx tubal ligation     ??? Pr colonoscopy,diagnostic  02/18/2006     Dr Park Breed      SOCIAL HISTORY: Social history from the initial psychiatric evaluation has been reviewed (reviewed/updated 06/05/2013) with no additional updates (I asked patient and no additional social history provided).   History     Social History   ??? Marital Status: DIVORCED     Spouse Name: N/A     Number of Children: N/A   ??? Years of Education: N/A     Occupational History   ??? Not on file.     Social History Main Topics   ??? Smoking status: Never Smoker    ??? Smokeless tobacco: Never Used   ??? Alcohol Use: No   ??? Drug Use: No   ??? Sexual Activity: No     Other Topics Concern   ??? Not on file     Social History Narrative    ** Merged History Encounter **           FAMILY HISTORY: Family history from the initial  psychiatric evaluation has been reviewed (reviewed/updated 06/05/2013) with no additional updates (I asked patient and no additional family history provided).   Family History   Problem Relation Age of Onset   ??? Heart Disease Sister    ??? Arthritis-osteo Sister        REVIEW OF SYSTEMS: (reviewed/updated 06/05/2013)  Appetite:no change from normal   Sleep: improved   All other Review of Systems:  General ROS: positive for  - fatigue and weight loss  Psychological ROS: positive for - anxiety, depression and memory difficulties  negative for - suicidal ideation  Neurological ROS: positive for - confusion and dizziness         MENTAL STATUS EXAM & VITALS     MENTAL STATUS EXAM (MSE):    MSE FINDINGS ARE WITHIN NORMAL LIMITS (WNL) UNLESS OTHERWISE STATED BELOW.    Orientation oriented to time, place and person   Vital Signs (BP,Pulse, Temp) See below (reviewed 06/05/2013); vital signs are WNL if not listed below.   Gait and Station Stable, no ataxia   Abnormal Muscular Movements/Tone/Behavior No EPS, no Tardive Dyskinesia, no abnormal muscular movements; wnl tone   Relations cooperative   General Appearance:  age appropriate and casually dressed   Language No aphasia or dysarthria   Speech:  normal pitch and normal volume   Thought Processes illogical, wnl rate of thoughts, poor abstract reasoning and computation   Thought Associations circumstantial, illogical and tangential   Thought Content preoccupations   Suicidal Ideations none   Homicidal Ideations none   Mood:  anxious and depressed   Affect:  blunted   Memory recent  adequate   Memory remote:  adequate   Concentration/Attention:  adequate   Fund of Knowledge Average   Insight:  good   Reliability good   Judgment:  fair        VITALS:     Patient Vitals for the past 24 hrs:   Temp Pulse Resp BP   06/05/13 0631 95.2 ??F (35.1 ??C) 70 16 144/59 mmHg   06/04/13 1642 - 67 - 116/53 mmHg            DATA     LABORATORY DATA:(reviewed/updated 06/05/2013)  No results found for this  or any previous visit (from the past 24 hour(s)).  No results found for this basename: VALF2,  VALAC,  VALP,  VALPR,  CRBMT,  DS6,  CRBAM,  CRBAMP,  CARB2,  XCRBAM     No results found for this basename: LI,  LIH,  LITHM      RADIOLOGY REPORTS:(reviewed/updated 06/05/2013)  No results found.         MEDICATIONS     ALL MEDICATIONS:   Current Facility-Administered Medications   Medication Dose Route Frequency   ??? atorvastatin (LIPITOR) tablet 40 mg  40 mg Oral QHS   ??? valsartan (DIOVAN) tablet 160 mg  160 mg Oral DAILY   ??? metoprolol succinate (TOPROL-XL) XL tablet 25 mg  25 mg Oral DAILY   ??? aspirin chewable tablet 81 mg  81 mg Oral DAILY   ??? calcium carbonate (TUMS) chewable tablet 200 mg [elemental]  200 mg Oral TID WITH MEALS   ??? pantoprazole (PROTONIX) tablet 40 mg  40 mg Oral ACB   ??? clonazePAM (KlonoPIN) tablet 0.25 mg  0.25 mg Oral TID   ??? mirtazapine (REMERON) tablet 15 mg  15 mg Oral QHS   ??? cyanocobalamin (VITAMIN B12) tablet 500 mcg  500 mcg Oral DAILY   ??? loratadine (CLARITIN) tablet 10 mg  10 mg Oral DAILY PRN   ??? OLANZapine (ZyPREXA) tablet 2.5 mg  2.5 mg Oral Q6H PRN   ??? ziprasidone (GEODON) 10 mg in sterile water (preservative free) injection  10 mg IntraMUSCular BID PRN   ??? benztropine (COGENTIN) tablet 1 mg  1 mg Oral BID  PRN   ??? benztropine (COGENTIN) injection 1 mg  1 mg IntraMUSCular Q12H PRN   ??? LORazepam (ATIVAN) injection 0.5 mg  0.5 mg IntraMUSCular Q4H PRN   ??? LORazepam (ATIVAN) tablet 0.5 mg  0.5 mg Oral Q4H PRN   ??? zolpidem (AMBIEN) tablet 5 mg  5 mg Oral QHS PRN   ??? acetaminophen (TYLENOL) tablet 650 mg  650 mg Oral Q4H PRN   ??? ibuprofen (MOTRIN) tablet 400 mg  400 mg Oral Q8H PRN   ??? magnesium hydroxide (MILK OF MAGNESIA) oral suspension 30 mL  30 mL Oral DAILY PRN   ??? nicotine (NICODERM CQ) 21 mg/24 hr patch 1 Patch  1 Patch TransDERmal DAILY PRN      SCHEDULED MEDICATIONS:   Current Facility-Administered Medications   Medication Dose Route Frequency   ??? atorvastatin (LIPITOR) tablet 40  mg  40 mg Oral QHS   ??? valsartan (DIOVAN) tablet 160 mg  160 mg Oral DAILY   ??? metoprolol succinate (TOPROL-XL) XL tablet 25 mg  25 mg Oral DAILY   ??? aspirin chewable tablet 81 mg  81 mg Oral DAILY   ??? calcium carbonate (TUMS) chewable tablet 200 mg [elemental]  200 mg Oral TID WITH MEALS   ??? pantoprazole (PROTONIX) tablet 40 mg  40 mg Oral ACB   ??? clonazePAM (KlonoPIN) tablet 0.25 mg  0.25 mg Oral TID   ??? mirtazapine (REMERON) tablet 15 mg  15 mg Oral QHS   ??? cyanocobalamin (VITAMIN B12) tablet 500 mcg  500 mcg Oral DAILY            ASSESSMENT & PLAN     The patient Gina Holloway is a 79 y.o. female who presents at this time for treatment of the following diagnoses:  Patient Active Hospital Problem List:  Mixed anxiety and depressive disorder (11/25/2012)  Assessment: severely depressed, anxious and shaky.   Plan: Continue Remeron and  clonazepam  Hx of medication noncompliance (10/16/2012)  Assessment: chronic  Plan: monitor closely. Educate  HTN (hypertension) (03/29/2008)  Assessment: stable  Plan: continue home medications.   GERD (gastroesophageal reflux disease) (03/29/2008)  Assessment: stable  Plan: continue home medications  Thyroid goiter (03/29/2008)  Assessment: stable  Plan: continue to monitor  Osteoporosis (03/29/2008)  Assessment: stable  Plan: Continue home medications  Mixed hyperlipidemia (05/23/2010)  Assessment: stable  Plan: continue home medications      Poor PO intake: Dietary consult. Add Ensure to meals.         In summary, Gina Holloway presents with a severe exacerbation of the principal diagnosis, Mixed anxiety and depressive disorder, which is /not improving.           A coordinated, multidisplinary treatment team round was conducted with the patient; that includes the nurse, psychiatric unit pharmcist, social worker and Clinical research associate all present.     The following regarding medications was addressed during rounds with patient:   the risks and benefits of the proposed medication. The  patient was given the opportunity to ask questions. Informed consent given to the use of the above medications. Will continue to adjust psychiatric and non-psychiatric medications (see above "medication" section and orders section for details) as deemed appropriate & based upon diagnoses and response to treatment.     I will continue to order blood tests/labs and diagnostic tests as deemed appropriate and review results as they become available (see orders for details and above listed lab/test results).    I will order psychiatric records  from previous psych hospitals to further elucidate the nature of patient's psychopathology and review once available.    I will gather additional collateral information from friends, family and o/p treatment team to further elucidate the nature of patient's psychopathology and baselline level of psychiatric functioning.    Complete current electronic health record for patient has been reviewed today including consultant notes, ancillary staff notes, nurses and psychiatric tech notes    I will continue to provide individual, milieu, occupational, group, and substance abuse therapies to address target symptoms as deemed appropriate for the individual patient.      EXPECTED DISCHARGE DATE/DAY: TBD     DISPOSITION: Home       Signed By:   Adela LankSultan A Aracelis Ulrey, MD  06/05/2013

## 2013-06-06 MED ORDER — CLONAZEPAM 0.5 MG TAB
0.5 mg | Freq: Two times a day (BID) | ORAL | Status: DC
Start: 2013-06-06 — End: 2013-06-10
  Administered 2013-06-06 – 2013-06-10 (×8): via ORAL

## 2013-06-06 MED FILL — CHILDREN'S ASPIRIN 81 MG CHEWABLE TABLET: 81 mg | ORAL | Qty: 1

## 2013-06-06 MED FILL — CALCIUM CARBONATE 200 MG (500 MG) CHEWABLE TAB: 200 mg calcium (500 mg) | ORAL | Qty: 1

## 2013-06-06 MED FILL — PROTONIX 40 MG TABLET,DELAYED RELEASE: 40 mg | ORAL | Qty: 1

## 2013-06-06 MED FILL — ATORVASTATIN 40 MG TAB: 40 mg | ORAL | Qty: 1

## 2013-06-06 MED FILL — MIRTAZAPINE 15 MG TAB: 15 mg | ORAL | Qty: 1

## 2013-06-06 MED FILL — CLONAZEPAM 0.5 MG TAB: 0.5 mg | ORAL | Qty: 1

## 2013-06-06 MED FILL — VALSARTAN 80 MG TAB: 80 mg | ORAL | Qty: 2

## 2013-06-06 MED FILL — TOPROL XL 50 MG TABLET,EXTENDED RELEASE: 50 mg | ORAL | Qty: 1

## 2013-06-06 MED FILL — ZOLPIDEM 5 MG TAB: 5 mg | ORAL | Qty: 1

## 2013-06-06 MED FILL — VITAMIN B-12 500 MCG TABLET: 500 mcg | ORAL | Qty: 1

## 2013-06-06 NOTE — Behavioral Health Treatment Team (Signed)
Pt rested in bed with eyes closed for approximately 7 hours. No reports made by pt of having any difficulty sleeping while staff conducted rounds. Respirations even and unlabored. No acute distress noted. Was maintained on q15min checks. Will continue to monitor.

## 2013-06-06 NOTE — Behavioral Health Treatment Team (Cosign Needed)
GROUP THERAPY PROGRESS NOTE    Gina Holloway is participating in Community.     Group time: 30 minutes    Personal goal for participation: daily orientation    Goal orientation: personal    Group therapy participation: minimal    Therapeutic interventions reviewed and discussed: yes    Impression of participation: needed prompting

## 2013-06-06 NOTE — Behavioral Health Treatment Team (Signed)
Pt has been isolated to her room most of the time during the shift. Pt was food and med compliant, Pt was appropriate with staff and other pts in the mileu. Pt denied any S/I and H/I at this time,Pt was encouraged to attend all group activities and to discuss any issues or concerns with staff. Staff will also continue to monitor pt with Q -15 checks for safety.

## 2013-06-06 NOTE — Behavioral Health Treatment Team (Signed)
PSYCHIATRIC PROGRESS NOTE         Patient Name  Gina Holloway   Date of Birth [birth year]   CSN [id number]   Medical Record Number  [id number]      Age  79 y.o.   PCP Paschal Dopp, MD   Admit date:  06/02/2013    Room Number  322/01  @ [facility]   Date of Service  06/06/2013            PSYCHOTHERAPY SESSION NOTE:  Length of psychotherapy session: 20 minutes  Main condition/diagnosis treated during session today: medication compliance    Interpersonal relationship issues and psychodynamic conflicts explored. Supportive psychotherapy provided in regards to various ongoing psychosocial stressors (including some of the following):   pre-admission and current problems   Housing issues   Occupational issues   Academic issues   Legal issues   Medical issues   Interpersonal conflicts   Stress of hospitalization              Worked on issues of denial & effects of substance dependency/use  Cognitive/Behavioral therapy provided  Reality-Oriented psychotherapy provided     Overall, patient is not progressing.    An extended energy and skill set was needed to engage pt in psychotherapy due to some of the following: resistiveness, complexity, negativity, confrontational nature, hostile behaviors, and/or severe abnormalities in thought processes/psychosis resulting in the loss of expressive/receptive language communication skills.                      E & M PROGRESS NOTE:         HISTORY       CC/HISTORY OF PRESENT ILLNESS/INTERVAL HISTORY:  (reviewed/updated 06/06/2013).  Pt was seen by me in the outpatient on 06/01/13 and was recommended for inpatient psych admission. Following is the HPI from outpatient initial Evaluation.   History of Present Illness: Gina Holloway is a 79 y.o. Divorced, AA female who presents with symptoms of depression, anxiety and cognitive decline. Patient was brought by her cousin, who appears very supportive and concerned. She was seen one time by Dr. Syliva Overman and was  referred for speciality care here.   Pt reports that no medications has worked for her depression, anxiety and nerves in last 3-4 years. She was initially seen by Dr. Leilani Merl in July 2012 after couple of ED visits for depression/anxiety. She was started on Celexa and Remeron, which she only took for few days and stopped and did not FU with Dr. Leilani Merl. She continued some Rx on and off with her PCP. Mostly non compliant. She was admitted to [facility] inpatient psych in November x 2013 and was d/c after 11 days on Remeron and low dose lorazepam with outpatient FU with Dr. Franchot Erichsen. She FU with him couple of times but was consistently non compliant. Her last visit there was in April 2014. Since then she has received psych medications from her PCP, Dr. Marcy Siren. She was tried on Buspar and Xanax as well. Last month saw Dr. Alvina Filbert and had had multiple ED visits.   Today reports that she is not sleeping well, eating poorly, has lost weight, no energy and stays anxious all the time. She has significant psychomotor retardation. Responses are slow. No gross psychosis or mania. Denies any SI or HI.     MMSE: 19/30  GDS" 14/15    Has been doing well on the unit. Some anxiety last night, made sleeping difficult,  but PRN Ambien was helpful. Still psychomotorially slowed. Denies any AV hallucinations. Denies any SI or HI.         SIDE EFFECTS: (reviewed/updated 06/06/2013)  None reported or admitted to.     ALLERGIES:(reviewed/updated 06/06/2013)  Allergies   Allergen Reactions   ??? Biaxin [Clarithromycin] Unknown (comments)     Pt not sure what type of reaction   ??? Flagyl [Metronidazole] Unknown (comments)     Pt not sure of what type of reaction      MEDICATIONS PRIOR TO ADMISSION:(reviewed/updated 06/06/2013)  Prescriptions prior to admission   Medication Sig   ??? atorvastatin (LIPITOR) 40 mg tablet Take 40 mg by mouth daily. Indications: HYPERCHOLESTEROLEMIA   ??? metoprolol succinate (TOPROL-XL) 25 mg XL tablet Take 25 mg by  mouth daily. Indications: HYPERTENSION   ??? cyanocobalamin (VITAMIN B-12) 500 mcg tablet Take 500 mcg by mouth daily. Indications: PREVENTION OF VITAMIN B12 DEFICIENCY   ??? loratadine (CLARITIN) 10 mg tablet Take 10 mg by mouth daily as needed for Allergies. Indications: ALLERGIC RHINITIS   ??? aspirin 81 mg Tab Take 81 mg by mouth daily. Indications: MYOCARDIAL INFARCTION PREVENTION      PAST MEDICAL HISTORY: Past medical history from the initial psychiatric evaluation has been reviewed (reviewed/updated 06/06/2013) with no additional updates (I asked patient and no additional past medical history provided).   Past Medical History   Diagnosis Date   ??? HTN (hypertension) 03/29/2008   ??? Hypercholesterolemia 03/29/2008   ??? GERD (gastroesophageal reflux disease) 03/29/2008   ??? Thyroid goiter 03/29/2008   ??? Osteoporosis 03/29/2008   ??? Arrhythmia    ??? Murmur    ??? Other chest pain    ??? Reflux esophagitis    ??? Anxiety    ??? Essential hypertension    ??? Depression    ??? Dementia      Past Surgical History   Procedure Laterality Date   ??? Hx cholecystectomy     ??? Hx tubal ligation     ??? Pr colonoscopy,diagnostic  02/18/2006     Dr Park Breed      SOCIAL HISTORY: Social history from the initial psychiatric evaluation has been reviewed (reviewed/updated 06/06/2013) with no additional updates (I asked patient and no additional social history provided).   History     Social History   ??? Marital Status: DIVORCED     Spouse Name: N/A     Number of Children: N/A   ??? Years of Education: N/A     Occupational History   ??? Not on file.     Social History Main Topics   ??? Smoking status: Never Smoker    ??? Smokeless tobacco: Never Used   ??? Alcohol Use: No   ??? Drug Use: No   ??? Sexual Activity: No     Other Topics Concern   ??? Not on file     Social History Narrative    ** Merged History Encounter **           FAMILY HISTORY: Family history from the initial psychiatric evaluation has been reviewed (reviewed/updated 06/06/2013) with no additional updates (I asked  patient and no additional family history provided).   Family History   Problem Relation Age of Onset   ??? Heart Disease Sister    ??? Arthritis-osteo Sister        REVIEW OF SYSTEMS: (reviewed/updated 06/06/2013)  Appetite:no change from normal   Sleep: improved   All other Review of Systems:  General ROS: positive for  -  fatigue and weight loss  Psychological ROS: positive for - anxiety, depression and memory difficulties  negative for - suicidal ideation  Neurological ROS: positive for - confusion and dizziness         MENTAL STATUS EXAM & VITALS     MENTAL STATUS EXAM (MSE):    MSE FINDINGS ARE WITHIN NORMAL LIMITS (WNL) UNLESS OTHERWISE STATED BELOW.    Orientation oriented to time, place and person   Vital Signs (BP,Pulse, Temp) See below (reviewed 06/06/2013); vital signs are WNL if not listed below.   Gait and Station Stable, no ataxia   Abnormal Muscular Movements/Tone/Behavior No EPS, no Tardive Dyskinesia, no abnormal muscular movements; wnl tone   Relations cooperative   General Appearance:  age appropriate and casually dressed   Language No aphasia or dysarthria   Speech:  normal pitch and normal volume   Thought Processes illogical, wnl rate of thoughts, poor abstract reasoning and computation   Thought Associations circumstantial, illogical and tangential   Thought Content preoccupations   Suicidal Ideations none   Homicidal Ideations none   Mood:  anxious and depressed   Affect:  blunted   Memory recent  adequate   Memory remote:  adequate   Concentration/Attention:  adequate   Fund of Knowledge Average   Insight:  good   Reliability good   Judgment:  fair        VITALS:     Patient Vitals for the past 24 hrs:   Temp Pulse Resp BP   06/06/13 0646 97.7 ??F (36.5 ??C) 74 16 139/67 mmHg            DATA     LABORATORY DATA:(reviewed/updated 06/06/2013)  No results found for this or any previous visit (from the past 24 hour(s)).  No results found for this basename: VALF2,  VALAC,  VALP,  VALPR,  CRBMT,  DS6,  CRBAM,   CRBAMP,  CARB2,  XCRBAM     No results found for this basename: LI,  LIH,  LITHM      RADIOLOGY REPORTS:(reviewed/updated 06/06/2013)  No results found.         MEDICATIONS     ALL MEDICATIONS:   Current Facility-Administered Medications   Medication Dose Route Frequency   ??? clonazePAM (KlonoPIN) tablet 0.5 mg  0.5 mg Oral BID   ??? atorvastatin (LIPITOR) tablet 40 mg  40 mg Oral QHS   ??? valsartan (DIOVAN) tablet 160 mg  160 mg Oral DAILY   ??? metoprolol succinate (TOPROL-XL) XL tablet 25 mg  25 mg Oral DAILY   ??? aspirin chewable tablet 81 mg  81 mg Oral DAILY   ??? calcium carbonate (TUMS) chewable tablet 200 mg [elemental]  200 mg Oral TID WITH MEALS   ??? pantoprazole (PROTONIX) tablet 40 mg  40 mg Oral ACB   ??? mirtazapine (REMERON) tablet 15 mg  15 mg Oral QHS   ??? cyanocobalamin (VITAMIN B12) tablet 500 mcg  500 mcg Oral DAILY   ??? loratadine (CLARITIN) tablet 10 mg  10 mg Oral DAILY PRN   ??? OLANZapine (ZyPREXA) tablet 2.5 mg  2.5 mg Oral Q6H PRN   ??? ziprasidone (GEODON) 10 mg in sterile water (preservative free) injection  10 mg IntraMUSCular BID PRN   ??? benztropine (COGENTIN) tablet 1 mg  1 mg Oral BID PRN   ??? benztropine (COGENTIN) injection 1 mg  1 mg IntraMUSCular Q12H PRN   ??? LORazepam (ATIVAN) injection 0.5 mg  0.5 mg IntraMUSCular Q4H PRN   ???  LORazepam (ATIVAN) tablet 0.5 mg  0.5 mg Oral Q4H PRN   ??? zolpidem (AMBIEN) tablet 5 mg  5 mg Oral QHS PRN   ??? acetaminophen (TYLENOL) tablet 650 mg  650 mg Oral Q4H PRN   ??? ibuprofen (MOTRIN) tablet 400 mg  400 mg Oral Q8H PRN   ??? magnesium hydroxide (MILK OF MAGNESIA) oral suspension 30 mL  30 mL Oral DAILY PRN   ??? nicotine (NICODERM CQ) 21 mg/24 hr patch 1 Patch  1 Patch TransDERmal DAILY PRN      SCHEDULED MEDICATIONS:   Current Facility-Administered Medications   Medication Dose Route Frequency   ??? clonazePAM (KlonoPIN) tablet 0.5 mg  0.5 mg Oral BID   ??? atorvastatin (LIPITOR) tablet 40 mg  40 mg Oral QHS   ??? valsartan (DIOVAN) tablet 160 mg  160 mg Oral DAILY   ???  metoprolol succinate (TOPROL-XL) XL tablet 25 mg  25 mg Oral DAILY   ??? aspirin chewable tablet 81 mg  81 mg Oral DAILY   ??? calcium carbonate (TUMS) chewable tablet 200 mg [elemental]  200 mg Oral TID WITH MEALS   ??? pantoprazole (PROTONIX) tablet 40 mg  40 mg Oral ACB   ??? mirtazapine (REMERON) tablet 15 mg  15 mg Oral QHS   ??? cyanocobalamin (VITAMIN B12) tablet 500 mcg  500 mcg Oral DAILY            ASSESSMENT & PLAN     The patient Gina Holloway is a 79 y.o. female who presents at this time for treatment of the following diagnoses:  Patient Active Hospital Problem List:  Mixed anxiety and depressive disorder (11/25/2012)  Assessment: severely depressed, anxious and shaky.   Plan: Continue Remeron and  clonazepam  Hx of medication noncompliance (10/16/2012)  Assessment: chronic  Plan: monitor closely. Educate  HTN (hypertension) (03/29/2008)  Assessment: stable  Plan: continue home medications.   GERD (gastroesophageal reflux disease) (03/29/2008)  Assessment: stable  Plan: continue home medications  Thyroid goiter (03/29/2008)  Assessment: stable  Plan: continue to monitor  Osteoporosis (03/29/2008)  Assessment: stable  Plan: Continue home medications  Mixed hyperlipidemia (05/23/2010)  Assessment: stable  Plan: continue home medications      Poor PO intake: Dietary consult. Add Ensure to meals.         In summary, Gina Holloway presents with a severe exacerbation of the principal diagnosis, Mixed anxiety and depressive disorder, which is /not improving.           A coordinated, multidisplinary treatment team round was conducted with the patient; that includes the nurse, psychiatric unit pharmcist, social worker and Clinical research associatewriter all present.     The following regarding medications was addressed during rounds with patient:   the risks and benefits of the proposed medication. The patient was given the opportunity to ask questions. Informed consent given to the use of the above medications. Will continue to adjust  psychiatric and non-psychiatric medications (see above "medication" section and orders section for details) as deemed appropriate & based upon diagnoses and response to treatment.     I will continue to order blood tests/labs and diagnostic tests as deemed appropriate and review results as they become available (see orders for details and above listed lab/test results).    I will order psychiatric records from previous psych hospitals to further elucidate the nature of patient's psychopathology and review once available.    I will gather additional collateral information from friends, family and o/p treatment team  to further elucidate the nature of patient's psychopathology and baselline level of psychiatric functioning.    Complete current electronic health record for patient has been reviewed today including consultant notes, ancillary staff notes, nurses and psychiatric tech notes    I will continue to provide individual, milieu, occupational, group, and substance abuse therapies to address target symptoms as deemed appropriate for the individual patient.      EXPECTED DISCHARGE DATE/DAY: TBD     DISPOSITION: Home       Signed By:   Narda RutherfordWilliam M Sophia Sperry, MD  06/06/2013

## 2013-06-06 NOTE — Behavioral Health Treatment Team (Cosign Needed)
Patient did not participate in reflection group

## 2013-06-06 NOTE — Behavioral Health Treatment Team (Signed)
Problem: Patient Education: Go to Patient Education Activity  Goal: Patient/Family Education  Outcome: Not Progressing Towards Goal  Pt is mostly isolative from the unit this morning, affect bblunted, mood depressed. Pt is meds/meals compliant, able to come to groups in the unit. Pt has participated in her treatment team meeting today. Will continue to monitor and encourage more interactions in the unit.

## 2013-06-07 MED FILL — CALCIUM CARBONATE 200 MG (500 MG) CHEWABLE TAB: 200 mg calcium (500 mg) | ORAL | Qty: 1

## 2013-06-07 MED FILL — CHILDREN'S ASPIRIN 81 MG CHEWABLE TABLET: 81 mg | ORAL | Qty: 1

## 2013-06-07 MED FILL — VITAMIN B-12 500 MCG TABLET: 500 mcg | ORAL | Qty: 1

## 2013-06-07 MED FILL — TOPROL XL 50 MG TABLET,EXTENDED RELEASE: 50 mg | ORAL | Qty: 1

## 2013-06-07 MED FILL — ATORVASTATIN 40 MG TAB: 40 mg | ORAL | Qty: 1

## 2013-06-07 MED FILL — CLONAZEPAM 0.5 MG TAB: 0.5 mg | ORAL | Qty: 1

## 2013-06-07 MED FILL — VALSARTAN 80 MG TAB: 80 mg | ORAL | Qty: 2

## 2013-06-07 MED FILL — PROTONIX 40 MG TABLET,DELAYED RELEASE: 40 mg | ORAL | Qty: 1

## 2013-06-07 MED FILL — MIRTAZAPINE 15 MG TAB: 15 mg | ORAL | Qty: 1

## 2013-06-07 NOTE — Behavioral Health Treatment Team (Cosign Needed)
GROUP THERAPY PROGRESS NOTE    Gina Holloway is participating in Community.     Group time: 30 minutes    Personal goal for participation: daily orientation    Goal orientation: personal    Group therapy participation: minimal    Therapeutic interventions reviewed and discussed: yes    Impression of participation: needed prompting

## 2013-06-07 NOTE — Behavioral Health Treatment Team (Signed)
PSYCHIATRIC PROGRESS NOTE         Patient Name  Gina Holloway   Date of Birth [birth year]   CSN [id number]   Medical Record Number  [id number]      Age  79 y.o.   PCP Paschal Dopp, MD   Admit date:  06/02/2013    Room Number  322/01  @ [facility]   Date of Service  06/07/2013            PSYCHOTHERAPY SESSION NOTE:  Length of psychotherapy session: 20 minutes  Main condition/diagnosis treated during session today: medication compliance    Interpersonal relationship issues and psychodynamic conflicts explored. Supportive psychotherapy provided in regards to various ongoing psychosocial stressors (including some of the following):   pre-admission and current problems   Housing issues   Occupational issues   Academic issues   Legal issues   Medical issues   Interpersonal conflicts   Stress of hospitalization              Worked on issues of denial & effects of substance dependency/use  Cognitive/Behavioral therapy provided  Reality-Oriented psychotherapy provided     Overall, patient is not progressing.    An extended energy and skill set was needed to engage pt in psychotherapy due to some of the following: resistiveness, complexity, negativity, confrontational nature, hostile behaviors, and/or severe abnormalities in thought processes/psychosis resulting in the loss of expressive/receptive language communication skills.                      E & M PROGRESS NOTE:         HISTORY       CC/HISTORY OF PRESENT ILLNESS/INTERVAL HISTORY:  (reviewed/updated 06/07/2013).  Pt was seen by me in the outpatient on 06/01/13 and was recommended for inpatient psych admission. Following is the HPI from outpatient initial Evaluation.   History of Present Illness: Gina Holloway is a 79 y.o. Divorced, AA female who presents with symptoms of depression, anxiety and cognitive decline. Patient was brought by her cousin, who appears very supportive and concerned. She was seen one time by Dr. Syliva Overman and was  referred for speciality care here.   Pt reports that no medications has worked for her depression, anxiety and nerves in last 3-4 years. She was initially seen by Dr. Leilani Merl in July 2012 after couple of ED visits for depression/anxiety. She was started on Celexa and Remeron, which she only took for few days and stopped and did not FU with Dr. Leilani Merl. She continued some Rx on and off with her PCP. Mostly non compliant. She was admitted to [facility] inpatient psych in November x 2013 and was d/c after 11 days on Remeron and low dose lorazepam with outpatient FU with Dr. Franchot Erichsen. She FU with him couple of times but was consistently non compliant. Her last visit there was in April 2014. Since then she has received psych medications from her PCP, Dr. Marcy Siren. She was tried on Buspar and Xanax as well. Last month saw Dr. Alvina Filbert and had had multiple ED visits.   Today reports that she is not sleeping well, eating poorly, has lost weight, no energy and stays anxious all the time. She has significant psychomotor retardation. Responses are slow. No gross psychosis or mania. Denies any SI or HI.     MMSE: 19/30  GDS" 14/15    Has been doing well on the unit. Staff noted sleep improved, pt also reports  that anxiety somewhat improved. Still psychomotorially slowed. Denies any AV hallucinations. Denies any SI or HI.         SIDE EFFECTS: (reviewed/updated 06/07/2013)  None reported or admitted to.     ALLERGIES:(reviewed/updated 06/07/2013)  Allergies   Allergen Reactions   ??? Biaxin [Clarithromycin] Unknown (comments)     Pt not sure what type of reaction   ??? Flagyl [Metronidazole] Unknown (comments)     Pt not sure of what type of reaction      MEDICATIONS PRIOR TO ADMISSION:(reviewed/updated 06/07/2013)  Prescriptions prior to admission   Medication Sig   ??? atorvastatin (LIPITOR) 40 mg tablet Take 40 mg by mouth daily. Indications: HYPERCHOLESTEROLEMIA   ??? metoprolol succinate (TOPROL-XL) 25 mg XL tablet Take 25 mg by  mouth daily. Indications: HYPERTENSION   ??? cyanocobalamin (VITAMIN B-12) 500 mcg tablet Take 500 mcg by mouth daily. Indications: PREVENTION OF VITAMIN B12 DEFICIENCY   ??? loratadine (CLARITIN) 10 mg tablet Take 10 mg by mouth daily as needed for Allergies. Indications: ALLERGIC RHINITIS   ??? aspirin 81 mg Tab Take 81 mg by mouth daily. Indications: MYOCARDIAL INFARCTION PREVENTION      PAST MEDICAL HISTORY: Past medical history from the initial psychiatric evaluation has been reviewed (reviewed/updated 06/07/2013) with no additional updates (I asked patient and no additional past medical history provided).   Past Medical History   Diagnosis Date   ??? HTN (hypertension) 03/29/2008   ??? Hypercholesterolemia 03/29/2008   ??? GERD (gastroesophageal reflux disease) 03/29/2008   ??? Thyroid goiter 03/29/2008   ??? Osteoporosis 03/29/2008   ??? Arrhythmia    ??? Murmur    ??? Other chest pain    ??? Reflux esophagitis    ??? Anxiety    ??? Essential hypertension    ??? Depression    ??? Dementia      Past Surgical History   Procedure Laterality Date   ??? Hx cholecystectomy     ??? Hx tubal ligation     ??? Pr colonoscopy,diagnostic  02/18/2006     Dr Park BreedKahn      SOCIAL HISTORY: Social history from the initial psychiatric evaluation has been reviewed (reviewed/updated 06/07/2013) with no additional updates (I asked patient and no additional social history provided).   History     Social History   ??? Marital Status: DIVORCED     Spouse Name: N/A     Number of Children: N/A   ??? Years of Education: N/A     Occupational History   ??? Not on file.     Social History Main Topics   ??? Smoking status: Never Smoker    ??? Smokeless tobacco: Never Used   ??? Alcohol Use: No   ??? Drug Use: No   ??? Sexual Activity: No     Other Topics Concern   ??? Not on file     Social History Narrative    ** Merged History Encounter **           FAMILY HISTORY: Family history from the initial psychiatric evaluation has been reviewed (reviewed/updated 06/07/2013) with no additional updates (I asked  patient and no additional family history provided).   Family History   Problem Relation Age of Onset   ??? Heart Disease Sister    ??? Arthritis-osteo Sister        REVIEW OF SYSTEMS: (reviewed/updated 06/07/2013)  Appetite:no change from normal   Sleep: improved   All other Review of Systems:  General ROS: positive for  -  fatigue and weight loss  Psychological ROS: positive for - anxiety, depression and memory difficulties  negative for - suicidal ideation  Neurological ROS: positive for - confusion and dizziness         MENTAL STATUS EXAM & VITALS     MENTAL STATUS EXAM (MSE):    MSE FINDINGS ARE WITHIN NORMAL LIMITS (WNL) UNLESS OTHERWISE STATED BELOW.    Orientation oriented to time, place and person   Vital Signs (BP,Pulse, Temp) See below (reviewed 06/07/2013); vital signs are WNL if not listed below.   Gait and Station Stable, no ataxia   Abnormal Muscular Movements/Tone/Behavior No EPS, no Tardive Dyskinesia, no abnormal muscular movements; wnl tone   Relations cooperative   General Appearance:  age appropriate and casually dressed   Language No aphasia or dysarthria   Speech:  normal pitch and normal volume   Thought Processes illogical, wnl rate of thoughts, poor abstract reasoning and computation   Thought Associations circumstantial, illogical and tangential   Thought Content preoccupations   Suicidal Ideations none   Homicidal Ideations none   Mood:  anxious and depressed   Affect:  blunted   Memory recent  adequate   Memory remote:  adequate   Concentration/Attention:  adequate   Fund of Knowledge Average   Insight:  good   Reliability good   Judgment:  fair        VITALS:     Patient Vitals for the past 24 hrs:   Temp Pulse Resp BP   06/07/13 0651 95.6 ??F (35.3 ??C) 67 16 126/63 mmHg            DATA     LABORATORY DATA:(reviewed/updated 06/07/2013)  No results found for this or any previous visit (from the past 24 hour(s)).  No results found for this basename: VALF2,  VALAC,  VALP,  VALPR,  CRBMT,  DS6,  CRBAM,   CRBAMP,  CARB2,  XCRBAM     No results found for this basename: LI,  LIH,  LITHM      RADIOLOGY REPORTS:(reviewed/updated 06/07/2013)  No results found.         MEDICATIONS     ALL MEDICATIONS:   Current Facility-Administered Medications   Medication Dose Route Frequency   ??? clonazePAM (KlonoPIN) tablet 0.5 mg  0.5 mg Oral BID   ??? atorvastatin (LIPITOR) tablet 40 mg  40 mg Oral QHS   ??? valsartan (DIOVAN) tablet 160 mg  160 mg Oral DAILY   ??? metoprolol succinate (TOPROL-XL) XL tablet 25 mg  25 mg Oral DAILY   ??? aspirin chewable tablet 81 mg  81 mg Oral DAILY   ??? calcium carbonate (TUMS) chewable tablet 200 mg [elemental]  200 mg Oral TID WITH MEALS   ??? pantoprazole (PROTONIX) tablet 40 mg  40 mg Oral ACB   ??? mirtazapine (REMERON) tablet 15 mg  15 mg Oral QHS   ??? cyanocobalamin (VITAMIN B12) tablet 500 mcg  500 mcg Oral DAILY   ??? loratadine (CLARITIN) tablet 10 mg  10 mg Oral DAILY PRN   ??? OLANZapine (ZyPREXA) tablet 2.5 mg  2.5 mg Oral Q6H PRN   ??? ziprasidone (GEODON) 10 mg in sterile water (preservative free) injection  10 mg IntraMUSCular BID PRN   ??? benztropine (COGENTIN) tablet 1 mg  1 mg Oral BID PRN   ??? benztropine (COGENTIN) injection 1 mg  1 mg IntraMUSCular Q12H PRN   ??? LORazepam (ATIVAN) injection 0.5 mg  0.5 mg IntraMUSCular Q4H PRN   ???  LORazepam (ATIVAN) tablet 0.5 mg  0.5 mg Oral Q4H PRN   ??? zolpidem (AMBIEN) tablet 5 mg  5 mg Oral QHS PRN   ??? acetaminophen (TYLENOL) tablet 650 mg  650 mg Oral Q4H PRN   ??? ibuprofen (MOTRIN) tablet 400 mg  400 mg Oral Q8H PRN   ??? magnesium hydroxide (MILK OF MAGNESIA) oral suspension 30 mL  30 mL Oral DAILY PRN   ??? nicotine (NICODERM CQ) 21 mg/24 hr patch 1 Patch  1 Patch TransDERmal DAILY PRN      SCHEDULED MEDICATIONS:   Current Facility-Administered Medications   Medication Dose Route Frequency   ??? clonazePAM (KlonoPIN) tablet 0.5 mg  0.5 mg Oral BID   ??? atorvastatin (LIPITOR) tablet 40 mg  40 mg Oral QHS   ??? valsartan (DIOVAN) tablet 160 mg  160 mg Oral DAILY   ???  metoprolol succinate (TOPROL-XL) XL tablet 25 mg  25 mg Oral DAILY   ??? aspirin chewable tablet 81 mg  81 mg Oral DAILY   ??? calcium carbonate (TUMS) chewable tablet 200 mg [elemental]  200 mg Oral TID WITH MEALS   ??? pantoprazole (PROTONIX) tablet 40 mg  40 mg Oral ACB   ??? mirtazapine (REMERON) tablet 15 mg  15 mg Oral QHS   ??? cyanocobalamin (VITAMIN B12) tablet 500 mcg  500 mcg Oral DAILY            ASSESSMENT & PLAN     The patient Gina Holloway is a 79 y.o. female who presents at this time for treatment of the following diagnoses:  Patient Active Hospital Problem List:  Mixed anxiety and depressive disorder (11/25/2012)  Assessment: severely depressed, anxious and shaky.   Plan: Continue Remeron and  clonazepam  Hx of medication noncompliance (10/16/2012)  Assessment: chronic  Plan: monitor closely. Educate  HTN (hypertension) (03/29/2008)  Assessment: stable  Plan: continue home medications.   GERD (gastroesophageal reflux disease) (03/29/2008)  Assessment: stable  Plan: continue home medications  Thyroid goiter (03/29/2008)  Assessment: stable  Plan: continue to monitor  Osteoporosis (03/29/2008)  Assessment: stable  Plan: Continue home medications  Mixed hyperlipidemia (05/23/2010)  Assessment: stable  Plan: continue home medications      Poor PO intake: Dietary consult. Add Ensure to meals.         In summary, Gina Holloway presents with a severe exacerbation of the principal diagnosis, Mixed anxiety and depressive disorder, which is /not improving.           A coordinated, multidisplinary treatment team round was conducted with the patient; that includes the nurse, psychiatric unit pharmcist, social worker and Clinical research associatewriter all present.     The following regarding medications was addressed during rounds with patient:   the risks and benefits of the proposed medication. The patient was given the opportunity to ask questions. Informed consent given to the use of the above medications. Will continue to adjust  psychiatric and non-psychiatric medications (see above "medication" section and orders section for details) as deemed appropriate & based upon diagnoses and response to treatment.     I will continue to order blood tests/labs and diagnostic tests as deemed appropriate and review results as they become available (see orders for details and above listed lab/test results).    I will order psychiatric records from previous psych hospitals to further elucidate the nature of patient's psychopathology and review once available.    I will gather additional collateral information from friends, family and o/p treatment team  to further elucidate the nature of patient's psychopathology and baselline level of psychiatric functioning.    Complete current electronic health record for patient has been reviewed today including consultant notes, ancillary staff notes, nurses and psychiatric tech notes    I will continue to provide individual, milieu, occupational, group, and substance abuse therapies to address target symptoms as deemed appropriate for the individual patient.      EXPECTED DISCHARGE DATE/DAY: TBD     DISPOSITION: Home       Signed By:   Narda Rutherford, MD  06/07/2013

## 2013-06-07 NOTE — Behavioral Health Treatment Team (Signed)
Pt rested in bed with eyes closed for approximately 7 hours. No reports made by pt of having any difficulty sleeping while staff conducted rounds. Respirations even and unlabored. No acute distress noted. Was maintained on q15min checks. Will continue to monitor.

## 2013-06-07 NOTE — Behavioral Health Treatment Team (Signed)
Problem: Patient Education: Go to Patient Education Activity  Goal: Patient/Family Education  Outcome: Progressing Towards Goal  Pt is mostly isolative from the unit this evening, affect blunted, mood less anxious. Pt is meds/meals compliant, able to come to groups in the unit. Pt stated she is feeling less anxious today. Will continue to monitor and encourage more progress.

## 2013-06-07 NOTE — Behavioral Health Treatment Team (Deleted)
Patient did not participate in community meeting

## 2013-06-07 NOTE — Behavioral Health Treatment Team (Signed)
Problem: Patient Education: Go to Patient Education Activity  Goal: Patient/Family Education  Outcome: Progressing Towards Goal  Pt is mostly isolative from the unit this morning, affect blunted, mood less anxious. Pt is meds/meals compliant, able to come to groups in the unit. Pt has participated in her treatment team meeting today. Will continue to monitor and encourage more progress.

## 2013-06-08 MED FILL — CLONAZEPAM 0.5 MG TAB: 0.5 mg | ORAL | Qty: 1

## 2013-06-08 MED FILL — CALCIUM CARBONATE 200 MG (500 MG) CHEWABLE TAB: 200 mg calcium (500 mg) | ORAL | Qty: 1

## 2013-06-08 MED FILL — TOPROL XL 50 MG TABLET,EXTENDED RELEASE: 50 mg | ORAL | Qty: 1

## 2013-06-08 MED FILL — ATORVASTATIN 40 MG TAB: 40 mg | ORAL | Qty: 1

## 2013-06-08 MED FILL — PROTONIX 40 MG TABLET,DELAYED RELEASE: 40 mg | ORAL | Qty: 1

## 2013-06-08 MED FILL — VITAMIN B-12 500 MCG TABLET: 500 mcg | ORAL | Qty: 1

## 2013-06-08 MED FILL — MIRTAZAPINE 15 MG TAB: 15 mg | ORAL | Qty: 1

## 2013-06-08 MED FILL — CHILDREN'S ASPIRIN 81 MG CHEWABLE TABLET: 81 mg | ORAL | Qty: 1

## 2013-06-08 MED FILL — VALSARTAN 80 MG TAB: 80 mg | ORAL | Qty: 2

## 2013-06-08 MED FILL — LORAZEPAM 0.5 MG TAB: 0.5 mg | ORAL | Qty: 1

## 2013-06-08 NOTE — Behavioral Health Treatment Team (Cosign Needed)
GROUP THERAPY PROGRESS NOTE    Gina Holloway is participating in [employer].     Group time: 30 minutes    Personal goal for participation: daily orientation    Goal orientation: personal    Group therapy participation: minimal    Therapeutic interventions reviewed and discussed: yes    Impression of participation: needed prompting

## 2013-06-08 NOTE — Progress Notes (Signed)
Problem: Depressed Mood (Adult/Pediatric)  Goal: *LTG: Returns to previous level of functioning and participates with after care plan  Affect and mood brighter. Patient interacted with with peers today. Spending more time in the milieu. Appetite is fair to good. Compliant with medication therapy.

## 2013-06-08 NOTE — Behavioral Health Treatment Team (Signed)
Patient complained of anxiety and requested med for the same. Received ativan po for anxiety.

## 2013-06-08 NOTE — Progress Notes (Signed)
Problem: Depressed Mood (Adult/Pediatric)  Goal: *STG: Attends activities and groups  Outcome: Progressing Towards Goal  Patient was received in the bed, very negative, responding in low tone with psychomotor retardation. She was encouraged to come to the dayroom. She later came and played cards with copatient and her affect was brighter. She was persuaded to be in the dayroom, she complied and sat watching TV with copatient.

## 2013-06-08 NOTE — Behavioral Health Treatment Team (Signed)
Pt rested in bed with eyes closed for approximately 7 hours. No reports made by pt of having any difficulty sleeping while staff conducted rounds. Respirations even and unlabored. No acute distress noted. Was maintained on q15min checks. Will continue to monitor.

## 2013-06-08 NOTE — Behavioral Health Treatment Team (Cosign Needed)
GROUP THERAPY PROGRESS NOTE    The patient Gina PaceShirley H Liston a 79 y.o. female is participating in Creative Expression Group.     Group time: 1 hour    Personal goal for participation: To concentrate on selected task    Goal orientation: social    Group therapy participation: active    Therapeutic interventions reviewed and discussed: Crafts, games, music    Impression of participation: The patient was attentive.    Adria DevonBeverly S Baker  06/08/2013  5:11 PM

## 2013-06-08 NOTE — Behavioral Health Treatment Team (Cosign Needed)
GROUP THERAPY PROGRESS NOTE    The patient Gina Holloway is participating in Reflection Group.    Group time: 30 minutes    Personal goal for participation: Personal    Goal orientation: Reflection    Group therapy participation: Active    Therapeutic interventions reviewed and discussed: Yes    Felicity Coyerthel Barnett-Johnson  06/08/2013

## 2013-06-08 NOTE — Behavioral Health Treatment Team (Cosign Needed)
GROUP THERAPY PROGRESS NOTE    The patient Devota PaceShirley H Woessner a 79 y.o. female is participating in Coping Skills Group.     Group time: 45 minutes    Personal goal for participation: To participate in relaxation activity    Goal orientation:  relaxation    Group therapy participation: active    Therapeutic interventions reviewed and discussed: yes    Impression of participation:  The patient was attentive.    Adria DevonBeverly S Baker  06/08/2013  1:02 PM

## 2013-06-08 NOTE — Behavioral Health Treatment Team (Signed)
PSYCHIATRIC PROGRESS NOTE         Patient Name  LESLIE JESTER   Date of Birth 1934-11-22   CSN [id number]   Medical Record Number  [id number]      Age  79 y.o.   PCP Paschal Dopp, MD   Admit date:  06/02/2013    Room Number  322/01  @ [facility]   Date of Service  06/08/2013            PSYCHOTHERAPY SESSION NOTE:  Length of psychotherapy session: 20 minutes  Main condition/diagnosis treated during session today: medication compliance    Interpersonal relationship issues and psychodynamic conflicts explored. Supportive psychotherapy provided in regards to various ongoing psychosocial stressors (including some of the following):   pre-admission and current problems   Housing issues   Occupational issues   Academic issues   Legal issues   Medical issues   Interpersonal conflicts   Stress of hospitalization              Worked on issues of denial & effects of substance dependency/use  Cognitive/Behavioral therapy provided  Reality-Oriented psychotherapy provided     Overall, patient is  progressing.    An extended energy and skill set was needed to engage pt in psychotherapy due to some of the following: resistiveness, complexity, negativity, confrontational nature, hostile behaviors, and/or severe abnormalities in thought processes/psychosis resulting in the loss of expressive/receptive language communication skills.                      E & M PROGRESS NOTE:         HISTORY       CC/HISTORY OF PRESENT ILLNESS/INTERVAL HISTORY:  (reviewed/updated 06/08/2013).  Pt was seen by me in the outpatient on 06/01/13 and was recommended for inpatient psych admission. Following is the HPI from outpatient initial Evaluation.   History of Present Illness: SHAVONNA CORELLA is a 79 y.o. Divorced, AA female who presents with symptoms of depression, anxiety and cognitive decline. Patient was brought by her cousin, who appears very supportive and concerned. She was seen one time by Dr. Syliva Overman and was  referred for speciality care here.   Pt reports that no medications has worked for her depression, anxiety and nerves in last 3-4 years. She was initially seen by Dr. Leilani Merl in July 2012 after couple of ED visits for depression/anxiety. She was started on Celexa and Remeron, which she only took for few days and stopped and did not FU with Dr. Leilani Merl. She continued some Rx on and off with her PCP. Mostly non compliant. She was admitted to [facility] inpatient psych in November x 2013 and was d/c after 11 days on Remeron and low dose lorazepam with outpatient FU with Dr. Franchot Erichsen. She FU with him couple of times but was consistently non compliant. Her last visit there was in April 2014. Since then she has received psych medications from her PCP, Dr. Marcy Siren. She was tried on Buspar and Xanax as well. Last month saw Dr. Alvina Filbert and had had multiple ED visits.   Today reports that she is not sleeping well, eating poorly, has lost weight, no energy and stays anxious all the time. She has significant psychomotor retardation. Responses are slow. No gross psychosis or mania. Denies any SI or HI.     MMSE: 19/30  GDS" 14/15    Pt was seen with the team today. She is more interactive and brighter. Subjectively  still not feeling too good.  Staff noted sleep improved, pt also reports that anxiety somewhat improved. Still psychomotorially slowed. Denies any AV hallucinations. Denies any SI or HI.         SIDE EFFECTS: (reviewed/updated 06/08/2013)  None reported or admitted to.     ALLERGIES:(reviewed/updated 06/08/2013)  Allergies   Allergen Reactions   ??? Biaxin [Clarithromycin] Unknown (comments)     Pt not sure what type of reaction   ??? Flagyl [Metronidazole] Unknown (comments)     Pt not sure of what type of reaction      MEDICATIONS PRIOR TO ADMISSION:(reviewed/updated 06/08/2013)  Prescriptions prior to admission   Medication Sig   ??? atorvastatin (LIPITOR) 40 mg tablet Take 40 mg by mouth daily. Indications:  HYPERCHOLESTEROLEMIA   ??? metoprolol succinate (TOPROL-XL) 25 mg XL tablet Take 25 mg by mouth daily. Indications: HYPERTENSION   ??? cyanocobalamin (VITAMIN B-12) 500 mcg tablet Take 500 mcg by mouth daily. Indications: PREVENTION OF VITAMIN B12 DEFICIENCY   ??? loratadine (CLARITIN) 10 mg tablet Take 10 mg by mouth daily as needed for Allergies. Indications: ALLERGIC RHINITIS   ??? aspirin 81 mg Tab Take 81 mg by mouth daily. Indications: MYOCARDIAL INFARCTION PREVENTION      PAST MEDICAL HISTORY: Past medical history from the initial psychiatric evaluation has been reviewed (reviewed/updated 06/08/2013) with no additional updates (I asked patient and no additional past medical history provided).   Past Medical History   Diagnosis Date   ??? HTN (hypertension) 03/29/2008   ??? Hypercholesterolemia 03/29/2008   ??? GERD (gastroesophageal reflux disease) 03/29/2008   ??? Thyroid goiter 03/29/2008   ??? Osteoporosis 03/29/2008   ??? Arrhythmia    ??? Murmur    ??? Other chest pain    ??? Reflux esophagitis    ??? Anxiety    ??? Essential hypertension    ??? Depression    ??? Dementia      Past Surgical History   Procedure Laterality Date   ??? Hx cholecystectomy     ??? Hx tubal ligation     ??? Pr colonoscopy,diagnostic  02/18/2006     Dr Park Breed      SOCIAL HISTORY: Social history from the initial psychiatric evaluation has been reviewed (reviewed/updated 06/08/2013) with no additional updates (I asked patient and no additional social history provided).   History     Social History   ??? Marital Status: DIVORCED     Spouse Name: N/A     Number of Children: N/A   ??? Years of Education: N/A     Occupational History   ??? Not on file.     Social History Main Topics   ??? Smoking status: Never Smoker    ??? Smokeless tobacco: Never Used   ??? Alcohol Use: No   ??? Drug Use: No   ??? Sexual Activity: No     Other Topics Concern   ??? Not on file     Social History Narrative    ** Merged History Encounter **           FAMILY HISTORY: Family history from the initial psychiatric evaluation  has been reviewed (reviewed/updated 06/08/2013) with no additional updates (I asked patient and no additional family history provided).   Family History   Problem Relation Age of Onset   ??? Heart Disease Sister    ??? Arthritis-osteo Sister        REVIEW OF SYSTEMS: (reviewed/updated 06/08/2013)  Appetite:no change from normal   Sleep: improved  All other Review of Systems:  General ROS: positive for  - fatigue and weight loss  Psychological ROS: positive for - anxiety, depression and memory difficulties  negative for - suicidal ideation  Neurological ROS: positive for - confusion and dizziness         MENTAL STATUS EXAM & VITALS     MENTAL STATUS EXAM (MSE):    MSE FINDINGS ARE WITHIN NORMAL LIMITS (WNL) UNLESS OTHERWISE STATED BELOW.    Orientation oriented to time, place and person   Vital Signs (BP,Pulse, Temp) See below (reviewed 06/08/2013); vital signs are WNL if not listed below.   Gait and Station Stable, no ataxia   Abnormal Muscular Movements/Tone/Behavior No EPS, no Tardive Dyskinesia, no abnormal muscular movements; wnl tone   Relations cooperative   General Appearance:  age appropriate and casually dressed   Language No aphasia or dysarthria   Speech:  normal pitch and normal volume   Thought Processes illogical, wnl rate of thoughts, poor abstract reasoning and computation   Thought Associations circumstantial, illogical and tangential   Thought Content preoccupations   Suicidal Ideations none   Homicidal Ideations none   Mood:  anxious and depressed   Affect:  blunted   Memory recent  adequate   Memory remote:  adequate   Concentration/Attention:  adequate   Fund of Knowledge Average   Insight:  good   Reliability good   Judgment:  fair        VITALS:     Patient Vitals for the past 24 hrs:   Temp Pulse Resp BP   06/08/13 0800 - 78 - 121/68 mmHg   06/08/13 0657 97.2 ??F (36.2 ??C) 66 18 117/60 mmHg            DATA     LABORATORY DATA:(reviewed/updated 06/08/2013)  No results found for this or any previous visit  (from the past 24 hour(s)).  No results found for this basename: VALF2,  VALAC,  VALP,  VALPR,  CRBMT,  DS6,  CRBAM,  CRBAMP,  CARB2,  XCRBAM     No results found for this basename: LI,  LIH,  LITHM      RADIOLOGY REPORTS:(reviewed/updated 06/08/2013)  No results found.         MEDICATIONS     ALL MEDICATIONS:   Current Facility-Administered Medications   Medication Dose Route Frequency   ??? clonazePAM (KlonoPIN) tablet 0.5 mg  0.5 mg Oral BID   ??? atorvastatin (LIPITOR) tablet 40 mg  40 mg Oral QHS   ??? valsartan (DIOVAN) tablet 160 mg  160 mg Oral DAILY   ??? metoprolol succinate (TOPROL-XL) XL tablet 25 mg  25 mg Oral DAILY   ??? aspirin chewable tablet 81 mg  81 mg Oral DAILY   ??? calcium carbonate (TUMS) chewable tablet 200 mg [elemental]  200 mg Oral TID WITH MEALS   ??? pantoprazole (PROTONIX) tablet 40 mg  40 mg Oral ACB   ??? mirtazapine (REMERON) tablet 15 mg  15 mg Oral QHS   ??? cyanocobalamin (VITAMIN B12) tablet 500 mcg  500 mcg Oral DAILY   ??? loratadine (CLARITIN) tablet 10 mg  10 mg Oral DAILY PRN   ??? OLANZapine (ZyPREXA) tablet 2.5 mg  2.5 mg Oral Q6H PRN   ??? ziprasidone (GEODON) 10 mg in sterile water (preservative free) injection  10 mg IntraMUSCular BID PRN   ??? benztropine (COGENTIN) tablet 1 mg  1 mg Oral BID PRN   ??? benztropine (COGENTIN) injection 1 mg  1 mg IntraMUSCular Q12H PRN   ??? LORazepam (ATIVAN) injection 0.5 mg  0.5 mg IntraMUSCular Q4H PRN   ??? LORazepam (ATIVAN) tablet 0.5 mg  0.5 mg Oral Q4H PRN   ??? zolpidem (AMBIEN) tablet 5 mg  5 mg Oral QHS PRN   ??? acetaminophen (TYLENOL) tablet 650 mg  650 mg Oral Q4H PRN   ??? ibuprofen (MOTRIN) tablet 400 mg  400 mg Oral Q8H PRN   ??? magnesium hydroxide (MILK OF MAGNESIA) oral suspension 30 mL  30 mL Oral DAILY PRN   ??? nicotine (NICODERM CQ) 21 mg/24 hr patch 1 Patch  1 Patch TransDERmal DAILY PRN      SCHEDULED MEDICATIONS:   Current Facility-Administered Medications   Medication Dose Route Frequency   ??? clonazePAM (KlonoPIN) tablet 0.5 mg  0.5 mg Oral BID   ???  atorvastatin (LIPITOR) tablet 40 mg  40 mg Oral QHS   ??? valsartan (DIOVAN) tablet 160 mg  160 mg Oral DAILY   ??? metoprolol succinate (TOPROL-XL) XL tablet 25 mg  25 mg Oral DAILY   ??? aspirin chewable tablet 81 mg  81 mg Oral DAILY   ??? calcium carbonate (TUMS) chewable tablet 200 mg [elemental]  200 mg Oral TID WITH MEALS   ??? pantoprazole (PROTONIX) tablet 40 mg  40 mg Oral ACB   ??? mirtazapine (REMERON) tablet 15 mg  15 mg Oral QHS   ??? cyanocobalamin (VITAMIN B12) tablet 500 mcg  500 mcg Oral DAILY            ASSESSMENT & PLAN     The patient Devota PaceShirley H Spooner is a 79 y.o. female who presents at this time for treatment of the following diagnoses:  Patient Active Hospital Problem List:  Mixed anxiety and depressive disorder (11/25/2012)  Assessment: severely depressed, anxious and shaky.   Plan: Continue Remeron and  clonazepam  Hx of medication noncompliance (10/16/2012)  Assessment: chronic  Plan: monitor closely. Educate  HTN (hypertension) (03/29/2008)  Assessment: stable  Plan: continue home medications.   GERD (gastroesophageal reflux disease) (03/29/2008)  Assessment: stable  Plan: continue home medications  Thyroid goiter (03/29/2008)  Assessment: stable  Plan: continue to monitor  Osteoporosis (03/29/2008)  Assessment: stable  Plan: Continue home medications  Mixed hyperlipidemia (05/23/2010)  Assessment: stable  Plan: continue home medications      Poor PO intake: Dietary consult. Add Ensure to meals.         In summary, Devota PaceShirley H Welker presents with a severe exacerbation of the principal diagnosis, Mixed anxiety and depressive disorder, which is /not improving.           A coordinated, multidisplinary treatment team round was conducted with the patient; that includes the nurse, psychiatric unit pharmcist, social worker and Clinical research associatewriter all present.     The following regarding medications was addressed during rounds with patient:   the risks and benefits of the proposed medication. The patient was given the  opportunity to ask questions. Informed consent given to the use of the above medications. Will continue to adjust psychiatric and non-psychiatric medications (see above "medication" section and orders section for details) as deemed appropriate & based upon diagnoses and response to treatment.     I will continue to order blood tests/labs and diagnostic tests as deemed appropriate and review results as they become available (see orders for details and above listed lab/test results).    I will order psychiatric records from previous psych hospitals to further elucidate the nature of  patient's psychopathology and review once available.    I will gather additional collateral information from friends, family and o/p treatment team to further elucidate the nature of patient's psychopathology and baselline level of psychiatric functioning.    Complete current electronic health record for patient has been reviewed today including consultant notes, ancillary staff notes, nurses and psychiatric tech notes    I will continue to provide individual, milieu, occupational, group, and substance abuse therapies to address target symptoms as deemed appropriate for the individual patient.      EXPECTED DISCHARGE DATE/DAY: TBD     DISPOSITION: Home       Signed By:   Adela Lank, MD  06/08/2013

## 2013-06-08 NOTE — Behavioral Health Treatment Team (Cosign Needed)
The patient Gina PaceShirley H Bonifas is participating in Relaxation Group.    Group time: 30 minutes    Personal goal for participation: Personal    Goal orientation: To learn to relax    Group therapy participation: Active    Therapeutic interventions reviewed and discussed: Yes    Felicity Coyerthel Barnett-Johnson  06/08/2013

## 2013-06-09 MED FILL — ATORVASTATIN 40 MG TAB: 40 mg | ORAL | Qty: 1

## 2013-06-09 MED FILL — CALCIUM CARBONATE 200 MG (500 MG) CHEWABLE TAB: 200 mg calcium (500 mg) | ORAL | Qty: 1

## 2013-06-09 MED FILL — TOPROL XL 50 MG TABLET,EXTENDED RELEASE: 50 mg | ORAL | Qty: 1

## 2013-06-09 MED FILL — CLONAZEPAM 0.5 MG TAB: 0.5 mg | ORAL | Qty: 1

## 2013-06-09 MED FILL — VALSARTAN 80 MG TAB: 80 mg | ORAL | Qty: 2

## 2013-06-09 MED FILL — MIRTAZAPINE 15 MG TAB: 15 mg | ORAL | Qty: 1

## 2013-06-09 MED FILL — VITAMIN B-12 500 MCG TABLET: 500 mcg | ORAL | Qty: 1

## 2013-06-09 MED FILL — CHILDREN'S ASPIRIN 81 MG CHEWABLE TABLET: 81 mg | ORAL | Qty: 1

## 2013-06-09 MED FILL — PROTONIX 40 MG TABLET,DELAYED RELEASE: 40 mg | ORAL | Qty: 1

## 2013-06-09 NOTE — Behavioral Health Treatment Team (Cosign Needed)
Pt attended the NAMI group.

## 2013-06-09 NOTE — Behavioral Health Treatment Team (Signed)
Patient is cooperative and complaint. Her affect and mood is brighter with psychomotor retardation. She follows directions. She sat with copatients and watched TV. Q4315min checks done.

## 2013-06-09 NOTE — Progress Notes (Signed)
Problem: Depressed Mood (Adult/Pediatric)  Goal: *LTG: Returns to previous level of functioning and participates with after care plan  Outcome: Progressing Towards Goal  Affect and mood bright. Patient initiates conversations with peers and staff. Has been ambulating in hallway and also attending groups. Patient is compliant with medication therapy.

## 2013-06-09 NOTE — Behavioral Health Treatment Team (Cosign Needed)
GROUP THERAPY PROGRESS NOTE    The patient Devota PaceShirley H Swalley a 79 y.o. female is participating in Creative Expression Group.     Group time: 1 hour    Personal goal for participation: To concentrate on selected task    Goal orientation: social    Group therapy participation: active    Therapeutic interventions reviewed and discussed: Crafts, games, music    Impression of participation: The patient was attentive.    Adria DevonBeverly S Baker  06/09/2013  5:06 PM

## 2013-06-09 NOTE — Behavioral Health Treatment Team (Signed)
Art Therapy Group    Gina Holloway attended Art Therapy Group    Group Time: 45 minutes    Treatment Goal for Each Participant: Explore creative means of relaxation/distraction to improve coping. Brief education regarding seeking out healthy distractions.    Participation    minimal and passive    Engagement with Peers   none     Impression of Participation: Downcast, reported "anxiety for three years" with no known precipitating event.     Experimented wit two colors of paint briefly when encouraged.     Reported that anxiety had not eased at all by end of group and reported she was fearful it would never go away.    Commended for effort she did make in group and encouraged her in continuing activities to distract herself from anxious thoughts.

## 2013-06-09 NOTE — Behavioral Health Treatment Team (Cosign Needed)
GROUP THERAPY PROGRESS NOTE    Gina Holloway is participating in Community Meeting.     Group time: 30 minutes    Personal goal for participation:  Unit orientation    Goal orientation: Community    Group therapy participation: active    Therapeutic interventions reviewed and discussed: Yes    Impression of participation: good

## 2013-06-09 NOTE — Behavioral Health Treatment Team (Signed)
PSYCHIATRIC PROGRESS NOTE         Patient Name  Gina Holloway   Date of Birth 01/22/1935   CSN [id number]   Medical Record Number  [id number]      Age  78 y.o.   PCP Paschal Dopp, MD   Admit date:  06/02/2013    Room Number  322/01  @ [facility]   Date of Service  06/09/2013            PSYCHOTHERAPY SESSION NOTE:  Length of psychotherapy session: 20 minutes  Main condition/diagnosis treated during session today: medication compliance    Interpersonal relationship issues and psychodynamic conflicts explored. Supportive psychotherapy provided in regards to various ongoing psychosocial stressors (including some of the following):   pre-admission and current problems   Housing issues   Occupational issues   Academic issues   Legal issues   Medical issues   Interpersonal conflicts   Stress of hospitalization              Worked on issues of denial & effects of substance dependency/use  Cognitive/Behavioral therapy provided  Reality-Oriented psychotherapy provided     Overall, patient is  progressing.    An extended energy and skill set was needed to engage pt in psychotherapy due to some of the following: resistiveness, complexity, negativity, confrontational nature, hostile behaviors, and/or severe abnormalities in thought processes/psychosis resulting in the loss of expressive/receptive language communication skills.                      E & M PROGRESS NOTE:         HISTORY       CC/HISTORY OF PRESENT ILLNESS/INTERVAL HISTORY:  (reviewed/updated 06/09/2013).  Pt was seen by me in the outpatient on 06/01/13 and was recommended for inpatient psych admission. Following is the HPI from outpatient initial Evaluation.   History of Present Illness: Gina Holloway is a 79 y.o. Divorced, AA female who presents with symptoms of depression, anxiety and cognitive decline. Patient was brought by her cousin, who appears very supportive and concerned. She was seen one time by Dr. Syliva Overman and was  referred for speciality care here.   Pt reports that no medications has worked for her depression, anxiety and nerves in last 3-4 years. She was initially seen by Dr. Leilani Merl in July 2012 after couple of ED visits for depression/anxiety. She was started on Celexa and Remeron, which she only took for few days and stopped and did not FU with Dr. Leilani Merl. She continued some Rx on and off with her PCP. Mostly non compliant. She was admitted to [facility] inpatient psych in November x 2013 and was d/c after 11 days on Remeron and low dose lorazepam with outpatient FU with Dr. Franchot Erichsen. She FU with him couple of times but was consistently non compliant. Her last visit there was in April 2014. Since then she has received psych medications from her PCP, Dr. Marcy Siren. She was tried on Buspar and Xanax as well. Last month saw Dr. Alvina Filbert and had had multiple ED visits.   Today reports that she is not sleeping well, eating poorly, has lost weight, no energy and stays anxious all the time. She has significant psychomotor retardation. Responses are slow. No gross psychosis or mania. Denies any SI or HI.     MMSE: 19/30  GDS" 14/15    Pt was seen with the team today. She is more interactive and brighter. Reports  feeling good but is still somatic and slow. Staff noted sleep improved, pt also reports that anxiety somewhat improved. Still psychomotorially slowed. Denies any AV hallucinations. Denies any SI or HI.         SIDE EFFECTS: (reviewed/updated 06/09/2013)  None reported or admitted to.     ALLERGIES:(reviewed/updated 06/09/2013)  Allergies   Allergen Reactions   ??? Biaxin [Clarithromycin] Unknown (comments)     Pt not sure what type of reaction   ??? Flagyl [Metronidazole] Unknown (comments)     Pt not sure of what type of reaction      MEDICATIONS PRIOR TO ADMISSION:(reviewed/updated 06/09/2013)  Prescriptions prior to admission   Medication Sig   ??? atorvastatin (LIPITOR) 40 mg tablet Take 40 mg by mouth daily. Indications:  HYPERCHOLESTEROLEMIA   ??? metoprolol succinate (TOPROL-XL) 25 mg XL tablet Take 25 mg by mouth daily. Indications: HYPERTENSION   ??? cyanocobalamin (VITAMIN B-12) 500 mcg tablet Take 500 mcg by mouth daily. Indications: PREVENTION OF VITAMIN B12 DEFICIENCY   ??? loratadine (CLARITIN) 10 mg tablet Take 10 mg by mouth daily as needed for Allergies. Indications: ALLERGIC RHINITIS   ??? aspirin 81 mg Tab Take 81 mg by mouth daily. Indications: MYOCARDIAL INFARCTION PREVENTION      PAST MEDICAL HISTORY: Past medical history from the initial psychiatric evaluation has been reviewed (reviewed/updated 06/09/2013) with no additional updates (I asked patient and no additional past medical history provided).   Past Medical History   Diagnosis Date   ??? HTN (hypertension) 03/29/2008   ??? Hypercholesterolemia 03/29/2008   ??? GERD (gastroesophageal reflux disease) 03/29/2008   ??? Thyroid goiter 03/29/2008   ??? Osteoporosis 03/29/2008   ??? Arrhythmia    ??? Murmur    ??? Other chest pain    ??? Reflux esophagitis    ??? Anxiety    ??? Essential hypertension    ??? Depression    ??? Dementia      Past Surgical History   Procedure Laterality Date   ??? Hx cholecystectomy     ??? Hx tubal ligation     ??? Pr colonoscopy,diagnostic  02/18/2006     Dr Park Breed      SOCIAL HISTORY: Social history from the initial psychiatric evaluation has been reviewed (reviewed/updated 06/09/2013) with no additional updates (I asked patient and no additional social history provided).   History     Social History   ??? Marital Status: DIVORCED     Spouse Name: N/A     Number of Children: N/A   ??? Years of Education: N/A     Occupational History   ??? Not on file.     Social History Main Topics   ??? Smoking status: Never Smoker    ??? Smokeless tobacco: Never Used   ??? Alcohol Use: No   ??? Drug Use: No   ??? Sexual Activity: No     Other Topics Concern   ??? Not on file     Social History Narrative    ** Merged History Encounter **           FAMILY HISTORY: Family history from the initial psychiatric evaluation  has been reviewed (reviewed/updated 06/09/2013) with no additional updates (I asked patient and no additional family history provided).   Family History   Problem Relation Age of Onset   ??? Heart Disease Sister    ??? Arthritis-osteo Sister        REVIEW OF SYSTEMS: (reviewed/updated 06/09/2013)  Appetite:no change from normal   Sleep:  improved   All other Review of Systems:  General ROS: positive for  - fatigue and weight loss  Psychological ROS: positive for - anxiety, depression and memory difficulties  negative for - suicidal ideation  Neurological ROS: positive for - confusion and dizziness         MENTAL STATUS EXAM & VITALS     MENTAL STATUS EXAM (MSE):    MSE FINDINGS ARE WITHIN NORMAL LIMITS (WNL) UNLESS OTHERWISE STATED BELOW.    Orientation oriented to time, place and person   Vital Signs (BP,Pulse, Temp) See below (reviewed 06/09/2013); vital signs are WNL if not listed below.   Gait and Station Stable, no ataxia   Abnormal Muscular Movements/Tone/Behavior No EPS, no Tardive Dyskinesia, no abnormal muscular movements; wnl tone   Relations cooperative   General Appearance:  age appropriate and casually dressed   Language No aphasia or dysarthria   Speech:  normal pitch and normal volume   Thought Processes illogical, wnl rate of thoughts, poor abstract reasoning and computation   Thought Associations circumstantial, illogical and tangential   Thought Content preoccupations   Suicidal Ideations none   Homicidal Ideations none   Mood:  anxious and depressed   Affect:  blunted   Memory recent  adequate   Memory remote:  adequate   Concentration/Attention:  adequate   Fund of Knowledge Average   Insight:  good   Reliability good   Judgment:  fair        VITALS:     Patient Vitals for the past 24 hrs:   Temp Pulse Resp BP   06/09/13 0548 96.5 ??F (35.8 ??C) 67 16 124/69 mmHg            DATA     LABORATORY DATA:(reviewed/updated 06/09/2013)  No results found for this or any previous visit (from the past 24 hour(s)).  No  results found for this basename: VALF2,  VALAC,  VALP,  VALPR,  CRBMT,  DS6,  CRBAM,  CRBAMP,  CARB2,  XCRBAM     No results found for this basename: LI,  LIH,  LITHM      RADIOLOGY REPORTS:(reviewed/updated 06/09/2013)  No results found.         MEDICATIONS     ALL MEDICATIONS:   Current Facility-Administered Medications   Medication Dose Route Frequency   ??? clonazePAM (KlonoPIN) tablet 0.5 mg  0.5 mg Oral BID   ??? atorvastatin (LIPITOR) tablet 40 mg  40 mg Oral QHS   ??? valsartan (DIOVAN) tablet 160 mg  160 mg Oral DAILY   ??? metoprolol succinate (TOPROL-XL) XL tablet 25 mg  25 mg Oral DAILY   ??? aspirin chewable tablet 81 mg  81 mg Oral DAILY   ??? calcium carbonate (TUMS) chewable tablet 200 mg [elemental]  200 mg Oral TID WITH MEALS   ??? pantoprazole (PROTONIX) tablet 40 mg  40 mg Oral ACB   ??? mirtazapine (REMERON) tablet 15 mg  15 mg Oral QHS   ??? cyanocobalamin (VITAMIN B12) tablet 500 mcg  500 mcg Oral DAILY   ??? loratadine (CLARITIN) tablet 10 mg  10 mg Oral DAILY PRN   ??? OLANZapine (ZyPREXA) tablet 2.5 mg  2.5 mg Oral Q6H PRN   ??? ziprasidone (GEODON) 10 mg in sterile water (preservative free) injection  10 mg IntraMUSCular BID PRN   ??? benztropine (COGENTIN) tablet 1 mg  1 mg Oral BID PRN   ??? benztropine (COGENTIN) injection 1 mg  1 mg IntraMUSCular Q12H PRN   ???  LORazepam (ATIVAN) injection 0.5 mg  0.5 mg IntraMUSCular Q4H PRN   ??? LORazepam (ATIVAN) tablet 0.5 mg  0.5 mg Oral Q4H PRN   ??? zolpidem (AMBIEN) tablet 5 mg  5 mg Oral QHS PRN   ??? acetaminophen (TYLENOL) tablet 650 mg  650 mg Oral Q4H PRN   ??? ibuprofen (MOTRIN) tablet 400 mg  400 mg Oral Q8H PRN   ??? magnesium hydroxide (MILK OF MAGNESIA) oral suspension 30 mL  30 mL Oral DAILY PRN   ??? nicotine (NICODERM CQ) 21 mg/24 hr patch 1 Patch  1 Patch TransDERmal DAILY PRN      SCHEDULED MEDICATIONS:   Current Facility-Administered Medications   Medication Dose Route Frequency   ??? clonazePAM (KlonoPIN) tablet 0.5 mg  0.5 mg Oral BID   ??? atorvastatin (LIPITOR) tablet 40  mg  40 mg Oral QHS   ??? valsartan (DIOVAN) tablet 160 mg  160 mg Oral DAILY   ??? metoprolol succinate (TOPROL-XL) XL tablet 25 mg  25 mg Oral DAILY   ??? aspirin chewable tablet 81 mg  81 mg Oral DAILY   ??? calcium carbonate (TUMS) chewable tablet 200 mg [elemental]  200 mg Oral TID WITH MEALS   ??? pantoprazole (PROTONIX) tablet 40 mg  40 mg Oral ACB   ??? mirtazapine (REMERON) tablet 15 mg  15 mg Oral QHS   ??? cyanocobalamin (VITAMIN B12) tablet 500 mcg  500 mcg Oral DAILY            ASSESSMENT & PLAN     The patient Gina Holloway is a 79 y.o. female who presents at this time for treatment of the following diagnoses:  Patient Active Hospital Problem List:  Mixed anxiety and depressive disorder (11/25/2012)  Assessment: severely depressed, anxious and shaky.   Plan: Continue Remeron and  clonazepam  Hx of medication noncompliance (10/16/2012)  Assessment: chronic  Plan: monitor closely. Educate  HTN (hypertension) (03/29/2008)  Assessment: stable  Plan: continue home medications.   GERD (gastroesophageal reflux disease) (03/29/2008)  Assessment: stable  Plan: continue home medications  Thyroid goiter (03/29/2008)  Assessment: stable  Plan: continue to monitor  Osteoporosis (03/29/2008)  Assessment: stable  Plan: Continue home medications  Mixed hyperlipidemia (05/23/2010)  Assessment: stable  Plan: continue home medications      Poor PO intake: Dietary consult. Add Ensure to meals.         In summary, Gina Holloway presents with a severe exacerbation of the principal diagnosis, Mixed anxiety and depressive disorder, which is /not improving.           A coordinated, multidisplinary treatment team round was conducted with the patient; that includes the nurse, psychiatric unit pharmcist, social worker and Clinical research associate all present.     The following regarding medications was addressed during rounds with patient:   the risks and benefits of the proposed medication. The patient was given the opportunity to ask questions. Informed  consent given to the use of the above medications. Will continue to adjust psychiatric and non-psychiatric medications (see above "medication" section and orders section for details) as deemed appropriate & based upon diagnoses and response to treatment.     I will continue to order blood tests/labs and diagnostic tests as deemed appropriate and review results as they become available (see orders for details and above listed lab/test results).    I will order psychiatric records from previous psych hospitals to further elucidate the nature of patient's psychopathology and review once available.  I will gather additional collateral information from friends, family and o/p treatment team to further elucidate the nature of patient's psychopathology and baselline level of psychiatric functioning.    Complete current electronic health record for patient has been reviewed today including consultant notes, ancillary staff notes, nurses and psychiatric tech notes    I will continue to provide individual, milieu, occupational, group, and substance abuse therapies to address target symptoms as deemed appropriate for the individual patient.      EXPECTED DISCHARGE DATE/DAY:  tomorrow     DISPOSITION: Home  Psych FU with Dr. Lelon HuhLakhani   IOP strongly recommended.       Signed By:   Adela LankSultan A Hadlei Stitt, MD  06/09/2013

## 2013-06-09 NOTE — Behavioral Health Treatment Team (Signed)
GROUP THERAPY PROGRESS NOTE    Gina Holloway is participating in Coping Skills Group.     Group time: 40 minutes.    Personal goal for participation: Discharge planning.    Goal orientation: Personal.    Group therapy participation: Minimal.    Therapeutic interventions reviewed and discussed: The patient said very little in group but did admit to feeling depressed and anxious, along with isolating before she came into the hospital.     Impression of participation: The patient did not seem to have a sense of what she might be able to do to reduce her risk of hospitalization and than to say, "I hope they can make me feel better."  She will probably need considerable assistance in developing a discharge plan that addresses her low energy and motivation.

## 2013-06-09 NOTE — Behavioral Health Treatment Team (Signed)
Pt rested quietly during  The night eyes closed  Respirations visible during  Fifteen minute rounds.voiced no complaint of pain or discomfort.Patient slept 8 hours.

## 2013-06-09 NOTE — Behavioral Health Treatment Team (Cosign Needed)
GROUP THERAPY PROGRESS NOTE    The patient Gina Holloway a 79 y.o. female is participating in Coping Skills Group.     Group time: 45 minutes    Personal goal for participation: To participate in relaxation activity    Goal orientation:  relaxation    Group therapy participation: active    Therapeutic interventions reviewed and discussed: favorite ways to relax  Impression of participation:  The patient was attentive.    Audelia HivesBeverly S Baker  06/09/2013  12:38 PM

## 2013-06-10 MED ORDER — MIRTAZAPINE 15 MG TAB
15 mg | ORAL_TABLET | Freq: Every evening | ORAL | Status: DC
Start: 2013-06-10 — End: 2013-07-06

## 2013-06-10 MED ORDER — VALSARTAN 160 MG TAB
160 mg | ORAL_TABLET | Freq: Every day | ORAL | Status: DC
Start: 2013-06-10 — End: 2013-09-25

## 2013-06-10 MED ORDER — CLONAZEPAM 0.5 MG TAB
0.5 mg | ORAL_TABLET | Freq: Two times a day (BID) | ORAL | Status: DC
Start: 2013-06-10 — End: 2013-07-06

## 2013-06-10 MED FILL — CALCIUM CARBONATE 200 MG (500 MG) CHEWABLE TAB: 200 mg calcium (500 mg) | ORAL | Qty: 1

## 2013-06-10 MED FILL — VITAMIN B-12 500 MCG TABLET: 500 mcg | ORAL | Qty: 1

## 2013-06-10 MED FILL — MIRTAZAPINE 15 MG TAB: 15 mg | ORAL | Qty: 1

## 2013-06-10 MED FILL — PROTONIX 40 MG TABLET,DELAYED RELEASE: 40 mg | ORAL | Qty: 1

## 2013-06-10 MED FILL — ATORVASTATIN 40 MG TAB: 40 mg | ORAL | Qty: 1

## 2013-06-10 MED FILL — TOPROL XL 50 MG TABLET,EXTENDED RELEASE: 50 mg | ORAL | Qty: 1

## 2013-06-10 MED FILL — CHILDREN'S ASPIRIN 81 MG CHEWABLE TABLET: 81 mg | ORAL | Qty: 1

## 2013-06-10 MED FILL — VALSARTAN 80 MG TAB: 80 mg | ORAL | Qty: 2

## 2013-06-10 MED FILL — CLONAZEPAM 0.5 MG TAB: 0.5 mg | ORAL | Qty: 1

## 2013-06-10 NOTE — Behavioral Health Treatment Team (Signed)
Social Work     Met with pt in team for discharge planning    Pt had an improved mood and affect, exhibited no gross signs of depression, psychosis nor appears to be a harm to herself or others. The pt was discharged and will be transported home by her cousin.  The pt will follow up with [facility]'s IOP program and with Dr. Stefan Church on June 1st at 2pm.  The providers will be able to access pt's continuing care paperwork via [facility].      Delight Stare, LCSW

## 2013-06-10 NOTE — Behavioral Health Treatment Team (Signed)
Patient was discharged today. Discharge forms were signed and given to patient. Discharge instructions were explained to patient and patient indicated understanding verbally. Patient has been compliant with taking medications and stated understanding the importance of taking medications upon discharge. Patient was given prescriptions and belongings, and was escorted from the unit by staff.

## 2013-06-10 NOTE — Behavioral Health Treatment Team (Cosign Needed)
GROUP THERAPY PROGRESS NOTE    Gina Holloway is participating in [employer] Meeting.     Group time: 30 minutes    Personal goal for participation:  Unit orientation    Goal orientation: Community    Group therapy participation: active    Therapeutic interventions reviewed and discussed: Yes    Impression of participation: good

## 2013-06-12 NOTE — Behavioral Health Treatment Team (Signed)
[facility]  Group Psychotherapy Note  [address], [address]  [place], [place] [id number]      Devota PaceShirley H Prashad  10-21-1934        General Information    Willodean RosenthalShirley H. [name] participated in the [facility] Intensive Outpatient Program today, 06/12/13.    Psychotherapy Session     Start time: 11:39 am   Stop time: 12:31 pm     Problem number: 1a  Short term goal (STG): Verbally identify, if possible, the source of depressed mood and acclimate to group therapy.    Patient Mental Status and Mood/Affect:  Depressed, Anxious, Worried, Preoccupied, Quiet    Patient Behavior and Appearance: Neatly Groomed, Attentive, Cooperative     Intervention/Techniques: The group members were provided a handout with a drawing of servants/waiters bringing food to a table for a patron and, on the other side of the table, hands reaching out and taking things from the table. The patients were asked if they could identify the givers and takers in their lives. It was also suggested that the givers and takers could not only be persons in their lives but also parts of themselves that are beneficial or, perhaps, less so.      Group focused on identifying sources of support and caring (either internally or externally) or obstacles to their short-term goals and well-being.   Therapist used teaching, listening, prompting, guiding, feedback, and challenging as techniques.     Focus of Session/Patient Response and Progress Towards Goal: The patient group members were asked to identify personal and/or external resources that they consider to be helpful or harmful to achieving their treatment objectives. The patient said she felt ???the loss of her friends??? and how hard her grief has been. She was tearful when talking about these losses. She added, ???I haven???t been speaking up for myself,??? due to her grief and depression. She said she did value a daughter, who lives in [place], calling and talking to her by telephone daily.    Clinician Signature:    Clinician PRINTED Name and Credential:  06/12/2013  4:01 PM    Elsie LincolnWilliam W Fraker, LCSW

## 2013-06-15 NOTE — Behavioral Health Treatment Team (Signed)
[facility]  Group Psychotherapy Note  [address]  [place] [id number]      Devota PaceShirley H Holloway  1934/11/09        General Information    Willodean RosenthalShirley H. [name] participated in the [facility] Intensive Outpatient Program today, 06/15/13.    Psychotherapy Session     Start time: 9:34 am   Stop time: 10:29 am     Problem number: 1a  Short term goal (STG): Verbally identify, if possible, the source of depressed mood and acclimate to group therapy.    Patient Mental Status and Mood/Affect:  Depressed, Anxious, Worried, Preoccupied, Quiet    Patient Behavior and Appearance: Neatly Groomed, Cooperative with prompting     Intervention/Techniques: The group reported on their physical, psychological, and spiritual condition.   Group focused on checking-in emotionally and discussing the obstacles in their lives.   Therapist used listening, prompting, guiding, feedback, and challenging as techniques.     The focus of the session was on emotionally checking-in and identifying immediate obstacles to the successful completion of short-term goals. The patient said she feeling tired and that she was worried about her balance. ???I???m having to move rather slowly.??? She appeared to listen to the conversation but was relatively quiet. When prompted, she responded with brief answers.     Clinician Signature:    Clinician PRINTED Name and Credential:  06/15/2013  4:17 PM    Elsie LincolnWilliam W Fraker, LCSW

## 2013-06-15 NOTE — Behavioral Health Treatment Team (Signed)
[facility]  Psychotherapy Note  [address] [place]  08/18/1934        General Information    Gina Holloway participated in the [facility] on 06/15/13.    Psychotherapy Session    Start time: 10:40a  Stop time: 11:35a    Problem number: 1  Short term goal (STP): increase understanding of fear    Patient Mental Status and Mood/Affect:Depressed, Restricted, Anxious and Sullen    Patient Behavior and Appearance: Neatly Groomed, Poor Eye Contact , Cooperative, Needed Prompting and Withdrawn/Quiet    Intervention/Techniques: Informed, Validated/Supported, Refocused, Reflected, Guided, Reframed, Modeled/Rehearsed, Prompted/Cued, Listened/Empathized, Observed/Monitored, Promoted Peer Support, Clarified, Reinforced, Facilitated Disclosure, Provided Feedback and Evaluated/Interpreted    Focus of Session/Patient Response and Progress Towards Goal: Utilized group ritual of Affirmation Box exercise to encourage creative thought and challenge perspectives; group rituals foster structure and emotional safety.  Group discussion focused on the concept of fear, how it is "taking up space" in group members' lives and thoughts, the role it is playing, possible origins and utilized RET to begin to tease out rational versus irrational fears.  SW does not seem to be making progress towards goals.    Clinician Signature:    Clinician PRINTED Name and Credential:  06/15/2013  2:07 PM    Lindie Spruce, LCSW

## 2013-06-15 NOTE — Behavioral Health Treatment Team (Signed)
[facility]al [facility]  [facility]  [address]  [place] [id number]      Devota PaceShirley H Palen  12-14-1934        General Information    Devota PaceShirley H Spiller participated in the [facility] of IOP on 06/12/13.    Psychotherapy Session    Start time: 10:40a  Stop time: 11:30a    Problem number: 1  Short term goal (STP): To acclimate to group, begin to process feelings of grief and loss    Patient Mental Status and Mood/Affect:Depressed, Tearful, Restricted, Anxious, Worried and Preoccupied    Patient Behavior and Appearance: Neatly Groomed, Needed Prompting and Withdrawn/Quiet    Intervention/Techniques: Informed, Validated/Supported, Reflected, Guided, Reframed, Modeled/Rehearsed, Prompted/Cued, Listened/Empathized, Observed/Monitored, Promoted Peer Support, Clarified, Reinforced, Facilitated Disclosure, Provided Feedback and Evaluated/Interpreted    Focus of Session/Patient Response and Progress Towards Goal: Session focused on group members' various grief processes and the upcoming Mother's Day weekend has been triggering various losses for group members.  Discussed the nature of the grief process and how group members have been coping with their losses.  Group members were able to share different coping strategies and areas of joy mixed in with their grief.  SW was able to identify her losses and how they have impacted her.      Clinician Signature:    Clinician PRINTED Name and Credential:  06/15/2013  9:42 AM    Tana Coastebecca Carrington Mullenax, LCSW

## 2013-06-16 ENCOUNTER — Encounter

## 2013-06-16 NOTE — Behavioral Health Treatment Team (Signed)
[facility] Outpatient [facility]al [facility]  Group Psychotherapy Note  [address], [address]  [place], [place] [id number]      Devota PaceShirley H Lemus  [birth year]        General Information    Willodean RosenthalShirley H. [name] participated in the [facility] Intensive Outpatient Program today, 06/15/13.    Psychotherapy Session     Start time: 11:39 am   Stop time: 12:32 pm     Problem number: 1a  Short term goal (STG): Verbally identify, if possible, the source of depressed mood and acclimate to group therapy.    Patient Mental Status and Mood/Affect:  Depressed, Anxious, Worried, Preoccupied, Quiet    Patient Behavior and Appearance: Neatly Groomed, Cooperative with prompting     Intervention/Techniques: The group members were provided a handout with the image of a wall with irregular stones. They were asked to name certain blocks on the paper with their emotional blocks or concerns that are impeding their forward progress.   Group members were encouraged to consider both internal and external blocks that appear to be in the way of meeting their short-term goals. If they have enough time, they may also share how they are thinking about removing their blocks.  Therapist used teaching, listening, prompting, guiding, feedback, and challenging as techniques.     Focus of Session/Patient Response and Progress Towards Goal: The focus of the session was on identifying hindrances to their progress. The patient said she ???needs glasses.??? She seemed to be easily startled when spoken to in group. She added that she needs ???physical therapy??? and has been concerned about her potential to fall. ???I???ve had poor sleep and am feeling jumpy??? I???m still waiting for my pills to kick in on my depression.??? The patient appeared to have a more difficult time adjusting to the IOP group today than the previous week.    Clinician Signature:    Clinician PRINTED Name and Credential:  06/16/2013  10:15 AM    Elsie LincolnWilliam W  Fraker, LCSW

## 2013-06-16 NOTE — Progress Notes (Signed)
Shanda BumpsJessica please ignore my message that I left on your v/c. I got the confirmation from our intake that you have called for this referral this morning.

## 2013-06-16 NOTE — Telephone Encounter (Signed)
Patient needs a "thorough examination" per her Psychologist     Please call to get her on schedule at [phone]

## 2013-06-16 NOTE — Progress Notes (Signed)
Pt of intensive outpt program at [facility], from 0930 to 1230, mon, wed, fri.  Zenaida NieceVan driver doesn't feel safe with pt, needs additional help with her.  Bill, Interior and spatial designerdirector of program, suggested physical therapy [facility], will order from [facility].  Annette StableBill wants to be contacted when therapy begins, [phone].  Will fu as needed.

## 2013-06-18 NOTE — Progress Notes (Signed)
[facility] therapist, Gina Holloway went to see yesterday.  Medication reconciliation done, some meds were not that same.  Pt didn't have same meds, did med rec with therapist.  Pt was drowsy and was seen at 1600 yesterday.  Order given for occupational therapy for safety eval.  Pt not taking claritin, metoprolol, and lipitor.  Will route to dr Swazilandjordan and nurses and fu as needed.

## 2013-06-22 NOTE — Telephone Encounter (Signed)
Gina BeatShannon Holloway, nurse with [facility] is calling with concerns regarding this patient. She is concerned about the patient living alone and her depression. Nurse states patient is not stable enough to live alone, her depression is getting worse and she is not taking her medications properly. Nurse is going to get in touch with a Child psychotherapistocial Worker for help but may have to get Adult Protective Services involved if patient doesn't get any help.    Gina HerterShannon Holloway [phone]

## 2013-06-24 NOTE — Progress Notes (Signed)
HISTORY OF PRESENT ILLNESS  Gina Holloway is a 79 y.o. female.  HPI   Follow up from inpt stay for major depression.  Pt is here with her cousin.  C/o "not having good balance with walking".  C/o sx for over the past 6-7 months.  Have eval in the hospital but pt is not able to give any details of her stay.  Pt thinks they told her that is it d/t her depression.  Records thru [facility] are not complete to review.  Legs feels weak.  Has walker but does not always use it.  no focal sx noted.  No back pain noted.  Has not had issues with gait prior to this.      Medications were also changed in the hospital.  Now on Diovan for BP.  Not taking Lipitor for now.      Patient Active Problem List   Diagnosis Code   ??? GERD (gastroesophageal reflux disease) 530.81   ??? Thyroid goiter 240.9   ??? Osteoporosis 733.00   ??? Encounter for long-term (current) use of other medications V58.69   ??? Palpitations 785.1   ??? Mixed hyperlipidemia 272.2   ??? Shortness of breath on exertion 786.05   ??? Dementia in conditions classified elsewhere without behavioral disturbance 294.10   ??? Hx of medication noncompliance V15.81   ??? Mixed anxiety and depressive disorder 300.4   ??? Essential hypertension 401.9       Current Outpatient Prescriptions   Medication Sig Dispense Refill   ??? neomycin-polymyxin-dexamethasone (DEXACINE) 3.5 mg/g-10,000 unit/g-0.1 % ophthalmic ointment Administer  to right eye three (3) times daily.       ??? clonazePAM (KLONOPIN) 0.5 mg tablet Take 1 Tab by mouth two (2) times a day for 30 days. Max Daily Amount: 1 mg. Indications: Depression with anxiety  60 Tab  0   ??? mirtazapine (REMERON) 15 mg tablet Take 1 Tab by mouth nightly for 30 days. Indications: MAJOR DEPRESSIVE DISORDER  30 Tab  0   ??? valsartan (DIOVAN) 160 mg tablet Take 1 Tab by mouth daily for 30 days. Indications: HYPERTENSION  30 Tab  0   ??? aspirin 81 mg Tab Take 81 mg by mouth daily. Indications: MYOCARDIAL INFARCTION PREVENTION           Allergies    Allergen Reactions   ??? Biaxin [Clarithromycin] Unknown (comments)     Pt not sure what type of reaction   ??? Flagyl [Metronidazole] Unknown (comments)     Pt not sure of what type of reaction       Past Medical History   Diagnosis Date   ??? HTN (hypertension) 03/29/2008   ??? Hypercholesterolemia 03/29/2008   ??? GERD (gastroesophageal reflux disease) 03/29/2008   ??? Thyroid goiter 03/29/2008   ??? Osteoporosis 03/29/2008   ??? Arrhythmia    ??? Murmur    ??? Other chest pain    ??? Reflux esophagitis    ??? Anxiety    ??? Essential hypertension    ??? Depression    ??? Dementia        Past Surgical History   Procedure Laterality Date   ??? Hx cholecystectomy     ??? Hx tubal ligation     ??? Pr colonoscopy,diagnostic  02/18/2006     Dr Chancy Milroy       Family History   Problem Relation Age of Onset   ??? Heart Disease Sister    ??? Arthritis-osteo Sister  History   Substance Use Topics   ??? Smoking status: Never Smoker    ??? Smokeless tobacco: Never Used   ??? Alcohol Use: No          Component Value Date/Time   WBC 4.3 06/03/2013 11:02 AM   HGB 13.8 06/03/2013 11:02 AM   HCT 42.8 06/03/2013 11:02 AM   PLATELET 256 06/03/2013 11:02 AM   MCV 90.1 06/03/2013 11:02 AM       Component Value Date/Time   Cholesterol, total 152 06/03/2013 11:02 AM   HDL Cholesterol 68 06/03/2013 11:02 AM   LDL, calculated 57 06/03/2013 11:02 AM   Triglyceride 135 06/03/2013 11:02 AM   CHOL/HDL Ratio 2.2 06/03/2013 11:02 AM       Component Value Date/Time   ALT 23 06/03/2013 11:02 AM   AST 26 06/03/2013 11:02 AM   Alk. phosphatase 66 06/03/2013 11:02 AM   Bilirubin, direct 0.0 04/22/2007  1:46 PM   Bilirubin, total 0.3 06/03/2013 11:02 AM       Component Value Date/Time   GFR est AA >60 06/03/2013 11:02 AM   GFR est non-AA >60 06/03/2013 11:02 AM   Creatinine 0.89 06/03/2013 11:02 AM   BUN 20 06/03/2013 11:02 AM   Sodium 142 06/03/2013 11:02 AM   Potassium 4.4 06/03/2013 11:02 AM   Chloride 103 06/03/2013 11:02 AM   CO2 31 06/03/2013 11:02 AM        Component Value Date/Time   TSH 0.68 06/03/2013 11:02 AM    T4, Free 1.32 05/06/2012  3:23 PM      Lab Results   Component Value Date/Time    Hemoglobin A1c 6.0 10/13/2012 11:28 AM       Review of Systems   Constitutional: Negative for malaise/fatigue.   HENT: Negative for congestion.    Eyes: Negative for blurred vision.   Respiratory: Negative for cough and shortness of breath.    Cardiovascular: Negative for chest pain, palpitations and leg swelling.   Gastrointestinal: Negative for heartburn, abdominal pain and constipation.   Genitourinary: Negative for dysuria, urgency and frequency.   Musculoskeletal: Negative for back pain and joint pain.   Neurological: Negative for dizziness, tingling and headaches.   Endo/Heme/Allergies: Negative for environmental allergies.   Psychiatric/Behavioral: Positive for depression and memory loss. Negative for suicidal ideas, hallucinations and substance abuse. The patient is nervous/anxious and has insomnia.        Physical Exam   Constitutional: She appears well-developed and well-nourished.   BP 109/46   Pulse 84   Temp(Src) 95.6 ??F (35.3 ??C) (Oral)   Resp 16   Ht 5' 3" (1.6 m)   Wt 128 lb (58.06 kg)   BMI 22.68 kg/m2   SpO2 96%   LMP 06/11/1990  Masked face.  Slow and low volume speech. Psychomotor retardation.       HENT:   Right Ear: Tympanic membrane and ear canal normal.   Left Ear: Tympanic membrane and ear canal normal.   Nose: No mucosal edema or rhinorrhea.   Mouth/Throat: Oropharynx is clear and moist and mucous membranes are normal.   Neck: Normal range of motion. Neck supple. No thyromegaly present.   Cardiovascular: Normal rate and regular rhythm.    No murmur heard.  Pulmonary/Chest: Effort normal and breath sounds normal.   Abdominal: Soft. Bowel sounds are normal. There is no tenderness.   Musculoskeletal: Normal range of motion. She exhibits no edema.   Lymphadenopathy:     She has no cervical adenopathy.  Neurological: No cranial nerve deficit or sensory deficit. Gait (very slow gait but steady once she gets up from  the chair) abnormal.   Diffuse weakness.     Skin: Skin is warm and dry.   Psychiatric: Her affect is blunt. She is slowed and withdrawn. She exhibits a depressed mood.   Nursing note and vitals reviewed.      ASSESSMENT and PLAN  Gina Holloway was seen today for difficulty walking.    Diagnoses and associated orders for this visit:    Unsteady gait//  Psychomotor retardation  Generalized Weakness    Trial of home PT    Mixed anxiety and depressive disorder  Has follow up with psych on next week.  Her cousin will discussed with her psychiatrist also at next visit.      Essential hypertension  Stable with Diovan.      Mixed hyperlipidemia  Continue to monitor.  Work on diet and exercise.    Encounter for long-term (current) use of other medications        Follow-up Disposition:  Return in about 3 months (around 09/24/2013).  reviewed diet, exercise and weight control  cardiovascular risk and specific lipid/LDL goals reviewed  reviewed medications and side effects in detail    I have discussed diagnosis listed in this note with pt and/or family. I have discussed treatment plans and options and the risk/benefit analysis of those options, including safe use of medications and possible medication side effects.   Through the use of shared decision making we have agreed to the above plan. The patient has received an after-visit summary and questions were answered concerning future plans and follow up.  Advise pt of any urgent changes then to proceed to the ER.

## 2013-06-24 NOTE — Progress Notes (Signed)
Pt c/o losing her balance and having a difficulty time walking x 6 months.    Pt not taking Lipitor or Toprol. Pt states that Dr. Vance PeperSultan gave her a prescription for Diovan and she is not sure why she is not taking Lipitor.

## 2013-06-25 NOTE — Progress Notes (Signed)
Daughter called and wanting to know what home health services are coming to patient's house.  Informed psr angie to let her know it was [facility] and provided their contact info, will fu as needed.

## 2013-07-02 NOTE — Progress Notes (Signed)
Received message from romina scott from [facility] sayin her original PT order was expired and she has received 6 total visits to address bed mobility, transfers, gait, BLE strengthening, and dynamic standing balance. She has made some progress but continues to have limited BLE strength, endurance, and balance.  Pt would benefit from a few more weeks of intervention, twice a week for another 3 weeks.  Order given to renew, will fu as needed.

## 2013-07-06 MED ORDER — MIRTAZAPINE 15 MG TAB
15 mg | ORAL_TABLET | Freq: Every evening | ORAL | Status: AC
Start: 2013-07-06 — End: 2013-08-05

## 2013-07-06 MED ORDER — CLONAZEPAM 0.5 MG TAB
0.5 mg | ORAL_TABLET | Freq: Two times a day (BID) | ORAL | Status: DC
Start: 2013-07-06 — End: 2013-09-25

## 2013-07-06 NOTE — Telephone Encounter (Signed)
Daughter would like to speak to you regarding suggestion of cane for mother.

## 2013-07-06 NOTE — Discharge Summary (Signed)
Name:       Gina Holloway, [name]               Admitted:    06/02/2013                                               Discharged:  06/10/2013  Account #:  [id number]                     DOB:         16-Nov-1934  Consultant: Adela Lank, MD             Age          79                                 DISCHARGE SUMMARY      DISCHARGE DIAGNOSES:  AXIS I: Major depressive disorder, moderate to severe.  AXIS II: Deferred.  AXIS III: Hypertension, gastroesophageal reflux disease, hypothyroidism,  osteoporosis, mixed hyperlipidemia.  AXIS IV: Mild to moderate.  AXIS V: 45-50 on discharge.    HISTORY OF PRESENT ILLNESS: Please see initial psychiatric evaluation by  this writer dated 06/03/2013 for full details as well as for description of  mental status examination at the time of presentation.    HOSPITALIZATION COURSE: The patient is a 79 year old divorced African  American female who was seen by me in our clinic on 06/01/2013 for  worsening depression, anxiety, and cognitive decline and was recommended  admission. The patient was last seen by [facility], M.D. and was  referred to me for specialty care. The patient has a history of depression  and anxiety and has been seen by Benancio Deeds, M.D. and Dr. Franchot Erichsen  in the past. The patient's last hospitalization was in November 2013 at  [facility]. On new office visit the patient's mini mental status examination score  was 19/30 and geriatric depression was 14/15. The patient was restarted on  her home medication which included Lipitor, Diovan, metoprolol, calcium  carbonate, pantoprazole, small dose of clonazepam, mirtazapine and vitamin  B12.    By 06/05/2013, the patient was starting to feel a little bit better, but  she was not eating and drinking that well. She had been sleeping well. The  patient was started on a small dose of Wellbutrin and was recommended to go  to intensive day program after she is discharged. The patient was very  ambivalent about any kind  of treatment on an outpatient basis. By  06/08/2013 the patient was starting to feel better. She continued to have  poor eye contact and hygiene was marginal. The patient was sleeping and  eating better. She was also compliant with medication. By 06/09/2013, the  patient's affect was getting brighter and she was ambulating pretty well.  She reported that her p.o. intake of  and fluid has got better. By  06/10/2013, the patient was starting to feel better. She was cooperative  and pleasant. Denied any suicidal or homicidal ideation. The patient was  stable for discharge and considered to be at low risk of harm to self or  others.    DISCHARGE MEDICATIONS:  1. Clonazepam 0.5 mg b.i.d.  2. Lipitor 40 mg at bedtime.  3. Diovan 160 mg daily.  4. Metoprolol 25 mg daily.  5. Aspirin 81 mg daily.  6. Calcium carbonate 200 mg t.i.d. with meals.  7. Protonix 40 mg before meals breakfast.  8. Remeron 15 mg at bedtime.  9. Vitamin B12 500 mcg daily.    DISPOSITION: The patient was discharged in the care of her cousin. The  patient will start to attend intensive day program in our clinic and will  see me for outpatient followup on 07/06/2013 at 2 p.m.    PROGNOSIS: Fair.              Adela LankSultan A Jamiere Gulas, MD    cc:    Adela LankSultan A Midge Momon, MD      SAL/wmx; D: 07/05/2013 11:31 A; T: 07/06/2013 05:30 A; DOC# [id number]; Job#  [id number]

## 2013-07-13 NOTE — Progress Notes (Signed)
INTERIM PROGRESS:  Gina Holloway is a 79 y.o. Divorced, AA  female and was seen today for follow-up of psychiatric condition and psychotropic medication management. She came with her cousin. She is attending IOP snce her d/c from inpatient psych on 06/10/13 after 8 days for severe depression/confusion.     Gina Holloway reports the following psychiatric symptoms:  depression, agitation, delusions and anxiety.  The symptoms have been present for years and are of moderate severity. The symptoms occur daily.  Additional symptoms include difficulty sleeping. Gina Holloway has benefited from medication.    Pt reports that she is doing ok and is coming along. Reports compliance with medications. She has lost few more lbs. She likes coming to IOP but feels tired a lot. Psychomotor slowing continues. She is getting some home PT. No gross psychosis or mania. Denies any SI or HI.     Additional factors affecting mental health:  Sleep:  good  Appetite:  no change from normal  Side Effects:  none    Review of Systems:  Weight: has decreased 2 pounds over last 4 weeks  BP 146/65   Pulse 93   Ht 5\' 3"  (1.6 m)   Wt 58.06 kg (128 lb)   BMI 22.68 kg/m2   SpO2 96%   LMP 06/11/1990  Gait: gait unsteady          Mental Status Exam:   Sensorium  oriented to time, place and person   Relations cooperative and passive   Appearance:  age appropriate and casually dressed   Speech:  monotone and soft   Thought Process: circumstantial and tangential   Thought Content free of delusions and free of hallucinations   Suicidal ideations none   Homicidal ideations none   Mood:  anxious and depressed   Affect:  constricted   Memory recent  adequate   Memory remote:  adequate   Concentration:  adequate   Abstraction:  concrete   Insight:  fair   Reliability fair   Judgment:  fair       DIAGNOSIS AND IMPRESSION:  Axis I:     ICD-9-CM   1. Mixed anxiety and depressive disorder 300.4   2. Hx of medication noncompliance V15.81     Axis II: def  Axis III:   Past  Medical History   Diagnosis Date   ??? HTN (hypertension) 03/29/2008   ??? Hypercholesterolemia 03/29/2008   ??? GERD (gastroesophageal reflux disease) 03/29/2008   ??? Thyroid goiter 03/29/2008   ??? Osteoporosis 03/29/2008   ??? Arrhythmia    ??? Murmur    ??? Other chest pain    ??? Reflux esophagitis    ??? Anxiety    ??? Essential hypertension    ??? Depression    ??? Dementia      Axis IV: Problems with primary support group, Problems related to social environment and Other psychosocial or environmental problems  Axis V: 51-60 moderate symptoms    Gina Holloway is responding to treatment and is tolerating treatment well. Psychoeducation, medication teaching, co-morbid illness and pertinent health factors to manage care were discussed. Overall, patient is stable at this time but will require ongoing medication management.    PLAN:   Current Outpatient Prescriptions   Medication Sig Dispense Refill   ??? clonazePAM (KLONOPIN) 0.5 mg tablet Take 1 Tab by mouth two (2) times a day for 30 days. Max Daily Amount: 1 mg. Indications: Depression with anxiety  60 Tab  2   ??? mirtazapine (REMERON) 15 mg tablet  Take 1 Tab by mouth nightly for 30 days. Indications: MAJOR DEPRESSIVE DISORDER  30 Tab  2   ??? neomycin-polymyxin-dexamethasone (DEXACINE) 3.5 mg/g-10,000 unit/g-0.1 % ophthalmic ointment Administer  to right eye three (3) times daily.       ??? valsartan (DIOVAN) 160 mg tablet Take 1 Tab by mouth daily for 30 days. Indications: HYPERTENSION  30 Tab  0   ??? aspirin 81 mg Tab Take 81 mg by mouth daily. Indications: MYOCARDIAL INFARCTION PREVENTION       ??? metoprolol succinate (TOPROL-XL) 25 mg XL tablet     2       1.  Medications:   Continue clonazepam and Remeron.     2.  Psychotherapy:  Supportive therapy/psychoeducation on her slow recovery and getting tired easily due to lack of physical activity.     3.  Follow-up Disposition:  Return in about 3 months (around 10/06/2013).    4.  Other:  Nutritional/health counseling on further weight loss ? Discussed  maintaining healthy weight.                    The treatment plan was discussed with the patient. Risk vs benefit and side effects were discussed.  She agrees with the plan. Patient instructed to call with any side effects, questions or issues.       Adela LankSultan A Kordell Jafri, MD  07/13/2013

## 2013-07-17 NOTE — Telephone Encounter (Signed)
Called and could not leave a message as mail box was full.

## 2013-08-15 MED ORDER — MIRTAZAPINE 15 MG TAB
15 mg | ORAL_TABLET | ORAL | Status: DC
Start: 2013-08-15 — End: 2013-12-09

## 2013-09-25 MED ORDER — ALPRAZOLAM 1 MG TAB
1 mg | ORAL_TABLET | Freq: Once | ORAL | Status: AC
Start: 2013-09-25 — End: 2013-09-25

## 2013-09-25 MED ORDER — MEMANTINE ER 14 MG-DONEPEZIL 10 MG CAPSULE SPRINKLE,EXT.RELEASE 24 HR
14-10 mg | ORAL_CAPSULE | Freq: Every day | ORAL | Status: DC
Start: 2013-09-25 — End: 2013-10-20

## 2013-09-25 NOTE — Patient Instructions (Signed)
A Healthy Lifestyle: After Your Visit  Your Care Instructions  A healthy lifestyle can help you feel good, stay at a healthy weight, and have plenty of energy for both work and play. A healthy lifestyle is something you can share with your whole family.  A healthy lifestyle also can lower your risk for serious health problems, such as high blood pressure, heart disease, and diabetes.  You can follow a few steps listed below to improve your health and the health of your family.  Follow-up care is a key part of your treatment and safety. Be sure to make and go to all appointments, and call your doctor if you are having problems. It???s also a good idea to know your test results and keep a list of the medicines you take.  How can you care for yourself at home?  ?? Do not eat too much sugar, fat, or fast foods. You can still have dessert and treats now and then. The goal is moderation.  ?? Start small to improve your eating habits. Pay attention to portion sizes, drink less juice and soda pop, and eat more fruits and vegetables.  ?? Eat a healthy amount of food. A 3-ounce serving of meat, for example, is about the size of a deck of cards. Fill the rest of your plate with vegetables and whole grains.  ?? Limit the amount of soda and sports drinks you have every day. Drink more water when you are thirsty.  ?? Eat at least 5 servings of fruits and vegetables every day. It may seem like a lot, but it is not hard to reach this goal. A serving or helping is 1 piece of fruit, 1 cup of vegetables, or 2 cups of leafy, raw vegetables. Have an apple or some carrot sticks as an afternoon snack instead of a candy bar. Try to have fruits and/or vegetables at every meal.  ?? Make exercise part of your daily routine. You may want to start with simple activities, such as walking, bicycling, or slow swimming. Try to be active 30 to 60 minutes every day. You do not need to do all 30 to 60  minutes all at once. For example, you can exercise 3 times a day for 10 or 20 minutes. Moderate exercise is safe for most people, but it is always a good idea to talk to your doctor before starting an exercise program.  ?? Keep moving. Mow the lawn, work in the garden, or clean your house. Take the stairs instead of the elevator at work.  ?? If you smoke, quit. People who smoke have an increased risk for heart attack, stroke, cancer, and other lung illnesses. Quitting is hard, but there are ways to boost your chance of quitting tobacco for good.  ?? Use nicotine gum, patches, or lozenges.  ?? Ask your doctor about stop-smoking programs and medicines.  ?? Keep trying.  In addition to reducing your risk of diseases in the future, you will notice some benefits soon after you stop using tobacco. If you have shortness of breath or asthma symptoms, they will likely get better within a few weeks after you quit.  ?? Limit how much alcohol you drink. Moderate amounts of alcohol (up to 2 drinks a day for men, 1 drink a day for women) are okay. But drinking too much can lead to liver problems, high blood pressure, and other health problems.  Family health  If you have a family, there are many things you can   do together to improve your health.  ?? Eat meals together as a family as often as possible.  ?? Eat healthy foods. This includes fruits, vegetables, lean meats and dairy, and whole grains.  ?? Include your family in your fitness plan. Most people think of activities such as jogging or tennis as the way to fitness, but there are many ways you and your family can be more active. Anything that makes you breathe hard and gets your heart pumping is exercise. Here are some tips:  ?? Walk to do errands or to take your child to school or the bus.  ?? Go for a family bike ride after dinner instead of watching TV.   Where can you learn more?   Go to http://www.healthwise.net/[facility]   Enter U807 in the search box to learn more about "A Healthy Lifestyle: After Your Visit."   ?? 2006-2015 Healthwise, Incorporated. Care instructions adapted under license by [employer] (which disclaims liability or warranty for this information). This care instruction is for use with your licensed healthcare professional. If you have questions about a medical condition or this instruction, always ask your healthcare professional. Healthwise, Incorporated disclaims any warranty or liability for your use of this information.  Content Version: 10.5.422740; Current as of: December 19, 2012

## 2013-09-25 NOTE — Progress Notes (Signed)
Name:  Gina Holloway:  DOB:  04-29-1934  MRN:  [id number]     PCP and referring physician:  Anson Fret, MD    Thank you for referring this interesting patient to [facility] neurology clinic. Following is the summary of my clinical findings.     Chief complaint: memory problems.     HISTORY OF PRESENT ILLNESS:  Gina Holloway is a 79 y.o. female who is here today for the evaluation of above complaints. She is here with her daughter and they say that her memory has been declining for almost 4 years now. She forgets the names of family members, she forgets what she ate or did same day. She doesn't drive any more for last 3 years scarred of mistakes due to memory. She has a relative to watch over her bathing, clothing etc. She lives alone and doesn't cook. Takes ready food to warm in microwave. Also has meals on wheels services. Spends the whole day in home or in porch. Very little to no social interactions. Doesn't even go to church. Bills and finances are automated and she can no longer keep an eye on those things. She has poor sleep quality and has to take pills every night to sleep. Says that she stays drowsy or sleepy the whole day. Takes naps at daytime too. Says that she has nervousness and jittery at times. She has no family in town as per the daughter. Her kids live many hours drive away. She has severe depression and has been hospitalized for that many times. The daughter thinks she has much better mood now since on depression meds but nervousness could still be an issue. She just came home from rehab and thinks that her gait has been very good using cane. She is not falling anymore. Patient herself doesn't seem interested in sharing anything. She has no specific complaints at all. The daughter says that patient has been depressed since the death of her Ex husband 4 years ago.   There are no other neurological issues to address today.     Review of Systems:   Constitutional: negative for fevers, chills and fatigue. No recent weight change, fever,fatigue, sleep difficulties, or loss of appetite.   HEENT: No hearing loss, ringing in the ears, chronic sinus problem, nose bleeds sore throat, voice change, hoarseness, swollen glands in neck, or difficulties with chewing and swallowing. No eye redness or irritation  Integument: No rash/itching, change in skin color, change in hair/nails, or change in color/size of moles  Respiratory: negative for cough, sputum or hemoptysis  Cardiovascular: negative for chest pain, dyspnea, palpitations  Gastrointestinal: negative for nausea, vomiting, diarhea or constipation, no black tarry stools, no abdominal pain  Urinary: No frequent urination, burning or painful urination, blood in urine, incontinence or dribbling.  Hematologic/lymphatic: negative for easy bleeding, lymphadenopathy and petechiae  Musculoskeletal:negative for myalgias, arthralgias, stiff joints and neck pain  Psych: depressed and low energy.   Overall, there are no active complaints today except those mentioned in HPI above.    Allergies   Allergen Reactions   ??? Biaxin [Clarithromycin] Unknown (comments)     Pt not sure what type of reaction   ??? Flagyl [Metronidazole] Unknown (comments)     Pt not sure of what type of reaction     Past Medical History   Diagnosis Date   ??? HTN (hypertension) 03/29/2008   ??? Hypercholesterolemia 03/29/2008   ??? GERD (gastroesophageal reflux disease) 03/29/2008   ??? Thyroid goiter 03/29/2008   ???  Osteoporosis 03/29/2008   ??? Arrhythmia    ??? Murmur    ??? Other chest pain    ??? Reflux esophagitis    ??? Anxiety    ??? Essential hypertension    ??? Depression    ??? Dementia      Past Surgical History   Procedure Laterality Date   ??? Hx cholecystectomy     ??? Hx tubal ligation     ??? Pr colonoscopy,diagnostic  02/18/2006     Dr Chancy Milroy     History     Social History   ??? Marital Status: DIVORCED     Spouse Name: N/A     Number of Children: N/A    ??? Years of Education: N/A     Occupational History   ??? Not on file.     Social History Main Topics   ??? Smoking status: Never Smoker    ??? Smokeless tobacco: Never Used   ??? Alcohol Use: No   ??? Drug Use: No   ??? Sexual Activity: No     Other Topics Concern   ??? Not on file     Social History Narrative    ** Merged History Encounter **          Family History   Problem Relation Age of Onset   ??? Heart Disease Sister    ??? Arthritis-osteo Sister      Current Outpatient Prescriptions   Medication Sig   ??? LORazepam (ATIVAN) 0.5 mg tablet Take  by mouth.   ??? busPIRone (BUSPAR) 15 mg tablet Take 15 mg by mouth two (2) times a day.   ??? loratadine (CLARITIN) 10 mg tablet Take 10 mg by mouth.   ??? atorvastatin (LIPITOR) 40 mg tablet Take  by mouth daily.   ??? PARoxetine (PAXIL) 20 mg tablet Take  by mouth daily.   ??? mirtazapine (REMERON) 15 mg tablet TAKE 1 TABLET BY MOUTH EVERY NIGHT AT BEDTIME   ??? aspirin 81 mg Tab Take 81 mg by mouth daily. Indications: MYOCARDIAL INFARCTION PREVENTION     No current facility-administered medications for this visit.     PHYSICAL EXAMINATION:      Visit Vitals   Item Reading   ??? BP 130/70 mmHg   ??? Pulse 73   ??? Ht 5' 3"  (1.6 m)   ??? Wt 128 lb (58.06 kg)   ??? BMI 22.68 kg/m2   ??? SpO2 97%    performed today in clinic    Brief general Exam:  Patient is sitting comfortably in no acute distress.  Skin: No rashes, no icterus or jaundice  Neck: supple, no JVD  CVS: S1+S2+0,   GI: soft, non tender, no organomegaly, bowel sounds present  Chest: CTA anteriorly   Musculoskeletal: no joint swelling, redness or muscle tenderness.   Psych: sluggish and tired.     NEUROLOGICAL EXAMINATION:  Higher Mental functions: Oriented to time, place and person. MMSE= 18-19/20. Mood is sluggish and tired., No aphasia or dysarthria.   CN: Bilateral pupils are reactive to light and accomodation, Direct and consensual reflexes intact. No APD or hippus either side. there are no  filed cuts noted on limited bed side evaluation, Pupils seem symmetrically rounded. Fundoscopy reveals clear disc margins without papilledema.  EOMI both sides with no nystagmus or square wave jerks, patient denies any diplopia.  Face is symmetrical bilaterally for sensations and strength testing.  Hearing seems symmetrical and within normal range to limited bed side eval with finger rub  testing. Palate elevates symmetrically bilaterally .  Tongue is without atrophy or fasciculations, Shoulder shrug strength is intact bilaterally.  Motor:  Strength is 5/5 all over in all the muscles groups of extremities. Preserved muscle bulk and tone  DTRs are symmetrical, 2+ all over   I couldn't illicit any myotonia or observed any fasiculations,cramps otherwise.   Sensory:   Sensations are intact symmetrically to LT and sharpness, Vibrations are perceived normally as well, I didn't notice any abnormal spinal level. Romberg is negative.   Misc:   No cerebellar signs. I don't see any past pointing or dysmetria  Gait:  Slow, cautious, small steps, uses cane but can walk without it.     Lab. Data:   Labs available in EMR/Epic are reviewed and abnormal values are noticed.      Results for orders placed or performed during the hospital encounter of 56/38/75   METABOLIC PANEL, COMPREHENSIVE   Result Value Ref Range    Sodium 142 136 - 145 mmol/L    Potassium 4.4 3.5 - 5.1 mmol/L    Chloride 103 97 - 108 mmol/L    CO2 31 21 - 32 mmol/L    Anion gap 8 5 - 15 mmol/L    Glucose 91 65 - 100 mg/dL    BUN 20 6 - 20 MG/DL    Creatinine 0.89 0.45 - 1.15 MG/DL    BUN/Creatinine ratio 22 (H) 12 - 20      GFR est AA >60 >60 ml/min/1.35m    GFR est non-AA >60 >60 ml/min/1.752m   Calcium 9.0 8.5 - 10.1 MG/DL    Bilirubin, total 0.3 0.2 - 1.0 MG/DL    ALT 23 12 - 78 U/L    AST 26 15 - 37 U/L    Alk. phosphatase 66 45 - 117 U/L    Protein, total 6.5 6.4 - 8.2 g/dL    Albumin 3.1 (L) 3.5 - 5.0 g/dL    Globulin 3.4 2.0 - 4.0 g/dL     A-G Ratio 0.9 (L) 1.1 - 2.2     GLUCOSE, FASTING   Result Value Ref Range    Glucose 91 65 - 100 MG/DL   CBC W/O DIFF   Result Value Ref Range    WBC 4.3 3.6 - 11.0 K/uL    RBC 4.75 3.80 - 5.20 M/uL    HGB 13.8 11.5 - 16.0 g/dL    HCT 42.8 35.0 - 47.0 %    MCV 90.1 80.0 - 99.0 FL    MCH 29.1 26.0 - 34.0 PG    MCHC 32.2 30.0 - 36.5 g/dL    RDW 12.3 11.5 - 14.5 %    PLATELET 256 150 - 400 K/uL   TSH, 3RD GENERATION   Result Value Ref Range    TSH 0.68 0.36 - 3.74 uIU/mL   LIPID PANEL   Result Value Ref Range    LIPID PROFILE          Cholesterol, total 152 <200 MG/DL    Triglyceride 135 <150 MG/DL    HDL Cholesterol 68 MG/DL    LDL, calculated 57 0 - 100 MG/DL    VLDL, calculated 27 MG/DL    CHOL/HDL Ratio 2.2 0 - 5.0     URINALYSIS W/MICROSCOPIC   Result Value Ref Range    Color YELLOW/STRAW      Appearance CLEAR CLEAR      Specific gravity 1.010 1.003 - 1.030      pH (UA) 7.5  5.0 - 8.0      Protein NEGATIVE  NEG mg/dL    Glucose NEGATIVE  NEG mg/dL    Ketone NEGATIVE  NEG mg/dL    Bilirubin NEGATIVE  NEG      Blood NEGATIVE  NEG      Urobilinogen 1.0 0.2 - 1.0 EU/dL    Nitrites NEGATIVE  NEG      Leukocyte Esterase TRACE (A) NEG      WBC 0-4 0 - 4 /hpf    RBC 0-5 0 - 5 /hpf    Epithelial cells FEW FEW /lpf    Bacteria NEGATIVE  NEG /hpf   DRUG SCREEN, URINE   Result Value Ref Range    AMPHETAMINE NEGATIVE  NEG      BARBITURATES NEGATIVE  NEG      BENZODIAZEPINE NEGATIVE  NEG      COCAINE NEGATIVE  NEG      METHADONE NEGATIVE  NEG      OPIATES NEGATIVE  NEG      PCP(PHENCYCLIDINE) NEGATIVE  NEG      THC (TH-CANNABINOL) NEGATIVE  NEG      DRUG SCRN COMMENT (NOTE)      Impression and Plan:   79 y.o. female with moderate to severe cognitive decline. 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 I think the pseudo dementia part is likely more prevalent. Being alone and non-social is also big issues in this scenario. She has noting at all to engage her time with.    I have discussed my impression at length with the patient. Majority time was spend counseling significant/pertinent things related to her each neurological complaint with daughter and Mrs. [place] and they seem to understand.   I counseled the need for socializing her. Group home will be better idea unless family has time for her.   Agree that she shouldn't drive at all.   Advised labelling her home and if possible remotely monitor her through devices.   I am getting her CT head and EEG. Her labs are fine.   Sending to counseling for behavioral issues and socialization.   Starting low dose namzaric for now.   FU after 4 months or early if the need arise.

## 2013-09-29 NOTE — Progress Notes (Signed)
eeg completed

## 2013-09-29 NOTE — Telephone Encounter (Signed)
Called to speak to [facility] not available. Spoke with Cordelia Pen that Dr. Swaziland will follow pt. Cordelia Pen will give Toney Reil the message.

## 2013-09-29 NOTE — Telephone Encounter (Signed)
Pls call Daisey w/[facility]    Will Dr. Swaziland follow in [facility]       Best number to reach [facility] [phone]

## 2013-09-30 NOTE — Procedures (Signed)
Name:      Gina Holloway, Gina Holloway            Admitted:   09/29/2013                                           DOB:        03-Oct-1934  Account #: [id number]                  Age         79  Ref MD:                                  Location:  DATE:      09/29/2013                              ELECTROENCEPHALOGRAM REPORT      REFERRING PHYSICIAN: Levie Heritage, MD.    DATE OF STUDY: 09/29/2013.    REASON FOR STUDY: Altered mental state and confusion.    ELECTROENCEPHALOGRAM DESCRIPTION: This is a routine 18-channel adult video  EEG recording with 1 channel devoted to limited EKG recording. Activation  procedures performed using the photic stimulation. Study is performed in  awake and asleep state.    The study opens up with sleep architecture, suggesting synchronous spindles  and occasional  K-complexes as the patient wakes up, the background rhythm of high theta  range slowing up to 7 Hz at the most. Driving was noted with the photic  stimulation in posterior leads. There is nothing to suggest electrographic  seizures or epileptiform discharges or a pathological asymmetry on this  study.    ELECTROENCEPHALOGRAPHIC INTERPRETATION: This EEG is borderline normal, with  very mild generalized slowing up to high theta ranges, and barely reaches 8  Hz frequency. There is nothing to suggest electrographic seizures,  epileptiform discharges or pathological asymmetry on this study.        Reviewed on 09/30/2013 6:44 AM              Levie Heritage, MD    cc:   Levie Heritage, MD      WS/wmx; D: 09/29/2013 05:36 P; T: 09/30/2013 04:24 A; DOC# [id number]; Job#  [id number]

## 2013-09-30 NOTE — Procedures (Signed)
Name:      Gina Holloway, Gina Holloway            Admitted:   09/29/2013                                           DOB:        06/07/1934  Account #: [id number]                  Age         79  Ref MD:                                  Location:  DATE:      09/29/2013                              ELECTROENCEPHALOGRAM REPORT      REFERRING PHYSICIAN: Kazi Montoro, MD.    DATE OF STUDY: 09/29/2013.    REASON FOR STUDY: Altered mental state and confusion.    ELECTROENCEPHALOGRAM DESCRIPTION: This is a routine 18-channel adult video  EEG recording with 1 channel devoted to limited EKG recording. Activation  procedures performed using the photic stimulation. Study is performed in  awake and asleep state.    The study opens up with sleep architecture, suggesting synchronous spindles  and occasional  K-complexes as the patient wakes up, the background rhythm of high theta  range slowing up to 7 Hz at the most. Driving was noted with the photic  stimulation in posterior leads. There is nothing to suggest electrographic  seizures or epileptiform discharges or a pathological asymmetry on this  study.    ELECTROENCEPHALOGRAPHIC INTERPRETATION: This EEG is borderline normal, with  very mild generalized slowing up to high theta ranges, and barely reaches 8  Hz frequency. There is nothing to suggest electrographic seizures,  epileptiform discharges or pathological asymmetry on this study.        Reviewed on 09/30/2013 6:44 AM              Onica Davidovich, MD    cc:   Addalyne Vandehei, MD      WS/wmx; D: 09/29/2013 05:36 P; T: 09/30/2013 04:24 A; DOC# [id number]; Job#  [id number]

## 2013-10-02 ENCOUNTER — Telehealth

## 2013-10-02 NOTE — Telephone Encounter (Signed)
Please advise

## 2013-10-02 NOTE — Telephone Encounter (Signed)
Pt daughter Rinaldo Cloud would like a call with the EMG and CT scan results. Rinaldo Cloud  can be reached at [phone]

## 2013-10-05 NOTE — Telephone Encounter (Signed)
Attempted to contact Neil Crouch by phone. No answer, unable to leave vm due to mailbox full.

## 2013-10-05 NOTE — Telephone Encounter (Signed)
Spoke with patients daughter Rinaldo Cloud and informed her of normal results. She is confused about her moms diagnosis. She wants to speak with you in regards to "Dementia", also a referral for neuropsych testing. Please call Rinaldo Cloud at [phone]

## 2013-10-05 NOTE — Telephone Encounter (Signed)
[facility] -    Requests a HH Aide twice a week for 4 wks & OT eval & treat   She feels Buspar & Paxil are not working   Patient reports brown urine     Best number to reach [facility] @ [phone]

## 2013-10-05 NOTE — Telephone Encounter (Signed)
Both tests; CT and EEG are normal. Please let her know

## 2013-10-05 NOTE — Telephone Encounter (Signed)
Per Dr. Joesph July get ua & c/s. Yes to [facility] Aid & OT. Patient to con't meds. This verbal orders called to [facility].

## 2013-10-06 NOTE — Telephone Encounter (Signed)
Order placed for neuropsych testing and faxed to Dr. Wynonia Hazard.

## 2013-10-06 NOTE — Telephone Encounter (Signed)
Called and discussed at length. Please refer her to neuropsych testing to [facility].

## 2013-10-13 ENCOUNTER — Encounter

## 2013-10-13 NOTE — Telephone Encounter (Signed)
[facility] -Alicia      Requests a sooner appt for patient as she will run out of meds       Power of [place] [phone]      Helmut Muster w/[facility] is [phone]

## 2013-10-13 NOTE — Telephone Encounter (Signed)
Order placed and faxed.

## 2013-10-13 NOTE — Telephone Encounter (Signed)
-----   Message from Janae L Gillyard sent at 10/09/2013  4:42 PM EDT -----  Regarding: Dr. Mauri [facility]) requests an order for a social worker visit for [facility]. (f) [phone] Attn: Clinical CoordDiona [phone].

## 2013-10-14 NOTE — Telephone Encounter (Signed)
Returned Alicia's call w/[facility] [phone]). Left message for her to call back with name of medication she is running out of.

## 2013-10-15 NOTE — Telephone Encounter (Signed)
Gina Holloway w/[facility] requesting an order -     Patient needs an order for a rolling walker       Best number to reach her is  [phone]  Gina Holloway

## 2013-10-16 ENCOUNTER — Encounter

## 2013-10-16 MED ORDER — ATORVASTATIN 40 MG TAB
40 mg | ORAL_TABLET | Freq: Every day | ORAL | Status: DC
Start: 2013-10-16 — End: 2013-12-09

## 2013-10-16 MED ORDER — PAROXETINE 20 MG TAB
20 mg | ORAL_TABLET | Freq: Every day | ORAL | Status: DC
Start: 2013-10-16 — End: 2013-12-09

## 2013-10-16 NOTE — Telephone Encounter (Signed)
Order faxed to [facility] [phone])

## 2013-10-19 NOTE — Progress Notes (Signed)
Chief Complaint   Patient presents with   ??? Extremity Weakness     Still having weakness in arms & legs

## 2013-10-20 NOTE — Progress Notes (Signed)
HISTORY OF PRESENT ILLNESS  Gina Holloway is a 79 y.o. female.  HPI   Follow-up on Depression and Anxiety.  Recent hospital stay at [facility] for depression.  Records are not available at time of the visit for review.  Pt is here with her dtg today who reports that pt is improved since being her inpt stay for medications adjustment and therapy.  She does not have follow up per pt with psychiatry.         Depression Review:  Patient is seen for followup of depression. Treatment includes Remeron, paxil and buspar. Ppt also has had some memory impairment per dtg.  Not sure of the outcome of eval in hospital.  She is living alone and independent with her ADLs.    Ongoing symptoms include depressed mood, insomnia and impaired memory. This has been chronic sx for over the past years.  She has been followed by psychiatry in the past.  She has not wanted to consider counseling.    She denies feelings of worthlessness/guilt and suicidal thoughts without plan.   HTN follow up:  Compliant w/ meds, low salt diet, and exercise.  No home bp monitoring.  No swelling, headache.  Hypercholesterolemia follow up:  Compliant w/ low fat, low cholesterol diet.  She is taking Lipitor.      Lab Results  Component Value Date/Time   WBC 4.3 06/03/2013 11:02 AM   HGB 13.8 06/03/2013 11:02 AM   HCT 42.8 06/03/2013 11:02 AM   PLATELET 256 06/03/2013 11:02 AM   MCV 90.1 06/03/2013 11:02 AM     Lab Results  Component Value Date/Time   CHOLESTEROL, TOTAL 152 06/03/2013 11:02 AM   HDL CHOLESTEROL 68 06/03/2013 11:02 AM   LDL, CALCULATED 57 06/03/2013 11:02 AM   TRIGLYCERIDE 135 06/03/2013 11:02 AM   CHOL/HDL RATIO 2.2 06/03/2013 11:02 AM     Lab Results  Component Value Date/Time   ALT 23 06/03/2013 11:02 AM   AST 26 06/03/2013 11:02 AM   ALK. PHOSPHATASE 66 06/03/2013 11:02 AM   BILIRUBIN, DIRECT 0.0 04/22/2007 01:46 PM   BILIRUBIN, TOTAL 0.3 06/03/2013 11:02 AM     Lab Results  Component Value Date/Time   GFR EST AA >60 06/03/2013 11:02 AM    GFR EST NON-AA >60 06/03/2013 11:02 AM   CREATININE 0.89 06/03/2013 11:02 AM   BUN 20 06/03/2013 11:02 AM   SODIUM 142 06/03/2013 11:02 AM   POTASSIUM 4.4 06/03/2013 11:02 AM   CHLORIDE 103 06/03/2013 11:02 AM   CO2 31 06/03/2013 11:02 AM      Lab Results  Component Value Date/Time   TSH 0.68 06/03/2013 11:02 AM   T4, FREE 1.32 05/06/2012 03:23 PM      Lab Results   Component Value Date/Time    HEMOGLOBIN A1C 6.0 10/13/2012 11:28 AM     Review of Systems   Constitutional: Negative for malaise/fatigue.   HENT: Negative for congestion.    Eyes: Negative for blurred vision.   Respiratory: Negative for cough and shortness of breath.    Cardiovascular: Negative for chest pain, palpitations and leg swelling.   Gastrointestinal: Negative for heartburn, abdominal pain and constipation.   Genitourinary: Negative for dysuria, urgency and frequency.   Musculoskeletal: Negative for back pain and joint pain.   Neurological: Negative for dizziness, tingling and headaches.   Endo/Heme/Allergies: Negative for environmental allergies.   Psychiatric/Behavioral: Negative for depression. The patient does not have insomnia.        Physical Exam  Constitutional: She appears well-developed and well-nourished.   BP 125/56 mmHg   Pulse 87   Temp(Src) 98.4 ??F (36.9 ??C) (Oral)   Resp 18   Ht 5' 3"  (1.6 m)   Wt 128 lb 9.6 oz (58.333 kg)   BMI 22.79 kg/m2   SpO2 97%   LMP 06/11/1990     HENT:   Right Ear: Tympanic membrane and ear canal normal.   Left Ear: Tympanic membrane and ear canal normal.   Nose: No mucosal edema or rhinorrhea.   Mouth/Throat: Oropharynx is clear and moist and mucous membranes are normal.   Neck: Normal range of motion. Neck supple. No thyromegaly present.   Cardiovascular: Normal rate and regular rhythm.    No murmur heard.  Pulmonary/Chest: Effort normal and breath sounds normal.   Abdominal: Soft. Bowel sounds are normal. There is no tenderness.   Musculoskeletal: Normal range of motion. She exhibits no edema.    Lymphadenopathy:     She has no cervical adenopathy.   Skin: Skin is warm and dry.   Psychiatric: Thought content normal. Her affect is blunt. Her speech is delayed. She is slowed. She exhibits a depressed mood.   Nursing note and vitals reviewed.      ASSESSMENT and PLAN  Gina Holloway was seen today for extremity weakness.    Diagnoses and associated orders for this visit:    Essential hypertension  Stable     Mixed hyperlipidemia  Continue to monitor.  Work on diet and exercise.    Dementia in conditions classified elsewhere without behavioral disturbance//  Mixed anxiety and depressive disorder  Dtg will schedule follow up with psych.      Encounter for long-term (current) use of other medications        Follow-up Disposition:  Return in about 1 month (around 11/18/2013).  reviewed diet, exercise and weight control  cardiovascular risk and specific lipid/LDL goals reviewed  reviewed medications and side effects in detail    I have discussed diagnosis listed in this note with pt and/or family. I have discussed treatment plans and options and the risk/benefit analysis of those options, including safe use of medications and possible medication side effects.   Through the use of shared decision making we have agreed to the above plan. The patient has received an after-visit summary and questions were answered concerning future plans and follow up.  Advise pt of any urgent changes then to proceed to the ER.

## 2013-11-13 NOTE — Telephone Encounter (Signed)
Dr Jordan-Sayles informed.

## 2013-11-13 NOTE — Telephone Encounter (Signed)
[facility] -     Patient has a hard time managing medications   Dtr has had a hard time reaching mother at times     She is being set up w/[facility] to help things       Best number to reach Besa is [phone]

## 2013-11-19 MED ORDER — BUSPIRONE 15 MG TAB
15 mg | ORAL_TABLET | Freq: Two times a day (BID) | ORAL | Status: AC
Start: 2013-11-19 — End: ?

## 2013-11-19 MED ORDER — LORATADINE 10 MG TAB
10 mg | ORAL_TABLET | Freq: Every day | ORAL | Status: AC
Start: 2013-11-19 — End: ?

## 2013-11-19 MED ORDER — ASPIRIN 81 MG TAB, DELAYED RELEASE
81 mg | ORAL_TABLET | Freq: Every day | ORAL | Status: AC
Start: 2013-11-19 — End: ?

## 2013-11-30 ENCOUNTER — Encounter: Attending: Family Medicine | Primary: Family Medicine

## 2013-12-03 NOTE — Telephone Encounter (Signed)
Returned patient's daughter's call; per Dr Joesph JulyJordan-Sayles, she does not know any physicians in the [place] area.

## 2013-12-03 NOTE — Telephone Encounter (Signed)
Rinaldo Cloudamela - dtr calling -   She has moved her mother and wondering if you would have a recommendation for a new MD in [place], [place] [id number]       Best number to reach her is [phone]

## 2013-12-08 ENCOUNTER — Encounter: Attending: Family Medicine | Primary: Family Medicine

## 2013-12-09 NOTE — Telephone Encounter (Signed)
-----   Message from Diona BrownerJanae L Gillyard sent at 12/09/2013  3:01 PM EST -----  Regarding: Dr.Jordan-Sayles/Refill   Pt needs Rx refills for all of her medications and she needs them called into her new preferred pharmacy [facility] located on  [address] (p) [phone]. Pt needs "Lipitor, Mirpadatine 15 mg tab, and  Paxil 20 tab" refilled as soon as possible. The best number [phone]

## 2013-12-10 MED ORDER — MIRTAZAPINE 15 MG TAB
15 mg | ORAL_TABLET | ORAL | Status: AC
Start: 2013-12-10 — End: ?

## 2013-12-10 MED ORDER — PAROXETINE 20 MG TAB
20 mg | ORAL_TABLET | Freq: Every day | ORAL | Status: AC
Start: 2013-12-10 — End: ?

## 2013-12-10 MED ORDER — ATORVASTATIN 40 MG TAB
40 mg | ORAL_TABLET | Freq: Every day | ORAL | Status: AC
Start: 2013-12-10 — End: ?

## 2013-12-24 NOTE — Telephone Encounter (Signed)
Patients daughter Rinaldo Cloudamela wants her mother to be seen for a CPE between 01/22/2014 thru 01/012016, she will be here with her daughter for the two weeks.   Please give her a call @ [phone]

## 2013-12-25 NOTE — Telephone Encounter (Signed)
Let her know no appt available for CPE and than I am also gone for a week for vacation during the holidays.

## 2013-12-25 NOTE — Telephone Encounter (Signed)
Appointment scheduled with Rica KoyanagiSherry Allgood, NP for 01/27/14 at 11:00 am per Dr Joesph JulyJordan-Sayles

## 2013-12-28 ENCOUNTER — Encounter: Attending: Family Medicine | Primary: Family Medicine

## 2013-12-30 ENCOUNTER — Encounter: Attending: Clinical Neuropsychologist | Primary: Family Medicine

## 2014-01-04 ENCOUNTER — Encounter: Attending: Neurology | Primary: Family Medicine

## 2014-01-27 ENCOUNTER — Ambulatory Visit: Attending: Family | Primary: Family Medicine

## 2014-01-27 ENCOUNTER — Ambulatory Visit: Admit: 2014-01-27 | Discharge: 2014-01-27 | Payer: MEDICARE | Attending: Family | Primary: Family Medicine

## 2014-01-27 DIAGNOSIS — F418 Other specified anxiety disorders: Secondary | ICD-10-CM

## 2014-01-27 LAB — AMB POC URINALYSIS DIP STICK AUTO W/ MICRO
Bilirubin (UA POC): NEGATIVE
Blood (UA POC): NEGATIVE
Glucose (UA POC): NEGATIVE
Ketones (UA POC): NEGATIVE
Nitrites (UA POC): NEGATIVE
Protein (UA POC): NEGATIVE mg/dL
Specific gravity (UA POC): 1.01 (ref 1.001–1.035)
pH (UA POC): 6.5 (ref 4.6–8.0)

## 2014-01-27 NOTE — Patient Instructions (Addendum)
Caring for a Person With Dementia: After Your Visit  Your Care Instructions  Dementia is a loss of mental skills that affects daily life. It is different from mild memory loss that occurs with aging. Dementia can cause problems with memory, thinking clearly, and planning. It is different for everyone. But it usually gets worse slowly. Some people who have dementia can function well for a long time. But at some point it may become hard for the person to care for himself or herself.  It can be upsetting to learn that a loved one has this condition. You may be afraid and worried about what will happen. You may wonder how you will care for the person. There is no cure for dementia. But medicine may be able to slow memory loss and improve thinking for a while. Other medicines may help with sleep, depression, and behavior changes.  Dementia is different for everyone. In some cases, people can function well for a long time. You can help your loved one by making his or her home life easier and safer. You also need to take care of yourself. Caregiving can be stressful. But support is available to help you and give you a break when you need it.  The Alzheimer's Association offers good information and support. If you are caring for someone with dementia, you can help make life safer and more comfortable. You can also help your loved one make decisions about future care. You may also want to bring up legal and financial issues. These are hard but important conversations to have.  Follow-up care is a key part of your loved one's treatment and safety. Be sure to make and go to all appointments, and call your doctor if your loved one is having problems. It's also a good idea to know your loved one's test results and keep a list of the medicines he or she takes.  How can you care for yourself at home?  Taking care of the person  ?? If the person takes medicine for dementia, help him or her take it  exactly as prescribed. Call the doctor if you notice any problems with the medicine.  ?? Make a list of the person's medicines. Review it with all of his or her doctors.  ?? Help the person eat a balanced diet. Serve plenty of whole grains, fruits, and vegetables every day. If the person is not hungry at mealtimes, give snacks at midmorning and in the afternoon. Offer drinks such as Boost, Ensure, or Sustacal if the person is losing weight.  ?? Encourage exercise. Walking and other activities may slow the decline of mental ability. Help the person stay active mentally with reading, crossword puzzles, or other hobbies.  ?? If the person has trouble sleeping, try not to let him or her nap during the day. Offer a glass of warm milk or caffeine-free herbal tea before bedtime.  ?? Develop a routine. Your loved one will feel less frustrated or confused with a clear, simple plan of what to do every day.  ?? Be patient. A task may take the person longer than it used to.  ?? For as long as he or she is able, allow your loved one to make decisions about activities, food, clothing, and other choices. Let him or her be independent, even if tasks take more time or are not done perfectly. Tailor tasks to the person's abilities. For example, if cooking is no longer safe, ask for other help. Your loved one   can help set the table, or make simple dishes such as a salad. When the person needs help, offer it gently.  Staying safe  ?? Make your home (or your loved one's home) safe. Tack down rugs, and put no-slip tape in the tub. Install handrails, and put safety switches on stoves and appliances. Keep rooms free of clutter. Make sure walkways around furniture are clear. Do not move furniture around, because the person may become confused.  ?? Use locks on doors and cupboards. Lock up knives, scissors, medicines, cleaning supplies, and other dangerous things.  ?? Do not let the person drive or cook if he or she can't do it safely. A  person with dementia should not drive unless he or she is able to pass an on-road driving test. Your state driver's license bureau can do a driving test if there is any question.  ?? Get medical alert jewelry for the person so that you can be contacted if he or she wanders away. If possible, provide a safe place for wandering, such as an enclosed yard or garden.  Taking care of yourself  ?? Ask your doctor about support groups and other resources in your area.  ?? Take care of your health. Be sure to eat healthy foods and get enough rest and exercise.  ?? Take time for yourself. Respite services provide someone to stay with the person for a short time while you get out of the house for a few hours.  ?? Make time for an activity that you enjoy. Read, listen to music, paint, do crafts, or play an instrument, even if it's only for a few minutes a day.  ?? Spend time with family, friends, and others in your support system.  When should you call for help?  Call 911 anytime you think the person may need emergency care. For example, call if:  ?? The person who has dementia wanders away and you can't find him or her.  ?? The person who has dementia is seriously injured.  Call the doctor now or seek immediate medical care if:  ?? The person suddenly sees things that are not there (hallucinates).  ?? The person has a sudden change in his or her behavior.  Watch closely for changes in the person's health, and be sure to contact the doctor if:  ?? The person has symptoms that could cause injury.  ?? The person has problems with his or her medicine.  ?? You need more information to care for a person with dementia.  ?? You need respite care so you can take a break.   Where can you learn more?   Go to [employer]  Enter B382 in the search box to learn more about "Caring for a Person With Dementia: After Your Visit."   ?? 2006-2015 [employer]wise, [employer]. Care instructions adapted under  license by [employer] (which disclaims liability or warranty for this information). This care instruction is for use with your licensed healthcare professional. If you have questions about a medical condition or this instruction, always ask your healthcare professional. Healthwise, Incorporated disclaims any warranty or liability for your use of this information.  Content Version: 10.5.422740; Current as of: December 19, 2012              Anxiety Disorder: After Your Visit  Your Care Instructions  Anxiety is a normal reaction to stress. Difficult situations can cause you to have symptoms such as sweaty palms and a nervous feeling.  In an  anxiety disorder, the symptoms are far more severe. Constant worry, muscle tension, trouble sleeping, nausea and diarrhea, and other symptoms can make normal daily activities difficult or impossible. These symptoms may occur for no reason, and they can affect your work, school, or social life. Medicines, counseling, and self-care can all help.  Follow-up care is a key part of your treatment and safety. Be sure to make and go to all appointments, and call your doctor if you are having problems. It's also a good idea to know your test results and keep a list of the medicines you take.  How can you care for yourself at home?  ?? Take medicines exactly as directed. Call your doctor if you think you are having a problem with your medicine.  ?? Go to your counseling sessions and follow-up appointments.  ?? Recognize and accept your anxiety. Then, when you are in a situation that makes you anxious, say to yourself, "This is not an emergency. I feel uncomfortable, but I am not in danger. I can keep going even if I feel anxious."  ?? Be kind to your body:  ?? Relieve tension with exercise or a massage.  ?? Get enough rest.  ?? Avoid alcohol, caffeine, nicotine, and illegal drugs. They can increase your anxiety level and cause sleep problems.   ?? Learn and do relaxation techniques. See below for more about these techniques.  ?? Engage your mind. Get out and do something you enjoy. Go to a funny movie, or take a walk or hike. Plan your day. Having too much or too little to do can make you anxious.  ?? Keep a record of your symptoms. Discuss your fears with a good friend or family member, or join a support group for people with similar problems. Talking to others sometimes relieves stress.  ?? Get involved in social groups, or volunteer to help others. Being alone sometimes makes things seem worse than they are.  ?? Get at least 30 minutes of exercise on most days of the week to relieve stress. Walking is a good choice. You also may want to do other activities, such as running, swimming, cycling, or playing tennis or team sports.  Relaxation techniques  Do relaxation exercises 10 to 20 minutes a day. You can play soothing, relaxing music while you do them, if you wish.  ?? Tell others in your house that you are going to do your relaxation exercises. Ask them not to disturb you.  ?? Find a comfortable place, away from all distractions and noise.  ?? Lie down on your back, or sit with your back straight.  ?? Focus on your breathing. Make it slow and steady.  ?? Breathe in through your nose. Breathe out through either your nose or mouth.  ?? Breathe deeply, filling up the area between your navel and your rib cage. Breathe so that your belly goes up and down.  ?? Do not hold your breath.  ?? Breathe like this for 5 to 10 minutes. Notice the feeling of calmness throughout your whole body.  As you continue to breathe slowly and deeply, relax by doing the following for another 5 to 10 minutes:  ?? Tighten and relax each muscle group in your body. You can begin at your toes and work your way up to your head.  ?? Imagine your muscle groups relaxing and becoming heavy.  ?? Empty your mind of all thoughts.  ?? Let yourself relax more and more deeply.   ??  Become aware of the state of calmness that surrounds you.  ?? When your relaxation time is over, you can bring yourself back to alertness by moving your fingers and toes and then your hands and feet and then stretching and moving your entire body. Sometimes people fall asleep during relaxation, but they usually wake up shortly afterward.  ?? Always give yourself time to return to full alertness before you drive a car or do anything that might cause an accident if you are not fully alert. Never play a relaxation tape while you drive a car.  When should you call for help?  Call 911 anytime you think you may need emergency care. For example, call if:  ?? You feel you cannot stop from hurting yourself or someone else.  Watch closely for changes in your health, and be sure to contact your doctor if:  ?? You have anxiety or fear that affects your life.  ?? You have symptoms of anxiety that are new or different from those you had before.   Where can you learn more?   Go to [email or web address][email or web address]://www.healthwise.net/[facility]  Enter P754 in the search box to learn more about "Anxiety Disorder: After Your Visit."   ?? 2006-2015 Healthwise, Incorporated. Care instructions adapted under license by [employer] (which disclaims liability or warranty for this information). This care instruction is for use with your licensed healthcare professional. If you have questions about a medical condition or this instruction, always ask your healthcare professional. Healthwise, Incorporated disclaims any warranty or liability for your use of this information.  Content Version: 10.5.422740; Current as of: December 19, 2012

## 2014-01-27 NOTE — Progress Notes (Addendum)
HISTORY OF PRESENT ILLNESS  Gina Holloway is a 79 y.o. female.  HPI  Patient comes in today for anxiety.  States she feels nervous and shaking all the time.  States that feels heart beating fast at times.  Some days are worse than others.  Mornings are typically worse for patient.  Patient states she lives with daughter in [place].  Patient states she manages her own medications.  Memory varies.  Unsure about health maintenance.  Walks outdoors, walks in senior center.  Feels weak in knees.  Eating okay.  Appetite good.  Drinking water, should drink more.  Yesterday ate oatmeal and toast, egg, bacon for breakfast.  Lunch had spaghetti.  Dinner: ate dessert for dinner, fruit.  States depression stable.  Denies SI, HI.  Patient states she goes to senior center twice weekly, does not like to go, but goes anyway.  Sometimes she enjoys self, sometimes it is "boring".  When she does not go to center, she watches TV, goes outside.  Has a neighbor friend she sees couple times week.  Goes to church often.  Staying with sister for holidays.  Today, she will be visiting another sister and will be using exercise room.  Nose feels stopped up when sitting up.  Some watery eyes.  Allergies   Allergen Reactions   ??? Biaxin [Clarithromycin] Unknown (comments)     Pt not sure what type of reaction   ??? Flagyl [Metronidazole] Unknown (comments)     Pt not sure of what type of reaction       Past Medical History   Diagnosis Date   ??? HTN (hypertension) 03/29/2008   ??? Hypercholesterolemia 03/29/2008   ??? GERD (gastroesophageal reflux disease) 03/29/2008   ??? Thyroid goiter 03/29/2008   ??? Osteoporosis 03/29/2008   ??? Arrhythmia    ??? Murmur    ??? Other chest pain    ??? Reflux esophagitis    ??? Anxiety    ??? Essential hypertension    ??? Depression    ??? Dementia        Past Surgical History   Procedure Laterality Date   ??? Hx cholecystectomy     ??? Hx tubal ligation     ??? Pr colonoscopy,diagnostic  02/18/2006     Dr Park Breed       History     Social History    ??? Marital Status: DIVORCED     Spouse Name: N/A     Number of Children: N/A   ??? Years of Education: N/A     Occupational History   ??? Not on file.     Social History Main Topics   ??? Smoking status: Never Smoker    ??? Smokeless tobacco: Never Used   ??? Alcohol Use: No   ??? Drug Use: No   ??? Sexual Activity: No     Other Topics Concern   ??? Not on file     Social History Narrative    ** Merged History Encounter **            Family History   Problem Relation Age of Onset   ??? Heart Disease Sister    ??? Arthritis-osteo Sister        Current Outpatient Prescriptions   Medication Sig   ??? mirtazapine (REMERON) 15 mg tablet TAKE 1 TABLET BY MOUTH EVERY NIGHT AT BEDTIME   ??? busPIRone (BUSPAR) 15 mg tablet Take 1 Tab by mouth two (2) times a day.   ??? vitamin  c-vitamin e (CRANBERRY CONCENTRATE) cap Take  by mouth.   ??? atorvastatin (LIPITOR) 40 mg tablet Take 1 Tab by mouth daily.   ??? PARoxetine (PAXIL) 20 mg tablet Take 1 Tab by mouth daily.   ??? loratadine (CLARITIN) 10 mg tablet Take 1 Tab by mouth daily.   ??? aspirin delayed-release 81 mg tablet Take 1 Tab by mouth daily.     No current facility-administered medications for this visit.     Review of Systems   Constitutional: Negative for fever, chills, weight loss, malaise/fatigue and diaphoresis.   HENT: Positive for congestion (rhinorrhea). Negative for ear pain, sore throat and tinnitus.    Respiratory: Negative for cough and shortness of breath.    Cardiovascular: Negative for chest pain, palpitations and leg swelling.   Gastrointestinal: Negative for nausea, vomiting, abdominal pain, diarrhea, constipation, blood in stool and melena.        Appetite good   Genitourinary: Negative for dysuria, urgency, frequency, hematuria and flank pain.   Musculoskeletal: Negative for myalgias.   Skin: Negative.    Neurological: Positive for weakness. Negative for dizziness, tingling, sensory change, speech change, focal weakness, seizures and headaches.    Psychiatric/Behavioral: Positive for depression (stable per patient) and memory loss. Negative for suicidal ideas. The patient is nervous/anxious. The patient does not have insomnia.      Visit Vitals   Item Reading   ??? BP 124/62 mmHg   ??? Pulse 79   ??? Temp(Src) 97.9 ??F (36.6 ??C) (Oral)   ??? Resp 18   ??? Ht 5\' 3"  (1.6 m)   ??? Wt 130 lb (58.968 kg)   ??? BMI 23.03 kg/m2   ??? SpO2 100%     Physical Exam   Constitutional: She is oriented to person, place, and time. Vital signs are normal. She appears well-developed and well-nourished. She is cooperative.   HENT:   Right Ear: Hearing, tympanic membrane, external ear and ear canal normal.   Left Ear: Hearing, tympanic membrane, external ear and ear canal normal.   Nose: Mucosal edema and rhinorrhea present. Right sinus exhibits no maxillary sinus tenderness and no frontal sinus tenderness. Left sinus exhibits no maxillary sinus tenderness and no frontal sinus tenderness.   Mouth/Throat: Uvula is midline, oropharynx is clear and moist and mucous membranes are normal.   Neck: Carotid bruit is not present. No thyromegaly present.   Cardiovascular: Normal rate, regular rhythm, S1 normal and S2 normal.    Murmur heard.   Systolic murmur is present   Pulmonary/Chest: Effort normal and breath sounds normal. She has no decreased breath sounds. She has no wheezes. She has no rhonchi. She has no rales.   Abdominal: Soft. Normal appearance and bowel sounds are normal. There is no hepatosplenomegaly. There is no tenderness. There is no CVA tenderness.   Lymphadenopathy:        Head (right side): No submental, no submandibular, no tonsillar, no preauricular and no posterior auricular adenopathy present.        Head (left side): No submental, no submandibular, no tonsillar, no preauricular and no posterior auricular adenopathy present.     She has no cervical adenopathy.        Right: No supraclavicular adenopathy present.        Left: No supraclavicular adenopathy present.    Neurological: She is alert and oriented to person, place, and time.   Skin: Skin is warm, dry and intact.   Psychiatric: Her speech is normal and behavior is normal. Thought  content normal. Her mood appears anxious. Cognition and memory are impaired.   MMSE 21/30   Vitals reviewed.    ASSESSMENT and PLAN    ICD-10-CM ICD-9-CM    1. Mixed anxiety and depressive disorder F41.8 300.4 CBC WITH AUTOMATED DIFF      METABOLIC PANEL, COMPREHENSIVE      AMB POC URINALYSIS DIP STICK AUTO W/ MICRO   2. Dementia due to medical condition without behavioral disturbance F02.80 294.10 CBC WITH AUTOMATED DIFF      METABOLIC PANEL, COMPREHENSIVE      AMB POC URINALYSIS DIP STICK AUTO W/ MICRO      VITAMIN D, 25 HYDROXY     Encounter Diagnoses   Name Primary?   ??? Mixed anxiety and depressive disorder Yes   ??? Dementia due to medical condition without behavioral disturbance      Orders Placed This Encounter   ??? CBC WITH AUTOMATED DIFF   ??? METABOLIC PANEL, COMPREHENSIVE   ??? VITAMIN D, 25 HYDROXY   ??? AMB POC URINALYSIS DIP STICK AUTO W/ MICRO     Talbert ForestShirley was seen today for anxiety.    Diagnoses and all orders for this visit:    Mixed anxiety and depressive disorder  Orders:  -     AMB POC URINALYSIS DIP STICK AUTO W/ MICRO  -     Spent 15 minutes on phone discussing patient with daughter, Neil Crouchamela Adams, who is on HIPAA form.  Rinaldo Cloudamela states patient has been living with her for 2 months and feels like patient has made good progress.  States that patient walks around block without her cane most days, does squats for exercise, and goes to senior center 5 days per week.  Daughter states patient gets in and out of car by herself.  Daughter feels as if patient's anxiety and nervousness increases around certain family members, and feels like when family members "baby her", she tends to regress.  Daughter states she has not been giving mother Paxil, states it causes dizziness.  States she stopped medication, dizziness resolved, and when medication  restarted, dizziness returned.  Daughter feels like patient has been stable on Buspar and Remeron.  Has not scheduled patient to see psychiatry in [place] - encouraged daughter to schedule appointment.  MMSE slightly improved over previous one in chart.  Weight stable.  Will not make medication changes today at daughter's request.  Encouraged follow up with psych.  Will forward visit to Dr. Joesph JulyJordan-Sayles, patient's PCP.    Dementia due to medical condition without behavioral disturbance  Orders:  -     AMB POC URINALYSIS DIP STICK AUTO W/ MICRO  -     VITAMIN D, 25 HYDROXY    Essential HTN  Orders:  -     CBC WITH AUTOMATED DIFF  -     METABOLIC PANEL, COMPREHENSIVE        Follow-up Disposition:  Return in about 4 months (around 05/29/2014).  lab results and schedule of future lab studies reviewed with patient    I have reviewed the patient's allergies and made any necessary changes. Medical, procedural, social and family histories have been reviewed and updated as medically indicated. I have reconciled and/or revised patient medications in the EMR.       I have discussed each diagnosis listed in this note with Devota PaceShirley H Nofziger and/or their family. I have discussed treatment options and the risk/benefit analysis of those options, including safe use of medications and possible medication side effects.  Through the use of shared decision making we have agreed to the above plan.      The patient has received an after-visit summary and questions were answered concerning future plans.     Jodine Muchmore C. Kynli Chou, FNP-C    This note will not be viewable in MyChart.

## 2014-01-28 LAB — METABOLIC PANEL, COMPREHENSIVE
A-G Ratio: 1.4 (ref 1.1–2.5)
ALT (SGPT): 13 IU/L (ref 0–32)
AST (SGOT): 23 IU/L (ref 0–40)
Albumin: 4 g/dL (ref 3.5–4.8)
Alk. phosphatase: 73 IU/L (ref 39–117)
BUN/Creatinine ratio: 16 (ref 11–26)
BUN: 16 mg/dL (ref 8–27)
Bilirubin, total: 0.3 mg/dL (ref 0.0–1.2)
CO2: 28 mmol/L (ref 18–29)
Calcium: 9.6 mg/dL (ref 8.7–10.3)
Chloride: 100 mmol/L (ref 97–108)
Creatinine: 0.97 mg/dL (ref 0.57–1.00)
GFR est AA: 65 mL/min/{1.73_m2} (ref >59)
GFR est non-AA: 57 mL/min/{1.73_m2} — ABNORMAL LOW (ref >59)
GLOBULIN, TOTAL: 2.8 g/dL (ref 1.5–4.5)
Glucose: 89 mg/dL (ref 65–99)
Potassium: 4.2 mmol/L (ref 3.5–5.2)
Protein, total: 6.8 g/dL (ref 6.0–8.5)
Sodium: 142 mmol/L (ref 134–144)

## 2014-01-28 LAB — CBC WITH AUTOMATED DIFF
ABS. BASOPHILS: 0 10*3/uL (ref 0.0–0.2)
ABS. EOSINOPHILS: 0.1 10*3/uL (ref 0.0–0.4)
ABS. IMM. GRANS.: 0 10*3/uL (ref 0.0–0.1)
ABS. MONOCYTES: 0.4 10*3/uL (ref 0.1–0.9)
ABS. NEUTROPHILS: 1.9 10*3/uL (ref 1.4–7.0)
Abs Lymphocytes: 1.8 10*3/uL (ref 0.7–3.1)
BASOPHILS: 1 %
EOSINOPHILS: 2 %
HCT: 41.6 % (ref 34.0–46.6)
HGB: 13.9 g/dL (ref 11.1–15.9)
IMMATURE GRANULOCYTES: 0 %
Lymphocytes: 42 %
MCH: 28.9 pg (ref 26.6–33.0)
MCHC: 33.4 g/dL (ref 31.5–35.7)
MCV: 87 fL (ref 79–97)
MONOCYTES: 11 %
NEUTROPHILS: 44 %
PLATELET: 253 10*3/uL (ref 150–379)
RBC: 4.81 x10E6/uL (ref 3.77–5.28)
RDW: 13.5 % (ref 12.3–15.4)
WBC: 4.2 10*3/uL (ref 3.4–10.8)

## 2014-01-28 LAB — VITAMIN D, 25 HYDROXY: VITAMIN D, 25-HYDROXY: 35.9 ng/mL (ref 30.0–100.0)

## 2014-01-28 LAB — CKD REPORT

## 2014-03-24 ENCOUNTER — Encounter (HOSPITAL_COMMUNITY): Payer: Self-pay | Admitting: Emergency Medicine

## 2014-03-24 ENCOUNTER — Emergency Department (HOSPITAL_COMMUNITY): Payer: Medicare Other

## 2014-03-24 ENCOUNTER — Emergency Department (HOSPITAL_COMMUNITY)
Admission: EM | Admit: 2014-03-24 | Discharge: 2014-03-24 | Disposition: A | Discharge status: Home / Self Care | Payer: Medicare Other | Attending: Emergency Medicine | Admitting: Emergency Medicine

## 2014-03-24 DIAGNOSIS — Y998 Other external cause status: Secondary | ICD-10-CM | POA: Insufficient documentation

## 2014-03-24 DIAGNOSIS — W1839XA Other fall on same level, initial encounter: Secondary | ICD-10-CM | POA: Diagnosis not present

## 2014-03-24 DIAGNOSIS — J3489 Other specified disorders of nose and nasal sinuses: Secondary | ICD-10-CM | POA: Insufficient documentation

## 2014-03-24 DIAGNOSIS — Z043 Encounter for examination and observation following other accident: Secondary | ICD-10-CM | POA: Insufficient documentation

## 2014-03-24 DIAGNOSIS — Y9289 Other specified places as the place of occurrence of the external cause: Secondary | ICD-10-CM | POA: Diagnosis not present

## 2014-03-24 DIAGNOSIS — Z8639 Personal history of other endocrine, nutritional and metabolic disease: Secondary | ICD-10-CM | POA: Insufficient documentation

## 2014-03-24 DIAGNOSIS — Y9389 Activity, other specified: Secondary | ICD-10-CM | POA: Insufficient documentation

## 2014-03-24 DIAGNOSIS — F32A Depression, unspecified: Secondary | ICD-10-CM

## 2014-03-24 DIAGNOSIS — F419 Anxiety disorder, unspecified: Secondary | ICD-10-CM | POA: Insufficient documentation

## 2014-03-24 DIAGNOSIS — Z87891 Personal history of nicotine dependence: Secondary | ICD-10-CM | POA: Diagnosis not present

## 2014-03-24 DIAGNOSIS — W19XXXA Unspecified fall, initial encounter: Secondary | ICD-10-CM

## 2014-03-24 DIAGNOSIS — F329 Major depressive disorder, single episode, unspecified: Secondary | ICD-10-CM | POA: Diagnosis not present

## 2014-03-24 HISTORY — DX: Major depressive disorder, single episode, unspecified: F32.9

## 2014-03-24 HISTORY — DX: Anxiety disorder, unspecified: F41.9

## 2014-03-24 HISTORY — DX: Depression, unspecified: F32.A

## 2014-03-24 LAB — CBC WITH DIFFERENTIAL/PLATELET
Basophils Absolute: 0 10*3/uL (ref 0.0–0.1)
Basophils Relative: 1 % (ref 0–1)
Eosinophils Absolute: 0.1 10*3/uL (ref 0.0–0.7)
Eosinophils Relative: 3 % (ref 0–5)
HCT: 38.4 % (ref 36.0–46.0)
Hemoglobin: 12.3 g/dL (ref 12.0–15.0)
LYMPHS ABS: 1.4 10*3/uL (ref 0.7–4.0)
LYMPHS PCT: 31 % (ref 12–46)
MCH: 28.6 pg (ref 26.0–34.0)
MCHC: 32 g/dL (ref 30.0–36.0)
MCV: 89.3 fL (ref 78.0–100.0)
Monocytes Absolute: 0.5 10*3/uL (ref 0.1–1.0)
Monocytes Relative: 12 % (ref 3–12)
Neutro Abs: 2.4 10*3/uL (ref 1.7–7.7)
Neutrophils Relative %: 53 % (ref 43–77)
Platelets: 198 10*3/uL (ref 150–400)
RBC: 4.3 MIL/uL (ref 3.87–5.11)
RDW: 12.6 % (ref 11.5–15.5)
WBC: 4.4 10*3/uL (ref 4.0–10.5)

## 2014-03-24 LAB — URINALYSIS, ROUTINE W REFLEX MICROSCOPIC
Bilirubin Urine: NEGATIVE
Glucose, UA: NEGATIVE mg/dL
Hgb urine dipstick: NEGATIVE
KETONES UR: NEGATIVE mg/dL
NITRITE: NEGATIVE
PH: 7 (ref 5.0–8.0)
Protein, ur: NEGATIVE mg/dL
Specific Gravity, Urine: 1.02 (ref 1.005–1.030)
UROBILINOGEN UA: 1 mg/dL (ref 0.0–1.0)

## 2014-03-24 LAB — URINE MICROSCOPIC-ADD ON

## 2014-03-24 LAB — COMPREHENSIVE METABOLIC PANEL
ALBUMIN: 3.2 g/dL — AB (ref 3.5–5.2)
ALT: 13 U/L (ref 0–35)
AST: 28 U/L (ref 0–37)
Alkaline Phosphatase: 55 U/L (ref 39–117)
Anion gap: 4 — ABNORMAL LOW (ref 5–15)
BILIRUBIN TOTAL: 0.7 mg/dL (ref 0.3–1.2)
BUN: 13 mg/dL (ref 6–23)
CALCIUM: 9.4 mg/dL (ref 8.4–10.5)
CO2: 30 mmol/L (ref 19–32)
Chloride: 108 mmol/L (ref 96–112)
Creatinine, Ser: 1.08 mg/dL (ref 0.50–1.10)
GFR calc Af Amer: 56 mL/min — ABNORMAL LOW (ref >90)
GFR, EST NON AFRICAN AMERICAN: 48 mL/min — AB (ref >90)
Glucose, Bld: 82 mg/dL (ref 70–99)
Potassium: 4.4 mmol/L (ref 3.5–5.1)
Sodium: 142 mmol/L (ref 135–145)
Total Protein: 6 g/dL (ref 6.0–8.3)

## 2014-03-24 MED ORDER — MIRTAZAPINE 30 MG PO TABS
30.0000 mg | ORAL_TABLET | Freq: Every day | ORAL | Status: DC
Start: 2014-03-24 — End: 2014-08-05

## 2014-03-24 NOTE — Discharge Instructions (Signed)
Outpatient Psychiatry and Counseling  Therapeutic Alternatives: Mobile Crisis Management 24 hours:  [phone]  [facility] sliding scale fee and walk in schedule: M-F 8am-12pm/1pm-3pm [address]  [place], [place] [address] [phone]  [facility] [address] [place], [place] [address] [phone]  [facility] new patient walk-in appointments available Monday - Friday 8am -3pm.          [address] [place], [place] [address] [phone] or crisis line- [phone]  [facility]/ Intensive Outpatient Therapy Program [address] [place], [place] [address] [phone]  [facility]                  Crisis Services      [phone]      [address]     [place], [place] [address]                 [facility]   [facility] [phone] [address] [place], [place] [address]   [facility]          [address] [place],  [place], [place] [address]       [phone]  [facility] [address] [place], [place] [address] [phone]  [facility]    [address]    [place], [place] [address]     [phone]       Andee PolesParish McKinney, MD     [address]     [place] [address]     [phone]       [facility] [address] [place] [address]  [facility]     [address]     [place], [place]      [phone]       [facility] Eulogio DitchShamsher Ahluwalia, MD [address] [place], [place] [address] [phone]  [facility]     [address]     [place], [place] [address]     [phone]       [facility] [address] [place], [place] [address] [phone] Resources for Temporary  Residential Assistance/Crisis Centers  [facility]) M-F 8am-3pm   [address] [place], [place] [address]   [phone] Services include: laundry, barbering, support groups, case management, phone  & computer access, showers, AA/NA mtgs, mental health/substance abuse nurse, job skills class, disability information, VA assistance, spiritual classes, etc.   HOMELESS SHELTERS  [facility]     [facility]   [address], [place]     [phone]              [facility] (women and children)       [address] [place], [place] [address] [phone] [email or web address] for application and process Application Required  [facility]   [address]    [place] [address]     [phone]                    [facility] [address] [place], [place] [address] [phone](schedule application appt.) Application Required  [facility] (women only)    [address]     [place], [place]  [address]     [phone]      Intake starts 6pm daily Need valid ID, SSC, & Police report [employer] [address] [place], [place] [phone] Application Required  [facility] (men only)     [address]      [place], [place]     [phone]       [facility] [place] (Pregnant women only) [address] [place], [place] [phone]  [facility]      [address]      [place], [place] [address]     [phone]             [facility] [address] [place], [place] [phone] 90 day commitment/SA/Application process  [facility](men only)     [address]     [place], [place]     [phone]       Check-in at [facility] [address] [place], [place] [address] [phone] Men/Women/Women and Children  must be there by 7 pm  [facility] [place], [place] [phone]                  Depression Depression refers to feeling sad, low, down in the dumps, blue, gloomy, or empty. In general, there are two kinds of depression:  Normal sadness or normal grief. This kind of depression is one that we all feel from time to time after upsetting life experiences, such as the loss of a job or the ending of a relationship. This kind of depression is considered normal, is short lived, and resolves within a few days to 2 weeks. Depression experienced after the loss of a loved one (bereavement) often lasts longer than 2 weeks but normally gets better with time.  Clinical depression. This kind of depression lasts longer than normal sadness or normal grief or interferes with your ability to function at home, at work, and in school. It also interferes with your personal relationships. It affects almost every aspect of your life. Clinical depression is an illness. Symptoms of depression can also be caused by conditions other than those mentioned above, such as:  Physical illness. Some physical illnesses, including underactive thyroid gland (hypothyroidism), severe anemia, specific types of cancer, diabetes, uncontrolled seizures, heart and lung problems, strokes, and chronic pain are commonly associated with symptoms of depression.  Side effects of some prescription medicine. In some people, certain types of medicine can cause symptoms of depression.  Substance abuse. Abuse of alcohol and illicit drugs can cause symptoms of depression. SYMPTOMS Symptoms of normal sadness and normal grief include the following:  Feeling sad or crying for short periods of time.  Not caring about anything (apathy).  Difficulty sleeping or sleeping too much.  No longer able to enjoy the things you used to enjoy.  Desire to be by oneself all the time (social isolation).  Lack of energy or motivation.  Difficulty  concentrating or remembering.  Change in appetite or weight.  Restlessness or agitation. Symptoms of clinical depression include the same symptoms of normal sadness or normal grief and also the following symptoms:  Feeling sad or crying all the time.  Feelings of guilt or worthlessness.  Feelings of hopelessness or helplessness.  Thoughts of suicide or the desire to harm yourself (suicidal ideation).  Loss of touch with reality (psychotic symptoms).  Seeing or hearing things that are not real (hallucinations) or having false beliefs about your life or the people around you (delusions and paranoia). DIAGNOSIS  The diagnosis of clinical depression is usually based on how bad the symptoms are and how long they have lasted. Your health care provider will also ask you questions about your medical history and substance use to find out if physical illness, use of prescription medicine, or substance abuse is causing your depression. Your health care provider may also order blood tests. TREATMENT  Often, normal sadness and normal grief do not require treatment. However, sometimes antidepressant medicine is given for bereavement to ease the depressive symptoms until they resolve. The treatment for clinical depression depends on how bad the symptoms are but often includes antidepressant medicine, counseling with a mental health professional, or both. Your health care provider will help to determine what treatment is best for you. Depression caused by physical illness usually goes away with appropriate medical treatment of the illness. If prescription medicine is causing depression, talk with your health care provider about stopping the medicine, decreasing the dose, or changing to another medicine. Depression caused by the abuse of alcohol or illicit drugs goes away when you stop using these substances. Some adults need professional help in order to stop drinking or using drugs. SEEK IMMEDIATE MEDICAL  CARE IF:  You have thoughts about hurting yourself or others.  You lose touch with reality (have psychotic symptoms).  You are taking medicine for depression and have a serious side effect. FOR MORE INFORMATION  National Alliance on Mental Illness: www.nami.[employer] of Mental Health: http://www.maynard.net/ Document Released: 01/20/2000 Document Revised: 06/08/2013 Document Reviewed: 04/23/2011 [facility] Patient Information 2015 [place], [place]. This information is not intended to replace advice given to you by your health care provider. Make sure you discuss any questions you have with your health care provider. Generalized Anxiety Disorder Generalized anxiety disorder (GAD) is a mental disorder. It interferes with life functions, including relationships, work, and school. GAD is different from normal anxiety, which everyone experiences at some point in their lives in response to specific life events and activities. Normal anxiety actually helps [place] prepare for and get through these life events and activities. Normal anxiety goes away after the event or activity is over.  GAD causes anxiety that is not necessarily related to specific events or activities. It also causes excess anxiety in proportion to specific events or activities. The anxiety associated with GAD is also difficult to control. GAD can vary from mild to severe. People with severe GAD can have intense waves of anxiety with physical symptoms (panic attacks).  SYMPTOMS The anxiety and worry associated with GAD are difficult to control. This anxiety and worry are related to many life events and activities and also occur more days than not for 6 months or longer. People with GAD also have three or more of the following symptoms (one or more in children):  Restlessness.   Fatigue.  Difficulty concentrating.   Irritability.  Muscle tension.  Difficulty sleeping or unsatisfying sleep. DIAGNOSIS GAD is diagnosed  through an assessment by your health care provider. Your health care provider will ask you questions aboutyour mood,physical symptoms, and events in your life. Your health care provider may ask you about your medical history and use of alcohol or drugs, including prescription medicines. Your health care provider may also do a physical exam and blood tests. Certain medical conditions and the use of certain substances can cause symptoms similar  to those associated with GAD. Your health care provider may refer you to a mental health specialist for further evaluation. TREATMENT The following therapies are usually used to treat GAD:   Medication. Antidepressant medication usually is prescribed for long-term daily control. Antianxiety medicines may be added in severe cases, especially when panic attacks occur.   Talk therapy (psychotherapy). Certain types of talk therapy can be helpful in treating GAD by providing support, education, and guidance. A form of talk therapy called cognitive behavioral therapy can teach you healthy ways to think about and react to daily life events and activities.  Stress managementtechniques. These include yoga, meditation, and exercise and can be very helpful when they are practiced regularly. A mental health specialist can help determine which treatment is best for you. Some people see improvement with one therapy. However, other people require a combination of therapies. Document Released: 05/19/2012 Document Revised: 06/08/2013 Document Reviewed: 05/19/2012 [facility] Patient Information 2015 [place], [place]. This information is not intended to replace advice given to you by your health care provider. Make sure you discuss any questions you have with your health care provider.

## 2014-03-24 NOTE — ED Notes (Signed)
Pt c/o anxiety that has increased in the past 4 years. Pt is taking a medication everyday w/o relief. Pt sts she has had an increase in falling since yesterday denies any injury. Pt sts she gets really shaky and falls from shaking so much. Pt A&O. Pt was seen in senior living center shaking and nurses were concerned and called 911. Pt sts daugther doesn't believe anything is wrong with her.

## 2014-03-24 NOTE — ED Provider Notes (Signed)
Pt seen and evaluated.  Reports non specific symptoms for several weeks to months.  Sad affect, poor eye contact.  Not SI on TSS eval.  Will Increase Remeron.  Given multiple outpatient FU op[tions by [facility].  Rolland PorterMark Daemian Gahm, MD 03/24/14 [phone]

## 2014-03-24 NOTE — BH Assessment (Signed)
Writer informed TTS Charm Barges([place]) of the consult.

## 2014-03-24 NOTE — ED Provider Notes (Signed)
CSN: [id number]     Arrival date & time 03/24/14  [id number] History   First MD Initiated Contact with Patient 03/24/14 [phone]     Chief Complaint  Patient presents with  . Anxiety   Susan GasserShirley Moll is a 80 y.o. female with a history of anxiety, depression and hyperlipidemia who presents to the emergency department complaining of worsening anxiety for the past 4 years as well as having 2 falls over the past 2 days. Patient reports feeling nervous and she states that she thinks she fell out of nervousness. Patient reports she was sitting on her bed putting on her pants when she slid off the side of the bed onto the ground. She denies any injury or loss of consciousness. She denies hitting her head. She reports taking her medicines as prescribed. The patient endorses visual hallucinations of someone walking across her eyes. She denies auditory hallucinations. Denies suicidal or homicidal ideations. The patient lives at home with her daughter and goes to senior care during the day. She reports depressed mood and anxiety. The daughter has not witnessed any falls and thinks she might be saying this for attention. The patient denies fevers, recent illness, cough, shortness of breath, chest pain, numbness, tingling, abdominal pain, vomiting, dysuria.   (Consider location/radiation/quality/duration/timing/severity/associated sxs/prior Treatment) HPI  Past Medical History  Diagnosis Date  . Anxiety   . High cholesterol   . Depression    History reviewed. No pertinent past surgical history. History reviewed. No pertinent family history. History  Substance Use Topics  . Smoking status: Former Smoker    Quit date: 03/04/1996  . Smokeless tobacco: Not on file  . Alcohol Use: No   OB History    No data available     Review of Systems  Constitutional: Negative for fever and chills.  HENT: Positive for congestion and rhinorrhea. Negative for sore throat and trouble swallowing.   Eyes: Negative for pain and  visual disturbance.  Respiratory: Negative for cough, shortness of breath and wheezing.   Cardiovascular: Negative for chest pain and palpitations.  Gastrointestinal: Negative for nausea, vomiting, abdominal pain and diarrhea.  Genitourinary: Negative for dysuria.  Musculoskeletal: Negative for neck pain.  Skin: Negative for rash.  Neurological: Negative for dizziness, syncope, light-headedness, numbness and headaches.  Psychiatric/Behavioral: Positive for hallucinations and dysphoric mood. Negative for suicidal ideas. The patient is nervous/anxious.       Allergies  Review of patient's allergies indicates no known allergies.  Home Medications   Prior to Admission medications   Medication Sig Start Date End Date Taking? Authorizing Provider  busPIRone (BUSPAR) 15 MG tablet Take 15 mg by mouth 2 (two) times daily.   Yes Historical Provider, MD  mirtazapine (REMERON) 30 MG tablet Take 1 tablet (30 mg total) by mouth at bedtime. 03/24/14   Einar GipWilliam Duncan Chae Shuster, PA-C   BP 144/61 mmHg  Pulse 79  Temp(Src) 98.6 F (37 C) (Oral)  Resp 18  Ht 5\' 6"  (1.676 m)  Wt 127 lb (57.607 kg)  BMI 20.51 kg/m2  SpO2 99% Physical Exam  Constitutional: She appears well-developed and well-nourished. No distress.  HENT:  Head: Normocephalic and atraumatic.  Mouth/Throat: Oropharynx is clear and moist. No oropharyngeal exudate.  Eyes: Conjunctivae and EOM are normal. Pupils are equal, round, and reactive to light. Right eye exhibits no discharge. Left eye exhibits no discharge.  Neck: Normal range of motion. Neck supple. No JVD present.  Cardiovascular: Normal rate, regular rhythm, normal heart sounds and intact distal pulses.  Exam reveals no gallop and no friction rub.   No murmur heard. Pulmonary/Chest: Effort normal and breath sounds normal. No respiratory distress. She has no wheezes. She has no rales.  Abdominal: Soft. Bowel sounds are normal. She exhibits no distension. There is no tenderness.   Musculoskeletal: She exhibits no edema or tenderness.  Lymphadenopathy:    She has no cervical adenopathy.  Neurological: She is alert. No cranial nerve deficit. Coordination normal.  Oriented to person and place. She is confused as to time. Finger to nose intact bilaterally although slow. Sensation intact bilateral hands. Cranial nerves intact. Patient is spontaneously moving all extremities in a coordinated fashion exhibiting good strength.   Skin: Skin is warm and dry. No rash noted. She is not diaphoretic. No erythema. No pallor.  Psychiatric: She is withdrawn. She exhibits a depressed mood. She expresses no homicidal and no suicidal ideation.  Patient appears depressed. She makes poor eye contact. She denies suicidal or homicidal ideations. Endorses anxiety and depressed mood. She endorses visual hallucinations of "someone walking across her eyes."   Nursing note and vitals reviewed.   ED Course  Procedures (including critical care time) Labs Review Labs Reviewed  COMPREHENSIVE METABOLIC PANEL - Abnormal; Notable for the following:    Albumin 3.2 (*)    GFR calc non Af Amer 48 (*)    GFR calc Af Amer 56 (*)    Anion gap 4 (*)    All other components within normal limits  URINALYSIS, ROUTINE W REFLEX MICROSCOPIC - Abnormal; Notable for the following:    Leukocytes, UA SMALL (*)    All other components within normal limits  URINE MICROSCOPIC-ADD ON - Abnormal; Notable for the following:    Squamous Epithelial / LPF FEW (*)    Bacteria, UA FEW (*)    All other components within normal limits  CBC WITH DIFFERENTIAL/PLATELET    Imaging Review Dg Chest 2 View  03/24/2014   CLINICAL DATA:  Anxiety and multiple falls.  Initial encounter.  EXAM: CHEST - 2 VIEW  COMPARISON:  None  FINDINGS: The heart size is at the upper limits of normal. Mild pulmonary hyperinflation bilaterally with evidence of probable mild underlying COPD. There is no evidence of pulmonary edema, consolidation,  pneumothorax, nodule or pleural fluid. The thoracic spine shows mild spondylosis.  IMPRESSION: Probable underlying COPD.  No active disease.   Electronically Signed   By: Irish Lack M.D.   On: 03/24/2014 11:09   Ct Head Wo Contrast  03/24/2014   CLINICAL DATA:  Fall.  Anxiety.  EXAM: CT HEAD WITHOUT CONTRAST  TECHNIQUE: Contiguous axial images were obtained from the base of the skull through the vertex without intravenous contrast.  COMPARISON:  None.  FINDINGS: Skull and Sinuses:No acute fracture after fall. There is inflammatory mucosal thickening in the paranasal sinuses, especially the anterior left ethmoid air cells.  Orbits: No acute abnormality.  Brain: No evidence of acute infarction, hemorrhage, hydrocephalus, or mass lesion/mass effect. Cerebral volume and white matter is within normal limits for age.  IMPRESSION: 1. Negative intracranial imaging. 2. Chronic left ethmoid sinusitis.   Electronically Signed   By: Marnee Spring M.D.   On: 03/24/2014 13:08     EKG Interpretation None      Filed Vitals:   03/24/14 1415 03/24/14 1430 03/24/14 1530 03/24/14 1608  BP: 143/56 147/58 146/57 144/61  Pulse: 77 85 73 79  Temp:    98.6 F (37 C)  TempSrc:    Oral  Resp: Height:      Weight:      SpO2: 99% 99% 100% 99%     MDM   Meds given in ED:  Medications - No data to display  New Prescriptions   No medications on file    Final diagnoses:  Depression  Anxiety  Falls, initial encounter   This is a 80 y.o. female with a history of anxiety, depression and hyperlipidemia who presents to the emergency department complaining of worsening anxiety for the past 4 years as well as having 2 falls over the past 2 days. These falls were unwitnessed and the patient denies any injury or loss of consciousness. Patient is afebrile and nontoxic-appearing. Patient is neurologically intact. The patient appears depressed and has poor eye contact. Patient denies suicide or homicide  ideations. Patient was evaluated by [facility] who suggest doubling her dose of Remeron and have her follow-up with [facility] as an outpatient. Her CMP and CBC are unremarkable. Her urinalysis is negative for infection. CT of her head is unremarkable. Chest x-ray shows probable underlying COPD but no active disease. Plan discussed with patient and she agrees to [facility] follow up as out patient. Discussed increasing dose and new prescription written for Remeron 30 mg daily at bedtime. I spoke with her daughter, Rinaldo Cloud, who also agreed to outpatient follow up with behavioral health and I also told her about the increase in Remeron. Strict return precautions provided. I advised the patient to follow-up with their primary care provider this week. I advised the patient to return to the emergency department with new or worsening symptoms or new concerns. The patient and her daughter verbalized understanding and agreement with plan.   This patient was discussed with and evaluated by Dr. Fayrene Fearing who agrees with assessment and plan.    Lawana Chambers, PA-C 03/24/14 1723  Rolland Porter, MD 03/31/14 [phone]

## 2014-03-24 NOTE — BH Assessment (Signed)
Tele Assessment Note   Susan Simmons is an 80 y.o. female that was seen this day via tele assessment once clinical information gathered from [facility] and [place] Danise at 1400.  Pt presents to ED after fall (pt med cleared per EDP) and reports depression and anxiety.  Pt reported she takes medications she could not recall (Remeron and Buspar) and that the medications were not helping.  Pt stated, "I need more medication for my nerves."  Pt reports she has been depressed for 4 years and that she had a lot of "stress."  Pt recently moved from [place] to live with her daughter.  Pt denies SI or hx of self-harm.  Pt denies current SI, HI.  Pt does report she sees "things go across the TV," when asked about AVH.  Pt denies SA.  Pt stated she sees a PCP for her medications and that she saw a therapist once last year, but cannot recall provider name.  Pt calm, cooperative, pleasant, with depressed mood, good eye contact, soft speech, is alert and oriented x 4, has logical/coherent thought processes.  Consulted with Constance Haw, NP at [facility] who agrees pt doesn't meet inpatient criteria at this time and that outpatient referrals are appropriate.  PA [place] consulted with EDP Fayrene Fearing, who was also in agreement with pt disposition and pt's Remeron increased by EDP James.  Sent over outpatient referrals for the pt to [facility].  Pt to be discharged and to follow up with outpatient referrals.  Axis I: 296.32 Major Depressive Disorder, Recurrent, Moderate Axis II: Deferred Axis III:  Past Medical History  Diagnosis Date  . Anxiety   . High cholesterol   . Depression    Axis IV: other psychosocial or environmental problems Axis V: 41-50 serious symptoms  Past Medical History:  Past Medical History  Diagnosis Date  . Anxiety   . High cholesterol   . Depression     History reviewed. No pertinent past surgical history.  Family History: History reviewed. No pertinent family history.  Social History:  reports that she  quit smoking about 18 years ago. She does not have any smokeless tobacco history on file. She reports that she does not drink alcohol or use illicit drugs.  Additional Social History:  Alcohol / Drug Use Pain Medications: none Prescriptions: see med list Over the Counter: see med list History of alcohol / drug use?: No history of alcohol / drug abuse Longest period of sobriety (when/how long):  (na) Negative Consequences of Use:  (na) Withdrawal Symptoms:  (na)  CIWA: CIWA-Ar BP: (!) 134/50 mmHg Pulse Rate: 72 COWS:    PATIENT STRENGTHS: (choose at least two) Ability for insight Average or above average intelligence General fund of knowledge Supportive family/friends  Allergies: No Known Allergies  Home Medications:  (Not in a hospital admission)  OB/GYN Status:  No LMP recorded. Patient is postmenopausal.  General Assessment Data Location of Assessment: [facility] ED Is this a Tele or Face-to-Face Assessment?: Tele Assessment Is this an Initial Assessment or a Re-assessment for this encounter?: Initial Assessment Living Arrangements: Children Can pt return to current living arrangement?: Yes Admission Status: Voluntary Is patient capable of signing voluntary admission?: Yes Transfer from: Acute Hospital Referral Source: Self/Family/Friend     [facility] Crisis Care Plan Living Arrangements: Children Name of Psychiatrist: none Name of Therapist: none  Education Status Is patient currently in school?: No  Risk to self with the past 6 months Suicidal Ideation: No Suicidal Intent: No Is patient at  risk for suicide?: No Suicidal Plan?: No Access to Means: No What has been your use of drugs/alcohol within the last 12 months?: na - pt denies Previous Attempts/Gestures: No How many times?: 0 Other Self Harm Risks: na - pt denies Triggers for Past Attempts: None known Intentional Self Injurious Behavior: None Family Suicide History: No Recent stressful life event(s): Other  (Comment) (depression, recent fall) Persecutory voices/beliefs?: No Depression: Yes Depression Symptoms: Despondent, Tearfulness, Isolating, Loss of interest in usual pleasures, Feeling worthless/self pity Substance abuse history and/or treatment for substance abuse?: No Suicide prevention information given to non-admitted patients: Not applicable  Risk to Others within the past 6 months Homicidal Ideation: No Thoughts of Harm to Others: No Current Homicidal Intent: No Current Homicidal Plan: No Access to Homicidal Means: No Identified Victim: na - pt denies History of harm to others?: No Assessment of Violence: None Noted Violent Behavior Description: na - pt calm, cooperative Does patient have access to weapons?: No Criminal Charges Pending?: No Does patient have a court date: No  Psychosis Hallucinations: Visual (reports occ seeing things "go over the TV") Delusions: None noted  Mental Status Report Appear/Hygiene: Unremarkable, In hospital gown Eye Contact: Good Motor Activity: Freedom of movement, Unremarkable Speech: Logical/coherent, Loud Level of Consciousness: Quiet/awake Mood: Depressed Affect: Appropriate to circumstance Anxiety Level: Moderate Thought Processes: Coherent, Relevant Judgement: Impaired Orientation: Person, Place, Time, Situation Obsessive Compulsive Thoughts/Behaviors: None  Cognitive Functioning Concentration: Normal Memory: Remote Intact, Recent Impaired IQ: Average Insight: Fair Impulse Control: Fair Appetite: Fair Weight Loss: 15 Weight Gain: 0 Sleep: No Change Total Hours of Sleep: 8 Vegetative Symptoms: None  ADLScreening [facility]) Patient's cognitive ability adequate to safely complete daily activities?: Yes Patient able to express need for assistance with ADLs?: Yes Independently performs ADLs?: Yes (appropriate for developmental age)  Prior Inpatient Therapy Prior Inpatient Therapy: No Prior Therapy Dates:  na Prior Therapy Facilty/Provider(s): na Reason for Treatment: na  Prior Outpatient Therapy Prior Outpatient Therapy: Yes Prior Therapy Dates: 2015 Prior Therapy Facilty/Provider(s[place] Reason for Treatment: med mgnt  ADL Screening (condition at time of admission) Patient's cognitive ability adequate to safely complete daily activities?: Yes Is the patient deaf or have difficulty hearing?: No Does the patient have difficulty seeing, even when wearing glasses/contacts?: No Does the patient have difficulty concentrating, remembering, or making decisions?: No Patient able to express need for assistance with ADLs?: Yes Does the patient have difficulty dressing or bathing?: No Independently performs ADLs?: Yes (appropriate for developmental age) Does the patient have difficulty walking or climbing stairs?: Yes  Home Assistive Devices/Equipment Home Assistive Devices/Equipment: Cane (specify quad or straight)    Abuse/Neglect Assessment (Assessment to be complete while patient is alone) Physical Abuse: Denies Verbal Abuse: Denies Sexual Abuse: Denies Exploitation of patient/patient's resources: Denies Self-Neglect: Denies Values / Beliefs Cultural Requests During Hospitalization: None Spiritual Requests During Hospitalization: None Consults Spiritual Care Consult Needed: No Social Work Consult Needed: No Merchant navy officerAdvance Directives (For Healthcare) Does patient have an advance directive?: Yes Would patient like information on creating an advanced directive?: No - patient declined information Type of Advance Directive: Living will Does patient want to make changes to advanced directive?: No - Patient declined    Additional Information 1:1 In Past 12 Months?: No CIRT Risk: No Elopement Risk: No Does patient have medical clearance?: Yes     Disposition:  Disposition Initial Assessment Completed for this Encounter: Yes Disposition of Patient: Referred  to, Outpatient treatment Type of outpatient  treatment: Adult Patient referred to: Outpatient clinic referral  Casimer Lanius, MS, [facility] Licensed Professional Counselor Therapeutic Triage Specialist [facility] Phone: [phone] Fax: [phone]  03/24/2014 2:54 PM

## 2014-03-24 NOTE — ED Notes (Signed)
TSS in process.  

## 2014-04-26 ENCOUNTER — Ambulatory Visit: Payer: Self-pay | Admitting: Neurology

## 2014-04-26 ENCOUNTER — Telehealth: Payer: Self-pay | Admitting: *Deleted

## 2014-04-26 NOTE — Telephone Encounter (Signed)
Talked with patient and rescheduled for 3/25 at 830am. Told them to be here about 15 min before. Verbalized understanding.

## 2014-04-30 ENCOUNTER — Encounter: Payer: Self-pay | Admitting: Neurology

## 2014-04-30 ENCOUNTER — Ambulatory Visit (INDEPENDENT_AMBULATORY_CARE_PROVIDER_SITE_OTHER): Payer: Medicare Other | Admitting: Neurology

## 2014-04-30 VITALS — BP 117/60 | HR 82 | Temp 97.6°F | Ht 66.0 in | Wt 124.0 lb

## 2014-04-30 DIAGNOSIS — F329 Major depressive disorder, single episode, unspecified: Secondary | ICD-10-CM

## 2014-04-30 DIAGNOSIS — G20 Parkinson's disease: Secondary | ICD-10-CM | POA: Diagnosis not present

## 2014-04-30 DIAGNOSIS — F411 Generalized anxiety disorder: Secondary | ICD-10-CM | POA: Diagnosis not present

## 2014-04-30 DIAGNOSIS — F32A Depression, unspecified: Secondary | ICD-10-CM

## 2014-04-30 NOTE — Progress Notes (Signed)
[facility]    Provider:  Dr Lucia GaskinsAhern Referring Provider: Barbaraann Barthelankins, Fanny DanceVictoria R, MD Primary Care Physician:  Beverley FiedlerANKINS,VICTORIA, MD  CC:  Shuffling gait  HPI:  Susan GasserShirley Simmons is a 80 y.o. female here as a referral from Dr. Barbaraann Barthelankins for   For 5 years, she has had bad depression since her husband passed. She laid in bed for 5 years, stopped doing everything, then had muscle disuse. Things are better now. She has moved in with her daughter. Daughter provides all information. She has been to physical therapy, been to inpatient PT, daughter works every day to keep up her strength, she goes to adult daycare. Patient still has decreased interest, decreased social interest. She can walk normal with encouragement. No falls, instability, Mother says she has tremors, "like I am scared". Daughter says she she has anxiety. Patient says she notice it when using the hand, like when dressing. Decreased concentration.She heasitates, says she is "scared to get up". Symptoms have improved since she first moved in with daughter, and some days she can even "walk normally". She has been on 14 different medications. Unclear use of dopamine blocking agents. Severe worsening depression, refractory.  Review of Systems: Patient complains of symptoms per HPI as well as the following symptoms: fatigue, constipation, nausea, back pain, aching muscles, weakness, memory loss. Pertinent negatives per HPI. All others negative.   History   Social History  . Marital Status: Divorced    Spouse Name: N/A  . Number of Children: 4  . Years of Education: 11   Occupational History  . Not on file.   Social History Main Topics  . Smoking status: Former Smoker    Quit date: 03/04/1996  . Smokeless tobacco: Not on file  . Alcohol Use: No  . Drug Use: No  . Sexual Activity: Not on file   Other Topics Concern  . Not on file   Social History Narrative   Lives at home with daughter   Caffeine use: none     Family History  Problem Relation Age of Onset  . Dementia Neg Hx   . Parkinsonism Neg Hx     Past Medical History  Diagnosis Date  . Anxiety   . High cholesterol   . Depression     Past Surgical History  Procedure Laterality Date  . Denies surgical history      Current Outpatient Prescriptions  Medication Sig Dispense Refill  . busPIRone (BUSPAR) 15 MG tablet Take 15 mg by mouth 2 (two) times daily.    . mirtazapine (REMERON) 30 MG tablet Take 1 tablet (30 mg total) by mouth at bedtime. (Patient taking differently: Take 15 mg by mouth at bedtime. ) 30 tablet 0   No current facility-administered medications for this visit.    Allergies as of 04/30/2014  . (No Known Allergies)    Vitals: BP 117/60 mmHg  Pulse 82  Temp(Src) 97.6 F (36.4 C)  Ht 5\' 6"  (1.676 m)  Wt 124 lb (56.246 kg)  BMI 20.02 kg/m2 Last Weight:  Wt Readings from Last 1 Encounters:  04/30/14 124 lb (56.246 kg)   Last Height:   Ht Readings from Last 1 Encounters:  04/30/14 5\' 6"  (1.676 m)   Physical exam: Exam: Gen: NAD, not conversant, well nourised, well groomed                     CV: RRR, +SEM. No Carotid Bruits. No peripheral edema, warm, nontender Eyes: Conjunctivae clear without exudates  or hemorrhage  Neuro: Detailed Neurologic Exam  Generalized psychomotor slowing  Speech:   Not aphasic, not dysarthric Cognition:    The patient is oriented to person, can't tell me the month or year.    recent and remote memory impaired;     language fluent;     Impaired attention, concentration,     fund of knowledge Cranial Nerves:Decreased blink reflex    The pupils are equal, round, and reactive to light. The fundi are flat. Visual fields are full to finger confrontation. Extraocular movements are intact. Trigeminal sensation is intact and the muscles of mastication are normal. The face is symmetric. The palate elevates in the midline. Hearing intact. Voice is normal. Shoulder shrug is  normal. The tongue has normal motion without fasciculations.   Coordination:    Intact finger tapping  Gait:    Shuffling, decreased arm swing, improved with encouragement  Motor Observation:    No tremor Tone:    Not increased  Posture:    Posture is mildly stooped    Strength:    Poor effort, anti-gravity, symmetric     Sensation: intact to LT     Reflex Exam:  DTR's:    Brisk patellar and biceps DTRs Toes:    The toes are downgoing bilaterally.   Clonus:    Clonus is absent.         Assessment/Plan:  80 year old female with severe depression here for evaluation of Parkinson's Disease. She has  shuffling gait, decreased arm swing, decreased blink reflex. However things that are not consistent with Parkinson's Disease are reports that gait improved to "normal" with encouragement, no tremor or cogwheeling,  improving symptoms with improved mood, symptoms not progressive. Feel symptoms are more related to severe worsening depression with psychomotor slowing. Don't have a good history of dopamine-blocking drugs she has used in the past. Patient needs follow up with psychiatry, recommended Dr Archer Asa who is a geriatric psychiatrist. Can follow symptoms clinically for progression to parkinson's.disease, f/u with me in 3-4 months.  Naomie Dean, MD  [facility] [address] [place], [place] [address]  Phone [phone] Fax [phone]

## 2014-04-30 NOTE — Patient Instructions (Signed)
Overall you are doing fairly well but I do want to suggest a few things today:   Remember to drink plenty of fluid, eat healthy meals and do not skip any meals. Try to eat protein with a every meal and eat a healthy snack such as fruit or nuts in between meals. Try to keep a regular sleep-wake schedule and try to exercise daily, particularly in the form of walking, 20-30 minutes a day, if you can.   As far as your medications are concerned, I would like to suggest: f/u with Dr. Archer AsaGerald Plovsky, MD   As far as diagnostic testing: Suggest Geriatric Psychiatrist such as Dr. Ignacia BayleyPlowsky  I would like to see you back in 4 months, sooner if we need to. Please call us with any interim questions, concerns, problems, updates or refill requests.   Please also call us for any test results so we can go over those with you on the phone.  My clinical assistant and will answer any of your questions and relay your messages to me and also relay most of my messages to you.   Our phone number is [phone]. We also have an after hours call service for urgent matters and there is a physician on-call for urgent questions. For any emergencies you know to call 911 or go to the nearest emergency room

## 2014-05-02 ENCOUNTER — Encounter: Payer: Self-pay | Admitting: Neurology

## 2014-05-02 DIAGNOSIS — F32A Depression, unspecified: Secondary | ICD-10-CM | POA: Insufficient documentation

## 2014-05-02 DIAGNOSIS — F329 Major depressive disorder, single episode, unspecified: Secondary | ICD-10-CM | POA: Insufficient documentation

## 2014-05-15 ENCOUNTER — Emergency Department (HOSPITAL_COMMUNITY): Payer: Medicare Other

## 2014-05-15 ENCOUNTER — Emergency Department (HOSPITAL_COMMUNITY)
Admission: EM | Admit: 2014-05-15 | Discharge: 2014-05-15 | Disposition: A | Discharge status: Home / Self Care | Payer: Medicare Other | Attending: Emergency Medicine | Admitting: Emergency Medicine

## 2014-05-15 ENCOUNTER — Encounter (HOSPITAL_COMMUNITY): Payer: Self-pay | Admitting: Emergency Medicine

## 2014-05-15 DIAGNOSIS — K5900 Constipation, unspecified: Secondary | ICD-10-CM | POA: Insufficient documentation

## 2014-05-15 DIAGNOSIS — N390 Urinary tract infection, site not specified: Secondary | ICD-10-CM

## 2014-05-15 DIAGNOSIS — Z79899 Other long term (current) drug therapy: Secondary | ICD-10-CM | POA: Diagnosis not present

## 2014-05-15 DIAGNOSIS — R400 Somnolence: Secondary | ICD-10-CM | POA: Diagnosis not present

## 2014-05-15 DIAGNOSIS — Z8639 Personal history of other endocrine, nutritional and metabolic disease: Secondary | ICD-10-CM | POA: Diagnosis not present

## 2014-05-15 DIAGNOSIS — Z87891 Personal history of nicotine dependence: Secondary | ICD-10-CM | POA: Diagnosis not present

## 2014-05-15 DIAGNOSIS — Z8659 Personal history of other mental and behavioral disorders: Secondary | ICD-10-CM | POA: Diagnosis not present

## 2014-05-15 LAB — CBC WITH DIFFERENTIAL/PLATELET
Basophils Absolute: 0 10*3/uL (ref 0.0–0.1)
Basophils Relative: 1 % (ref 0–1)
EOS PCT: 3 % (ref 0–5)
Eosinophils Absolute: 0.1 10*3/uL (ref 0.0–0.7)
HCT: 37.9 % (ref 36.0–46.0)
Hemoglobin: 12.1 g/dL (ref 12.0–15.0)
LYMPHS ABS: 1.4 10*3/uL (ref 0.7–4.0)
Lymphocytes Relative: 43 % (ref 12–46)
MCH: 29.2 pg (ref 26.0–34.0)
MCHC: 31.9 g/dL (ref 30.0–36.0)
MCV: 91.3 fL (ref 78.0–100.0)
MONO ABS: 0.4 10*3/uL (ref 0.1–1.0)
MONOS PCT: 13 % — AB (ref 3–12)
NEUTROS PCT: 40 % — AB (ref 43–77)
Neutro Abs: 1.2 10*3/uL — ABNORMAL LOW (ref 1.7–7.7)
PLATELETS: 218 10*3/uL (ref 150–400)
RBC: 4.15 MIL/uL (ref 3.87–5.11)
RDW: 12.5 % (ref 11.5–15.5)
WBC: 3.1 10*3/uL — ABNORMAL LOW (ref 4.0–10.5)

## 2014-05-15 LAB — URINALYSIS, ROUTINE W REFLEX MICROSCOPIC
Bilirubin Urine: NEGATIVE
Glucose, UA: NEGATIVE mg/dL
KETONES UR: NEGATIVE mg/dL
Nitrite: NEGATIVE
Protein, ur: NEGATIVE mg/dL
Specific Gravity, Urine: 1.028 (ref 1.005–1.030)
UROBILINOGEN UA: 1 mg/dL (ref 0.0–1.0)
pH: 6 (ref 5.0–8.0)

## 2014-05-15 LAB — COMPREHENSIVE METABOLIC PANEL
ALK PHOS: 49 U/L (ref 39–117)
ALT: 17 U/L (ref 0–35)
AST: 29 U/L (ref 0–37)
Albumin: 2.9 g/dL — ABNORMAL LOW (ref 3.5–5.2)
Anion gap: 9 (ref 5–15)
BUN: 20 mg/dL (ref 6–23)
CO2: 27 mmol/L (ref 19–32)
CREATININE: 1 mg/dL (ref 0.50–1.10)
Calcium: 8.6 mg/dL (ref 8.4–10.5)
Chloride: 104 mmol/L (ref 96–112)
GFR calc Af Amer: 61 mL/min — ABNORMAL LOW (ref >90)
GFR calc non Af Amer: 53 mL/min — ABNORMAL LOW (ref >90)
GLUCOSE: 164 mg/dL — AB (ref 70–99)
Potassium: 3.8 mmol/L (ref 3.5–5.1)
Sodium: 140 mmol/L (ref 135–145)
Total Bilirubin: 0.6 mg/dL (ref 0.3–1.2)
Total Protein: 5.6 g/dL — ABNORMAL LOW (ref 6.0–8.3)

## 2014-05-15 LAB — TROPONIN I: Troponin I: 0.03 ng/mL (ref <0.031)

## 2014-05-15 LAB — URINE MICROSCOPIC-ADD ON

## 2014-05-15 LAB — I-STAT TROPONIN, ED: Troponin i, poc: 0.02 ng/mL (ref 0.00–0.08)

## 2014-05-15 LAB — POC OCCULT BLOOD, ED: Fecal Occult Bld: POSITIVE — AB

## 2014-05-15 MED ORDER — CEPHALEXIN 500 MG PO CAPS: 500.0000 mg | ORAL_CAPSULE | Freq: Four times a day (QID) | ORAL | Status: DC

## 2014-05-15 NOTE — ED Provider Notes (Signed)
CSN: [id number]     Arrival date & time 05/15/14  1241 History   First MD Initiated Contact with Patient 05/15/14 1254     Chief Complaint  Patient presents with  . Constipation     (Consider location/radiation/quality/duration/timing/severity/associated sxs/prior Treatment) HPI Comments: Patient is a 80 year old female with history of hypercholesterolemia, anxiety, depression. She presents for evaluation of constipation. She states that she has not had a bowel movement in several weeks. She denies any vomiting. She denies any abdominal pain. She denies any fevers or chills. She denies any urinary complaints.  Patient is a 80 y.o. female presenting with constipation. The history is provided by the patient.  Constipation Severity:  Moderate Time since last bowel movement:  2 weeks Timing:  Constant Progression:  Worsening Chronicity:  New Context: not dehydration   Stool description:  None produced Relieved by:  Nothing Worsened by:  Nothing tried Ineffective treatments:  None tried   Past Medical History  Diagnosis Date  . Anxiety   . High cholesterol   . Depression    Past Surgical History  Procedure Laterality Date  . Denies surgical history     Family History  Problem Relation Age of Onset  . Dementia Neg Hx   . Parkinsonism Neg Hx    History  Substance Use Topics  . Smoking status: Former Smoker    Quit date: 03/04/1996  . Smokeless tobacco: Not on file  . Alcohol Use: No   OB History    No data available     Review of Systems  Gastrointestinal: Positive for constipation.  All other systems reviewed and are negative.     Allergies  Review of patient's allergies indicates no known allergies.  Home Medications   Prior to Admission medications   Medication Sig Start Date End Date Taking? Authorizing Provider  busPIRone (BUSPAR) 15 MG tablet Take 15 mg by mouth 2 (two) times daily.   Yes Historical Provider, MD  cholecalciferol (VITAMIN D) 1000 UNITS  tablet Take 1,000 Units by mouth daily.   Yes Historical Provider, MD  mirtazapine (REMERON) 30 MG tablet Take 1 tablet (30 mg total) by mouth at bedtime. Patient taking differently: Take 15 mg by mouth at bedtime.  03/24/14  Yes Everlene Farrier, PA-C  Multiple Vitamin (MULTIVITAMIN WITH MINERALS) TABS tablet Take 1 tablet by mouth daily.   Yes Historical Provider, MD   BP 128/48 mmHg  Pulse 83  Temp(Src) 98.4 F (36.9 C) (Oral)  Resp 18  SpO2 99% Physical Exam  Constitutional: She is oriented to person, place, and time. She appears well-developed and well-nourished. No distress.  Patient appears to be resting comfortably. She is somnolent, but will open eyes and answer questions. She then seems to drift back off to sleep.  HENT:  Head: Normocephalic and atraumatic.  Neck: Normal range of motion. Neck supple.  Cardiovascular: Normal rate and regular rhythm.  Exam reveals no gallop and no friction rub.   No murmur heard. Pulmonary/Chest: Effort normal and breath sounds normal. No respiratory distress. She has no wheezes.  Abdominal: Soft. Bowel sounds are normal. She exhibits no distension. There is no tenderness.  Musculoskeletal: Normal range of motion.  Neurological: She is alert and oriented to person, place, and time.  Skin: Skin is warm and dry. She is not diaphoretic.  Nursing note and vitals reviewed.   ED Course  Procedures (including critical care time) Labs Review Labs Reviewed  COMPREHENSIVE METABOLIC PANEL  CBC WITH DIFFERENTIAL/PLATELET  TROPONIN I  URINALYSIS, ROUTINE W REFLEX MICROSCOPIC  POC OCCULT BLOOD, ED    Imaging Review No results found.   EKG Interpretation   Date/Time:  Saturday May 15 2014 13:35:20 EDT Ventricular Rate:  82 PR Interval:  178 QRS Duration: 100 QT Interval:  369 QTC Calculation: 431 R Axis:   -27 Text Interpretation:  Sinus rhythm Borderline left axis deviation Abnormal  T, consider ischemia, lateral leads Confirmed by DELOS   MD, Jerricka Carvey  ([id number]) on 05/15/2014 3:30:57 PM      MDM   Final diagnoses:  None    Patient presents with complaints of constipation for several weeks. Rectal exam reveals no evidence for fecal impaction, however abdominal x-rays do reveal moderate colonic stool burden but no obstruction. She will be treated with magnesium citrate and when necessary return.  While here in the emergency department patient was very somnolent and according to the daughter was not quite herself. For this reason, a workup into a mental status change was initiated, however was unremarkable as well. Her laboratory studies, EKG, and head CT are all unremarkable. She is now feeling better and is more alert and awake and I believe appropriate for discharge.    Geoffery Lyonsouglas Cheyenne Schumm, MD 05/15/14 [phone]

## 2014-05-15 NOTE — Discharge Instructions (Signed)
Magnesium citrate: Drink 2/3 of a 10 ounce bottle mixed with equal parts Sprite or Gatorade for relief of constipation.  Return to the emergency department for increased pain, bloody stool, high fever, or other new and concerning symptoms.   Constipation Constipation is when a person has fewer than three bowel movements a week, has difficulty having a bowel movement, or has stools that are dry, hard, or larger than normal. As people grow older, constipation is more common. If you try to fix constipation with medicines that make you have a bowel movement (laxatives), the problem may get worse. Long-term laxative use may cause the muscles of the colon to become weak. A low-fiber diet, not taking in enough fluids, and taking certain medicines may make constipation worse.  CAUSES   Certain medicines, such as antidepressants, pain medicine, iron supplements, antacids, and water pills.   Certain diseases, such as diabetes, irritable bowel syndrome (IBS), thyroid disease, or depression.   Not drinking enough water.   Not eating enough fiber-rich foods.   Stress or travel.   Lack of physical activity or exercise.   Ignoring the urge to have a bowel movement.   Using laxatives too much.  SIGNS AND SYMPTOMS   Having fewer than three bowel movements a week.   Straining to have a bowel movement.   Having stools that are hard, dry, or larger than normal.   Feeling full or bloated.   Pain in the lower abdomen.   Not feeling relief after having a bowel movement.  DIAGNOSIS  Your health care provider will take a medical history and perform a physical exam. Further testing may be done for severe constipation. Some tests may include:  A barium enema X-ray to examine your rectum, colon, and, sometimes, your small intestine.   A sigmoidoscopy to examine your lower colon.   A colonoscopy to examine your entire colon. TREATMENT  Treatment will depend on the severity of your  constipation and what is causing it. Some dietary treatments include drinking more fluids and eating more fiber-rich foods. Lifestyle treatments may include regular exercise. If these diet and lifestyle recommendations do not help, your health care provider may recommend taking over-the-counter laxative medicines to help you have bowel movements. Prescription medicines may be prescribed if over-the-counter medicines do not work.  HOME CARE INSTRUCTIONS   Eat foods that have a lot of fiber, such as fruits, vegetables, whole grains, and beans.  Limit foods high in fat and processed sugars, such as french fries, hamburgers, cookies, candies, and soda.   A fiber supplement may be added to your diet if you cannot get enough fiber from foods.   Drink enough fluids to keep your urine clear or pale yellow.   Exercise regularly or as directed by your health care provider.   Go to the restroom when you have the urge to go. Do not hold it.   Only take over-the-counter or prescription medicines as directed by your health care provider. Do not take other medicines for constipation without talking to your health care provider first.  SEEK IMMEDIATE MEDICAL CARE IF:   You have bright red blood in your stool.   Your constipation lasts for more than 4 days or gets worse.   You have abdominal or rectal pain.   You have thin, pencil-like stools.   You have unexplained weight loss. MAKE SURE YOU:   Understand these instructions.  Will watch your condition.  Will get help right away if you are not doing  well or get worse. Document Released: 10/21/2003 Document Revised: 01/27/2013 Document Reviewed: 11/03/2012 [facility] Patient Information 2015 [place], [place]. This information is not intended to replace advice given to you by your health care provider. Make sure you discuss any questions you have with your health care provider. Urinary Tract Infection Urinary tract infections (UTIs) can  develop anywhere along your urinary tract. Your urinary tract is your body's drainage system for removing wastes and extra water. Your urinary tract includes two kidneys, two ureters, a bladder, and a urethra. Your kidneys are a pair of bean-shaped organs. Each kidney is about the size of your fist. They are located below your ribs, one on each side of your spine. CAUSES Infections are caused by microbes, which are microscopic organisms, including fungi, viruses, and bacteria. These organisms are so small that they can only be seen through a microscope. Bacteria are the microbes that most commonly cause UTIs. SYMPTOMS  Symptoms of UTIs may vary by age and gender of the patient and by the location of the infection. Symptoms in young women typically include a frequent and intense urge to urinate and a painful, burning feeling in the bladder or urethra during urination. Older women and men are more likely to be tired, shaky, and weak and have muscle aches and abdominal pain. A fever may mean the infection is in your kidneys. Other symptoms of a kidney infection include pain in your back or sides below the ribs, nausea, and vomiting. DIAGNOSIS To diagnose a UTI, your caregiver will ask you about your symptoms. Your caregiver also will ask to provide a urine sample. The urine sample will be tested for bacteria and white blood cells. White blood cells are made by your body to help fight infection. TREATMENT  Typically, UTIs can be treated with medication. Because most UTIs are caused by a bacterial infection, they usually can be treated with the use of antibiotics. The choice of antibiotic and length of treatment depend on your symptoms and the type of bacteria causing your infection. HOME CARE INSTRUCTIONS  If you were prescribed antibiotics, take them exactly as your caregiver instructs you. Finish the medication even if you feel better after you have only taken some of the medication.  Drink enough water  and fluids to keep your urine clear or pale yellow.  Avoid caffeine, tea, and carbonated beverages. They tend to irritate your bladder.  Empty your bladder often. Avoid holding urine for long periods of time.  Empty your bladder before and after sexual intercourse.  After a bowel movement, women should cleanse from front to back. Use each tissue only once. SEEK MEDICAL CARE IF:   You have back pain.  You develop a fever.  Your symptoms do not begin to resolve within 3 days. SEEK IMMEDIATE MEDICAL CARE IF:   You have severe back pain or lower abdominal pain.  You develop chills.  You have nausea or vomiting.  You have continued burning or discomfort with urination. MAKE SURE YOU:   Understand these instructions.  Will watch your condition.  Will get help right away if you are not doing well or get worse. Document Released: 11/01/2004 Document Revised: 07/24/2011 Document Reviewed: 03/02/2011 [facility] Patient Information 2015 [place], [place]. This information is not intended to replace advice given to you by your health care provider. Make sure you discuss any questions you have with your health care provider.

## 2014-05-15 NOTE — ED Notes (Signed)
MD Delo at bedside. 

## 2014-05-15 NOTE — ED Notes (Signed)
Pt alert and oriented upon DC. She was advised to follow up with PCP. She was DC with daughter.

## 2014-05-15 NOTE — ED Notes (Signed)
Nurse starting IV 

## 2014-05-15 NOTE — ED Notes (Signed)
Pt c/o constipation x several weeks, states she was able to produce a small amount of stool today after significant effort and pushing. Pt c/o 5/10 abdominal pain currently, states she is unsure of pain onset.

## 2014-06-02 ENCOUNTER — Telehealth: Payer: Self-pay

## 2014-06-02 NOTE — Telephone Encounter (Signed)
Called and spoke with Elita QuickPam and gave her [place] [facility] phone number: [phone]. [name] verbalized understanding.

## 2014-06-02 NOTE — Telephone Encounter (Signed)
Please ask her to [facility] with  [facility]  [address]  [place], [place] [address]  Main: [phone]

## 2014-06-02 NOTE — Telephone Encounter (Signed)
Tried calling patient's daughter [name]([name]) back. Went straight to voicemail but inbox was full and unable to leave message. Will try again later.

## 2014-06-02 NOTE — Telephone Encounter (Signed)
Called and spoke to patient's daughter and she stated she had not received a call Dr. Rosey Bathgearld Plovsky office . Patient has to follow up with Dr. Lucia GaskinsAhern in September she is on Her Friday List. I didn't see any order's please advise me on this. I will give daughter a call back.

## 2014-06-04 NOTE — Telephone Encounter (Signed)
Patient's daughter will call back to schedule her apt. With Dr. Lucia GaskinsAhern in September. Patient needs 30 minute visit.

## 2014-07-14 ENCOUNTER — Emergency Department (HOSPITAL_COMMUNITY)
Admission: EM | Admit: 2014-07-14 | Discharge: 2014-07-14 | Disposition: A | Discharge status: Home / Self Care | Payer: Medicare Other | Attending: Emergency Medicine | Admitting: Emergency Medicine

## 2014-07-14 ENCOUNTER — Emergency Department (HOSPITAL_COMMUNITY): Payer: Medicare Other

## 2014-07-14 ENCOUNTER — Encounter (HOSPITAL_COMMUNITY): Payer: Self-pay | Admitting: Emergency Medicine

## 2014-07-14 ENCOUNTER — Telehealth: Payer: Self-pay | Admitting: *Deleted

## 2014-07-14 DIAGNOSIS — Z87891 Personal history of nicotine dependence: Secondary | ICD-10-CM | POA: Insufficient documentation

## 2014-07-14 DIAGNOSIS — R410 Disorientation, unspecified: Secondary | ICD-10-CM | POA: Insufficient documentation

## 2014-07-14 DIAGNOSIS — Z79899 Other long term (current) drug therapy: Secondary | ICD-10-CM | POA: Insufficient documentation

## 2014-07-14 DIAGNOSIS — Z8639 Personal history of other endocrine, nutritional and metabolic disease: Secondary | ICD-10-CM | POA: Insufficient documentation

## 2014-07-14 DIAGNOSIS — M25551 Pain in right hip: Secondary | ICD-10-CM | POA: Diagnosis not present

## 2014-07-14 DIAGNOSIS — G20 Parkinson's disease: Secondary | ICD-10-CM | POA: Diagnosis not present

## 2014-07-14 DIAGNOSIS — M549 Dorsalgia, unspecified: Secondary | ICD-10-CM | POA: Diagnosis present

## 2014-07-14 DIAGNOSIS — M25559 Pain in unspecified hip: Secondary | ICD-10-CM

## 2014-07-14 LAB — CBC WITH DIFFERENTIAL/PLATELET
Basophils Absolute: 0 10*3/uL (ref 0.0–0.1)
Basophils Relative: 0 % (ref 0–1)
Eosinophils Absolute: 0 10*3/uL (ref 0.0–0.7)
Eosinophils Relative: 0 % (ref 0–5)
HEMATOCRIT: 41.8 % (ref 36.0–46.0)
HEMOGLOBIN: 13.5 g/dL (ref 12.0–15.0)
Lymphocytes Relative: 29 % (ref 12–46)
Lymphs Abs: 1.3 10*3/uL (ref 0.7–4.0)
MCH: 28.6 pg (ref 26.0–34.0)
MCHC: 32.3 g/dL (ref 30.0–36.0)
MCV: 88.6 fL (ref 78.0–100.0)
MONO ABS: 0.6 10*3/uL (ref 0.1–1.0)
Monocytes Relative: 12 % (ref 3–12)
Neutro Abs: 2.6 10*3/uL (ref 1.7–7.7)
Neutrophils Relative %: 59 % (ref 43–77)
PLATELETS: 230 10*3/uL (ref 150–400)
RBC: 4.72 MIL/uL (ref 3.87–5.11)
RDW: 12.2 % (ref 11.5–15.5)
WBC: 4.6 10*3/uL (ref 4.0–10.5)

## 2014-07-14 LAB — URINALYSIS, ROUTINE W REFLEX MICROSCOPIC
Bilirubin Urine: NEGATIVE
GLUCOSE, UA: NEGATIVE mg/dL
KETONES UR: NEGATIVE mg/dL
Nitrite: NEGATIVE
PROTEIN: NEGATIVE mg/dL
SPECIFIC GRAVITY, URINE: 1.027 (ref 1.005–1.030)
Urobilinogen, UA: 1 mg/dL (ref 0.0–1.0)
pH: 5.5 (ref 5.0–8.0)

## 2014-07-14 LAB — LIPASE, BLOOD: Lipase: 18 U/L — ABNORMAL LOW (ref 22–51)

## 2014-07-14 LAB — COMPREHENSIVE METABOLIC PANEL
ALK PHOS: 59 U/L (ref 38–126)
ALT: 19 U/L (ref 14–54)
ANION GAP: 9 (ref 5–15)
AST: 46 U/L — AB (ref 15–41)
Albumin: 3.5 g/dL (ref 3.5–5.0)
BILIRUBIN TOTAL: 0.6 mg/dL (ref 0.3–1.2)
BUN: 22 mg/dL — ABNORMAL HIGH (ref 6–20)
CALCIUM: 9 mg/dL (ref 8.9–10.3)
CO2: 25 mmol/L (ref 22–32)
Chloride: 105 mmol/L (ref 101–111)
Creatinine, Ser: 1.1 mg/dL — ABNORMAL HIGH (ref 0.44–1.00)
GFR calc Af Amer: 55 mL/min — ABNORMAL LOW (ref >60)
GFR calc non Af Amer: 47 mL/min — ABNORMAL LOW (ref >60)
Glucose, Bld: 126 mg/dL — ABNORMAL HIGH (ref 65–99)
POTASSIUM: 4 mmol/L (ref 3.5–5.1)
Sodium: 139 mmol/L (ref 135–145)
Total Protein: 6.8 g/dL (ref 6.5–8.1)

## 2014-07-14 LAB — CBG MONITORING, ED: GLUCOSE-CAPILLARY: 78 mg/dL (ref 65–99)

## 2014-07-14 LAB — URINE MICROSCOPIC-ADD ON

## 2014-07-14 LAB — I-STAT CG4 LACTIC ACID, ED: LACTIC ACID, VENOUS: 1.35 mmol/L (ref 0.5–2.0)

## 2014-07-14 NOTE — Discharge Instructions (Signed)
Hip Pain Your hip is the joint between your upper legs and your lower pelvis. The bones, cartilage, tendons, and muscles of your hip joint perform a lot of work each day supporting your body weight and allowing you to move around. Hip pain can range from a minor ache to severe pain in one or both of your hips. Pain may be felt on the inside of the hip joint near the groin, or the outside near the buttocks and upper thigh. You may have swelling or stiffness as well.  HOME CARE INSTRUCTIONS   Take medicines only as directed by your health care provider.  Apply ice to the injured area:  Put ice in a plastic bag.  Place a towel between your skin and the bag.  Leave the ice on for 15-20 minutes at a time, 3-4 times a day.  Keep your leg raised (elevated) when possible to lessen swelling.  Avoid activities that cause pain.  Follow specific exercises as directed by your health care provider.  Sleep with a pillow between your legs on your most comfortable side.  Record how often you have hip pain, the location of the pain, and what it feels like. SEEK MEDICAL CARE IF:   You are unable to put weight on your leg.  Your hip is red or swollen or very tender to touch.  Your pain or swelling continues or worsens after 1 week.  You have increasing difficulty walking.  You have a fever. SEEK IMMEDIATE MEDICAL CARE IF:   You have fallen.  You have a sudden increase in pain and swelling in your hip. MAKE SURE YOU:   Understand these instructions.  Will watch your condition.  Will get help right away if you are not doing well or get worse. Document Released: 07/12/2009 Document Revised: 06/08/2013 Document Reviewed: 09/18/2012 [facility] Patient Information 2015 [place], [place]. This information is not intended to replace advice given to you by your health care provider. Make sure you discuss any questions you have with your health care provider. Confusion Confusion is the inability to  think with your usual speed or clarity. Confusion may come on quickly or slowly over time. How quickly the confusion comes on depends on the cause. Confusion can be due to any number of causes. CAUSES   Concussion, head injury, or head trauma.  Seizures.  Stroke.  Fever.  Brain tumor.  Age related decreased brain function (dementia).  Heightened emotional states like rage or terror.  Mental illness in which the person loses the ability to determine what is real and what is not (hallucinations).  Infections such as a urinary tract infection (UTI).  Toxic effects from alcohol, drugs, or prescription medicines.  Dehydration and an imbalance of salts in the body (electrolytes).  Lack of sleep.  Low blood sugar (diabetes).  Low levels of oxygen from conditions such as chronic lung disorders.  Drug interactions or other medicine side effects.  Nutritional deficiencies, especially niacin, thiamine, vitamin C, or vitamin B.  Sudden drop in body temperature (hypothermia).  Change in routine, such as when traveling or hospitalized. SIGNS AND SYMPTOMS  People often describe their thinking as cloudy or unclear when they are confused. Confusion can also include feeling disoriented. That means you are unaware of where or who you are. You may also not know what the date or time is. If confused, you may also have difficulty paying attention, remembering, and making decisions. Some people also act aggressively when they are confused.  DIAGNOSIS  The medical evaluation of confusion may include:  Blood and urine tests.  X-rays.  Brain and nervous system tests.  Analyzing your brain waves (electroencephalogram or EEG).  Magnetic resonance imaging (MRI) of your head.  Computed tomography (CT) scan of your head.  Mental status tests in which your health care provider may ask many questions. Some of these questions may seem silly or strange, but they are a very important test to help  diagnose and treat confusion. TREATMENT  An admission to the hospital may not be needed, but a person with confusion should not be left alone. Stay with a family member or friend until the confusion clears. Avoid alcohol, pain relievers, or sedative drugs until you have fully recovered. Do not drive until directed by your health care provider. HOME CARE INSTRUCTIONS  What family and friends can do:  To find out if someone is confused, ask the person to state his or her name, age, and the date. If the person is unsure or answers incorrectly, he or she is confused.  Always introduce yourself, no matter how well the person knows you.  Often remind the person of his or her location.  Place a calendar and clock near the confused person.  Help the person with his or her medicines. You may want to use a pill box, an alarm as a reminder, or give the person each dose as prescribed.  Talk about current events and plans for the day.  Try to keep the environment calm, quiet, and peaceful.  Make sure the person keeps follow-up visits with his or her health care provider. PREVENTION  Ways to prevent confusion:  Avoid alcohol.  Eat a balanced diet.  Get enough sleep.  Take medicine only as directed by your health care provider.  Do not become isolated. Spend time with other people and make plans for your days.  Keep careful watch on your blood sugar levels if you are diabetic. SEEK IMMEDIATE MEDICAL CARE IF:   You develop severe headaches, repeated vomiting, seizures, blackouts, or slurred speech.  There is increasing confusion, weakness, numbness, restlessness, or personality changes.  You develop a loss of balance, have marked dizziness, feel uncoordinated, or fall.  You have delusions, hallucinations, or develop severe anxiety.  Your family members think you need to be rechecked. Document Released: 03/01/2004 Document Revised: 06/08/2013 Document Reviewed: 02/27/2013 [facility]Exit[facility]  Patient Information 2015 [place], [place]. This information is not intended to replace advice given to you by your health care provider. Make sure you discuss any questions you have with your health care provider.

## 2014-07-14 NOTE — ED Provider Notes (Signed)
CSN: [id number]     Arrival date & time 07/14/14  1127 History   First MD Initiated Contact with Patient 07/14/14 1334     Chief Complaint  Patient presents with  . Back Pain  . Abdominal Pain     (Consider location/radiation/quality/duration/timing/severity/associated sxs/prior Treatment) HPI   This is a 80 year old female with a past medical history of parkinsonism as diagnosed by neurology this past year, depression, anxiety. He shouldn't seems confused today and is a level V caveat. The history is given by her daughter, [name], whom I spoke on the phone and chart review. The daughter states she brought her mother in because she complained of not being able to walk. She has a past history of severe depression and a long-standing history of anxiety. Her husband died 5 years ago and she has had severe refractory depression since that time. Daughter reports that her symptoms seem to be intermittent. She frequently complains that she cannot walk, however, daughter states that if you are turned and she thinks she not watching. She can walk and do many things for herself. Patient's daughter states that they have a lot of family events of this weekend and she feels her mother did not want to attend and so is "seeking attention." Her daughter states that she did not seem confused to her. She is unsure if her mother is "putting on." She states that her mother has not had any recent fevers or infections.   Past Medical History  Diagnosis Date  . Anxiety   . High cholesterol   . Depression    Past Surgical History  Procedure Laterality Date  . Denies surgical history     Family History  Problem Relation Age of Onset  . Dementia Neg Hx   . Parkinsonism Neg Hx    History  Substance Use Topics  . Smoking status: Former Smoker    Quit date: 03/04/1996  . Smokeless tobacco: Not on file  . Alcohol Use: No   OB History    No data available     Review of Systems  Unable to perform ROS:  Mental status change     Allergies  Review of patient's allergies indicates no known allergies.  Home Medications   Prior to Admission medications   Medication Sig Start Date End Date Taking? Authorizing Provider  busPIRone (BUSPAR) 15 MG tablet Take 15 mg by mouth 2 (two) times daily.   Yes Historical Provider, MD  mirtazapine (REMERON) 15 MG tablet Take 15 mg by mouth at bedtime.   Yes Historical Provider, MD  cephALEXin (KEFLEX) 500 MG capsule Take 1 capsule (500 mg total) by mouth 4 (four) times daily. Patient not taking: Reported on 07/14/2014 05/15/14   Lorre Nick, MD  mirtazapine (REMERON) 30 MG tablet Take 1 tablet (30 mg total) by mouth at bedtime. Patient not taking: Reported on 07/14/2014 03/24/14   Everlene Farrier, PA-C   BP 131/69 mmHg  Pulse 88  Temp(Src) 98.3 F (36.8 C) (Oral)  Resp 18  SpO2 99% Physical Exam  Constitutional: She is oriented to person, place, and time. She appears well-developed and well-nourished. No distress.  Flat affect. Expressionless  HENT:  Head: Normocephalic and atraumatic.  Eyes: Conjunctivae are normal. No scleral icterus.  Neck: Normal range of motion.  Cardiovascular: Normal rate, regular rhythm and normal heart sounds.  Exam reveals no gallop and no friction rub.   No murmur heard. Pulmonary/Chest: Effort normal and breath sounds normal. No respiratory distress.  Abdominal: Soft.  Bowel sounds are normal. She exhibits no distension and no mass. There is no tenderness. There is no guarding.  Musculoskeletal:  Stiff legs. They appear locked and rigid. Patient will not allow me to range of motion. The hips. She presented pain with my attempt to internally rotate the right hip. No abdominal pain to palpation.  Neurological: She is alert and oriented to person, place, and time.  The patient seems confused. She gives contradictory statements. She states that she is confused. She tells me she has pain and cannot walk but then states she does  not have pain and that she can walk. She also states that she is anxious and stressed out. She is not sure why she is here.  Skin: Skin is warm and dry. She is not diaphoretic.  Nursing note and vitals reviewed.   ED Course  Procedures (including critical care time) Labs Review Labs Reviewed  COMPREHENSIVE METABOLIC PANEL - Abnormal; Notable for the following:    Glucose, Bld 126 (*)    BUN 22 (*)    Creatinine, Ser 1.10 (*)    AST 46 (*)    GFR calc non Af Amer 47 (*)    GFR calc Af Amer 55 (*)    All other components within normal limits  LIPASE, BLOOD - Abnormal; Notable for the following:    Lipase 18 (*)    All other components within normal limits  URINALYSIS, ROUTINE W REFLEX MICROSCOPIC (NOT AT [facility]) - Abnormal; Notable for the following:    Color, Urine AMBER (*)    APPearance TURBID (*)    Hgb urine dipstick MODERATE (*)    Leukocytes, UA SMALL (*)    All other components within normal limits  URINE MICROSCOPIC-ADD ON - Abnormal; Notable for the following:    Squamous Epithelial / LPF FEW (*)    Bacteria, UA FEW (*)    All other components within normal limits  CBC WITH DIFFERENTIAL/PLATELET  CBG MONITORING, ED  I-STAT CG4 LACTIC ACID, ED    Imaging Review Dg Chest 2 View  07/14/2014   CLINICAL DATA:  Back pain.  EXAM: CHEST  2 VIEW  COMPARISON:  Multiple exams, including 05/15/2014  FINDINGS: Mild degenerative AC joint arthropathy and degenerative glenohumeral arthropathy. Thoracic spondylosis. I do not observe a thoracic compression fracture.  Reverse lordotic projection. Heart size within normal limits for technique.  The lungs appear clear.  No pleural effusion.  IMPRESSION: 1. No acute/active findings. 2. Thoracic spondylosis and degenerative arthropathy in both shoulders.   Electronically Signed   By: Gaylyn RongWalter  Liebkemann M.D.   On: 07/14/2014 14:32     EKG Interpretation None      MDM   Final diagnoses:  Hip pain    Patient with mild elevation of cr  and bun  Her urine is equivocal, will send for  Culture CT-scan of the abdomen head is negative Negative CXR   Patient hip x-ray without acute abnormality. There is some which is not clinically relevant. The patient has gotten out of her whole bed several times to ask the nurse if she can leave now and is ambulating freely in the emergency department. Appears that she is able to walk. Her workup is negative for acute abnormality. Urine sent for culture. Patient can follow-up with her primary care physician. Her daughter has been called to pick her up.    Arthor Captainbigail Telitha Plath, PA-C 07/14/14 1620

## 2014-07-14 NOTE — ED Notes (Signed)
I will get blood and CBG when patient returns

## 2014-07-14 NOTE — Telephone Encounter (Signed)
Called and spoke with Elita QuickPam, pt daughter and made f/u appt for 11/01/14 at 11:15am for arrival at 11:00am. She verbalized understanding and I told her they will call to remind her before the appt.

## 2014-07-14 NOTE — Telephone Encounter (Signed)
Called and spoke with daughter to set up f/u appt with Dr. Lucia GaskinsAhern in September since we have not heard back to reschedule this. She asked I call back later today to get this scheduled. I told her I will try and if not today, I will call her tomorrow. She verbalized understanding.

## 2014-07-14 NOTE — ED Notes (Signed)
Pt c/o back pain, side pain, "abd gurgling" for past couple of days. Pt denies n/v/d or constipation or problems with urination.

## 2014-07-14 NOTE — ED Provider Notes (Signed)
Medical screening examination/treatment/procedure(s) were conducted as a shared visit with non-physician practitioner(s) and myself.  I personally evaluated the patient during the encounter.   EKG Interpretation None      Results for orders placed or performed during the hospital encounter of 07/14/14  CBC with Differential  Result Value Ref Range   WBC 4.6 4.0 - 10.5 K/uL   RBC 4.72 3.87 - 5.11 MIL/uL   Hemoglobin 13.5 12.0 - 15.0 g/dL   HCT 66.441.8 40.336.0 - 47.446.0 %   MCV 88.6 78.0 - 100.0 fL   MCH 28.6 26.0 - 34.0 pg   MCHC 32.3 30.0 - 36.0 g/dL   RDW 25.912.2 56.311.5 - 87.515.5 %   Platelets 230 150 - 400 K/uL   Neutrophils Relative % 59 43 - 77 %   Neutro Abs 2.6 1.7 - 7.7 K/uL   Lymphocytes Relative 29 12 - 46 %   Lymphs Abs 1.3 0.7 - 4.0 K/uL   Monocytes Relative 12 3 - 12 %   Monocytes Absolute 0.6 0.1 - 1.0 K/uL   Eosinophils Relative 0 0 - 5 %   Eosinophils Absolute 0.0 0.0 - 0.7 K/uL   Basophils Relative 0 0 - 1 %   Basophils Absolute 0.0 0.0 - 0.1 K/uL  Comprehensive metabolic panel  Result Value Ref Range   Sodium 139 135 - 145 mmol/L   Potassium 4.0 3.5 - 5.1 mmol/L   Chloride 105 101 - 111 mmol/L   CO2 25 22 - 32 mmol/L   Glucose, Bld 126 (H) 65 - 99 mg/dL   BUN 22 (H) 6 - 20 mg/dL   Creatinine, Ser 6.431.10 (H) 0.44 - 1.00 mg/dL   Calcium 9.0 8.9 - 32.910.3 mg/dL   Total Protein 6.8 6.5 - 8.1 g/dL   Albumin 3.5 3.5 - 5.0 g/dL   AST 46 (H) 15 - 41 U/L   ALT 19 14 - 54 U/L   Alkaline Phosphatase 59 38 - 126 U/L   Total Bilirubin 0.6 0.3 - 1.2 mg/dL   GFR calc non Af Amer 47 (L) >60 mL/min   GFR calc Af Amer 55 (L) >60 mL/min   Anion gap 9 5 - 15  Lipase, blood  Result Value Ref Range   Lipase 18 (L) 22 - 51 U/L  Urinalysis, Routine w reflex microscopic (not at [facility])  Result Value Ref Range   Color, Urine AMBER (A) YELLOW   APPearance TURBID (A) CLEAR   Specific Gravity, Urine 1.027 1.005 - 1.030   pH 5.5 5.0 - 8.0   Glucose, UA NEGATIVE NEGATIVE mg/dL   Hgb urine  dipstick MODERATE (A) NEGATIVE   Bilirubin Urine NEGATIVE NEGATIVE   Ketones, ur NEGATIVE NEGATIVE mg/dL   Protein, ur NEGATIVE NEGATIVE mg/dL   Urobilinogen, UA 1.0 0.0 - 1.0 mg/dL   Nitrite NEGATIVE NEGATIVE   Leukocytes, UA SMALL (A) NEGATIVE  Urine microscopic-add on  Result Value Ref Range   Squamous Epithelial / LPF FEW (A) RARE   WBC, UA 3-6 <3 WBC/hpf   RBC / HPF 3-6 <3 RBC/hpf   Bacteria, UA FEW (A) RARE   Urine-Other MUCOUS PRESENT    No results found.  Patient seen by me. Patient brought in by family member. Patient lives with her daughter. Patient has a history of some sort of psycho motor problem had been evaluated for possible Parkinson's by neurology and apparently that was not the case. They feel it may be related to a depressive disorder. Patient would not walk  today this is why daughter brought her in. But in the waiting room patient did get up and walk to triage. We'll do with altered mental status workup to make sure that there is nothing acute or life-threatening ongoing to include head CT chest x-ray and lab work. Everything is negative patient be discharged back home with follow-up with neurology.  Vanetta Mulders, MD 07/14/14 1410

## 2014-07-15 LAB — URINE CULTURE
Colony Count: >=100000
Special Requests: NORMAL

## 2014-07-26 ENCOUNTER — Emergency Department (HOSPITAL_COMMUNITY): Payer: Medicare Other

## 2014-07-26 ENCOUNTER — Encounter (HOSPITAL_COMMUNITY): Payer: Self-pay | Admitting: Emergency Medicine

## 2014-07-26 ENCOUNTER — Emergency Department (HOSPITAL_COMMUNITY)
Admission: EM | Admit: 2014-07-26 | Discharge: 2014-07-26 | Disposition: A | Discharge status: Home / Self Care | Payer: Medicare Other | Attending: Emergency Medicine | Admitting: Emergency Medicine

## 2014-07-26 DIAGNOSIS — M25473 Effusion, unspecified ankle: Secondary | ICD-10-CM | POA: Diagnosis not present

## 2014-07-26 DIAGNOSIS — R600 Localized edema: Secondary | ICD-10-CM | POA: Diagnosis not present

## 2014-07-26 DIAGNOSIS — Z87891 Personal history of nicotine dependence: Secondary | ICD-10-CM | POA: Diagnosis not present

## 2014-07-26 DIAGNOSIS — Z79899 Other long term (current) drug therapy: Secondary | ICD-10-CM | POA: Diagnosis not present

## 2014-07-26 DIAGNOSIS — Z8639 Personal history of other endocrine, nutritional and metabolic disease: Secondary | ICD-10-CM | POA: Diagnosis not present

## 2014-07-26 DIAGNOSIS — F419 Anxiety disorder, unspecified: Secondary | ICD-10-CM | POA: Diagnosis not present

## 2014-07-26 DIAGNOSIS — R5381 Other malaise: Secondary | ICD-10-CM | POA: Diagnosis not present

## 2014-07-26 DIAGNOSIS — M7989 Other specified soft tissue disorders: Secondary | ICD-10-CM | POA: Diagnosis present

## 2014-07-26 DIAGNOSIS — R609 Edema, unspecified: Secondary | ICD-10-CM

## 2014-07-26 LAB — COMPREHENSIVE METABOLIC PANEL
ALBUMIN: 3.3 g/dL — AB (ref 3.5–5.0)
ALK PHOS: 56 U/L (ref 38–126)
ALT: 22 U/L (ref 14–54)
ANION GAP: 7 (ref 5–15)
AST: 44 U/L — AB (ref 15–41)
BILIRUBIN TOTAL: 0.7 mg/dL (ref 0.3–1.2)
BUN: 21 mg/dL — ABNORMAL HIGH (ref 6–20)
CHLORIDE: 105 mmol/L (ref 101–111)
CO2: 28 mmol/L (ref 22–32)
Calcium: 9 mg/dL (ref 8.9–10.3)
Creatinine, Ser: 1.02 mg/dL — ABNORMAL HIGH (ref 0.44–1.00)
GFR calc non Af Amer: 52 mL/min — ABNORMAL LOW (ref >60)
GFR, EST AFRICAN AMERICAN: 60 mL/min — AB (ref >60)
Glucose, Bld: 102 mg/dL — ABNORMAL HIGH (ref 65–99)
POTASSIUM: 4.4 mmol/L (ref 3.5–5.1)
SODIUM: 140 mmol/L (ref 135–145)
Total Protein: 6.9 g/dL (ref 6.5–8.1)

## 2014-07-26 LAB — URINALYSIS, ROUTINE W REFLEX MICROSCOPIC
BILIRUBIN URINE: NEGATIVE
Glucose, UA: NEGATIVE mg/dL
Hgb urine dipstick: NEGATIVE
KETONES UR: NEGATIVE mg/dL
LEUKOCYTES UA: NEGATIVE
NITRITE: NEGATIVE
Protein, ur: NEGATIVE mg/dL
Specific Gravity, Urine: 1.009 (ref 1.005–1.030)
UROBILINOGEN UA: 0.2 mg/dL (ref 0.0–1.0)
pH: 6 (ref 5.0–8.0)

## 2014-07-26 LAB — CBC WITH DIFFERENTIAL/PLATELET
BASOS PCT: 0 % (ref 0–1)
Basophils Absolute: 0 10*3/uL (ref 0.0–0.1)
Eosinophils Absolute: 0.1 10*3/uL (ref 0.0–0.7)
Eosinophils Relative: 3 % (ref 0–5)
HCT: 42.2 % (ref 36.0–46.0)
HEMOGLOBIN: 13.6 g/dL (ref 12.0–15.0)
LYMPHS ABS: 1.7 10*3/uL (ref 0.7–4.0)
Lymphocytes Relative: 39 % (ref 12–46)
MCH: 28.8 pg (ref 26.0–34.0)
MCHC: 32.2 g/dL (ref 30.0–36.0)
MCV: 89.4 fL (ref 78.0–100.0)
MONO ABS: 0.6 10*3/uL (ref 0.1–1.0)
MONOS PCT: 13 % — AB (ref 3–12)
NEUTROS PCT: 45 % (ref 43–77)
Neutro Abs: 1.8 10*3/uL (ref 1.7–7.7)
Platelets: 235 10*3/uL (ref 150–400)
RBC: 4.72 MIL/uL (ref 3.87–5.11)
RDW: 12.3 % (ref 11.5–15.5)
WBC: 4.2 10*3/uL (ref 4.0–10.5)

## 2014-07-26 LAB — I-STAT CHEM 8, ED
BUN: 21 mg/dL — ABNORMAL HIGH (ref 6–20)
CREATININE: 1.1 mg/dL — AB (ref 0.44–1.00)
Calcium, Ion: 1.16 mmol/L (ref 1.13–1.30)
Chloride: 103 mmol/L (ref 101–111)
Glucose, Bld: 101 mg/dL — ABNORMAL HIGH (ref 65–99)
HCT: 44 % (ref 36.0–46.0)
Hemoglobin: 15 g/dL (ref 12.0–15.0)
Potassium: 4.3 mmol/L (ref 3.5–5.1)
Sodium: 138 mmol/L (ref 135–145)
TCO2: 26 mmol/L (ref 0–100)

## 2014-07-26 LAB — RAPID URINE DRUG SCREEN, HOSP PERFORMED
Amphetamines: NOT DETECTED
BARBITURATES: NOT DETECTED
BENZODIAZEPINES: NOT DETECTED
COCAINE: NOT DETECTED
OPIATES: NOT DETECTED
TETRAHYDROCANNABINOL: NOT DETECTED

## 2014-07-26 MED ORDER — SODIUM CHLORIDE 0.9 % IV SOLN
INTRAVENOUS | Status: DC
  Administered 2014-07-26: 16:00:00 via INTRAVENOUS

## 2014-07-26 MED ORDER — SODIUM CHLORIDE 0.9 % IV BOLUS (SEPSIS)
500.0000 mL | Freq: Once | INTRAVENOUS | Status: AC
  Administered 2014-07-26: 500 mL via INTRAVENOUS

## 2014-07-26 NOTE — ED Provider Notes (Signed)
CSN: [id number]     Arrival date & time 07/26/14  1113 History   First MD Initiated Contact with Patient 07/26/14 1506     Chief Complaint  Patient presents with  . Foot Swelling  . Fatigue     (Consider location/radiation/quality/duration/timing/severity/associated sxs/prior Treatment) HPI   Susan Simmons is a 80 y.o. female was here for evaluation of weakness, trouble walking. Blisters on left arm, swelling in feet and decreased appetite. Symptoms have a variable onset over the last 2 weeks. Per the daughter. She is walking with a "shuffling gait. She has not eaten much in 4 days. The daughter noticed blisters on the left upper arm, 3 days ago without known trauma. Patient has been more sleepy than usual. Her daughter is able to help her walk. She's not seen her doctor recently. No recent fever, chills, nausea, vomiting, cough, shortness of breath or chest pain. Patient is unable to give history. There are no other known modifying factors.   Past Medical History  Diagnosis Date  . Anxiety   . High cholesterol   . Depression    Past Surgical History  Procedure Laterality Date  . Denies surgical history     Family History  Problem Relation Age of Onset  . Dementia Neg Hx   . Parkinsonism Neg Hx    History  Substance Use Topics  . Smoking status: Former Smoker    Quit date: 03/04/1996  . Smokeless tobacco: Not on file  . Alcohol Use: No   OB History    No data available     Review of Systems  Unable to perform ROS: Mental status change      Allergies  Review of patient's allergies indicates no known allergies.  Home Medications   Prior to Admission medications   Medication Sig Start Date End Date Taking? Authorizing Provider  busPIRone (BUSPAR) 15 MG tablet Take 15 mg by mouth 2 (two) times daily.   Yes Historical Provider, MD  mirtazapine (REMERON) 15 MG tablet Take 15 mg by mouth at bedtime.   Yes Historical Provider, MD  cephALEXin (KEFLEX) 500 MG capsule  Take 1 capsule (500 mg total) by mouth 4 (four) times daily. Patient not taking: Reported on 07/14/2014 05/15/14   Lorre Nick, MD  mirtazapine (REMERON) 30 MG tablet Take 1 tablet (30 mg total) by mouth at bedtime. Patient not taking: Reported on 07/14/2014 03/24/14   Everlene Farrier, PA-C   BP 121/40 mmHg  Pulse 76  Temp(Src) 97.5 F (36.4 C) (Oral)  Resp 13  SpO2 98% Physical Exam  Constitutional: She appears well-developed. She appears distressed (She appears depressed and is mostly nonverbal, answering only very short questions.).  Elderly, frail  HENT:  Head: Normocephalic and atraumatic.  Right Ear: External ear normal.  Left Ear: External ear normal.  Eyes: Conjunctivae and EOM are normal. Pupils are equal, round, and reactive to light.  Neck: Normal range of motion and phonation normal. Neck supple.  Cardiovascular: Normal rate, regular rhythm and normal heart sounds.   Pulmonary/Chest: Effort normal and breath sounds normal. She exhibits no bony tenderness.  Abdominal: Soft. There is no tenderness.  Musculoskeletal: Normal range of motion.  Mild edema of lower legs, left greater than right. This edema is confined mostly to the ankle region.  Neurological: She is alert. No cranial nerve deficit or sensory deficit. She exhibits normal muscle tone. Coordination normal.  No dysarthria or aphasia. No nystagmus. He is able to independently elevate her arms and legs, bilaterally,  with 4 over 5 strength in each.  Skin: Skin is warm, dry and intact.  Psychiatric:  Minimal responsiveness. She answers questions better from her daughter, than from me.  Nursing note and vitals reviewed.   ED Course  Procedures (including critical care time)  Medications  0.9 %  sodium chloride infusion ( Intravenous New Bag/Given 07/26/14 1624)  sodium chloride 0.9 % bolus 500 mL (0 mLs Intravenous Stopped 07/26/14 1703)    Patient Vitals for the past 24 hrs:  BP Temp Temp src Pulse Resp SpO2   07/26/14 1709 (!) 121/40 mmHg 97.5 F (36.4 C) Oral 76 13 98 %  07/26/14 1121 109/58 mmHg 98.2 F (36.8 C) Oral 73 16 100 %    7:28 PM Reevaluation with update and discussion. After initial assessment and treatment, an updated evaluation reveals attempt for ambulation failed because the patient would not stand up. There's been no change in her clinical status, otherwise. [name]   20:00- daughter does not feel that she can manage the patient home. She wants patient to rehabilitation because she previously had a successful rehabilitation stay after a similar episode. Case manager consult for placement. They suggested that the patient be evaluated in her home by home health services, social work and nursing. They will be able to help her and arrange for placement as needed.  Findings and plan discussed with patient's daughter, all questions answered.   Labs Review Labs Reviewed  COMPREHENSIVE METABOLIC PANEL - Abnormal; Notable for the following:    Glucose, Bld 102 (*)    BUN 21 (*)    Creatinine, Ser 1.02 (*)    Albumin 3.3 (*)    AST 44 (*)    GFR calc non Af Amer 52 (*)    GFR calc Af Amer 60 (*)    All other components within normal limits  CBC WITH DIFFERENTIAL/PLATELET - Abnormal; Notable for the following:    Monocytes Relative 13 (*)    All other components within normal limits  I-STAT CHEM 8, ED - Abnormal; Notable for the following:    BUN 21 (*)    Creatinine, Ser 1.10 (*)    Glucose, Bld 101 (*)    All other components within normal limits  URINE CULTURE  URINALYSIS, ROUTINE W REFLEX MICROSCOPIC (NOT AT [facility])  URINE RAPID DRUG SCREEN, HOSP PERFORMED    Imaging Review Dg Chest 2 View  07/26/2014   CLINICAL DATA:  Swollen RIGHT foot since Friday, water blisters on LEFT forearm, decreased appetite, increased lethargy, fatigue, former smoker  EXAM: CHEST  2 VIEW  COMPARISON:  07/14/2014  FINDINGS: Borderline enlargement of cardiac silhouette.  Mediastinal  contours and pulmonary vascularity normal.  Mild RIGHT basilar atelectasis.  Lungs otherwise clear.  No pleural effusion or pneumothorax.  Multilevel endplate spur formation thoracic spine.  IMPRESSION: No acute abnormalities.   Electronically Signed   By: Ulyses Southward M.D.   On: 07/26/2014 16:02   Dg Ankle Complete Right  07/26/2014   CLINICAL DATA:  Pain without trauma.  Swelling.  Difficulty walking.  EXAM: RIGHT ANKLE - COMPLETE 3+ VIEW  COMPARISON:  None.  FINDINGS: Diffuse mild soft tissue swelling. Small calcaneal spur. Lateral view suboptimal secondary to positioning. Joint spaces are maintained for age. Base of fifth metatarsal and talar dome intact.  IMPRESSION: Mild soft tissue swelling, without acute osseous finding.   Electronically Signed   By: Jeronimo Greaves M.D.   On: 07/26/2014 12:34   Ct Head Wo Contrast  07/26/2014   CLINICAL DATA:  Fatigue. Decreased appetite. Lethargy. Onset of symptoms 07/23/2014.  EXAM: CT HEAD WITHOUT CONTRAST  TECHNIQUE: Contiguous axial images were obtained from the base of the skull through the vertex without intravenous contrast.  COMPARISON:  07/14/2014.  FINDINGS: No mass lesion, mass effect, midline shift, hydrocephalus, hemorrhage. No acute territorial cortical ischemia/infarct. Atrophy and chronic ischemic white matter disease is present. The calvarium is intact. Mastoid air cells are clear.  IMPRESSION: Mild atrophy and chronic ischemic white matter disease without acute intracranial abnormality.   Electronically Signed   By: Andreas Newport M.D.   On: 07/26/2014 15:49     EKG Interpretation   Date/Time:  Monday July 26 2014 16:12:32 EDT Ventricular Rate:  69 PR Interval:  144 QRS Duration: 96 QT Interval:  414 QTC Calculation: 443 R Axis:   -39 Text Interpretation:  Sinus rhythm LVH with secondary repolarization  abnormality since last tracing no significant change Confirmed by Effie Shy   MD, Iyari Hagner ([id number]) on 07/26/2014 4:51:29 PM      MDM    Final diagnoses:  Malaise  Peripheral edema     Nonspecific leg swelling. Walking disorder nonspecific. Patient may be a candidate for skilled nursing facility or rehabilitation placement. This can be managed from the home setting as she has good home services and support, and no acute medical problems on evaluation today.   Nursing Notes Reviewed/ Care Coordinated Applicable Imaging Reviewed Interpretation of Laboratory Data incorporated into ED treatment  The patient appears reasonably screened and/or stabilized for discharge and I doubt any other medical condition or other [facility] requiring further screening, evaluation, or treatment in the ED at this time prior to discharge.  Plan: Home Medications- usual; Home Treatments- rest; return here if the recommended treatment, does not improve the symptoms; Recommended follow up- PCP asap     Mancel Bale, MD 07/26/14 2102

## 2014-07-26 NOTE — ED Notes (Signed)
Per pt/daughter, right foot swollen since Friday-left forearm has water blisters-states decreased appetite and increased lethargy

## 2014-07-26 NOTE — Discharge Instructions (Signed)
Home health service will contact you tomorrow to help arrange for evaluation and possible placement into rehabilitation. Contact your doctor to help with these arrangements.   Fatigue Fatigue is a feeling of tiredness, lack of energy, lack of motivation, or feeling tired all the time. Having enough rest, good nutrition, and reducing stress will normally reduce fatigue. Consult your caregiver if it persists. The nature of your fatigue will help your caregiver to find out its cause. The treatment is based on the cause.  CAUSES  There are many causes for fatigue. Most of the time, fatigue can be traced to one or more of your habits or routines. Most causes fit into one or more of three general areas. They are: Lifestyle problems  Sleep disturbances.  Overwork.  Physical exertion.  Unhealthy habits.  Poor eating habits or eating disorders.  Alcohol and/or drug use .  Lack of proper nutrition (malnutrition). Psychological problems  Stress and/or anxiety problems.  Depression.  Grief.  Boredom. Medical Problems or Conditions  Anemia.  Pregnancy.  Thyroid gland problems.  Recovery from major surgery.  Continuous pain.  Emphysema or asthma that is not well controlled  Allergic conditions.  Diabetes.  Infections (such as mononucleosis).  Obesity.  Sleep disorders, such as sleep apnea.  Heart failure or other heart-related problems.  Cancer.  Kidney disease.  Liver disease.  Effects of certain medicines such as antihistamines, cough and cold remedies, prescription pain medicines, heart and blood pressure medicines, drugs used for treatment of cancer, and some antidepressants. SYMPTOMS  The symptoms of fatigue include:   Lack of energy.  Lack of drive (motivation).  Drowsiness.  Feeling of indifference to the surroundings. DIAGNOSIS  The details of how you feel help guide your caregiver in finding out what is causing the fatigue. You will be asked  about your present and past health condition. It is important to review all medicines that you take, including prescription and non-prescription items. A thorough exam will be done. You will be questioned about your feelings, habits, and normal lifestyle. Your caregiver may suggest blood tests, urine tests, or other tests to look for common medical causes of fatigue.  TREATMENT  Fatigue is treated by correcting the underlying cause. For example, if you have continuous pain or depression, treating these causes will improve how you feel. Similarly, adjusting the dose of certain medicines will help in reducing fatigue.  HOME CARE INSTRUCTIONS   Try to get the required amount of good sleep every night.  Eat a healthy and nutritious diet, and drink enough water throughout the day.  Practice ways of relaxing (including yoga or meditation).  Exercise regularly.  Make plans to change situations that cause stress. Act on those plans so that stresses decrease over time. Keep your work and personal routine reasonable.  Avoid street drugs and minimize use of alcohol.  Start taking a daily multivitamin after consulting your caregiver. SEEK MEDICAL CARE IF:   You have persistent tiredness, which cannot be accounted for.  You have fever.  You have unintentional weight loss.  You have headaches.  You have disturbed sleep throughout the night.  You are feeling sad.  You have constipation.  You have dry skin.  You have gained weight.  You are taking any new or different medicines that you suspect are causing fatigue.  You are unable to sleep at night.  You develop any unusual swelling of your legs or other parts of your body. SEEK IMMEDIATE MEDICAL CARE IF:   You  are feeling confused.  Your vision is blurred.  You feel faint or pass out.  You develop severe headache.  You develop severe abdominal, pelvic, or back pain.  You develop chest pain, shortness of breath, or an irregular  or fast heartbeat.  You are unable to pass a normal amount of urine.  You develop abnormal bleeding such as bleeding from the rectum or you vomit blood.  You have thoughts about harming yourself or committing suicide.  You are worried that you might harm someone else. MAKE SURE YOU:   Understand these instructions.  Will watch your condition.  Will get help right away if you are not doing well or get worse. Document Released: 11/19/2006 Document Revised: 04/16/2011 Document Reviewed: 05/26/2013 [facility] Patient Information 2015 [place], [place]. This information is not intended to replace advice given to you by your health care provider. Make sure you discuss any questions you have with your health care provider.

## 2014-07-26 NOTE — Care Management Note (Addendum)
Case Management Note  Patient Details  Name: Susan Simmons MRN: [id number] Date of Birth: 1934-08-15  Subjective/Objective:   Patient presents to Ed with increased weakness and swollen foot                 Action/Plan: Discussed home health services with patient's daughter Susan Simmons   Expected Discharge Date:                  Expected Discharge Plan:  Home w Home Health Services  In-House Referral:  Clinical Social Work  Discharge planning Services     Post Acute Care Choice:  Home Health Choice offered to:  Adult Children  DME Arranged:   rolling walker DME Agency:   [facility]  HH Arranged:  RN, PT, OT, Nurse's Aide (social work) [employer] Agency:  [facility]  Status of Service:  Completed, signed off  Medicare Important Message Given:    Date Medicare IM Given:    Medicare IM give by:    Date Additional Medicare IM Given:    Additional Medicare Important Message give by:     If discussed at Long Length of Stay Meetings, dates discussed:    Additional Comments: Patient lives at home with her daughter Susan Simmons.  Patient has had homevhealth services in [place] in the past. Patient has a cane at home no further equipment.  EDCM offered to order bedside commode and wheelchair for patient, however patient's daughter refuse.  [name] is agreeable to have walker ordered for patient.  [name] reports she assists patient with her ADL's and meals.  EDCM provided patient's daughter with list of home health agencies in [facility].  Patient's daughter is without preference, [facility] chosen for home health services.  EDCM explained to patient's daughter that home health orders for RN, PT, OT aide and social worker have been ordered.  EDCM explained to patient's daughter that social worker will be able to assist with placement from the facility.  Patient's daughter also agreeable to have [facility] if patient qualifies.  Patient's duaghter thankful for services.  Discussed patient with EDP.  No further  EDCM needs at this time.  Home health oreders and order for rolling walker faxed to [facility] at 2207pm with confirmation of receipt at 2210pm  [facility], [place], RN 07/26/2014, 10:05 PM

## 2014-07-26 NOTE — ED Notes (Signed)
Patient transported to CT 

## 2014-07-26 NOTE — ED Notes (Addendum)
Pt very drowsy.  Told patient we were going to try to walk.  She is extremely weak and unable to focus.  I asked her if she remembered what we were going to do and she said No.  There is no way even with max assist that Susan Simmons is going to be able to ambulate at this time.  Daughter at bedside

## 2014-07-26 NOTE — ED Notes (Signed)
Pt also has scattered blisters on left arm.  Daughter states she thinks it could possibly be a reaction to a new powder she has been using

## 2014-07-26 NOTE — ED Notes (Signed)
Patient in xray unable to collect labs at this time. 

## 2014-07-26 NOTE — Progress Notes (Signed)
CSW attempted to meet with pt at bedside. However, she was asleep. Daughter was present.  CSW spoke with daughter who states that pt presents to [facility] due swollen foot, and water bumps.  Daughter informed CSW that the pt currently lives with her and has been since November of last year. Also, she states that last time the pt fell was June 9 and that she has been assisting the pt with completing her ADL's since June 8th.  Daughter states that she is the pt's primary and only support. She states that she is interested in the pt going to a facility in the future for rehab. CSW spoke with EDP who states that he will put in home health orders and add a social worker to her care plan in order for the pt to be admitted to a facility.  CSW will give daughter a list of facilities.   Trish Mage [phone] ED CSW 07/26/2014 9:46 PM

## 2014-07-26 NOTE — ED Notes (Signed)
Patient transported to X-ray 

## 2014-07-28 LAB — URINE CULTURE
Culture: <10000
Special Requests: NORMAL

## 2014-08-01 ENCOUNTER — Emergency Department (HOSPITAL_COMMUNITY): Payer: Medicare Other

## 2014-08-01 ENCOUNTER — Inpatient Hospital Stay (HOSPITAL_COMMUNITY)
Admission: EM | Admit: 2014-08-01 | Discharge: 2014-08-05 | DRG: 689 | Disposition: A | Discharge status: Skilled Nursing Facility | Payer: Medicare Other | Attending: Internal Medicine | Admitting: Internal Medicine

## 2014-08-01 ENCOUNTER — Encounter (HOSPITAL_COMMUNITY): Payer: Self-pay | Admitting: General Practice

## 2014-08-01 DIAGNOSIS — E780 Pure hypercholesterolemia: Secondary | ICD-10-CM | POA: Diagnosis present

## 2014-08-01 DIAGNOSIS — B9620 Unspecified Escherichia coli [E. coli] as the cause of diseases classified elsewhere: Secondary | ICD-10-CM | POA: Diagnosis present

## 2014-08-01 DIAGNOSIS — Z7401 Bed confinement status: Secondary | ICD-10-CM

## 2014-08-01 DIAGNOSIS — N390 Urinary tract infection, site not specified: Principal | ICD-10-CM | POA: Diagnosis present

## 2014-08-01 DIAGNOSIS — G9340 Encephalopathy, unspecified: Secondary | ICD-10-CM | POA: Diagnosis present

## 2014-08-01 DIAGNOSIS — G20 Parkinson's disease: Secondary | ICD-10-CM | POA: Diagnosis present

## 2014-08-01 DIAGNOSIS — W19XXXA Unspecified fall, initial encounter: Secondary | ICD-10-CM | POA: Diagnosis not present

## 2014-08-01 DIAGNOSIS — R410 Disorientation, unspecified: Secondary | ICD-10-CM | POA: Diagnosis present

## 2014-08-01 DIAGNOSIS — F329 Major depressive disorder, single episode, unspecified: Secondary | ICD-10-CM | POA: Diagnosis present

## 2014-08-01 DIAGNOSIS — F32A Depression, unspecified: Secondary | ICD-10-CM | POA: Diagnosis present

## 2014-08-01 DIAGNOSIS — R531 Weakness: Secondary | ICD-10-CM | POA: Diagnosis not present

## 2014-08-01 DIAGNOSIS — Z87891 Personal history of nicotine dependence: Secondary | ICD-10-CM

## 2014-08-01 DIAGNOSIS — R29898 Other symptoms and signs involving the musculoskeletal system: Secondary | ICD-10-CM

## 2014-08-01 DIAGNOSIS — R4182 Altered mental status, unspecified: Secondary | ICD-10-CM | POA: Diagnosis not present

## 2014-08-01 LAB — CBC
HCT: 38 % (ref 36.0–46.0)
Hemoglobin: 12.2 g/dL (ref 12.0–15.0)
MCH: 28.4 pg (ref 26.0–34.0)
MCHC: 32.1 g/dL (ref 30.0–36.0)
MCV: 88.4 fL (ref 78.0–100.0)
Platelets: 281 10*3/uL (ref 150–400)
RBC: 4.3 MIL/uL (ref 3.87–5.11)
RDW: 12.1 % (ref 11.5–15.5)
WBC: 4.2 10*3/uL (ref 4.0–10.5)

## 2014-08-01 LAB — URINALYSIS, ROUTINE W REFLEX MICROSCOPIC
Bilirubin Urine: NEGATIVE
Glucose, UA: NEGATIVE mg/dL
Ketones, ur: 15 mg/dL — AB
NITRITE: POSITIVE — AB
Protein, ur: NEGATIVE mg/dL
Specific Gravity, Urine: 1.022 (ref 1.005–1.030)
Urobilinogen, UA: 1 mg/dL (ref 0.0–1.0)
pH: 6 (ref 5.0–8.0)

## 2014-08-01 LAB — BASIC METABOLIC PANEL
Anion gap: 7 (ref 5–15)
BUN: 17 mg/dL (ref 6–20)
CO2: 27 mmol/L (ref 22–32)
Calcium: 8.9 mg/dL (ref 8.9–10.3)
Chloride: 105 mmol/L (ref 101–111)
Creatinine, Ser: 0.95 mg/dL (ref 0.44–1.00)
GFR calc Af Amer: >60 mL/min (ref >60)
GFR calc non Af Amer: 56 mL/min — ABNORMAL LOW (ref >60)
Glucose, Bld: 100 mg/dL — ABNORMAL HIGH (ref 65–99)
Potassium: 4 mmol/L (ref 3.5–5.1)
Sodium: 139 mmol/L (ref 135–145)

## 2014-08-01 LAB — URINE MICROSCOPIC-ADD ON

## 2014-08-01 MED ORDER — ONDANSETRON HCL 4 MG PO TABS
4.0000 mg | ORAL_TABLET | Freq: Four times a day (QID) | ORAL | Status: DC | PRN
Start: 2014-08-01 — End: 2014-08-05

## 2014-08-01 MED ORDER — MIRTAZAPINE 15 MG PO TABS
15.0000 mg | ORAL_TABLET | Freq: Every day | ORAL | Status: DC
  Administered 2014-08-01 – 2014-08-03 (×3): 15 mg via ORAL
  Filled 2014-08-01 (×5): qty 1

## 2014-08-01 MED ORDER — BUSPIRONE HCL 15 MG PO TABS
15.0000 mg | ORAL_TABLET | Freq: Two times a day (BID) | ORAL | Status: DC
  Administered 2014-08-01 – 2014-08-05 (×7): 15 mg via ORAL
  Filled 2014-08-01 (×9): qty 1

## 2014-08-01 MED ORDER — SODIUM CHLORIDE 0.9 % IV SOLN
INTRAVENOUS | Status: DC
  Administered 2014-08-01 – 2014-08-02 (×3): via INTRAVENOUS

## 2014-08-01 MED ORDER — DEXTROSE 5 % IV SOLN
1.0000 g | Freq: Once | INTRAVENOUS | Status: AC
  Administered 2014-08-01: 1 g via INTRAVENOUS
  Filled 2014-08-01: qty 10

## 2014-08-01 MED ORDER — POLYETHYLENE GLYCOL 3350 17 G PO PACK
17.0000 g | PACK | Freq: Every day | ORAL | Status: DC | PRN
Start: 2014-08-01 — End: 2014-08-05

## 2014-08-01 MED ORDER — ACETAMINOPHEN 325 MG PO TABS: 650.0000 mg | ORAL_TABLET | Freq: Four times a day (QID) | ORAL | Status: DC | PRN

## 2014-08-01 MED ORDER — BISACODYL 10 MG RE SUPP: 10.0000 mg | Freq: Every day | RECTAL | Status: DC | PRN

## 2014-08-01 MED ORDER — HEPARIN SODIUM (PORCINE) 5000 UNIT/ML IJ SOLN
5000.0000 [IU] | Freq: Three times a day (TID) | INTRAMUSCULAR | Status: DC
  Administered 2014-08-01 – 2014-08-05 (×12): 5000 [IU] via SUBCUTANEOUS
  Filled 2014-08-01 (×15): qty 1

## 2014-08-01 MED ORDER — ONDANSETRON HCL 4 MG/2ML IJ SOLN: 4.0000 mg | Freq: Four times a day (QID) | INTRAMUSCULAR | Status: DC | PRN

## 2014-08-01 MED ORDER — ACETAMINOPHEN 650 MG RE SUPP: 650.0000 mg | Freq: Four times a day (QID) | RECTAL | Status: DC | PRN

## 2014-08-01 MED ORDER — CEFTRIAXONE SODIUM IN DEXTROSE 20 MG/ML IV SOLN
1.0000 g | INTRAVENOUS | Status: DC
  Administered 2014-08-02 – 2014-08-03 (×2): 1 g via INTRAVENOUS
  Filled 2014-08-01 (×2): qty 50

## 2014-08-01 NOTE — ED Notes (Signed)
PA at bedside.

## 2014-08-01 NOTE — ED Provider Notes (Signed)
CSN: [id number]     Arrival date & time 08/01/14  1055 History   First MD Initiated Contact with Patient 08/01/14 1135     Chief Complaint  Patient presents with  . Fall   Level V caveat: altered mental status.   Susan Simmons is a 80 y.o. female who presents to the emergency room after she fell while walking to the bathroom by herself. Patient reports she lives at home with her daughter who helps her usually. Patient is unsure how she fell and whether or not she tripped or not. She reports landing on her right forehead and side. She is complaining of 8 out of 10 pain to her right forehead. Patient appears depressed has poor eye contact. She is unsure of the exact events leading to her fall and what day or month it is. The patient denies fevers, cough, chest pain, shortness of breath, numbness, tingling, weakness, neck pain or back pain. History is somewhat unreliable as she is slightly confused. I'm unable to contact her daughter ham. Patient denies taking anticoagulants. Questionable syncope. I called and spoke with the patient's daughter reports that she fell because her legs gave way to the bathroom. The daughter did not think she lost consciousness. The daughter reports that she's been more confused over the past 2 days.    (Consider location/radiation/quality/duration/timing/severity/associated sxs/prior Treatment) HPI  Past Medical History  Diagnosis Date  . Anxiety   . High cholesterol   . Depression    Past Surgical History  Procedure Laterality Date  . Denies surgical history     Family History  Problem Relation Age of Onset  . Dementia Neg Hx   . Parkinsonism Neg Hx    History  Substance Use Topics  . Smoking status: Former Smoker    Quit date: 03/04/1996  . Smokeless tobacco: Not on file  . Alcohol Use: No   OB History    No data available     Review of Systems  Constitutional: Negative for fever and chills.  HENT: Negative for congestion, ear pain, facial  swelling and sore throat.   Eyes: Negative for visual disturbance.  Respiratory: Negative for cough and shortness of breath.   Cardiovascular: Negative for chest pain and palpitations.  Gastrointestinal: Negative for nausea, vomiting, abdominal pain and diarrhea.  Genitourinary: Negative for dysuria, frequency, hematuria and difficulty urinating.  Musculoskeletal: Negative for back pain and neck pain.  Skin: Negative for rash.  Neurological: Positive for headaches. Negative for dizziness, weakness, light-headedness and numbness.      Allergies  Review of patient's allergies indicates no known allergies.  Home Medications   Prior to Admission medications   Medication Sig Start Date End Date Taking? Authorizing Provider  busPIRone (BUSPAR) 15 MG tablet Take 15 mg by mouth 2 (two) times daily.    Historical Provider, MD  cephALEXin (KEFLEX) 500 MG capsule Take 1 capsule (500 mg total) by mouth 4 (four) times daily. Patient not taking: Reported on 07/14/2014 05/15/14   Lorre Nick, MD  mirtazapine (REMERON) 15 MG tablet Take 15 mg by mouth at bedtime.    Historical Provider, MD  mirtazapine (REMERON) 30 MG tablet Take 1 tablet (30 mg total) by mouth at bedtime. Patient not taking: Reported on 07/14/2014 03/24/14   Everlene Farrier, PA-C   BP 123/50 mmHg  Pulse 85  Temp(Src) 97.5 F (36.4 C) (Oral)  Resp 16  SpO2 100% Physical Exam  Constitutional: She appears well-developed and well-nourished. No distress.  Nontoxic appearing.  HENT:  Head: Normocephalic and atraumatic.  Right Ear: External ear normal.  Left Ear: External ear normal.  Mouth/Throat: Oropharynx is clear and moist. No oropharyngeal exudate.  No obvious signs of head injury or trauma.  Eyes: Conjunctivae and EOM are normal. Pupils are equal, round, and reactive to light. Right eye exhibits no discharge. Left eye exhibits no discharge.  Neck: Normal range of motion. Neck supple. No JVD present. No tracheal deviation  present.  No midline neck tenderness.  Cardiovascular: Normal rate, regular rhythm and intact distal pulses.  Exam reveals no gallop and no friction rub.   Pulmonary/Chest: Effort normal and breath sounds normal. No respiratory distress. She has no wheezes. She has no rales. She exhibits no tenderness.  Lungs are clear to auscultation bilaterally. No chest tenderness.  Abdominal: Soft. Bowel sounds are normal. She exhibits no distension. There is no tenderness. There is no rebound and no guarding.  Abdomen is soft and nontender to palpation.  Musculoskeletal: She exhibits edema.  Patient has bilateral lower extremity edema. No erythema.  Lymphadenopathy:    She has no cervical adenopathy.  Neurological: She is alert. Coordination normal.  Patient is only oriented to self and place and is confused about the month, year and events leading up to today. Neurological exam is somewhat limited as the patient has difficulty following my commands and has slowed responses.  Skin: Skin is warm and dry. No rash noted. She is not diaphoretic. No erythema. No pallor.  Psychiatric: Her behavior is normal. She exhibits a depressed mood. She expresses no homicidal and no suicidal ideation.  Since endorses depression and denies suicidal or homicidal ideations. She makes poor eye contact.  Nursing note and vitals reviewed.   ED Course  Procedures (including critical care time) Labs Review Labs Reviewed  BASIC METABOLIC PANEL - Abnormal; Notable for the following:    Glucose, Bld 100 (*)    GFR calc non Af Amer 56 (*)    All other components within normal limits  CBC  URINALYSIS, ROUTINE W REFLEX MICROSCOPIC (NOT AT [facility])    Imaging Review No results found.   EKG Interpretation None      Filed Vitals:   08/01/14 1113  BP: 123/50  Pulse: 85  Temp: 97.5 F (36.4 C)  TempSrc: Oral  Resp: 16  SpO2: 100%     MDM   Final diagnoses:  Lower extremity weakness  #1: UTI #2: Altered mental  status.     This is a 80 y.o. female who presents to the emergency room after she fell while walking to the bathroom by herself. Patient reports she lives at home with her daughter who helps her usually. Patient is unsure how she fell and whether or not she tripped or not. She reports landing on her right forehead and side. She is complaining of 8 out of 10 pain to her right forehead. I called and spoke to patient's daughter reports that [facility] legs gave way while walking to the bathroom. The patient's daughter also reports she's been more confused over the past 2 days. On exam the patient is afebrile and nontoxic appearing. She makes poor eye contact and appears depressed. She is alert and oriented only to person and place. Patient's abdomen is soft and nontender to palpation. Head appears atraumatic. No sign of injury from fall. Neck and back are nontender to palpation. CBC is within normal limits. BMP is unremarkable. Urinalysis shows nitrite positive urine with moderate leukocytes. Urine culture sent will treat the patient  with Rocephin in the ED. CT head without contrast is unremarkable. To the patient's altered mental status and UTI will consult for admission. The patient was accepted for admission by Dr. Randol Kern. The patient's daughter is in agreement with admission.   This patient was discussed with Dr. Juleen China who agrees with assessment and plan.    Everlene Farrier, PA-C 08/01/14 1636  Raeford Razor, MD 08/02/14 [phone]

## 2014-08-01 NOTE — H&P (Addendum)
Patient Demographics  Susan Simmons, is a 80 y.o. female  MRN: [id number]   DOB - 1934-05-25  Admit Date - 08/01/2014  Outpatient Primary MD for the patient is [name], MD   With History of -  Past Medical History  Diagnosis Date  . Anxiety   . High cholesterol   . Depression       Past Surgical History  Procedure Laterality Date  . Denies surgical history      in for   Chief Complaint  Patient presents with  . Fall     HPI  Susan Simmons  is a 80 y.o. female, and history of depression,  presents to ED after a fall while she was walking to the bathroom , patient is confused can't provide reliable history history was obtained from daughter over the phone,  daughter reports patient been having progressive generalized weakness over the last 3 weeks , at baseline she ambulates without assistance, occasionally with a cane , over the last week she has been needing more assistance, and where today she had a fall , where she hit her head , CT head showing no acute findings , as well daughter reports over the last week her mother became more confused, more lethargic, with poor appetite , daughter reported to ED last week, with negative workup then , getting her worsening symptoms and full daughter brought her again today , cup was significant for UTI .  Review of Systems   Patient is confused, cannot provide reliable review of systems     Social History History  Substance Use Topics  . Smoking status: Former Smoker    Quit date: 03/04/1996  . Smokeless tobacco: Not on file  . Alcohol Use: No     Family History Family History  Problem Relation Age of Onset  . Dementia Neg Hx   . Parkinsonism Neg Hx      Prior to Admission medications   Medication Sig Start Date End Date Taking? Authorizing Provider  busPIRone (BUSPAR) 15 MG tablet Take 15 mg by mouth 2 (two) times daily.    Historical Provider, MD  cephALEXin (KEFLEX) 500 MG capsule Take 1 capsule  (500 mg total) by mouth 4 (four) times daily. Patient not taking: Reported on 07/14/2014 05/15/14   Lorre Nick, MD  mirtazapine (REMERON) 15 MG tablet Take 15 mg by mouth at bedtime.    Historical Provider, MD  mirtazapine (REMERON) 30 MG tablet Take 1 tablet (30 mg total) by mouth at bedtime. Patient not taking: Reported on 07/14/2014 03/24/14   Everlene Farrier, PA-C    No Known Allergies  Physical Exam  Vitals  Blood pressure 130/57, pulse 70, temperature 97.5 F (36.4 C), temperature source Oral, resp. rate 16, SpO2 95 %.   1. General General elderly female laying in bed in NAD,    2. confused , flat affect  Awake , Oriented X 1.  3. No F.N deficits, ALL C.Nerves Intact, Plantars down going, patient has generalized weakness . Motor upper extremity 4 out of 5, lower extremity 2-3 out of 5.  4. Ears and Eyes appear Normal, Conjunctivae clear, PERRLA. Moist Oral Mucosa.  5. Supple Neck, No JVD, No cervical lymphadenopathy appriciated, No Carotid Bruits.  6. Symmetrical Chest wall movement, Good air movement bilaterally .  7. RRR, No Gallops, Rubs or Murmurs, No Parasternal Heave.  8. Positive Bowel Sounds, Abdomen Soft, No tenderness, No organomegaly appriciated,No rebound -guarding or rigidity.  9.  No Cyanosis, Normal  Skin Turgor, No Skin Rash or Bruise, trace pedal edema .  10. Good muscle tone,  joints appear normal , no effusions, Normal ROM.  11. No Palpable Lymph Nodes in Neck or Axillae    Data Review  CBC  Recent Labs Lab 07/26/14 1141 07/26/14 1147 08/01/14 1133  WBC 4.2  --  4.2  HGB 13.6 15.0 12.2  HCT 42.2 44.0 38.0  PLT 235  --  281  MCV 89.4  --  88.4  MCH 28.8  --  28.4  MCHC 32.2  --  32.1  RDW 12.3  --  12.1  LYMPHSABS 1.7  --   --   MONOABS 0.6  --   --   EOSABS 0.1  --   --   BASOSABS 0.0  --   --    ------------------------------------------------------------------------------------------------------------------  Chemistries   Recent  Labs Lab 07/26/14 1141 07/26/14 1147 08/01/14 1133  NA 140 138 139  K 4.4 4.3 4.0  CL 105 103 105  CO2 28  --  27  GLUCOSE 102* 101* 100*  BUN 21* 21* 17  CREATININE 1.02* 1.10* 0.95  CALCIUM 9.0  --  8.9  AST 44*  --   --   ALT 22  --   --   ALKPHOS 56  --   --   BILITOT 0.7  --   --    ------------------------------------------------------------------------------------------------------------------ CrCl cannot be calculated (Unknown ideal weight.). ------------------------------------------------------------------------------------------------------------------ No results for input(s): TSH, T4TOTAL, T3FREE, THYROIDAB in the last 72 hours.  Invalid input(s): FREET3   Coagulation profile No results for input(s): INR, PROTIME in the last 168 hours. ------------------------------------------------------------------------------------------------------------------- No results for input(s): DDIMER in the last 72 hours. -------------------------------------------------------------------------------------------------------------------  Cardiac Enzymes No results for input(s): CKMB, TROPONINI, MYOGLOBIN in the last 168 hours.  Invalid input(s): CK ------------------------------------------------------------------------------------------------------------------ Invalid input(s): POCBNP   ---------------------------------------------------------------------------------------------------------------  Urinalysis    Component Value Date/Time   COLORURINE YELLOW 08/01/2014 1055   APPEARANCEUR TURBID* 08/01/2014 1055   LABSPEC 1.022 08/01/2014 1055   PHURINE 6.0 08/01/2014 1055   GLUCOSEU NEGATIVE 08/01/2014 1055   HGBUR SMALL* 08/01/2014 1055   BILIRUBINUR NEGATIVE 08/01/2014 1055   KETONESUR 15* 08/01/2014 1055   PROTEINUR NEGATIVE 08/01/2014 1055   UROBILINOGEN 1.0 08/01/2014 1055   NITRITE POSITIVE* 08/01/2014 1055   LEUKOCYTESUR MODERATE* 08/01/2014 1055     ----------------------------------------------------------------------------------------------------------------  Imaging results:   Ct Head Wo Contrast  08/01/2014   CLINICAL DATA:  Right-sided head pain secondary to a fall in the bathroom this morning.  EXAM: CT HEAD WITHOUT CONTRAST  TECHNIQUE: Contiguous axial images were obtained from the base of the skull through the vertex without intravenous contrast.  COMPARISON:  CT scan dated 07/26/2014  FINDINGS: No mass lesion. No midline shift. No acute hemorrhage or hematoma. No extra-axial fluid collections. No evidence of acute infarction. Slight atrophy of the temporal lobes. Osseous structures are normal.  IMPRESSION: No acute abnormality.  Slight temporal lobe atrophy.   Electronically Signed   By: Francene Boyers M.D.   On: 08/01/2014 12:53        Assessment & Plan  Principal Problem:   UTI (lower urinary tract infection) Active Problems:   Depression   Altered mental state   Weakness  altered mental status, generalized weakness - This is most likely related to infectious process secondary to UTI, will continue with UTI treatment, will consult PT, continue gentle hydration. - CT head with no acute finding. - Patient has more pronounced lower extremity weakness more than  the upper, discussed with neurology who will see the patient in a.m. - Will check MRI of lumbar spine.check TSH, B-12 level and folic acid .   UTI  - Continue with Rocephin, follow on urine cultures .  Depression  - Continue with home medication    DVT Prophylaxis Heparin -   AM Labs Ordered, also please review Full Orders  Family Communication: Admission, patients condition and plan of care including tests being ordered have been discussed with the patient and daughter who indicate understanding and agree with the plan and Code Status.  Code Status Full  Likely DC to  pending further workup   Condition GUARDED    Time spent in minutes : 55  minutes    Sherrica Niehaus M.D on 08/01/2014 at 2:35 PM  Between 7am to 7pm - Pager - [phone]  After 7pm go to [email or web address] - password [email or web address]  And look for the night coverage person covering me after hours  [facility] Office  [phone]

## 2014-08-01 NOTE — ED Notes (Addendum)
Pt family member was trying to help pt to the bathroom 1 hour ago and pt fell forward to right side. Pt c/o of right sided head pain and general right sided pain. Pain 8/10. Pt was seen on 6/8 for fall. Pt has had multiple falls recently. Home health told family that pt needs to be admitted and they are working on getting pt into a rehab facility.

## 2014-08-01 NOTE — ED Notes (Signed)
Patient transported to CT 

## 2014-08-02 DIAGNOSIS — N390 Urinary tract infection, site not specified: Principal | ICD-10-CM

## 2014-08-02 DIAGNOSIS — W19XXXA Unspecified fall, initial encounter: Secondary | ICD-10-CM

## 2014-08-02 DIAGNOSIS — R531 Weakness: Secondary | ICD-10-CM

## 2014-08-02 DIAGNOSIS — G9340 Encephalopathy, unspecified: Secondary | ICD-10-CM | POA: Diagnosis present

## 2014-08-02 DIAGNOSIS — R4182 Altered mental status, unspecified: Secondary | ICD-10-CM

## 2014-08-02 LAB — CBC
HCT: 34.6 % — ABNORMAL LOW (ref 36.0–46.0)
HEMOGLOBIN: 11.2 g/dL — AB (ref 12.0–15.0)
MCH: 29.2 pg (ref 26.0–34.0)
MCHC: 32.4 g/dL (ref 30.0–36.0)
MCV: 90.1 fL (ref 78.0–100.0)
Platelets: 273 10*3/uL (ref 150–400)
RBC: 3.84 MIL/uL — AB (ref 3.87–5.11)
RDW: 12.3 % (ref 11.5–15.5)
WBC: 4.4 10*3/uL (ref 4.0–10.5)

## 2014-08-02 LAB — BASIC METABOLIC PANEL
Anion gap: 8 (ref 5–15)
BUN: 17 mg/dL (ref 6–20)
CALCIUM: 8.5 mg/dL — AB (ref 8.9–10.3)
CO2: 25 mmol/L (ref 22–32)
CREATININE: 0.91 mg/dL (ref 0.44–1.00)
Chloride: 106 mmol/L (ref 101–111)
GFR calc Af Amer: >60 mL/min (ref >60)
GFR, EST NON AFRICAN AMERICAN: 59 mL/min — AB (ref >60)
Glucose, Bld: 87 mg/dL (ref 65–99)
Potassium: 3.9 mmol/L (ref 3.5–5.1)
SODIUM: 139 mmol/L (ref 135–145)

## 2014-08-02 LAB — FOLATE: Folate: 17.2 ng/mL (ref >5.9)

## 2014-08-02 LAB — VITAMIN B12: VITAMIN B 12: 453 pg/mL (ref 180–914)

## 2014-08-02 LAB — TSH: TSH: 0.871 u[IU]/mL (ref 0.350–4.500)

## 2014-08-02 MED ORDER — CARBIDOPA-LEVODOPA 25-100 MG PO TABS
1.0000 | ORAL_TABLET | Freq: Three times a day (TID) | ORAL | Status: DC
  Administered 2014-08-02 (×3): 1 via ORAL
  Filled 2014-08-02 (×6): qty 1

## 2014-08-02 NOTE — Consult Note (Signed)
Neurology Consultation Reason for Consult: LE weakness Referring Physician: Elgergawy, D  CC: Weakness  History is obtained from:Patient,  HPI: Susan GasserShirley Simmons is a 80 y.o. female who presentes with worsening gait difficulty and confusion. She has been seen three times in the past few weeks for difficulty with her gait. She apparently has waxing and waning of her gait difficulty and saw Dr. Lucia GaskinsAhern as an outpatient. She was doing quite well when she saw Dr. Lucia GaskinsAhern, and therefore she was scheduled with follow up but no medication trial.   Two years ago, she had a UTI and at that time had significant confusion as well. She was bed bound and unable to walk during that time period and needed significant rehabilitation.   The patient volunteers that her memory has not been good at times,but the daughter states that "confusion" rather than memory seems to be her main issue.   ROS: A 14 point ROS was performed and is negative except as noted in the HPI.   Past Medical History  Diagnosis Date  . Anxiety   . High cholesterol   . Depression     Family History: No parkinsons  Social History: Tob: former smoker  Exam: Current vital signs: BP 123/59 mmHg  Pulse 67  Temp(Src) 97.5 F (36.4 C) (Oral)  Resp 16  Ht 5\' 5"  (1.651 m)  Wt 55.067 kg (121 lb 6.4 oz)  BMI 20.20 kg/m2  SpO2 97% Vital signs in last 24 hours: Temp:  [97.5 F (36.4 C)-98.2 F (36.8 C)] 97.5 F (36.4 C) (06/27 0554) Pulse Rate:  [67-85] 67 (06/27 0554) Resp:  [16-17] 16 (06/27 0554) BP: (123-144)/(50-71) 123/59 mmHg (06/27 0554) SpO2:  [95 %-100 %] 97 % (06/27 0554) Weight:  [55.067 kg (121 lb 6.4 oz)] 55.067 kg (121 lb 6.4 oz) (06/26 1800)  Physical Exam  Constitutional: Appears well-developed and well-nourished.  Psych: Affect appropriate to situation Eyes: No scleral injection HENT: No OP obstrucion Head: Normocephalic.  Cardiovascular: Normal rate and regular rhythm.  Respiratory: Effort normal and  breath sounds normal to anterior ascultation GI: Soft.  No distension. There is no tenderness.  Skin: WDI  Neuro: Mental Status: Patient is awake, alert, oriented to person, month, guives year as 2015 and place as hospital in "[place]" No signs of aphasia or neglect Cranial Nerves: II: Visual Fields are full. Pupils are equal, round, and reactive to light.   III,IV, VI: EOMI without ptosis or diploplia.  V: Facial sensation is symmetric to temperature VII: Facial movement is symmetric.  VIII: hearing is intact to voice X: Uvula elevates symmetrically XI: Shoulder shrug is symmetric. XII: tongue is midline without atrophy or fasciculations.  Motor: Tone is increased in all four extremities with some cogwheeling. She has 4-/5 strength of the lower extremities bilaterally, but with prompting, she is able to improve her effort some.  Sensory: Sensation is symmetric to light touch  Deep Tendon Reflexes: 2+ and symmetric in the biceps and patellae. Reduced at the ankles.  Cerebellar: No clear ataxia.    I have reviewed labs in epic and the results pertinent to this consultation are: [id number]  I have reviewed the images obtained: CT head - unremarkable  Impression: 80 yo F with generalized weakness and confusion in the setting of a UTI. Given the description of her gait difficulties, I am concerned that parkinsonism may be playing a role. With a history of confusion with UTIs, cognitive decline would certainly be a concern as well.   I  would favor a sinemet trial at this time in addition to treating her UTI. If she does improve, medication withdrawal may be necessary to confirm the diagnosis, but given her degree of impairment currently I would favor going ahead and starting it.   Recommendations: 1) Sinemet 25/100 TID 2) Treat UTI 3) Neurology will continue to follow.    Ritta Slot, MD [facility] [phone]  If 7pm- 7am, please page neurology on call as  listed in AMION.

## 2014-08-02 NOTE — Progress Notes (Signed)
Patient ID: Susan Simmons, female   DOB: [birth year], 80 y.o.   MRN: [id number] [facility] PROGRESS NOTE  Susan Simmons WUJ:[id number] DOB: May 03, 1934 DOA: 08/01/2014 PCP: [name], MD  Brief narrative:    80 year old female with past medical history of depression who presented to [facility] long hospital status post fall while walking in the bathroom. Per patient's family, report given on the admission, patient has had progressive generalized weakness over past couple of weeks prior to this admission. She has required more assistance with ambulation. Confusion became a more recent over past week or so. Vital signs were stable at the time of the admission. Urinalysis revealed moderate leukocytes and many bacteria and patient was started empirically on Rocephin for UTI. Neurology has seen the patient in consultation and was concerned that patient may have Parkinson's like symptoms and she was started on Sinemet.  Assessment/Plan:    Principal Problem: Acute encephalopathy / fall / generalized weakness /possible Parkinson's disease - Patient with cognitive decline and generalized weakness, raises obvious concern for possible Parkinson's disease. - Patient was seen by neurology in consultation and recommendation was to start Sinemet - Obtain physical therapy evaluation   Active Problems: UTI (lower urinary tract infection) - Urinalysis on the admission showed large leukocytes and many bacteria - Continue Rocephin - Follow-up urine culture results  Depression - Continue BuSpar 15 mg twice daily    DVT Prophylaxis  -Heparin sub Q ordered    Code Status: Full.  Family Communication:  Family not at the bedsidethisam Disposition Plan: needs PT evaluation    IV access:  Peripheral IV  Procedures and diagnostic studies:    Ct Head Wo Contrast 08/01/2014   No acute abnormality.  Slight temporal lobe atrophy.   Electronically Signed   By: Francene Boyers M.D.   On: 08/01/2014  12:53   Medical Consultants:  Neurology  Other Consultants:  Physical therapy  IAnti-Infectives:   Rocephin 08/01/2014 -->   Manson Passey, MD  [facility] Pager [phone]  Time spent in minutes: 25 minutes  If 7PM-7AM, please contact night-coverage [email or web address] Password [email or web address] 08/02/2014, 11:11 AM   LOS: 1 day    HPI/Subjective: No acute overnight events. Patient drowsy, sleeping.  Objective: Filed Vitals:   08/01/14 1453 08/01/14 1800 08/01/14 2125 08/02/14 0554  BP: 128/56  144/71 123/59  Pulse: 78  76 67  Temp: 97.6 F (36.4 C)  98.2 F (36.8 C) 97.5 F (36.4 C)  TempSrc: Oral  Oral Oral  Resp: Height:   (1.651 m)    Weight:  55.067 kg (121 lb 6.4 oz)    SpO2: 100%  100% 97%    Intake/Output Summary (Last 24 hours) at 08/02/14 1111 Last data filed at 08/02/14 0700  Gross per 24 hour  Intake 1562.5 ml  Output      0 ml  Net 1562.5 ml    Exam:   General:  Pt is somnolent this am, not in acute distress  Cardiovascular: Regular rate and rhythm, S1/S2(+)  Respiratory: Clear to auscultation bilaterally, no wheezing, no crackles, no rhonchi  Abdomen: Soft, non tender, non distended, bowel sounds present  Extremities: No edema, pulses DP and PT palpable bilaterally  Neuro: Grossly nonfocal  Data Reviewed: Basic Metabolic Panel:  Recent Labs Lab 07/26/14 1141 07/26/14 1147 08/01/14 1133 08/02/14 0441  NA 140 138 139 139  K 4.4 4.3 4.0 3.9  CL 105 103 105 106  CO2 28  --  27  25  GLUCOSE 102* 101* 100* 87  BUN 21* 21* 17 17  CREATININE 1.02* 1.10* 0.95 0.91  CALCIUM 9.0  --  8.9 8.5*   Liver Function Tests:  Recent Labs Lab 07/26/14 1141  AST 44*  ALT 22  ALKPHOS 56  BILITOT 0.7  PROT 6.9  ALBUMIN 3.3*   No results for input(s): LIPASE, AMYLASE in the last 168 hours. No results for input(s): AMMONIA in the last 168 hours. CBC:  Recent Labs Lab 07/26/14 1141 07/26/14 1147 08/01/14 1133 08/02/14 0441   WBC 4.2  --  4.2 4.4  NEUTROABS 1.8  --   --   --   HGB 13.6 15.0 12.2 11.2*  HCT 42.2 44.0 38.0 34.6*  MCV 89.4  --  88.4 90.1  PLT 235  --  281 273   Cardiac Enzymes: No results for input(s): CKTOTAL, CKMB, CKMBINDEX, TROPONINI in the last 168 hours. BNP: Invalid input(s): POCBNP CBG: No results for input(s): GLUCAP in the last 168 hours.  Recent Results (from the past 240 hour(s))  Urine culture     Status: None   Collection Time: 07/26/14  5:14 PM  Result Value Ref Range Status   Specimen Description URINE, CATHETERIZED  Final   Special Requests Normal  Final   Culture   Final    <10,000 COLONIES/mL INSIGNIFICANT GROWTH Performed at [facility]    Report Status 07/28/2014 FINAL  Final     Scheduled Meds: . busPIRone  15 mg Oral BID  . carbidopa-levodopa  1 tablet Oral TID  . cefTRIAXone (ROCEPHIN)  IV  1 g Intravenous Q24H  . heparin  5,000 Units Subcutaneous 3 times per day  . mirtazapine  15 mg Oral QHS   Continuous Infusions: . sodium chloride 75 mL/hr at 08/02/14 [phone]

## 2014-08-02 NOTE — Evaluation (Signed)
Physical Therapy Evaluation Patient Details Name: Susan Simmons MRN: [id number] DOB: 1934-03-01 Today's Date: 08/02/2014   History of Present Illness  80 year old female with past medical history of depression who presented to [facility] long hospital status post fall while walking in the bathroom. Per patient's family, report given on the admission, patient has had progressive generalized weakness over past couple of weeks prior to this admission. She has required more assistance with ambulation. Confusion became a more recent over past week or so.  Clinical Impression  Pt admitted with above diagnosis. Pt currently with functional limitations due to the deficits listed below (see PT Problem List). Pt will benefit from skilled PT to increase their independence and safety with mobility to allow discharge to the venue listed below.  Recommend SNF to work towards improving functional mobility after d/c from acute care.     Follow Up Recommendations SNF    Equipment Recommendations  None recommended by PT    Recommendations for Other Services       Precautions / Restrictions Precautions Precautions: Fall Restrictions Weight Bearing Restrictions: No      Mobility  Bed Mobility Overal bed mobility: +2 for physical assistance;Needs Assistance Bed Mobility: Supine to Sit     Supine to sit: Max assist;+2 for physical assistance     General bed mobility comments: Pt with little initation of any movement of UE or LE during movement.  Transfers Overall transfer level: Needs assistance Equipment used: 2 person hand held assist Transfers: Sit to/from [employer] Sit to Stand: +2 physical assistance;Min assist Stand pivot transfers: +2 physical assistance;Min assist       General transfer comment: Pt stood and performed SPT with HHA of 2 with short shuffeled steps to recliner.  Pt lethargic.  Ambulation/Gait             General Gait Details: Deferred gait  beyond SPT due to lethargy  Stairs            Wheelchair Mobility    Modified Rankin (Stroke Patients Only)       Balance Overall balance assessment: Needs assistance Sitting-balance support: No upper extremity supported;Feet supported Sitting balance-Leahy Scale: Fair     Standing balance support: Bilateral upper extremity supported Standing balance-Leahy Scale: Poor                               Pertinent Vitals/Pain Pain Assessment: Faces Faces Pain Scale: No hurt    Home Living                        Prior Function Level of Independence: Needs assistance   Gait / Transfers Assistance Needed: Recent fall with ambulation and decline in ambulation over the last few weeks.           Hand Dominance        Extremity/Trunk Assessment   Upper Extremity Assessment: Generalized weakness           Lower Extremity Assessment: Generalized weakness      Cervical / Trunk Assessment: Normal  Communication   Communication: No difficulties  Cognition Arousal/Alertness: Lethargic Behavior During Therapy: Flat affect Overall Cognitive Status: No family/caregiver present to determine baseline cognitive functioning                      General Comments General comments (skin integrity, edema, etc.): Pt sleepy during session and needing  cues to keep eyes open,    Exercises Total Joint Exercises Ankle Circles/Pumps: AROM;Both;5 reps Heel Slides: AAROM;Both;5 reps      Assessment/Plan    PT Assessment Patient needs continued PT services  PT Diagnosis Difficulty walking;Generalized weakness   PT Problem List Decreased strength;Decreased activity tolerance;Decreased balance;Decreased mobility;Decreased safety awareness  PT Treatment Interventions Gait training;DME instruction;Functional mobility training;Therapeutic activities;Therapeutic exercise;Balance training   PT Goals (Current goals can be found in the Care Plan  section) Acute Rehab PT Goals Patient Stated Goal: None stated by pt. PT Goal Formulation: Patient unable to participate in goal setting Time For Goal Achievement: 08/16/14 Potential to Achieve Goals: Good    Frequency Min 3X/week   Barriers to discharge        Co-evaluation               End of Session   Activity Tolerance: Patient limited by lethargy;Patient limited by fatigue Patient left: in chair;with call bell/phone within reach;with chair alarm set Nurse Communication: Mobility status         Time: 0932-6712 PT Time Calculation (min) (ACUTE ONLY): 13 min   Charges:   PT Evaluation $Initial PT Evaluation Tier I: 1 Procedure     PT G Codes:        Susan Simmons 08/02/2014, 12:42 PM

## 2014-08-02 NOTE — Clinical Social Work Note (Signed)
Clinical Social Work Assessment  Patient Details  Name: Susan GasserShirley Simmons MRN: [id number] Date of Birth: [birth year]  Date of referral:  08/02/14               Reason for consult:  Facility Placement                Permission sought to share information with:  Family Supports Permission granted to share information::  Yes, Verbal Permission Granted  Name::     [name]  Agency::     Relationship::  Sister  Contact Information:     Housing/Transportation Living arrangements for the past 2 months:  Single Family Home Source of Information:  Adult Children Patient Interpreter Needed:  None Criminal Activity/Legal Involvement Pertinent to Current Situation/Hospitalization:  No - Comment as needed Significant Relationships:  Adult Children Lives with:  Adult Children Do you feel safe going back to the place where you live?  No Need for family participation in patient care:  Yes (Comment)  Care giving concerns:  Daughter reports that patient has become weaker and feels she needs rehabilitation again prior to returning home. Dtr offers assistance but patient is home alone at times.   Social Worker assessment / plan:  CSW received referral in order to assist with DC planning. CSW reviewed chart and attempted to meet with patient but patient unable to participate in assessment. CSW called dtr [name]([name]) and completed assessment with her.  Patient lives at home with dtr but has been weak and rehab. Patient went to rehab about 2-3 years ago out of town and dtr is familiar with process. CSW left SNF list in room and explained process. Dtr understanding of plans and will research facilities and will have options for DC.   CSW completed FL2 and faxed out. CSW will follow up with bed offers.  Employment status:  Disabled (Comment on whether or not currently receiving Disability) Insurance information:  Managed Medicare PT Recommendations:  Skilled Nursing Facility Information / Referral to community  resources:  Skilled Nursing Facility  Patient/Family's Response to care:  Patient unable to participate. Dtr is anxious about DC plans and does not have experience with any local facilities.   Patient/Family's Understanding of and Emotional Response to Diagnosis, Current Treatment, and Prognosis:  Dtr reports she knows that patient is getting older but that she has been fairly independent. Dtr feels that once patient gets stronger that she and patient will do well living together.   Emotional Assessment Appearance:  Appears older than stated age Attitude/Demeanor/Rapport:  Lethargic Affect (typically observed):  Unable to Assess Orientation:  Fluctuating Orientation (Suspected and/or reported Sundowners) Alcohol / Substance use:  Not Applicable Psych involvement (Current and /or in the community):  No (Comment)  Discharge Needs  Concerns to be addressed:  No discharge needs identified Readmission within the last 30 days:  No Current discharge risk:  None Barriers to Discharge:  Continued Medical Work up   Levi Strausserber, Scottlynn Lindell Marie, LCSW 08/02/2014, 1:45 PM  [phone]

## 2014-08-02 NOTE — Clinical Social Work Placement (Signed)
   CLINICAL SOCIAL WORK PLACEMENT  NOTE  Date:  08/02/2014  Patient Details  Name: [name] MRN: [id number] Date of Birth: 1934/10/10  Clinical Social Work is seeking post-discharge placement for this patient at the Skilled  Nursing Facility level of care (*CSW will initial, date and re-position this form in  chart as items are completed):  Yes   Patient/family provided with [place] Clinical Social Work Department's list of facilities offering this level of care within the geographic area requested by the patient (or if unable, by the patient's family).  Yes   Patient/family informed of their freedom to choose among providers that offer the needed level of care, that participate in Medicare, Medicaid or managed care program needed by the patient, have an available bed and are willing to accept the patient.  Yes   Patient/family informed of [place]'s ownership interest in [facility] and [facility], as well as of the fact that they are under no obligation to receive care at these facilities.  PASRR submitted to EDS on 08/02/14     PASRR number received on 08/02/14     Existing PASRR number confirmed on       FL2 transmitted to all facilities in geographic area requested by pt/family on       FL2 transmitted to all facilities within larger geographic area on       Patient informed that his/her managed care company has contracts with or will negotiate with certain facilities, including the following:            Patient/family informed of bed offers received.  Patient chooses bed at       Physician recommends and patient chooses bed at      Patient to be transferred to   on  .  Patient to be transferred to facility by       Patient family notified on   of transfer.  Name of family member notified:        PHYSICIAN       Additional Comment:    _______________________________________________ Marnee SpringGerber, Shawniece Oyola Marie, LCSW 08/02/2014, 1:56 PM

## 2014-08-03 DIAGNOSIS — F329 Major depressive disorder, single episode, unspecified: Secondary | ICD-10-CM

## 2014-08-03 DIAGNOSIS — B9620 Unspecified Escherichia coli [E. coli] as the cause of diseases classified elsewhere: Secondary | ICD-10-CM

## 2014-08-03 LAB — URINE CULTURE: Culture: >=100000

## 2014-08-03 MED ORDER — LORAZEPAM 2 MG/ML IJ SOLN
INTRAMUSCULAR | Status: AC
  Filled 2014-08-03: qty 1

## 2014-08-03 MED ORDER — LORAZEPAM 2 MG/ML IJ SOLN
1.0000 mg | Freq: Four times a day (QID) | INTRAMUSCULAR | Status: DC | PRN
  Administered 2014-08-03 – 2014-08-04 (×2): 1 mg via INTRAVENOUS
  Filled 2014-08-03: qty 1

## 2014-08-03 MED ORDER — CEPHALEXIN 500 MG PO CAPS
500.0000 mg | ORAL_CAPSULE | Freq: Two times a day (BID) | ORAL | Status: DC
  Administered 2014-08-03 – 2014-08-05 (×4): 500 mg via ORAL
  Filled 2014-08-03 (×6): qty 1

## 2014-08-03 NOTE — Progress Notes (Addendum)
Subjective: Patient remains delirious.   She is very worried about her "babies" stating that they have not had anything to eat. She is not very redirectable but is cooperative.   Exam: Filed Vitals:   08/03/14 0545  BP: 133/56  Pulse: 71  Temp: 98.4 F (36.9 C)  Resp: 16   Gen: In bed, NAD MS: Awake, alert, thinks that she is in [place],. WJ:XBJYNCN:PERRL, EOMI Motor: improved tone, able to lift legs off bed.  Sensory:intact to LT  Impression: 80 yo F with probable parkinsonism and delirium. Her delirium actually seems slightly worse to me today, and sinemet certainly could be responsible for this. It does appear to have loosened her up compared to yesterday, but I think that with her degree of confusion, we may need to put a hold on a trial until her mental status improves.   Recommendations: 1) Discontinue sinemet 2) continue to tx UTI.  3) MRI pending  Ritta SlotMcNeill Kenesha Moshier, MD [facility] [phone]  If 7pm- 7am, please page neurology on call as listed in AMION.

## 2014-08-03 NOTE — Progress Notes (Addendum)
Patient ID: Susan Simmons, female   DOB: 12-14-1934, 80 y.o.   MRN: [id number] [facility] PROGRESS NOTE  Susan GasserShirley Simmons [id number]:[id number] DOB: 12-14-1934 DOA: 08/01/2014 PCP: Beverley FiedlerANKINS,VICTORIA, MD  Brief narrative:    80 year old female with past medical history of depression who [facility] post fall while walking in the bathroom. Per patient's family, report given on the admission, patient has had progressive generalized weakness over past couple of weeks prior to this admission. She has required more assistance with ambulation. Confusion became a more recent over past week or so.  Vital signs were stable at the time of the admission. Urinalysis revealed moderate leukocytes and many bacteria and patient was started empirically on Rocephin for UTI. Neurology has seen the patient in consultation and was concerned that patient may have Parkinson's like symptoms and she was started on Sinemet. Neurology then decided it would be better to hold Sinemet until mentals status improves so we stopped it 6/28.  Barrier to discharge: Continue abx for E.Coli UTI. Will switch from rocephin to keflex based on sensitivity report. To SNF likely in next 24 hours, 08/04/2014.  Assessment/Plan:    Principal Problem: Acute encephalopathy / fall / generalized weakness /possible Parkinson's disease - Patient with cognitive decline and generalized weakness, raises obvious concern for possible Parkinson's disease. - Per neurology, they initially started SInemet but now recommend holding it until mental status better - PT evaluation done and recommendation for SNF placement.    Active Problems: UTI (lower urinary tract infection) secondary to E.Coli  - Urinalysis on the admission showed large leukocytes and many bacteria. Urine culture positive for E.Coli, pan-sensitive - Initially on Rocephin but will change to Keflex today in anticipation that she will go to SNF tomorrow.    Depression - Continue BuSpar 15 mg twice daily    DVT Prophylaxis  -Heparin sub Q ordered while pt in hospital    Code Status: Full.  Family Communication:  Family not at the bedside Disposition Plan: likely to SNF in next 24-48 hours.   IV access:  Peripheral IV  Procedures and diagnostic studies:    Ct Head Wo Contrast 08/01/2014   No acute abnormality.  Slight temporal lobe atrophy.     Medical Consultants:  Neurology, Dr. Onalee HuaKirkpatrick Mcneil  Other Consultants:  Physical therapy - SNF  IAnti-Infectives:   Rocephin 08/01/2014 --> 08/03/2014 Keflex 08/03/2014 -->   Manson PasseyEVINE, Susan Seminara, MD  [facility] Pager [phone]  Time spent in minutes: 15 minutes  If 7PM-7AM, please contact night-coverage [email or web address] Password [email or web address] 08/03/2014, 11:47 AM   LOS: 2 days    HPI/Subjective: No acute overnight events. Patient reports no abdominal pain, nausea or vomiting.   Objective: Filed Vitals:   08/02/14 0554 08/02/14 1500 08/02/14 2128 08/03/14 0545  BP: 123/59 106/36 128/49 133/56  Pulse: 67 66 72 71  Temp: 97.5 F (36.4 C) 98.6 F (37 C) 98.1 F (36.7 C) 98.4 F (36.9 C)  TempSrc: Oral Oral Oral Oral  Resp: 16 15 16 16   Height:      Weight:      SpO2: 97% 99% 99% 97%    Intake/Output Summary (Last 24 hours) at 08/03/14 1147 Last data filed at 08/03/14 [id number]  Gross per 24 hour  Intake    360 ml  Output      0 ml  Net    360 ml    Exam:   General:  Pt is more alert this am but confused  Cardiovascular:  RRR, S1/S2(+)  Respiratory: No wheezing, no crackles, no rhonchi  Abdomen: non tender abdomen, non distended, (+) BS  Extremities: No swelling, pulses palpable   Neuro: Nonfocal  Data Reviewed: Basic Metabolic Panel:  Recent Labs Lab 08/01/14 1133 08/02/14 0441  NA 139 139  K 4.0 3.9  CL 105 106  CO2 27 25  GLUCOSE 100* 87  BUN 17 17  CREATININE 0.95 0.91  CALCIUM 8.9 8.5*   Liver Function Tests: No results for input(s): AST,  ALT, ALKPHOS, BILITOT, PROT, ALBUMIN in the last 168 hours. No results for input(s): LIPASE, AMYLASE in the last 168 hours. No results for input(s): AMMONIA in the last 168 hours. CBC:  Recent Labs Lab 08/01/14 1133 08/02/14 0441  WBC 4.2 4.4  HGB 12.2 11.2*  HCT 38.0 34.6*  MCV 88.4 90.1  PLT 281 273   Cardiac Enzymes: No results for input(s): CKTOTAL, CKMB, CKMBINDEX, TROPONINI in the last 168 hours. BNP: Invalid input(s): POCBNP CBG: No results for input(s): GLUCAP in the last 168 hours.  Recent Results (from the past 240 hour(s))  Urine culture     Status: None   Collection Time: 07/26/14  5:14 PM  Result Value Ref Range Status   Specimen Description URINE, CATHETERIZED  Final   Special Requests Normal  Final   Culture   Final    <10,000 COLONIES/mL INSIGNIFICANT GROWTH Performed at [facility]    Report Status 07/28/2014 FINAL  Final  Urine culture     Status: None   Collection Time: 08/01/14 10:55 AM  Result Value Ref Range Status   Specimen Description URINE, CLEAN CATCH  Final   Special Requests NONE  Final   Culture   Final    >=100,000 COLONIES/mL ESCHERICHIA COLI Performed at [facility]    Report Status 08/03/2014 FINAL  Final   Organism ID, Bacteria ESCHERICHIA COLI  Final      Susceptibility   Escherichia coli - MIC*    AMPICILLIN 8 SENSITIVE Sensitive     CEFAZOLIN <=4 SENSITIVE Sensitive     CEFTRIAXONE <=1 SENSITIVE Sensitive     CIPROFLOXACIN <=0.25 SENSITIVE Sensitive     GENTAMICIN <=1 SENSITIVE Sensitive     IMIPENEM <=0.25 SENSITIVE Sensitive     NITROFURANTOIN <=16 SENSITIVE Sensitive     TRIMETH/SULFA <=20 SENSITIVE Sensitive     AMPICILLIN/SULBACTAM 4 SENSITIVE Sensitive     PIP/TAZO <=4 SENSITIVE Sensitive     * >=100,000 COLONIES/mL ESCHERICHIA COLI     Scheduled Meds: . busPIRone  15 mg Oral BID  . cefTRIAXone (ROCEPHIN)  IV  1 g Intravenous Q24H  . heparin  5,000 Units Subcutaneous 3 times per day  .  mirtazapine  15 mg Oral QHS   Continuous Infusions: . sodium chloride 75 mL/hr at 08/02/14 2104

## 2014-08-03 NOTE — Care Management (Signed)
Important Message  Patient Details  Name: Sabino GasserShirley Longshore MRN: [id number] Date of Birth: [birth year]   Medicare Important Message Given:       Haskell FlirtJamison, Linetta Regner, LCSW 08/03/2014, 3:36 PM

## 2014-08-03 NOTE — Progress Notes (Signed)
Clinical Social Work  CSW spoke with patient's dtr to give bed offers. Dtr has toured facilities and reports [facility]. CSW informed SNF of patient and family's choice. CSW will continue to follow and assist with DC plans once stable.  KendletonHolly Heran [place], [place]CS[place] [phone]

## 2014-08-04 ENCOUNTER — Inpatient Hospital Stay (HOSPITAL_COMMUNITY): Payer: Medicare Other

## 2014-08-04 MED ORDER — MIRTAZAPINE 15 MG PO TABS: 15.0000 mg | ORAL_TABLET | Freq: Every day | ORAL | Status: DC

## 2014-08-04 MED ORDER — ACETAMINOPHEN 325 MG PO TABS
650.0000 mg | ORAL_TABLET | Freq: Four times a day (QID) | ORAL | Status: DC | PRN
Start: 2014-08-04 — End: 2015-01-25

## 2014-08-04 MED ORDER — CEPHALEXIN 500 MG PO CAPS: 500.0000 mg | ORAL_CAPSULE | Freq: Two times a day (BID) | ORAL | Status: DC

## 2014-08-04 MED ORDER — BUSPIRONE HCL 15 MG PO TABS: 15.0000 mg | ORAL_TABLET | Freq: Two times a day (BID) | ORAL | Status: DC

## 2014-08-04 NOTE — Discharge Summary (Signed)
Physician Discharge Summary  Susan Simmons OZH:[id number] DOB: [birth year] DOA: 08/01/2014  PCP: [name], MD  Admit date: 08/01/2014 Discharge date: 08/04/2014  Recommendations for Outpatient Follow-up:  1. Continue Keflex for 4 more days on discharge for UTI  Discharge Diagnoses:  Principal Problem:   Acute encephalopathy Active Problems:   UTI (lower urinary tract infection)   Fall   Generalized weakness   Depression   E-coli UTI    Discharge Condition: stable   Diet recommendation: as tolerated   History of present illness:  80 year old female with past medical history of depression who presented to [facility] long hospital status post fall while walking in the bathroom. Per patient's family, report given on the admission, patient has had progressive generalized weakness over past couple of weeks prior to this admission. She has required more assistance with ambulation. Confusion became a more recent over past week or so.  Vital signs were stable at the time of the admission. Urinalysis revealed moderate leukocytes and many bacteria and patient was started empirically on Rocephin for UTI. Neurology has seen the patient in consultation and was concerned that patient may have Parkinson's like symptoms and she was started on Sinemet. Neurology then decided it would be better to hold Sinemet until mentals status improves so we stopped it 6/28.  Hospital Course:   Assessment/Plan:    Principal Problem: Acute encephalopathy / fall / generalized weakness /possible Parkinson's disease - Initial concern per neurology for possible Parkinson's disease for which reason pt started on Sinemet. Neurology then decided will hold off on sinemet until mental status better. Sinemet topped 08/03/2014. - Per PT evaluation - recommendation for SNF placement.   Active Problems: UTI (lower urinary tract infection) secondary to E.Coli  - Urinalysis on the admission showed large leukocytes  and many bacteria.  - Urine culture positive for E.Coli, pan-sensitive - Initially on Rocephin but changed to Keflex 08/03/2014 which pt will continue for 4 more days on discharge.  Depression - Continue BuSpar 15 mg twice daily - Stable    DVT Prophylaxis  -Heparin sub Q ordered in hospital    Code Status: Full.  Family Communication: Family not at the bedside   IV access:  Peripheral IV  Procedures and diagnostic studies:   Ct Head Wo Contrast 08/01/2014 No acute abnormality. Slight temporal lobe atrophy.   Medical Consultants:  Neurology, Dr. Onalee Hua  Other Consultants:  Physical therapy - SNF  IAnti-Infectives:   Rocephin 08/01/2014 --> 08/03/2014 Keflex 08/03/2014 --> for 4 days on discharge      Signed:  Manson Passey, MD  [facility] 08/04/2014, 9:43 AM  Pager #: [phone]  Time spent in minutes: more than 30 minutes    Discharge Exam: Filed Vitals:   08/04/14 0630  BP: 134/73  Pulse: 81  Temp: 97.4 F (36.3 C)  Resp: 16   Filed Vitals:   08/03/14 0545 08/03/14 1332 08/03/14 2135 08/04/14 0630  BP: 133/56 140/58 153/74 134/73  Pulse: 71 73 81 81  Temp: 98.4 F (36.9 C) 97.6 F (36.4 C) 98.3 F (36.8 C) 97.4 F (36.3 C)  TempSrc: Oral Oral Oral Oral  Resp: 16 18 16 16   Height:      Weight:      SpO2: 97% 100% 96% 99%    General: Pt is alert, not in acute distress Cardiovascular: Regular rate and rhythm, S1/S2 appreciated  Respiratory: Clear to auscultation bilaterally, no wheezing, no crackles, no rhonchi Abdominal: Soft, non tender, non distended, bowel sounds +,  no guarding Extremities: no edema, no cyanosis, pulses palpable bilaterally DP and PT Neuro: Grossly nonfocal  Discharge Instructions  Discharge Instructions    Call MD for:  difficulty breathing, headache or visual disturbances    Complete by:  As directed      Call MD for:  persistant nausea and vomiting    Complete by:  As  directed      Call MD for:  severe uncontrolled pain    Complete by:  As directed      Diet - low sodium heart healthy    Complete by:  As directed      Discharge instructions    Complete by:  As directed   1. Continue Keflex for 4 more days on discharge for UTI     Increase activity slowly    Complete by:  As directed             Medication List    TAKE these medications        acetaminophen 325 MG tablet  Commonly known as:  TYLENOL  Take 2 tablets (650 mg total) by mouth every 6 (six) hours as needed for mild pain (or Fever >/= 101).     busPIRone 15 MG tablet  Commonly known as:  BUSPAR  Take 1 tablet (15 mg total) by mouth 2 (two) times daily.     cephALEXin 500 MG capsule  Commonly known as:  KEFLEX  Take 1 capsule (500 mg total) by mouth every 12 (twelve) hours.     loratadine 10 MG tablet  Commonly known as:  CLARITIN  Take 10 mg by mouth daily as needed for allergies (allergies).     mirtazapine 15 MG tablet  Commonly known as:  REMERON  Take 1 tablet (15 mg total) by mouth at bedtime.           Follow-up Information    Follow up with [name], MD. Schedule an appointment as soon as possible for a visit in 1 week.   Specialty:  Family Medicine   Why:  Follow up appt after recent hospitalization   Contact information:   [address] [place] [address] [phone]        The results of significant diagnostics from this hospitalization (including imaging, microbiology, ancillary and laboratory) are listed below for reference.    Significant Diagnostic Studies: Dg Chest 2 View  07/26/2014   CLINICAL DATA:  Swollen RIGHT foot since Friday, water blisters on LEFT forearm, decreased appetite, increased lethargy, fatigue, former smoker  EXAM: CHEST  2 VIEW  COMPARISON:  07/14/2014  FINDINGS: Borderline enlargement of cardiac silhouette.  Mediastinal contours and pulmonary vascularity normal.  Mild RIGHT basilar atelectasis.   Lungs otherwise clear.  No pleural effusion or pneumothorax.  Multilevel endplate spur formation thoracic spine.  IMPRESSION: No acute abnormalities.   Electronically Signed   By: Ulyses Southward M.D.   On: 07/26/2014 16:02   Dg Chest 2 View  07/14/2014   CLINICAL DATA:  Back pain.  EXAM: CHEST  2 VIEW  COMPARISON:  Multiple exams, including 05/15/2014  FINDINGS: Mild degenerative AC joint arthropathy and degenerative glenohumeral arthropathy. Thoracic spondylosis. I do not observe a thoracic compression fracture.  Reverse lordotic projection. Heart size within normal limits for technique.  The lungs appear clear.  No pleural effusion.  IMPRESSION: 1. No acute/active findings. 2. Thoracic spondylosis and degenerative arthropathy in both shoulders.   Electronically Signed   By: Annitta Needs.D.  On: 07/14/2014 14:32   Dg Ankle Complete Right  07/26/2014   CLINICAL DATA:  Pain without trauma.  Swelling.  Difficulty walking.  EXAM: RIGHT ANKLE - COMPLETE 3+ VIEW  COMPARISON:  None.  FINDINGS: Diffuse mild soft tissue swelling. Small calcaneal spur. Lateral view suboptimal secondary to positioning. Joint spaces are maintained for age. Base of fifth metatarsal and talar dome intact.  IMPRESSION: Mild soft tissue swelling, without acute osseous finding.   Electronically Signed   By: Jeronimo GreavesKyle  Talbot M.D.   On: 07/26/2014 12:34   Ct Head Wo Contrast  08/01/2014   CLINICAL DATA:  Right-sided head pain secondary to a fall in the bathroom this morning.  EXAM: CT HEAD WITHOUT CONTRAST  TECHNIQUE: Contiguous axial images were obtained from the base of the skull through the vertex without intravenous contrast.  COMPARISON:  CT scan dated 07/26/2014  FINDINGS: No mass lesion. No midline shift. No acute hemorrhage or hematoma. No extra-axial fluid collections. No evidence of acute infarction. Slight atrophy of the temporal lobes. Osseous structures are normal.  IMPRESSION: No acute abnormality.  Slight temporal lobe  atrophy.   Electronically Signed   By: Francene BoyersJames  Maxwell M.D.   On: 08/01/2014 12:53   Ct Head Wo Contrast  07/26/2014   CLINICAL DATA:  Fatigue. Decreased appetite. Lethargy. Onset of symptoms 07/23/2014.  EXAM: CT HEAD WITHOUT CONTRAST  TECHNIQUE: Contiguous axial images were obtained from the base of the skull through the vertex without intravenous contrast.  COMPARISON:  07/14/2014.  FINDINGS: No mass lesion, mass effect, midline shift, hydrocephalus, hemorrhage. No acute territorial cortical ischemia/infarct. Atrophy and chronic ischemic white matter disease is present. The calvarium is intact. Mastoid air cells are clear.  IMPRESSION: Mild atrophy and chronic ischemic white matter disease without acute intracranial abnormality.   Electronically Signed   By: Andreas NewportGeoffrey  Lamke M.D.   On: 07/26/2014 15:49   Ct Head Wo Contrast  07/14/2014   CLINICAL DATA:  Altered mental status. Parkinson's disease. Abnormal neurologic status. Depressive disorder. Initial encounter.  EXAM: CT HEAD WITHOUT CONTRAST  TECHNIQUE: Contiguous axial images were obtained from the base of the skull through the vertex without intravenous contrast.  COMPARISON:  05/15/2014.  FINDINGS: No mass lesion, mass effect, midline shift, hydrocephalus, hemorrhage. No acute territorial cortical ischemia/infarct. Atrophy and chronic ischemic white matter disease is present. Paranasal sinuses are normal.  IMPRESSION: Atrophy and chronic ischemic white matter disease without acute intracranial abnormality.   Electronically Signed   By: Andreas NewportGeoffrey  Lamke M.D.   On: 07/14/2014 14:41   Dg Hip Unilat With Pelvis 2-3 Views Right  07/14/2014   CLINICAL DATA:  RIGHT hip pain.  No trauma.  Initial encounter.  EXAM: RIGHT HIP (WITH PELVIS) 2-3 VIEWS  COMPARISON:  None.  FINDINGS: There is no evidence of hip fracture or dislocation. No joint space narrowing is present. Lumbar degenerative disease is present with bony facet overgrowth noted on this frontal  projection. Whiskering of the iliac crests compatible with diffuse idiopathic skeletal hyperostosis.  IMPRESSION: No acute osseous abnormality.   Electronically Signed   By: Andreas NewportGeoffrey  Lamke M.D.   On: 07/14/2014 15:32    Microbiology: Recent Results (from the past 240 hour(s))  Urine culture     Status: None   Collection Time: 07/26/14  5:14 PM  Result Value Ref Range Status   Specimen Description URINE, CATHETERIZED  Final   Special Requests Normal  Final   Culture   Final    <10,000 COLONIES/mL INSIGNIFICANT GROWTH Performed  at [facility]    Report Status 07/28/2014 FINAL  Final  Urine culture     Status: None   Collection Time: 08/01/14 10:55 AM  Result Value Ref Range Status   Specimen Description URINE, CLEAN CATCH  Final   Special Requests NONE  Final   Culture   Final    >=100,000 COLONIES/mL ESCHERICHIA COLI Performed at [facility]    Report Status 08/03/2014 FINAL  Final   Organism ID, Bacteria ESCHERICHIA COLI  Final      Susceptibility   Escherichia coli - MIC*    AMPICILLIN 8 SENSITIVE Sensitive     CEFAZOLIN <=4 SENSITIVE Sensitive     CEFTRIAXONE <=1 SENSITIVE Sensitive     CIPROFLOXACIN <=0.25 SENSITIVE Sensitive     GENTAMICIN <=1 SENSITIVE Sensitive     IMIPENEM <=0.25 SENSITIVE Sensitive     NITROFURANTOIN <=16 SENSITIVE Sensitive     TRIMETH/SULFA <=20 SENSITIVE Sensitive     AMPICILLIN/SULBACTAM 4 SENSITIVE Sensitive     PIP/TAZO <=4 SENSITIVE Sensitive     * >=100,000 COLONIES/mL ESCHERICHIA COLI     Labs: Basic Metabolic Panel:  Recent Labs Lab 08/01/14 1133 08/02/14 0441  NA 139 139  K 4.0 3.9  CL 105 106  CO2 27 25  GLUCOSE 100* 87  BUN 17 17  CREATININE 0.95 0.91  CALCIUM 8.9 8.5*   Liver Function Tests: No results for input(s): AST, ALT, ALKPHOS, BILITOT, PROT, ALBUMIN in the last 168 hours. No results for input(s): LIPASE, AMYLASE in the last 168 hours. No results for input(s): AMMONIA in the last 168  hours. CBC:  Recent Labs Lab 08/01/14 1133 08/02/14 0441  WBC 4.2 4.4  HGB 12.2 11.2*  HCT 38.0 34.6*  MCV 88.4 90.1  PLT 281 273   Cardiac Enzymes: No results for input(s): CKTOTAL, CKMB, CKMBINDEX, TROPONINI in the last 168 hours. BNP: BNP (last 3 results) No results for input(s): BNP in the last 8760 hours.  ProBNP (last 3 results) No results for input(s): PROBNP in the last 8760 hours.  CBG: No results for input(s): GLUCAP in the last 168 hours.

## 2014-08-04 NOTE — Progress Notes (Signed)
Subjective: Patient remains delirious.   Exam: Filed Vitals:   08/04/14 0630  BP: 134/73  Pulse: 81  Temp: 97.4 F (36.3 C)  Resp: 16   Gen: In bed, NAD MS: Awake, alert, thinks that she is in [place],. CN: PERRL, EOMI Motor: Tone is increased again.  Sensory:intact to LT  Impression: 80 yo F with probable parkinsonism and delirium. She remains delirious, suspect still this is metabolic secondary to UTI, and predisposition as evidenced by previous similar episodes. I would have hoped she would be clearing up at this point, but feel thsi is still the most likely cause.   Given the response to sinemet, I do think parkinson's is playing a role in her gait, and would restart sinemet once her MS clears, but would not favor starting a new psychoactive medication in the setting of acute delirium.   I have a low suspicion for spinal pathology, though if her symptoms remain once started on sinemet, spinal imaging could be done at that time.   Recommendations: 1) Consider sinemet trial once MS improves.  2) continue to tx UTI.   Ritta SlotMcNeill Kirkpatrick, MD [facility] [phone]  If 7pm- 7am, please page neurology on call as listed in AMION.

## 2014-08-04 NOTE — Progress Notes (Signed)
Physical Therapy Treatment Patient Details Name: Susan GasserShirley Simmons MRN: [id number] DOB: 10-08-1934 Today's Date: 08/04/2014    History of Present Illness 80 year old female with past medical history of depression who presented to [facility] hospital status post fall while walking in the bathroom. Per patient's family, report given on the admission, patient has had progressive generalized weakness over past couple of weeks prior to this admission. She has required more assistance with ambulation. Confusion became a more recent over past week or so.    PT Comments    Pt very lethargic, difficulty keeping eyes open and following commands so assisted back to supine.  Follow Up Recommendations  SNF     Equipment Recommendations  None recommended by PT    Recommendations for Other Services       Precautions / Restrictions Precautions Precautions: Fall Restrictions Weight Bearing Restrictions: No    Mobility  Bed Mobility Overal bed mobility: +2 for physical assistance;Needs Assistance Bed Mobility: Supine to Sit;Sit to Supine     Supine to sit: +2 for physical assistance;Total assist Sit to supine: Total assist;+2 for physical assistance   General bed mobility comments: Pt with little initation of any movement of UE or LE during movement.  Pt did occasionally open her eyes, very lethargic so did not progress mobility, pt not following any commands for UE and LE movement when provided with multimodal cues so returned pt to supine  Transfers                    Ambulation/Gait             General Gait Details: Deferred gait beyond SPT due to lethargy   Stairs            Wheelchair Mobility    Modified Rankin (Stroke Patients Only)       Balance Overall balance assessment: Needs assistance Sitting-balance support: Feet supported;No upper extremity supported Sitting balance-Leahy Scale: Zero Sitting balance - Comments: pt not using UE to self support,  required total assist for trunk control                            Cognition Arousal/Alertness: Lethargic   Overall Cognitive Status: No family/caregiver present to determine baseline cognitive functioning                      Exercises      General Comments        Pertinent Vitals/Pain Pain Assessment: Faces Faces Pain Scale: No hurt (no signs of distress)    Home Living                      Prior Function            PT Goals (current goals can now be found in the care plan section) Progress towards PT goals: Not progressing toward goals - comment (lethargy)    Frequency  Min 2X/week    PT Plan Frequency needs to be updated    Co-evaluation             End of Session   Activity Tolerance: Patient limited by fatigue Patient left: in bed;with call bell/phone within reach;with bed alarm set;with nursing/sitter in room     Time: 1052-1101 PT Time Calculation (min) (ACUTE ONLY): 9 min  Charges:  $Therapeutic Activity: 8-22 mins  G Codes:      Adonay Scheier,KATHrine E 18-Aug-2014, 12:45 PM Zenovia Jarred, PT, DPT 2014-08-18 Pager: [phone]

## 2014-08-04 NOTE — Progress Notes (Signed)
Clinical Social Work  Per Charity fundraiserN, Recruitment consultantsafety sitter placed in room on 6/28 for safety. CSW text paged MD re: DC safety sitter so that patient can DC on 6/30. CSW updated [facility] on DC plans.  CSW will continue to follow.  [place]olly Korrine [place], [place] [phone]

## 2014-08-04 NOTE — Discharge Instructions (Signed)

## 2014-08-04 NOTE — Care Management (Signed)
Important Message  Patient Details  Name: Susan Simmons MRN: [id number] Date of Birth: [birth year]   Medicare Important Message Given:  Yes-second notification given    Haskell FlirtJamison, Makel Mcmann, LCSW 08/04/2014, 11:13 AM

## 2014-08-05 NOTE — Clinical Social Work Placement (Signed)
   CLINICAL SOCIAL WORK PLACEMENT  NOTE  Date:  08/05/2014  Patient Details  Name: Susan Simmons MRN: [id number] Date of Birth: 1935/01/26  Clinical Social Work is seeking post-discharge placement for this patient at the Skilled  Nursing Facility level of care (*CSW will initial, date and re-position this form in  chart as items are completed):  Yes   Patient/family provided with [facility] Clinical Social Work Department's list of facilities offering this level of care within the geographic area requested by the patient (or if unable, by the patient's family).  Yes   Patient/family informed of their freedom to choose among providers that offer the needed level of care, that participate in Medicare, Medicaid or managed care program needed by the patient, have an available bed and are willing to accept the patient.  Yes   Patient/family informed of [facility]'s ownership interest in [facility] and [facility], as well as of the fact that they are under no obligation to receive care at these facilities.  PASRR submitted to EDS on 08/02/14     PASRR number received on 08/02/14     Existing PASRR number confirmed on       FL2 transmitted to all facilities in geographic area requested by pt/family on 08/02/14     FL2 transmitted to all facilities within larger geographic area on       Patient informed that his/her managed care company has contracts with or will negotiate with certain facilities, including the following:  [facility]     Yes   Patient/family informed of bed offers received.  [facility]     Physician recommends and patient chooses bed at      [facility] to [facility] on 08/05/14.  Patient to be transferred to facility by PTAR     Patient family notified on 08/05/14 of transfer.  Name of family member notified:  [name]-dtr     PHYSICIAN       Additional Comment:     _______________________________________________ Marnee SpringGerber, Jeniya Flannigan Marie, LCSW 08/05/2014, 12:18 PM

## 2014-08-05 NOTE — Progress Notes (Signed)
Clinical Social Work  Patient medically stable to DC to [facility]. Patient confused and unable to fully participate in assessment. CSW spoke with dtr Susan Simmons([name]) via phone who reports that she is aware of transfer and will meet patient at SNF. Dtr thanked CSW for assistance and reports she is happy that patient will get rehab because she does not feel she can manage patient's needs at this time. NS agreeable to call dtr to inform her when PTAR arrives.  CSW prepared DC packet with FL2, DC summary and hard scripts included. Dtr requests PTAR for transportation.  PTAR arranged: request #: [id number].  CSW is signing off but available if needed.  [name], [place] [phone]

## 2014-08-05 NOTE — Progress Notes (Signed)
Patient seen and examined at bedside. Patient is medically stable for discharge to skilled nursing facility today. No changes in medical management since 08/04/2014. Please refer to discharge summary completed 08/04/2014.  Manson Passeylma Christion [facility] [phone]

## 2014-08-05 NOTE — Progress Notes (Signed)
[facility] being discharged [facility],report called,and given to Anadarko Petroleum CorporationMonet Cruz RN. Vital signs stable. Transported via EMS to awaiting facility.

## 2014-08-09 ENCOUNTER — Encounter: Payer: Self-pay | Admitting: Internal Medicine

## 2014-08-09 ENCOUNTER — Non-Acute Institutional Stay (SKILLED_NURSING_FACILITY): Payer: Medicare Other | Admitting: Internal Medicine

## 2014-08-09 DIAGNOSIS — F329 Major depressive disorder, single episode, unspecified: Secondary | ICD-10-CM | POA: Diagnosis not present

## 2014-08-09 DIAGNOSIS — F32A Depression, unspecified: Secondary | ICD-10-CM

## 2014-08-09 DIAGNOSIS — B9620 Unspecified Escherichia coli [E. coli] as the cause of diseases classified elsewhere: Secondary | ICD-10-CM

## 2014-08-09 DIAGNOSIS — G9340 Encephalopathy, unspecified: Secondary | ICD-10-CM | POA: Diagnosis not present

## 2014-08-09 DIAGNOSIS — N390 Urinary tract infection, site not specified: Secondary | ICD-10-CM

## 2014-08-09 DIAGNOSIS — R531 Weakness: Secondary | ICD-10-CM | POA: Diagnosis not present

## 2014-08-09 NOTE — Assessment & Plan Note (Signed)
-   Initial concern per neurology for possible Parkinson's disease for which reason pt started on Sinemet. Neurology then decided will hold off on sinemet until mental status better. Sinemet topped 08/03/2014. - Per PT evaluation - recommendation for SNF placement.   

## 2014-08-09 NOTE — Assessment & Plan Note (Signed)
Urinalysis on the admission showed large leukocytes and many bacteria.  - Urine culture positive for E.Coli, pan-sensitive - Initially on Rocephin but changed to Keflex 08/03/2014 which pt will continue for 4 more days on discharge.

## 2014-08-09 NOTE — Assessment & Plan Note (Signed)
Continue BuSpar 15 mg twice daily and remeron - Stable

## 2014-08-09 NOTE — Assessment & Plan Note (Signed)
-   Initial concern per neurology for possible Parkinson's disease for which reason pt started on Sinemet. Neurology then decided will hold off on sinemet until mental status better. Sinemet topped 08/03/2014. - Per PT evaluation - recommendation for SNF placement.

## 2014-08-09 NOTE — Progress Notes (Signed)
MRN: [id number] Name: Susan GasserShirley Traore  Sex: female Age: 80 y.o. DOB: December 08, 1934  [facility] Facility/Room:104 Level Of Care: SNF Provider: Merrilee SeashoreALEXANDER, Athira Janowicz D Emergency Contacts: Extended Emergency Contact Information Primary Emergency Contact: Adams,[name] Address: [address]           [place], [place] [address] [place] Home Phone: [phone] Relation: Daughter  Code Status:   Allergies: Review of patient's allergies indicates no known allergies.  Chief Complaint  Patient presents with  . New Admit To SNF    HPI: Patient is 80 y.o. female who is admitted to SNF for generalized weakness after being admitted for a UTI and possible Parkinson's -firm dx yet to be decided.  Past Medical History  Diagnosis Date  . Anxiety   . High cholesterol   . Depression     Past Surgical History  Procedure Laterality Date  . Denies surgical history        Medication List       This list is accurate as of: 08/09/14  6:28 PM.  Always use your most recent med list.               acetaminophen 325 MG tablet  Commonly known as:  TYLENOL  Take 2 tablets (650 mg total) by mouth every 6 (six) hours as needed for mild pain (or Fever >/= 101).     busPIRone 15 MG tablet  Commonly known as:  BUSPAR  Take 1 tablet (15 mg total) by mouth 2 (two) times daily.     cephALEXin 500 MG capsule  Commonly known as:  KEFLEX  Take 1 capsule (500 mg total) by mouth every 12 (twelve) hours.     loratadine 10 MG tablet  Commonly known as:  CLARITIN  Take 10 mg by mouth daily as needed for allergies (allergies).     mirtazapine 15 MG tablet  Commonly known as:  REMERON  Take 1 tablet (15 mg total) by mouth at bedtime.        No orders of the defined types were placed in this encounter.     There is no immunization history on file for this patient.  History  Substance Use Topics  . Smoking status: Former Smoker    Quit date: 03/04/1996  . Smokeless tobacco:  Not on file  . Alcohol Use: No    Family history is noncontributory    Review of Systems  DATA OBTAINED: from patient, nurse GENERAL:  no fevers, fatigue, appetite changes SKIN: No itching, rash or wounds EYES: No eye pain, redness, discharge EARS: No earache, tinnitus, change in hearing NOSE: No congestion, drainage or bleeding  MOUTH/THROAT: No mouth or tooth pain, No sore throat RESPIRATORY: No cough, wheezing, SOB CARDIAC: No chest pain, palpitations, lower extremity edema  GI: No abdominal pain, No N/V/D or constipation, No heartburn or reflux  GU: No dysuria, frequency or urgency, or incontinence  MUSCULOSKELETAL: No unrelieved bone/joint pain NEUROLOGIC: No headache, dizziness or focal weakness PSYCHIATRIC: No overt anxiety or sadness, No behavior issue.   Filed Vitals:   08/09/14 1822  BP: 108/55  Pulse: 78  Temp: 96.1 F (35.6 C)  Resp: 18    Physical Exam  GENERAL APPEARANCE: Alert,  No acute distress.  SKIN: No diaphoresis rash HEAD: Normocephalic, atraumatic  EYES: Conjunctiva/lids clear. Pupils round, reactive. EOMs intact.  EARS: External exam WNL, canals clear. Hearing grossly normal.  NOSE: No deformity or discharge.  MOUTH/THROAT: Lips w/o lesions  RESPIRATORY:  Breathing is even, unlabored. Lung sounds are clear   CARDIOVASCULAR: Heart RRR no murmurs, rubs or gallops. No peripheral edema.   GASTROINTESTINAL: Abdomen is soft, non-tender, not distended w/ normal bowel sounds. GENITOURINARY: Bladder non tender, not distended  MUSCULOSKELETAL: No abnormal joints or musculature NEUROLOGIC:  Cranial nerves 2-12 grossly intact  PSYCHIATRIC: no behavioral issues  Patient Active Problem List   Diagnosis Date Noted  . E-coli UTI   . Acute encephalopathy 08/02/2014  . Fall 08/02/2014  . Generalized weakness 08/02/2014  . UTI (lower urinary tract infection) 08/01/2014  . Depression 05/02/2014    CBC    Component Value Date/Time   WBC 4.4 08/02/2014  0441   RBC 3.84* 08/02/2014 0441   HGB 11.2* 08/02/2014 0441   HCT 34.6* 08/02/2014 0441   PLT 273 08/02/2014 0441   MCV 90.1 08/02/2014 0441   LYMPHSABS 1.7 07/26/2014 1141   MONOABS 0.6 07/26/2014 1141   EOSABS 0.1 07/26/2014 1141   BASOSABS 0.0 07/26/2014 1141    CMP     Component Value Date/Time   NA 139 08/02/2014 0441   K 3.9 08/02/2014 0441   CL 106 08/02/2014 0441   CO2 25 08/02/2014 0441   GLUCOSE 87 08/02/2014 0441   BUN 17 08/02/2014 0441   CREATININE 0.91 08/02/2014 0441   CALCIUM 8.5* 08/02/2014 0441   PROT 6.9 07/26/2014 1141   ALBUMIN 3.3* 07/26/2014 1141   AST 44* 07/26/2014 1141   ALT 22 07/26/2014 1141   ALKPHOS 56 07/26/2014 1141   BILITOT 0.7 07/26/2014 1141   GFRNONAA 59* 08/02/2014 0441   GFRAA >60 08/02/2014 0441    Assessment and Plan  Acute encephalopathy - Initial concern per neurology for possible Parkinson's disease for which reason pt started on Sinemet. Neurology then decided will hold off on sinemet until mental status better. Sinemet topped 08/03/2014. - Per PT evaluation - recommendation for SNF placement.    Generalized weakness - Initial concern per neurology for possible Parkinson's disease for which reason pt started on Sinemet. Neurology then decided will hold off on sinemet until mental status better. Sinemet topped 08/03/2014. - Per PT evaluation - recommendation for SNF placement.    E-coli UTI Urinalysis on the admission showed large leukocytes and many bacteria.  - Urine culture positive for E.Coli, pan-sensitive - Initially on Rocephin but changed to Keflex 08/03/2014 which pt will continue for 4 more days on discharge.   Depression Continue BuSpar 15 mg twice daily and remeron - Stable    Pt seen 08/06/2014 Margit Hanks, MD

## 2014-09-09 ENCOUNTER — Non-Acute Institutional Stay (SKILLED_NURSING_FACILITY): Payer: Medicare Other | Admitting: Adult Health

## 2014-09-09 DIAGNOSIS — R531 Weakness: Secondary | ICD-10-CM

## 2014-09-09 DIAGNOSIS — G9340 Encephalopathy, unspecified: Secondary | ICD-10-CM | POA: Diagnosis not present

## 2014-09-09 DIAGNOSIS — N390 Urinary tract infection, site not specified: Secondary | ICD-10-CM

## 2014-09-13 ENCOUNTER — Encounter: Payer: Self-pay | Admitting: Adult Health

## 2014-09-13 NOTE — Progress Notes (Signed)
Patient ID: Susan Simmons, female   DOB: 1934/03/09, 80 y.o.   MRN: [id number]    Facility[facility]      No Known Allergies  Chief Complaint  Patient presents with  . Discharge Note    HPI:  She is being discharged to home with home health for pt/ot. She will not need any dme. She will need her prescriptions to be written and will need to follow up with her pcp. She had been hospitalized for uti and weakness. Was admitted to this facility for short term rehab.   Past Medical History  Diagnosis Date  . Anxiety   . High cholesterol   . Depression     Past Surgical History  Procedure Laterality Date  . Denies surgical history      VITAL SIGNS BP 114/62 mmHg  Pulse 70  Ht  (1.651 m)  Wt 123 lb 12.8 oz (56.155 kg)  BMI 20.60 kg/m2  Patient's Medications  New Prescriptions   No medications on file  Previous Medications   ACETAMINOPHEN (TYLENOL) 325 MG TABLET    Take 2 tablets (650 mg total) by mouth every 6 (six) hours as needed for mild pain (or Fever >/= 101).   BUSPIRONE (BUSPAR) 15 MG TABLET    Take 1 tablet (15 mg total) by mouth 2 (two) times daily.   LORATADINE (CLARITIN) 10 MG TABLET    Take 10 mg by mouth daily as needed for allergies (allergies).   MIRTAZAPINE (REMERON) 15 MG TABLET    Take 1 tablet (15 mg total) by mouth at bedtime.  Modified Medications   No medications on file  Discontinued Medications   CEPHALEXIN (KEFLEX) 500 MG CAPSULE    Take 1 capsule (500 mg total) by mouth every 12 (twelve) hours.     SIGNIFICANT DIAGNOSTIC EXAMS  LABS REVIEWED:   08-21-14: urine culture: e-coli: ampicillin   Review of Systems  Constitutional: Negative for appetite change and fatigue.  HENT: Negative for congestion.   Respiratory: Negative for cough, chest tightness and shortness of breath.   Cardiovascular: Negative for chest pain, palpitations and leg swelling.  Gastrointestinal: Negative for nausea, abdominal pain, diarrhea and constipation.   Musculoskeletal: Negative for myalgias and arthralgias.  Skin: Negative for pallor.  Psychiatric/Behavioral: The patient is not nervous/anxious.       Physical Exam  Constitutional: No distress.  Eyes: Conjunctivae are normal.  Neck: Neck supple. No JVD present. No thyromegaly present.  Cardiovascular: Normal rate, regular rhythm and intact distal pulses.   Respiratory: Effort normal and breath sounds normal. No respiratory distress. She has no wheezes.  GI: Soft. Bowel sounds are normal. She exhibits no distension. There is no tenderness.  Musculoskeletal: She exhibits no edema.  Able to move all extremities   Lymphadenopathy:    She has no cervical adenopathy.  Neurological: She is alert.  Skin: Skin is warm and dry. She is not diaphoretic.  Psychiatric: She has a normal mood and affect.       ASSESSMENT/ PLAN:  Will discharge to home with home health for pt/ot to evaluate and treat as indicated for gait, balance, and adl retraining. She will not need dme. Her prescriptions for a 30 day supply of her medications have been written. She has a follow up with Dr. [name] on 09-22-14 at 2 pm   Time spent with patient 40   minutes >50% time spent counseling; reviewing medical record; tests; labs; and developing future plan of care  Ok Edwards NP [facility]  Contact [phone] Monday through Friday 8am- 5pm  After hours call [phone]

## 2014-10-29 ENCOUNTER — Ambulatory Visit: Payer: Medicare Other | Admitting: Neurology

## 2014-11-01 ENCOUNTER — Encounter: Payer: Self-pay | Admitting: Neurology

## 2014-11-01 ENCOUNTER — Telehealth: Payer: Self-pay | Admitting: *Deleted

## 2014-11-01 ENCOUNTER — Ambulatory Visit (INDEPENDENT_AMBULATORY_CARE_PROVIDER_SITE_OTHER): Payer: Medicare Other | Admitting: Neurology

## 2014-11-01 VITALS — BP 133/63 | HR 95 | Ht 65.0 in | Wt 115.6 lb

## 2014-11-01 DIAGNOSIS — G20 Parkinson's disease: Secondary | ICD-10-CM | POA: Diagnosis not present

## 2014-11-01 MED ORDER — CARBIDOPA-LEVODOPA ER 25-100 MG PO TBCR: 1.0000 | EXTENDED_RELEASE_TABLET | Freq: Three times a day (TID) | ORAL | Status: DC

## 2014-11-01 NOTE — Progress Notes (Signed)
[facility]    Provider:  Dr Lucia Gaskins Referring Provider: Clayborn Heron, MD Primary Care Physician:  [name], MD  CC: Shuffling gait  Interval history 11/01/2014: Things are getting worse. Worse with walking. More shuffling. No tremor. Voice not hypophonic, She was in rehab for 6 weeks after UTI with encephalopathy. Not acting out dreams or having vivid dreams. She has some drooling. She has difficulty getting out of a chair, won't get up on her own, won't stand on her own. Maybe some hallucinations, she will see family members that are not there. She has urinary incontinence. She has depression and mood changes. Some days her memory is excellent, other days she seems demented. Patient reports she feels she internally shaking.   HPI: Susan Simmons is a 80 y.o. female here as a referral from Dr. Barbaraann Barthel for   For 5 years, she has had bad depression since her husband passed. She laid in bed for 5 years, stopped doing everything, then had muscle disuse. Things are better now. She has moved in with her daughter. Daughter provides all information. She has been to physical therapy, been to inpatient PT, daughter works every day to keep up her strength, she goes to adult daycare. Patient still has decreased interest, decreased social interest. She can walk normal with encouragement. No falls, instability, Mother says she has tremors, "like I am scared". Daughter says she she has anxiety. Patient says she notice it when using the hand, like when dressing. Decreased concentration.She heasitates, says she is "scared to get up". Symptoms have improved since she first moved in with daughter, and some days she can even "walk normally". She has been on 14 different medications. Unclear use of dopamine blocking agents. Severe worsening depression, refractory.  Review of Systems: Patient complains of symptoms per HPI as well as the following symptoms: No CP, No SOB. Pertinent  negatives per HPI. All others negative.   Social History   Social History  . Marital Status: Divorced    Spouse Name: N/A  . Number of Children: 4  . Years of Education: 11   Occupational History  . Not on file.   Social History Main Topics  . Smoking status: Former Smoker    Quit date: 03/04/1996  . Smokeless tobacco: Not on file  . Alcohol Use: No  . Drug Use: No  . Sexual Activity: Not on file   Other Topics Concern  . Not on file   Social History Narrative   Lives at home with daughter   Caffeine use: none    Family History  Problem Relation Age of Onset  . Dementia Neg Hx   . Parkinsonism Neg Hx     Past Medical History  Diagnosis Date  . Anxiety   . High cholesterol   . Depression     Past Surgical History  Procedure Laterality Date  . Denies surgical history      Current Outpatient Prescriptions  Medication Sig Dispense Refill  . acetaminophen (TYLENOL) 325 MG tablet Take 2 tablets (650 mg total) by mouth every 6 (six) hours as needed for mild pain (or Fever >/= 101). 30 tablet 0  . busPIRone (BUSPAR) 15 MG tablet Take 1 tablet (15 mg total) by mouth 2 (two) times daily. 60 tablet 0  . mirtazapine (REMERON) 15 MG tablet Take 1 tablet (15 mg total) by mouth at bedtime. 30 tablet 0  . loratadine (CLARITIN) 10 MG tablet Take 10 mg by mouth daily as needed for allergies (  allergies).     No current facility-administered medications for this visit.    Allergies as of 11/01/2014  . (No Known Allergies)    Vitals: BP 133/63 mmHg  Pulse 95  Ht  (1.651 m)  Wt 115 lb 9.6 oz (52.436 kg)  BMI 19.24 kg/m2 Last Weight:  Wt Readings from Last 1 Encounters:  11/01/14 115 lb 9.6 oz (52.436 kg)   Last Height:   Ht Readings from Last 1 Encounters:  11/01/14  (1.651 m)     Detailed Neurologic Exam  Generalized psychomotor slowing, not conversant  Speech:  Not aphasic, not dysarthric Cognition: Hypomimia  The patient is oriented to  person, can't tell me the month or year.  recent and remote memory impaired;   language fluent;   Impaired attention, concentration,   fund of knowledge impaired Cranial Nerves:Decreased blink reflex  The pupils are equal, round, and reactive to light. Visual fields are full to finger confrontation. Extraocular movements are intact. Trigeminal sensation is intact and the muscles of mastication are normal. The face is symmetric. The palate elevates in the midline. Hearing intact. Voice is normal. Shoulder shrug is normal. The tongue has normal motion without fasciculations.   Coordination:  bradykinetic  Gait:  significant Shuffling, freezing, decreased arm swing with re-emergent right hand tremor  Motor Observation:  No resting tremor Tone:  increased in the upper extremities with cogwheeling  Posture:  Posture is severely stooped   Strength:  Poor effort, anti-gravity, symmetric   Sensation: intact to LT   Reflex Exam:  DTR's:  Brisk patellar and biceps DTRs Toes:  The toes are downgoing bilaterally.  Clonus:  Clonus is absent.        Assessment/Plan: 80 year old female with severe chronic depression here for evaluation of Parkinson's Disease. She has shuffling gait, decreased arm swing, decreased blink reflex. Today her tone is increased, she is worsening. Will start Sinemet 1/2 tab three times a day and increase to 1 tab 3x a day and look for response.   Spoke to physical therapist and occupational therapist. [phone]. Will request notes.   Naomie Dean, MD  [facility] [address] [place], [place] [address]  Phone [phone] Fax [phone]  A total of 30 minutes was spent face-to-face with this patient. Over half this time was spent on counseling patient on the parkinsonism diagnosis and different diagnostic and therapeutic options available.

## 2014-11-01 NOTE — Telephone Encounter (Signed)
I call [facility] on 11/01/14 requesting patient records to be faxed to medical records dept.

## 2014-11-01 NOTE — Patient Instructions (Signed)
Overall you are doing fairly well but I do want to suggest a few things today:   Remember to drink plenty of fluid, eat healthy meals and do not skip any meals. Try to eat protein with a every meal and eat a healthy snack such as fruit or nuts in between meals. Try to keep a regular sleep-wake schedule and try to exercise daily, particularly in the form of walking, 20-30 minutes a day, if you can.   As far as your medications are concerned, I would like to suggest: Sinemet 1/2 tablet 3x a day. For example 8am, noon and 4pm. -Try to separate Sinemet from food (especially protein-rich foods like meat, dairy, eggs) by about 30-60 mins - this will help the absorption of the medication. If you have some nausea with the medication, you can take it with some light food like crackers or ginger ale   As far as diagnostic testing: Consider MRI of the brain  I would like to see you back in 3 months, sooner if we need to. Please call us with any interim questions, concerns, problems, updates or refill requests.   Our phone number is [phone]. We also have an after hours call service for urgent matters and there is a physician on-call for urgent questions. For any emergencies you know to call 911 or go to the nearest emergency room

## 2014-11-02 ENCOUNTER — Telehealth: Payer: Self-pay | Admitting: *Deleted

## 2014-11-02 NOTE — Telephone Encounter (Signed)
Notes from [facility] on [place] desk.

## 2014-11-08 ENCOUNTER — Encounter (HOSPITAL_COMMUNITY): Payer: Self-pay | Admitting: Emergency Medicine

## 2014-11-08 ENCOUNTER — Telehealth: Payer: Self-pay | Admitting: Neurology

## 2014-11-08 ENCOUNTER — Inpatient Hospital Stay (HOSPITAL_COMMUNITY)
Admission: EM | Admit: 2014-11-08 | Discharge: 2014-11-11 | DRG: 092 | Disposition: A | Discharge status: Skilled Nursing Facility | Payer: Medicare Other | Attending: Internal Medicine | Admitting: Internal Medicine

## 2014-11-08 DIAGNOSIS — Z681 Body mass index (BMI) 19 or less, adult: Secondary | ICD-10-CM

## 2014-11-08 DIAGNOSIS — L8993 Pressure ulcer of unspecified site, stage 3: Secondary | ICD-10-CM | POA: Insufficient documentation

## 2014-11-08 DIAGNOSIS — G20 Parkinson's disease: Secondary | ICD-10-CM | POA: Diagnosis present

## 2014-11-08 DIAGNOSIS — R4182 Altered mental status, unspecified: Secondary | ICD-10-CM

## 2014-11-08 DIAGNOSIS — L8992 Pressure ulcer of unspecified site, stage 2: Secondary | ICD-10-CM

## 2014-11-08 DIAGNOSIS — G9340 Encephalopathy, unspecified: Secondary | ICD-10-CM | POA: Diagnosis present

## 2014-11-08 DIAGNOSIS — E440 Moderate protein-calorie malnutrition: Secondary | ICD-10-CM | POA: Insufficient documentation

## 2014-11-08 DIAGNOSIS — R32 Unspecified urinary incontinence: Secondary | ICD-10-CM | POA: Diagnosis present

## 2014-11-08 DIAGNOSIS — R34 Anuria and oliguria: Secondary | ICD-10-CM | POA: Diagnosis present

## 2014-11-08 DIAGNOSIS — Z79899 Other long term (current) drug therapy: Secondary | ICD-10-CM

## 2014-11-08 DIAGNOSIS — E7800 Pure hypercholesterolemia, unspecified: Secondary | ICD-10-CM | POA: Diagnosis present

## 2014-11-08 DIAGNOSIS — L89152 Pressure ulcer of sacral region, stage 2: Secondary | ICD-10-CM | POA: Diagnosis present

## 2014-11-08 DIAGNOSIS — R627 Adult failure to thrive: Secondary | ICD-10-CM | POA: Diagnosis present

## 2014-11-08 DIAGNOSIS — Z87891 Personal history of nicotine dependence: Secondary | ICD-10-CM

## 2014-11-08 DIAGNOSIS — F419 Anxiety disorder, unspecified: Secondary | ICD-10-CM | POA: Diagnosis present

## 2014-11-08 DIAGNOSIS — F329 Major depressive disorder, single episode, unspecified: Secondary | ICD-10-CM | POA: Diagnosis present

## 2014-11-08 DIAGNOSIS — G92 Toxic encephalopathy: Principal | ICD-10-CM | POA: Diagnosis present

## 2014-11-08 DIAGNOSIS — T50905A Adverse effect of unspecified drugs, medicaments and biological substances, initial encounter: Secondary | ICD-10-CM | POA: Diagnosis present

## 2014-11-08 LAB — CBC WITH DIFFERENTIAL/PLATELET
Basophils Absolute: 0 10*3/uL (ref 0.0–0.1)
Basophils Relative: 1 %
EOS PCT: 3 %
Eosinophils Absolute: 0.2 10*3/uL (ref 0.0–0.7)
HEMATOCRIT: 39.8 % (ref 36.0–46.0)
Hemoglobin: 12.9 g/dL (ref 12.0–15.0)
LYMPHS ABS: 1.3 10*3/uL (ref 0.7–4.0)
LYMPHS PCT: 21 %
MCH: 28.6 pg (ref 26.0–34.0)
MCHC: 32.4 g/dL (ref 30.0–36.0)
MCV: 88.2 fL (ref 78.0–100.0)
Monocytes Absolute: 0.8 10*3/uL (ref 0.1–1.0)
Monocytes Relative: 13 %
Neutro Abs: 3.8 10*3/uL (ref 1.7–7.7)
Neutrophils Relative %: 62 %
PLATELETS: 265 10*3/uL (ref 150–400)
RBC: 4.51 MIL/uL (ref 3.87–5.11)
RDW: 12.8 % (ref 11.5–15.5)
WBC: 6.1 10*3/uL (ref 4.0–10.5)

## 2014-11-08 LAB — COMPREHENSIVE METABOLIC PANEL
ALK PHOS: 58 U/L (ref 38–126)
ALT: 16 U/L (ref 14–54)
AST: 42 U/L — ABNORMAL HIGH (ref 15–41)
Albumin: 2.8 g/dL — ABNORMAL LOW (ref 3.5–5.0)
Anion gap: 7 (ref 5–15)
BUN: 17 mg/dL (ref 6–20)
CALCIUM: 8.8 mg/dL — AB (ref 8.9–10.3)
CO2: 26 mmol/L (ref 22–32)
CREATININE: 0.74 mg/dL (ref 0.44–1.00)
Chloride: 104 mmol/L (ref 101–111)
GFR calc Af Amer: >60 mL/min (ref >60)
Glucose, Bld: 120 mg/dL — ABNORMAL HIGH (ref 65–99)
Potassium: 4 mmol/L (ref 3.5–5.1)
Sodium: 137 mmol/L (ref 135–145)
Total Bilirubin: 0.3 mg/dL (ref 0.3–1.2)
Total Protein: 6.6 g/dL (ref 6.5–8.1)

## 2014-11-08 MED ORDER — SODIUM CHLORIDE 0.9 % IV SOLN
Freq: Once | INTRAVENOUS | Status: AC
  Administered 2014-11-08: 22:00:00 via INTRAVENOUS

## 2014-11-08 NOTE — Telephone Encounter (Signed)
Called and spoke to daughter, [name]. Patient took 2 days worth of Carbidopa/levidopa and she has since stopped the medication because she would hardly eat or drink those two days. She was sleeping a lot as well. Patient just started to eat or drink now. Daughter is feeding her currently because she cannot feed herself. Patient was not like this before medication. She has gotten her to eat bites of grapes, chips,and meat. Very very little per daughter. She is going to the bathroom, but her urine is dark yellow. She has been checked for UTI's in the past, but not recently. I advised I will message the work in doctor, Dr. Frances Furbish and have her call them. If she gets worse or has new symptoms, she should take her to the emergency room. She verbalized understanding.

## 2014-11-08 NOTE — ED Notes (Signed)
Pt presents POV for decreased appetite, decreased BM, last BM 4 days ago.

## 2014-11-08 NOTE — ED Provider Notes (Signed)
CSN: [id number]     Arrival date & time 11/08/14  1827 History   First MD Initiated Contact with Patient 11/08/14 2028     Chief Complaint  Patient presents with  . Failure To Thrive     (Consider location/radiation/quality/duration/timing/severity/associated sxs/prior Treatment) HPI  The patient is a 80 year old female, she has a history of depression and anxiety, she was recently seen in the neurology offices and receptive treatment for Parkinson's disease was started with carbidopa levodopa. After starting this medication the family members noted that she had a significant decline in her status and has not been able to get out of the bed, she will not open her eyes, she will not help to perform activities of daily living including eating, cleaning and she will not even talk to people. She has made very little urine, she has had virtually nothing to eat or drink in the last 2 days. It was recommended that she come to the hospital tonight by the neurologist according to the patient's family report.  Past Medical History  Diagnosis Date  . Anxiety   . High cholesterol   . Depression    Past Surgical History  Procedure Laterality Date  . Denies surgical history     Family History  Problem Relation Age of Onset  . Dementia Neg Hx   . Parkinsonism Neg Hx    Social History  Substance Use Topics  . Smoking status: Former Smoker    Quit date: 03/04/1996  . Smokeless tobacco: None  . Alcohol Use: No   OB History    No data available     Review of Systems  All other systems reviewed and are negative.     Allergies  Review of patient's allergies indicates no known allergies.  Home Medications   Prior to Admission medications   Medication Sig Start Date End Date Taking? Authorizing Provider  acetaminophen (TYLENOL) 325 MG tablet Take 2 tablets (650 mg total) by mouth every 6 (six) hours as needed for mild pain (or Fever >/= 101). 08/04/14  Yes Alison Murray, MD  busPIRone  (BUSPAR) 15 MG tablet Take 1 tablet (15 mg total) by mouth 2 (two) times daily. 08/04/14  Yes Alison Murray, MD  mirtazapine (REMERON) 15 MG tablet Take 1 tablet (15 mg total) by mouth at bedtime. 08/04/14  Yes Alison Murray, MD  Carbidopa-Levodopa ER (SINEMET CR) 25-100 MG tablet controlled release Take 1 tablet by mouth 3 (three) times daily. Patient not taking: Reported on 11/08/2014 11/01/14   Anson Fret, MD   BP 116/53 mmHg  Pulse 87  Temp(Src) 98.1 F (36.7 C) (Oral)  Resp 18  Ht  (1.626 m)  Wt 118 lb (53.524 kg)  BMI 20.24 kg/m2  SpO2 98% Physical Exam  Constitutional: She appears well-developed and well-nourished. No distress.  HENT:  Head: Normocephalic and atraumatic.  Mouth/Throat: Oropharynx is clear and moist. No oropharyngeal exudate.  Eyes: Conjunctivae and EOM are normal. Pupils are equal, round, and reactive to light. Right eye exhibits no discharge. Left eye exhibits no discharge. No scleral icterus.  Neck: Normal range of motion. Neck supple. No JVD present. No thyromegaly present.  Cardiovascular: Normal rate, regular rhythm and intact distal pulses.  Exam reveals no gallop and no friction rub.   Murmur ( mid systolic murmur) heard. Pulmonary/Chest: Effort normal and breath sounds normal. No respiratory distress. She has no wheezes. She has no rales.  Abdominal: Soft. Bowel sounds are normal. She exhibits no  distension and no mass. There is no tenderness.  Musculoskeletal: Normal range of motion. She exhibits no edema or tenderness.  Lymphadenopathy:    She has no cervical adenopathy.  Neurological: She is alert. Coordination normal.  Obtunded, arises too loud sound or painful stimuli, some difficult a following commands, occasionally opens her mouth less than 1 cm, can grip weakly bilaterally  Skin: Skin is warm and dry. No rash noted. No erythema.  Psychiatric: She has a normal mood and affect. Her behavior is normal.  Nursing note and vitals  reviewed.   ED Course  Procedures (including critical care time) Labs Review Labs Reviewed  COMPREHENSIVE METABOLIC PANEL - Abnormal; Notable for the following:    Glucose, Bld 120 (*)    Calcium 8.8 (*)    Albumin 2.8 (*)    AST 42 (*)    All other components within normal limits  CBC WITH DIFFERENTIAL/PLATELET  URINALYSIS, ROUTINE W REFLEX MICROSCOPIC (NOT AT [facility])  BLOOD GAS, ARTERIAL  AMMONIA    Imaging Review No results found. I have personally reviewed and evaluated these images and lab results as part of my medical decision-making.   EKG Interpretation   Date/Time:  Monday November 08 2014 21:01:16 EDT Ventricular Rate:  86 PR Interval:  131 QRS Duration: 100 QT Interval:  403 QTC Calculation: 482 R Axis:   -28 Text Interpretation:  Sinus rhythm LVH with secondary repolarization  abnormality since last tracing no significant change Abnormal ekg  Confirmed by Hyacinth Meeker  MD, Baelynn Schmuhl ([id number]) on 11/08/2014 10:05:38 PM      MDM   Final diagnoses:  Altered mental status, unspecified altered mental status type    The patient has a significant decline in her mental status, her vital signs otherwise are unremarkable and she is maintaining her airway without difficulty. We'll obtain labs, this may be related to the medications that were started, they have stopped them now. Their family member Miss Shelvin is still significantly symptomatic and will likely need admission to the hospital for observation and treatment of underlying illnesses that may be causing this. Urinalysis, labs, IV fluids.  Labs unremarkable -   Anuric - nothing in the bladder  VS OK, but MS remains depressed - d/w Dr. Toniann Fail - will admit.  Eber Hong, MD 11/08/14 [phone]

## 2014-11-08 NOTE — Telephone Encounter (Signed)
Spoke to her daughter, Susan Simmons. She has been declining in her eating and drinking and could not tolerate the carbidopa/levodopa. Things started to get worse since mid last week.  She does not take her other meds either. Took some Ensure today, some grapes. I told her that she needs to push for oral intake and oral fluids in particular. She can get dehydrated quickly. She is advised to monitor mother carefully and if she declines and becomes confused or lethargic she may have to take her to the emergency room. Otherwise, she may be able to take her to the primary care tomorrow. Her daughter demonstrated understanding and agreement.

## 2014-11-08 NOTE — Telephone Encounter (Signed)
Tried calling daughter, [name] back. Voicemail full and could not leave a message.

## 2014-11-08 NOTE — ED Notes (Signed)
In and out Cath unsuccessful. Pt is "dry".  RN at bedside. EDP Hyacinth Meeker notified

## 2014-11-08 NOTE — Telephone Encounter (Signed)
Patient's daughter [name] is calling to let you know how her medication carbidopal is doing for the patient.  Please call.

## 2014-11-09 ENCOUNTER — Inpatient Hospital Stay (HOSPITAL_COMMUNITY): Payer: Medicare Other

## 2014-11-09 ENCOUNTER — Observation Stay (HOSPITAL_COMMUNITY): Payer: Medicare Other

## 2014-11-09 ENCOUNTER — Encounter (HOSPITAL_COMMUNITY): Payer: Self-pay | Admitting: Internal Medicine

## 2014-11-09 DIAGNOSIS — G20 Parkinson's disease: Secondary | ICD-10-CM | POA: Diagnosis present

## 2014-11-09 DIAGNOSIS — F419 Anxiety disorder, unspecified: Secondary | ICD-10-CM | POA: Diagnosis present

## 2014-11-09 DIAGNOSIS — Z681 Body mass index (BMI) 19 or less, adult: Secondary | ICD-10-CM | POA: Diagnosis not present

## 2014-11-09 DIAGNOSIS — G9340 Encephalopathy, unspecified: Secondary | ICD-10-CM | POA: Diagnosis present

## 2014-11-09 DIAGNOSIS — L89152 Pressure ulcer of sacral region, stage 2: Secondary | ICD-10-CM | POA: Diagnosis present

## 2014-11-09 DIAGNOSIS — L8993 Pressure ulcer of unspecified site, stage 3: Secondary | ICD-10-CM | POA: Insufficient documentation

## 2014-11-09 DIAGNOSIS — L8992 Pressure ulcer of unspecified site, stage 2: Secondary | ICD-10-CM

## 2014-11-09 DIAGNOSIS — E7800 Pure hypercholesterolemia, unspecified: Secondary | ICD-10-CM | POA: Diagnosis present

## 2014-11-09 DIAGNOSIS — R4182 Altered mental status, unspecified: Secondary | ICD-10-CM | POA: Diagnosis not present

## 2014-11-09 DIAGNOSIS — Z87891 Personal history of nicotine dependence: Secondary | ICD-10-CM | POA: Diagnosis not present

## 2014-11-09 DIAGNOSIS — E440 Moderate protein-calorie malnutrition: Secondary | ICD-10-CM | POA: Diagnosis present

## 2014-11-09 DIAGNOSIS — Z79899 Other long term (current) drug therapy: Secondary | ICD-10-CM | POA: Diagnosis not present

## 2014-11-09 DIAGNOSIS — R259 Unspecified abnormal involuntary movements: Secondary | ICD-10-CM | POA: Diagnosis not present

## 2014-11-09 DIAGNOSIS — R531 Weakness: Secondary | ICD-10-CM | POA: Diagnosis not present

## 2014-11-09 DIAGNOSIS — R32 Unspecified urinary incontinence: Secondary | ICD-10-CM | POA: Diagnosis present

## 2014-11-09 DIAGNOSIS — R627 Adult failure to thrive: Secondary | ICD-10-CM | POA: Diagnosis present

## 2014-11-09 DIAGNOSIS — F329 Major depressive disorder, single episode, unspecified: Secondary | ICD-10-CM | POA: Diagnosis present

## 2014-11-09 DIAGNOSIS — R34 Anuria and oliguria: Secondary | ICD-10-CM | POA: Diagnosis present

## 2014-11-09 LAB — COMPREHENSIVE METABOLIC PANEL
ALBUMIN: 2.5 g/dL — AB (ref 3.5–5.0)
ALK PHOS: 50 U/L (ref 38–126)
ALT: 14 U/L (ref 14–54)
ANION GAP: 8 (ref 5–15)
AST: 36 U/L (ref 15–41)
BUN: 16 mg/dL (ref 6–20)
CALCIUM: 8.4 mg/dL — AB (ref 8.9–10.3)
CHLORIDE: 105 mmol/L (ref 101–111)
CO2: 25 mmol/L (ref 22–32)
Creatinine, Ser: 0.69 mg/dL (ref 0.44–1.00)
GFR calc non Af Amer: >60 mL/min (ref >60)
GLUCOSE: 94 mg/dL (ref 65–99)
POTASSIUM: 3.8 mmol/L (ref 3.5–5.1)
SODIUM: 138 mmol/L (ref 135–145)
Total Bilirubin: 0.4 mg/dL (ref 0.3–1.2)
Total Protein: 5.9 g/dL — ABNORMAL LOW (ref 6.5–8.1)

## 2014-11-09 LAB — RAPID URINE DRUG SCREEN, HOSP PERFORMED
AMPHETAMINES: NOT DETECTED
Barbiturates: NOT DETECTED
Benzodiazepines: NOT DETECTED
Cocaine: NOT DETECTED
OPIATES: NOT DETECTED
Tetrahydrocannabinol: NOT DETECTED

## 2014-11-09 LAB — CBC WITH DIFFERENTIAL/PLATELET
BASOS PCT: 1 %
Basophils Absolute: 0 10*3/uL (ref 0.0–0.1)
Eosinophils Absolute: 0.3 10*3/uL (ref 0.0–0.7)
Eosinophils Relative: 6 %
HCT: 36.7 % (ref 36.0–46.0)
HEMOGLOBIN: 11.7 g/dL — AB (ref 12.0–15.0)
LYMPHS PCT: 39 %
Lymphs Abs: 2 10*3/uL (ref 0.7–4.0)
MCH: 28.1 pg (ref 26.0–34.0)
MCHC: 31.9 g/dL (ref 30.0–36.0)
MCV: 88.2 fL (ref 78.0–100.0)
MONO ABS: 0.6 10*3/uL (ref 0.1–1.0)
MONOS PCT: 13 %
NEUTROS ABS: 2.2 10*3/uL (ref 1.7–7.7)
NEUTROS PCT: 43 %
PLATELETS: 255 10*3/uL (ref 150–400)
RBC: 4.16 MIL/uL (ref 3.87–5.11)
RDW: 12.9 % (ref 11.5–15.5)
WBC: 5.1 10*3/uL (ref 4.0–10.5)

## 2014-11-09 LAB — URINALYSIS, ROUTINE W REFLEX MICROSCOPIC
GLUCOSE, UA: NEGATIVE mg/dL
HGB URINE DIPSTICK: NEGATIVE
Ketones, ur: NEGATIVE mg/dL
Leukocytes, UA: NEGATIVE
Nitrite: NEGATIVE
PH: 5.5 (ref 5.0–8.0)
Protein, ur: NEGATIVE mg/dL
SPECIFIC GRAVITY, URINE: 1.031 — AB (ref 1.005–1.030)
Urobilinogen, UA: 1 mg/dL (ref 0.0–1.0)

## 2014-11-09 LAB — BLOOD GAS, ARTERIAL
Acid-Base Excess: 1.1 mmol/L (ref 0.0–2.0)
BICARBONATE: 24.4 meq/L — AB (ref 20.0–24.0)
Drawn by: 11249
FIO2: 0.21
O2 SAT: 97.1 %
PATIENT TEMPERATURE: 98
PO2 ART: 88.3 mmHg (ref 80.0–100.0)
TCO2: 21.7 mmol/L (ref 0–100)
pCO2 arterial: 35.3 mmHg (ref 35.0–45.0)
pH, Arterial: 7.452 — ABNORMAL HIGH (ref 7.350–7.450)

## 2014-11-09 LAB — MRSA PCR SCREENING: MRSA by PCR: POSITIVE — AB

## 2014-11-09 LAB — AMMONIA: Ammonia: 11 umol/L (ref 9–35)

## 2014-11-09 MED ORDER — CARBIDOPA-LEVODOPA 25-100 MG PO TABS
1.0000 | ORAL_TABLET | Freq: Two times a day (BID) | ORAL | Status: DC
  Administered 2014-11-10 – 2014-11-11 (×4): 1 via ORAL
  Filled 2014-11-09 (×4): qty 1

## 2014-11-09 MED ORDER — ONDANSETRON HCL 4 MG/2ML IJ SOLN: 4.0000 mg | Freq: Three times a day (TID) | INTRAMUSCULAR | Status: DC | PRN

## 2014-11-09 MED ORDER — SENNOSIDES-DOCUSATE SODIUM 8.6-50 MG PO TABS: 1.0000 | ORAL_TABLET | Freq: Every evening | ORAL | Status: DC | PRN

## 2014-11-09 MED ORDER — SODIUM CHLORIDE 0.9 % IV SOLN: INTRAVENOUS | Status: DC

## 2014-11-09 MED ORDER — ENOXAPARIN SODIUM 40 MG/0.4ML ~~LOC~~ SOLN
40.0000 mg | SUBCUTANEOUS | Status: DC
  Administered 2014-11-09 – 2014-11-11 (×3): 40 mg via SUBCUTANEOUS
  Filled 2014-11-09 (×3): qty 0.4

## 2014-11-09 MED ORDER — SODIUM CHLORIDE 0.9 % IV SOLN
INTRAVENOUS | Status: AC
  Administered 2014-11-09 (×2): via INTRAVENOUS

## 2014-11-09 NOTE — Consult Note (Signed)
Reason for Consult:Increased tone Referring Physician: Feliz-Ortiz  CC: Increased tone, confused  HPI: Susan Simmons is an 80 y.o. female who due to confusion is unable to provide any history today.  No family in the room.  All history obtained from the chart.  Patient seen as an inpatient for gait difficulties in June of this year.  Had a UTI as well and was felt to be delirious.  She was given a Sinemet trial and had worsening of her mental status but improvement in her tone.  Sinemet was discontinued to be tried again on an outpatient basis.  The patient was in rehab 6 months after UTI but has continued to decline.  Last seen by Dr. Lucia Gaskins on an outpatient basis at which time complaints included difficulty getting out of a chair, won't get up on her own, won't stand on her own, maybe some hallucinations, she will see family members that are not there, she has urinary incontinence. She has depression and mood changes. Some days her memory is excellent, other days she seems demented. She also felt she was internally shaking.  Was started on Sinemet on 9/26.  With presentation on 10/4 had vomiting, increased somnolence and decreased appetite.   Has a history of severe depression, having laid in the bed for 5 years and having muscle disuse as a consequence.  Did seem to recover.    Past Medical History  Diagnosis Date  . Anxiety   . High cholesterol   . Depression     Past Surgical History  Procedure Laterality Date  . Denies surgical history      Family History  Problem Relation Age of Onset  . Dementia Neg Hx   . Parkinsonism Neg Hx     Social History:  reports that she quit smoking about 18 years ago. She does not have any smokeless tobacco history on file. She reports that she does not drink alcohol or use illicit drugs.  No Known Allergies  Medications:  I have reviewed the patient's current medications. Prior to Admission:  Prescriptions prior to admission  Medication Sig  Dispense Refill Last Dose  . acetaminophen (TYLENOL) 325 MG tablet Take 2 tablets (650 mg total) by mouth every 6 (six) hours as needed for mild pain (or Fever >/= 101). 30 tablet 0 unknown  . busPIRone (BUSPAR) 15 MG tablet Take 1 tablet (15 mg total) by mouth 2 (two) times daily. 60 tablet 0 11/05/2014  . mirtazapine (REMERON) 15 MG tablet Take 1 tablet (15 mg total) by mouth at bedtime. 30 tablet 0 11/05/2014  . Carbidopa-Levodopa ER (SINEMET CR) 25-100 MG tablet controlled release Take 1 tablet by mouth 3 (three) times daily. (Patient not taking: Reported on 11/08/2014) 90 tablet 11 11/01/2014   Scheduled: . enoxaparin (LOVENOX) injection  40 mg Subcutaneous Q24H    ROS: History obtained from the patient and chart  General ROS: loss of appetite Psychological ROS: negative for - behavioral disorder, hallucinations, memory difficulties, mood swings or suicidal ideation Ophthalmic ROS: negative for - blurry vision, double vision, eye pain or loss of vision ENT ROS: negative for - epistaxis, nasal discharge, oral lesions, sore throat, tinnitus or vertigo Allergy and Immunology ROS: negative for - hives or itchy/watery eyes Hematological and Lymphatic ROS: negative for - bleeding problems, bruising or swollen lymph nodes Endocrine ROS: negative for - galactorrhea, hair pattern changes, polydipsia/polyuria or temperature intolerance Respiratory ROS: negative for - cough, hemoptysis, shortness of breath or wheezing Cardiovascular ROS: negative for -  chest pain, dyspnea on exertion, edema or irregular heartbeat Gastrointestinal ROS: as noted in HPI  Genito-Urinary ROS: as noted in HPI Musculoskeletal ROS: negative for - joint swelling or muscular weakness Neurological ROS: as noted in HPI Dermatological ROS: negative for rash and skin lesion changes  Physical Examination: Blood pressure 117/41, pulse 72, temperature 98.1 F (36.7 C), temperature source Axillary, resp. rate 17, height 5\' 4"   (1.626 m), weight 52.028 kg (114 lb 11.2 oz), SpO2 100 %.  HEENT-  Normocephalic, no lesions, without obvious abnormality.  Normal external eye and conjunctiva.  Normal TM's bilaterally.  Normal auditory canals and external ears. Normal external nose, mucus membranes and septum.  Normal pharynx. Cardiovascular- S1, S2 normal, pulses palpable throughout   Lungs- chest clear, no wheezing, rales, normal symmetric air entry Abdomen- soft, non-tender; bowel sounds normal; no masses,  no organomegaly Extremities- no edema Lymph-no adenopathy palpable Musculoskeletal-no joint tenderness, deformity or swelling Skin-warm and dry, no hyperpigmentation, vitiligo, or suspicious lesions  Neurological Examination Mental Status: Alert, oriented to name and being in the hospital.  Thinks it is 1990.  Reports that she does not know anything else.  Speech fluent without evidence of aphasia.  Able to follow simple commands without difficulty. Cranial Nerves: II: Discs flat bilaterally; Visual fields grossly normal, pupils equal, round, reactive to light and accommodation III,IV, VI: ptosis not present, extra-ocular motions intact bilaterally V,VII: smile symmetric, facial light touch sensation normal bilaterally VIII: hearing normal bilaterally IX,X: gag reflex present XI: bilateral shoulder shrug XII: midline tongue extension Motor: Patient moves all extremities against gravity but weakly.  No focal weakness noted.  Increased tone throughout.   Sensory: Pinprick and light touch intact throughout, bilaterally Deep Tendon Reflexes: 2+ in the upper extremities and absent in the lower extremities.   Plantars: Right: mute   Left: mute Cerebellar: Normal finger-to-nose testing in the bilateral upper extremity with mild tremor noted.  Unable to perform in the lower extremities due to tone.   Gait: not tested due to safety concerns    Laboratory Studies:   Basic Metabolic Panel:  Recent Labs Lab  11/08/14 2108 11/09/14 0710  NA 137 138  K 4.0 3.8  CL 104 105  CO2 26 25  GLUCOSE 120* 94  BUN 17 16  CREATININE 0.74 0.69  CALCIUM 8.8* 8.4*    Liver Function Tests:  Recent Labs Lab 11/08/14 2108 11/09/14 0710  AST 42* 36  ALT 16 14  ALKPHOS 58 50  BILITOT 0.3 0.4  PROT 6.6 5.9*  ALBUMIN 2.8* 2.5*   No results for input(s): LIPASE, AMYLASE in the last 168 hours.  Recent Labs Lab 11/09/14 0056  AMMONIA 11    CBC:  Recent Labs Lab 11/08/14 2108 11/09/14 0710  WBC 6.1 5.1  NEUTROABS 3.8 2.2  HGB 12.9 11.7*  HCT 39.8 36.7  MCV 88.2 88.2  PLT 265 255    Cardiac Enzymes: No results for input(s): CKTOTAL, CKMB, CKMBINDEX, TROPONINI in the last 168 hours.  BNP: Invalid input(s): POCBNP  CBG: No results for input(s): GLUCAP in the last 168 hours.  Microbiology: Results for orders placed or performed during the hospital encounter of 11/08/14  MRSA PCR Screening     Status: Abnormal   Collection Time: 11/09/14  2:53 AM  Result Value Ref Range Status   MRSA by PCR POSITIVE (A) NEGATIVE Final    Comment:        The GeneXpert MRSA Assay (FDA approved for NASAL specimens only), is one  component of a comprehensive MRSA colonization surveillance program. It is not intended to diagnose MRSA infection nor to guide or monitor treatment for MRSA infections. RESULT CALLED TO, READ BACK BY AND VERIFIED WITH: LAMB,S/4W  ON 11/09/14 BY KARCZEWSKI,S.     Coagulation Studies: No results for input(s): LABPROT, INR in the last 72 hours.  Urinalysis:  Recent Labs Lab 11/09/14 0759  COLORURINE AMBER*  LABSPEC 1.031*  PHURINE 5.5  GLUCOSEU NEGATIVE  HGBUR NEGATIVE  BILIRUBINUR MODERATE*  KETONESUR NEGATIVE  PROTEINUR NEGATIVE  UROBILINOGEN 1.0  NITRITE NEGATIVE  LEUKOCYTESUR NEGATIVE    Lipid Panel:  No results found for: CHOL, TRIG, HDL, CHOLHDL, VLDL, LDLCALC  HgbA1C: No results found for: HGBA1C  Urine Drug Screen:     Component Value  Date/Time   LABOPIA NONE DETECTED 11/09/2014 0758   COCAINSCRNUR NONE DETECTED 11/09/2014 0758   LABBENZ NONE DETECTED 11/09/2014 0758   AMPHETMU NONE DETECTED 11/09/2014 0758   THCU NONE DETECTED 11/09/2014 0758   LABBARB NONE DETECTED 11/09/2014 0758    Alcohol Level: No results for input(s): ETH in the last 168 hours.  Other results: EKG: sinus rhythm at 86 bpm, LVH.  Imaging: Ct Head Wo Contrast  11/09/2014   CLINICAL DATA:  New medications for Parkinson's disease. Now with decreased activity and decreased interpersonal interactions.  EXAM: CT HEAD WITHOUT CONTRAST  TECHNIQUE: Contiguous axial images were obtained from the base of the skull through the vertex without intravenous contrast.  COMPARISON:  08/01/2014  FINDINGS: There is no intracranial hemorrhage, mass or evidence of acute infarction. There is moderate generalized atrophy. There is moderate chronic microvascular ischemic change. There is no significant extra-axial fluid collection.  No acute intracranial findings are evident.  IMPRESSION: No acute findings. There is moderate generalized atrophy and chronic small vessel disease.   Electronically Signed   By: Ellery Plunk M.D.   On: 11/09/2014 01:10   Dg Chest Port 1 View  11/09/2014   CLINICAL DATA:  Acute onset of encephalopathy. Patient unresponsive. Admission chest radiograph. Initial encounter.  EXAM: PORTABLE CHEST 1 VIEW  COMPARISON:  Chest radiograph performed 07/26/2014  FINDINGS: The lungs are well-aerated and clear. There is no evidence of focal opacification, pleural effusion or pneumothorax.  The cardiomediastinal silhouette is borderline normal in size. No acute osseous abnormalities are seen.  IMPRESSION: No acute cardiopulmonary process seen.   Electronically Signed   By: Roanna Raider M.D.   On: 11/09/2014 01:37     Assessment/Plan: 80 year old female with a history of Parkinsonism that has been progressive over many years.  Unclear if this is true PD at  this point or a Parkinson's variant.  Also unclear if patient is truly intolerant to Sinemet.  Patient has been on long acting Sinemet which may be posing a problem.  It does appear from the records that she has had a reasonable response to Sinemet in the past.    Recommendations: 1.  Sinemet 25/100 at 0900 and 1500.  Will follow for response.    Thana Farr, MD [facility] [phone] 11/09/2014, 12:27 PM

## 2014-11-09 NOTE — Evaluation (Signed)
Clinical/Bedside Swallow Evaluation Patient Details  Name: Susan Simmons MRN: [id number] Date of Birth: 1934/04/06  Today's Date: 11/09/2014 Time: SLP Start Time (ACUTE ONLY): 0945 SLP Stop Time (ACUTE ONLY): 1005 SLP Time Calculation (min) (ACUTE ONLY): 20 min  Past Medical History:  Past Medical History  Diagnosis Date  . Anxiety   . High cholesterol   . Depression    Past Surgical History:  Past Surgical History  Procedure Laterality Date  . Denies surgical history     HPI:  80 yo female adm to [facility] with lethargy, AMS after starting Sinemet - diagnosed with acute encephalopathy.  PMH + for Parkinson's-like disease.  Swallow eval ordered for 10/4 and speech evaluation ordered for 10/5.  Pt CXR and CT head both negative.    Assessment / Plan / Recommendation Clinical Impression  Despite pt's lethargy, swallow appears only mildly impaired.  Delayed oral transiting with oral holding apparent - suspect only due to mentation.   Pt accepted water via tsp, cup, straw, applesauce via tsp and solid.  Slow mastication with pt requiring verbal cue and applesauce to aid swallow noted.  Oral transiting was more timely with liquids due to increased velocity. No s/s of aspiration with all po observed.  Recommend to initiate a regular/thin diet = feeding pt only when fully alert.  SLP to follow up briefly for pt/family education and determine indication for instrumental evaluation.  SLE to be done 10/5 as ordered.  Thanks.     Aspiration Risk    Moderate if not alert   Diet Recommendation Age appropriate regular solids;Thin   Medication Administration: Whole meds with puree (with thin liquid if fully alert and tolerate) Compensations: Slow rate;Small sips/bites;Check for pocketing    Other  Recommendations Oral Care Recommendations: Oral care BID   Follow Up Recommendations       Frequency and Duration min 1 x/week  1 week   Pertinent Vitals/Pain Afebrile, decreased      Swallow  Study Prior Functional Status   see hhx    General Date of Onset: 11/09/14 Other Pertinent Information: 80 yo female adm to [facility] with lethargy, AMS after starting Sinemet - diagnosed with acute encephalopathy.  PMH + for Parkinson's-like disease.  Swallow eval ordered for 10/4 and speech evaluation ordered for 10/5.  Pt CXR and CT head both negative.  Type of Study: Bedside swallow evaluation Diet Prior to this Study: NPO Temperature Spikes Noted: No Respiratory Status: Room air History of Recent Intubation: No Behavior/Cognition: Requires cueing;Lethargic/Drowsy Oral Cavity - Dentition: Adequate natural dentition/normal for age Self-Feeding Abilities: Total assist Patient Positioning: Upright in bed Baseline Vocal Quality: Low vocal intensity Volitional Cough: Cognitively unable to elicit Volitional Swallow: Unable to elicit    Oral/Motor/Sensory Function Overall Oral Motor/Sensory Function:  (generalized weakness, no focal cn deficits)   Ice Chips Ice chips: Not tested   Thin Liquid Thin Liquid: Impaired Presentation: Cup;Straw;Spoon Pharyngeal  Phase Impairments: Suspected delayed Swallow    Nectar Thick Nectar Thick Liquid: Not tested   Honey Thick Honey Thick Liquid: Not tested   Puree Puree: Impaired Presentation: Spoon Oral Phase Impairments: Reduced lingual movement/coordination;Impaired anterior to posterior transit Oral Phase Functional Implications: Prolonged oral transit   Solid   GO    Solid: Impaired Oral Phase Impairments: Poor awareness of bolus;Reduced lingual movement/coordination Oral Phase Functional Implications: Oral holding Other Comments: use of applesauce helpful to clear oral residuals - excessive holding due to mental status noted       Donavan Burnet,  MS [facility] SLP [phone]

## 2014-11-09 NOTE — H&P (Signed)
[facility] History and Physical  Susan Simmons ZOX:[id number] DOB: 1934/05/26 DOA: 11/08/2014  Referring physician: Dr. Hyacinth Meeker. PCP: [name], MD  Specialists: Dr. Frances Furbish. Neurologist.  Chief Complaint: Weakness and somnolence.  HPI: Susan Simmons is a 80 y.o. female with history of depression and Parkinson-like symptoms who was recently started restarted on Sinemet on Monday 11/01/2014 by patient's neurologist was found to have increasing somnolence and decreased appetite and also was found to have some regurgitation of food and patient is unable to eat well. Patient was advised to come to the ER. In the ER patient was found to have muscle rigidity and appears confused. Patient is afebrile. UA still pending. Chest x-ray and CT head is unremarkable. Labs are largely unremarkable. As per patient's daughter patient has poor appetite and has been increasingly somnolent. When she wakes up in the morning some vomitus is found on the bed. Did not have any diarrhea.  Review of Systems: As presented in the history of presenting illness, rest negative.  Past Medical History  Diagnosis Date  . Anxiety   . High cholesterol   . Depression    Past Surgical History  Procedure Laterality Date  . Denies surgical history     Social History:  reports that she quit smoking about 18 years ago. She does not have any smokeless tobacco history on file. She reports that she does not drink alcohol or use illicit drugs. Where does patient live home. Can patient participate in ADLs? Yes.  No Known Allergies  Family History:  Family History  Problem Relation Age of Onset  . Dementia Neg Hx   . Parkinsonism Neg Hx       Prior to Admission medications   Medication Sig Start Date End Date Taking? Authorizing Provider  acetaminophen (TYLENOL) 325 MG tablet Take 2 tablets (650 mg total) by mouth every 6 (six) hours as needed for mild pain (or Fever >/= 101). 08/04/14  Yes Alison Murray, MD   busPIRone (BUSPAR) 15 MG tablet Take 1 tablet (15 mg total) by mouth 2 (two) times daily. 08/04/14  Yes Alison Murray, MD  mirtazapine (REMERON) 15 MG tablet Take 1 tablet (15 mg total) by mouth at bedtime. 08/04/14  Yes Alison Murray, MD  Carbidopa-Levodopa ER (SINEMET CR) 25-100 MG tablet controlled release Take 1 tablet by mouth 3 (three) times daily. Patient not taking: Reported on 11/08/2014 11/01/14   Anson Fret, MD    Physical Exam: Filed Vitals:   11/08/14 2330 11/09/14 0000 11/09/14 0040 11/09/14 0100  BP: 129/54 125/55 104/39 126/37  Pulse: 83 82  85  Temp:      TempSrc:      Resp: Height:      Weight:      SpO2: 99% 99% 100% 99%     General:  Moderately built and nourished.  Eyes: Anicteric no pallor.  ENT: No discharge from the ears eyes nose and mouth.  Neck: No mass felt.  Cardiovascular: S1 and S2 heard.  Respiratory: No rhonchi or crepitations.  Abdomen: Soft nontender bowel sounds present.  Skin: No rash.  Musculoskeletal: No edema.  Psychiatric: Appears confused.  Neurologic: Alert awake and oriented to name and severe rigidity all over. Perla positive.  Labs on Admission:  Basic Metabolic Panel:  Recent Labs Lab 11/08/14 2108  NA 137  K 4.0  CL 104  CO2 26  GLUCOSE 120*  BUN 17  CREATININE 0.74  CALCIUM 8.8*   Liver  Function Tests:  Recent Labs Lab 11/08/14 2108  AST 42*  ALT 16  ALKPHOS 58  BILITOT 0.3  PROT 6.6  ALBUMIN 2.8*   No results for input(s): LIPASE, AMYLASE in the last 168 hours. No results for input(s): AMMONIA in the last 168 hours. CBC:  Recent Labs Lab 11/08/14 2108  WBC 6.1  NEUTROABS 3.8  HGB 12.9  HCT 39.8  MCV 88.2  PLT 265   Cardiac Enzymes: No results for input(s): CKTOTAL, CKMB, CKMBINDEX, TROPONINI in the last 168 hours.  BNP (last 3 results) No results for input(s): BNP in the last 8760 hours.  ProBNP (last 3 results) No results for input(s): PROBNP in the last 8760  hours.  CBG: No results for input(s): GLUCAP in the last 168 hours.  Radiological Exams on Admission: Ct Head Wo Contrast  11/09/2014   CLINICAL DATA:  New medications for Parkinson's disease. Now with decreased activity and decreased interpersonal interactions.  EXAM: CT HEAD WITHOUT CONTRAST  TECHNIQUE: Contiguous axial images were obtained from the base of the skull through the vertex without intravenous contrast.  COMPARISON:  08/01/2014  FINDINGS: There is no intracranial hemorrhage, mass or evidence of acute infarction. There is moderate generalized atrophy. There is moderate chronic microvascular ischemic change. There is no significant extra-axial fluid collection.  No acute intracranial findings are evident.  IMPRESSION: No acute findings. There is moderate generalized atrophy and chronic small vessel disease.   Electronically Signed   By: Ellery Plunk M.D.   On: 11/09/2014 01:10   Dg Chest Port 1 View  11/09/2014   CLINICAL DATA:  Acute onset of encephalopathy. Patient unresponsive. Admission chest radiograph. Initial encounter.  EXAM: PORTABLE CHEST 1 VIEW  COMPARISON:  Chest radiograph performed 07/26/2014  FINDINGS: The lungs are well-aerated and clear. There is no evidence of focal opacification, pleural effusion or pneumothorax.  The cardiomediastinal silhouette is borderline normal in size. No acute osseous abnormalities are seen.  IMPRESSION: No acute cardiopulmonary process seen.   Electronically Signed   By: Roanna Raider M.D.   On: 11/09/2014 01:37    EKG: Independently reviewed. Normal sinus rhythm with LVH.  Assessment/Plan Principal Problem:   Acute encephalopathy Active Problems:   Generalized weakness   Altered mental status   1. Acute encephalopathy with muscle rigidity - patient is afebrile. UA is pending. ABGs unremarkable. CT head is unremarkable. I have consulted on-call neurologist Dr. Cyril Mourning for further recommendations. Continue with gentle hydration. AP  evaluation for swallow. 2. History of depression - presently unable to swallow so antidepressents on hold.  I have reviewed patient's old charts on labs. Personally reviewed chest x-ray and EKG. I have consulted neurology.   DVT Prophylaxis Lovenox.  Code Status: Full code.   Family Communication: Patient's daughter.  Disposition Plan: Admit to inpatient.    Susan Simmons N. [facility] Pager [phone].  If 7PM-7AM, please contact night-coverage [email or web address] Password [email or web address] 11/09/2014, 2:09 AM

## 2014-11-09 NOTE — Consult Note (Signed)
WOC wound consult note Reason for Consult: Sacral area pressure injury.  Also noted is a right arm and right ear pressure injury Wound type:Pressure/tauma (arm) Pressure Ulcer POA: Yes Measurement: Sacrum: 2cm x 3.5cm x 0.2cm Clean, pink denuded with scant serous moisture. Ear:  2cm x 0.4cm with dried serum obscuring wound bed (scab).  No exudate. Surrounding tissue erythematous.  Right arm at elbow:  2cm x 0.8cm x 0.2cm partial thickness with clean pink wound bed and scant serous exudate. Wound bed:As described above. Drainage (amount, consistency, odor) As described above Periwound: Normal except for as described above. Dressing procedure/placement/frequency: I have today provided this patient with bilateral pressure redistribution heel boots to prevent pressure ulceration.  Topical conservative care is ordered for right ear, right arm at elbow and sacral Stage 2 pressure injury.  Wound healing is complicated by intractable urinary incontinence, but turning and repositioning are already in place as is moisture barrier product use to the perineum. I have encouraged Nursing via the orders to avoid the supine position and to check the right ear and arm frequently when patient is positioning on the right side. WOC nursing team will not follow, but will remain available to this patient, the nursing and medical teams.  Please re-consult if needed. Thanks, Ladona Mow, MSN, RN, GNP, [place], CWON-AP [phone])

## 2014-11-09 NOTE — Progress Notes (Signed)
[facility] PROGRESS NOTE    Progress Note   Susan Simmons WUJ:[id number] DOB: August 16, 1934 DOA: 11/08/2014 PCP: [name], MD   Brief Narrative:   Susan Simmons is an 80 y.o. female with history of depression and Parkinson-like symptoms who was recently started restarted on Sinemet on Monday 11/01/2014 by patient's neurologist was found to have increasing somnolence and decreased appetite and also was found to have some regurgitation of food and patient is unable to eat well  Assessment/Plan:   Acute encephalopathy: - As per chart she does have Parkinson disease which will have some kind of rigidity. CT of the head was unremarkable, she doesn't appear to be infected. - Neurology was consulted awaiting recommendations. - I am unclear why she was confused, her confusion seems to have improved this morning, question of dizziness a degree of acute confusional state. As per patient none other medications of been.  Depression: Continue to hold antidepressant medication.  Generalized weakness - pt consult.  Pressure ulcer Sacral decubitus ulcer consult Wound Care.     DVT Prophylaxis - Lovenox ordered.  Family Communication: daughter Disposition Plan: Home when stable. Code Status:     Code Status Orders        Start     Ordered   11/09/14 0213  Full code   Continuous     11/09/14 0212        IV Access:    Peripheral IV   Procedures and diagnostic studies:   Ct Head Wo Contrast  11/09/2014   CLINICAL DATA:  New medications for Parkinson's disease. Now with decreased activity and decreased interpersonal interactions.  EXAM: CT HEAD WITHOUT CONTRAST  TECHNIQUE: Contiguous axial images were obtained from the base of the skull through the vertex without intravenous contrast.  COMPARISON:  08/01/2014  FINDINGS: There is no intracranial hemorrhage, mass or evidence of acute infarction. There is moderate generalized atrophy. There is moderate chronic  microvascular ischemic change. There is no significant extra-axial fluid collection.  No acute intracranial findings are evident.  IMPRESSION: No acute findings. There is moderate generalized atrophy and chronic small vessel disease.   Electronically Signed   By: Ellery Plunk M.D.   On: 11/09/2014 01:10   Dg Chest Port 1 View  11/09/2014   CLINICAL DATA:  Acute onset of encephalopathy. Patient unresponsive. Admission chest radiograph. Initial encounter.  EXAM: PORTABLE CHEST 1 VIEW  COMPARISON:  Chest radiograph performed 07/26/2014  FINDINGS: The lungs are well-aerated and clear. There is no evidence of focal opacification, pleural effusion or pneumothorax.  The cardiomediastinal silhouette is borderline normal in size. No acute osseous abnormalities are seen.  IMPRESSION: No acute cardiopulmonary process seen.   Electronically Signed   By: Roanna Raider M.D.   On: 11/09/2014 01:37     Medical Consultants:    None.  Anti-Infectives:   Anti-infectives    None      Subjective:    Susan Simmons no complaints she relates using [facility]. She doesn't recall why she is in the hospital. She relates none of her medications have been changed no new medications have been added.  Objective:    Filed Vitals:   11/09/14 0040 11/09/14 0100 11/09/14 0219 11/09/14 0439  BP: 104/39 126/37 128/47 117/41  Pulse:  85 82 72  Temp:   98.2 F (36.8 C) 98.1 F (36.7 C)  TempSrc:   Axillary Axillary  Resp: 17 17 18 17   Height:   5\' 4"  (1.626 m)   Weight:  52.028 kg (114 lb 11.2 oz)   SpO2: 100% 99% 98% 100%    Intake/Output Summary (Last 24 hours) at 11/09/14 0900 Last data filed at 11/09/14 0806  Gross per 24 hour  Intake    330 ml  Output    300 ml  Net     30 ml   Filed Weights   11/08/14 2056 11/09/14 0219  Weight: 53.524 kg (118 lb) 52.028 kg (114 lb 11.2 oz)    Exam: Gen:  NAD, awake alert and oriented 2 Cardiovascular:  RRR, No M/R/G Respiratory:   Lungs CTAB Gastrointestinal:  Abdomen soft, NT/ND, + BS Extremities:  No C/E/C   Data Reviewed:    Labs: Basic Metabolic Panel:  Recent Labs Lab 11/08/14 2108 11/09/14 0710  NA 137 138  K 4.0 3.8  CL 104 105  CO2 26 25  GLUCOSE 120* 94  BUN 17 16  CREATININE 0.74 0.69  CALCIUM 8.8* 8.4*   GFR Estimated Creatinine Clearance: 47.6 mL/min (by C-G formula based on Cr of 0.69). Liver Function Tests:  Recent Labs Lab 11/08/14 2108 11/09/14 0710  AST 42* 36  ALT 16 14  ALKPHOS 58 50  BILITOT 0.3 0.4  PROT 6.6 5.9*  ALBUMIN 2.8* 2.5*   No results for input(s): LIPASE, AMYLASE in the last 168 hours.  Recent Labs Lab 11/09/14 0056  AMMONIA 11   Coagulation profile No results for input(s): INR, PROTIME in the last 168 hours.  CBC:  Recent Labs Lab 11/08/14 2108 11/09/14 0710  WBC 6.1 5.1  NEUTROABS 3.8 2.2  HGB 12.9 11.7*  HCT 39.8 36.7  MCV 88.2 88.2  PLT 265 255   Cardiac Enzymes: No results for input(s): CKTOTAL, CKMB, CKMBINDEX, TROPONINI in the last 168 hours. BNP (last 3 results) No results for input(s): PROBNP in the last 8760 hours. CBG: No results for input(s): GLUCAP in the last 168 hours. D-Dimer: No results for input(s): DDIMER in the last 72 hours. Hgb A1c: No results for input(s): HGBA1C in the last 72 hours. Lipid Profile: No results for input(s): CHOL, HDL, LDLCALC, TRIG, CHOLHDL, LDLDIRECT in the last 72 hours. Thyroid function studies: No results for input(s): TSH, T4TOTAL, T3FREE, THYROIDAB in the last 72 hours.  Invalid input(s): FREET3 Anemia work up: No results for input(s): VITAMINB12, FOLATE, FERRITIN, TIBC, IRON, RETICCTPCT in the last 72 hours. Sepsis Labs:  Recent Labs Lab 11/08/14 2108 11/09/14 0710  WBC 6.1 5.1   Microbiology Recent Results (from the past 240 hour(s))  MRSA PCR Screening     Status: Abnormal   Collection Time: 11/09/14  2:53 AM  Result Value Ref Range Status   MRSA by PCR POSITIVE (A)  NEGATIVE Final    Comment:        The GeneXpert MRSA Assay (FDA approved for NASAL specimens only), is one component of a comprehensive MRSA colonization surveillance program. It is not intended to diagnose MRSA infection nor to guide or monitor treatment for MRSA infections. RESULT CALLED TO, READ BACK BY AND VERIFIED WITH: LAMB,S/4W  ON 11/09/14 BY KARCZEWSKI,S.      Medications:   . enoxaparin (LOVENOX) injection  40 mg Subcutaneous Q24H   Continuous Infusions: . sodium chloride 75 mL/hr at 11/09/14 0236    Time spent: 25 min   LOS: 0 days   Marinda Elk  [facility] Pager [phone]  *Please refer to [email or web address], password [email or web address] to get updated schedule on who will round on this patient, as hospitalists switch teams weekly.  If 7PM-7AM, please contact night-coverage at [email or web address], password [email or web address] for any overnight needs.  11/09/2014, 9:00 AM

## 2014-11-10 DIAGNOSIS — E440 Moderate protein-calorie malnutrition: Secondary | ICD-10-CM

## 2014-11-10 DIAGNOSIS — R259 Unspecified abnormal involuntary movements: Secondary | ICD-10-CM

## 2014-11-10 DIAGNOSIS — L8990 Pressure ulcer of unspecified site, unspecified stage: Secondary | ICD-10-CM

## 2014-11-10 DIAGNOSIS — R531 Weakness: Secondary | ICD-10-CM

## 2014-11-10 DIAGNOSIS — R4182 Altered mental status, unspecified: Secondary | ICD-10-CM

## 2014-11-10 DIAGNOSIS — G20 Parkinson's disease: Secondary | ICD-10-CM

## 2014-11-10 DIAGNOSIS — G9340 Encephalopathy, unspecified: Secondary | ICD-10-CM

## 2014-11-10 MED ORDER — MUPIROCIN CALCIUM 2 % EX CREA: TOPICAL_CREAM | Freq: Two times a day (BID) | CUTANEOUS | Status: DC

## 2014-11-10 MED ORDER — CHLORHEXIDINE GLUCONATE CLOTH 2 % EX PADS
6.0000 | MEDICATED_PAD | Freq: Every day | CUTANEOUS | Status: DC
  Administered 2014-11-10 – 2014-11-11 (×2): 6 via TOPICAL

## 2014-11-10 MED ORDER — ENSURE ENLIVE PO LIQD
237.0000 mL | Freq: Two times a day (BID) | ORAL | Status: DC
  Administered 2014-11-10 – 2014-11-11 (×3): 237 mL via ORAL

## 2014-11-10 MED ORDER — MUPIROCIN 2 % EX OINT
1.0000 "application " | TOPICAL_OINTMENT | Freq: Two times a day (BID) | CUTANEOUS | Status: DC
  Administered 2014-11-10 – 2014-11-11 (×3): 1 via NASAL
  Filled 2014-11-10: qty 22

## 2014-11-10 NOTE — Progress Notes (Signed)
Speech Language Pathology Treatment: Dysphagia  Patient Details Name: Susan Simmons MRN: [id number] DOB: [birth year] Today's Date: 11/10/2014 Time: 1201-1215 SLP Time Calculation (min) (ACUTE ONLY): 14 min  Assessment / Plan / Recommendation Clinical Impression  Pt with much improved mental status today however RN reports she only accepted a few bites of breakfast.  SLP assisted pt to consume applesauce sweetened with brown sugar, graham crackers and water. Mildly prolonged mastication noted but no residuals apparent.   No s/s of aspiration with all po observed.  Suspect pt's swallow ability is near baseline and she remains appropriate for a regular/thin diet. Pt will need assist to eat due to her weakness.  Recommend dietician consult to maximize caloric intake.   No follow up re: swallowing indicated as all education completed and pt tolerating intake.       HPI Other Pertinent Information: 80 yo female adm to [facility] with lethargy, AMS after starting Sinemet - diagnosed with acute encephalopathy.  PMH + for Parkinson's-like disease.  Swallow eval ordered for 10/4 and speech evaluation ordered for 10/5.  Pt CXR and CT head both negative.    Pertinent Vitals Pain Assessment: No/denies pain  SLP Plan  All goals met    Recommendations Diet recommendations: Regular;Thin liquid Liquids provided via: Cup;Straw Medication Administration: Whole meds with liquid (or with puree if poorly tolerated) Supervision: Staff to assist with self feeding Compensations: Slow rate;Small sips/bites;Check for pocketing Postural Changes and/or Swallow Maneuvers: Upright 30-60 min after meal;Seated upright 90 degrees              Oral Care Recommendations: Oral care BID Follow up Recommendations: None Plan: All goals met    [place], [place] [facility] SLP [phone]

## 2014-11-10 NOTE — Evaluation (Signed)
Speech Language Pathology Evaluation Patient Details Name: Susan Simmons MRN: [id number] DOB: 19-Sep-1934 Today's Date: 11/10/2014 Time: 2956-2130 SLP Time Calculation (min) (ACUTE ONLY): 16 min  Problem List:  Patient Active Problem List   Diagnosis Date Noted  . Pressure ulcer 11/09/2014  . Altered mental status 11/08/2014  . Parkinsonism (HCC) 11/01/2014  . E-coli UTI   . Acute encephalopathy 08/02/2014  . Fall 08/02/2014  . Generalized weakness 08/02/2014  . UTI (lower urinary tract infection) 08/01/2014  . Depression 05/02/2014   Past Medical History:  Past Medical History  Diagnosis Date  . Anxiety   . High cholesterol   . Depression    Past Surgical History:  Past Surgical History  Procedure Laterality Date  . Denies surgical history     HPI:  80 yo female adm to [facility] with lethargy, AMS after starting Sinemet - diagnosed with acute encephalopathy.  PMH + for Parkinson's-like disease.  Swallow eval ordered for 10/4 and speech evaluation ordered for 10/5.  Pt CXR and CT head both negative.    Assessment / Plan / Recommendation Clinical Impression  Pt presents with symptoms of significant cognitive deficits characterized by compromised attention, orientation, problem solving, awareness and memory.  She was oriented to self and location but did not recall reasoning for hospital stay nor date.  Pt did recall need to push call button for assistance with mod I after 1 minute of time.    Given pt extensive depression hx, SLP uncertain to depression impact on pt's cognitive function nor baseline status.  Pt did recall her daughter's name and stated she stays with her daughter, but she states she plans to go to [facility] when she leaves hospital.   NO family present to confirm information.  Pt will benefit from  skilled SLP to establish baseline function to determine intervention indication. Pt tearful x2 during session, expressing gratitude for whomever brought her to the  hospital and for her medical care.  Established basic cognitive goals using teach back with pt.      SLP Assessment  Patient needs continued Speech Lanaguage Pathology Services    Follow Up Recommendations  Other (comment) (TBD)    Frequency and Duration min 2x/week  1 week   Pertinent Vitals/Pain Pain Assessment: No/denies pain   SLP Goals  Progression toward goals: Progressing toward goals Patient/Family Stated Goal: none stated, "I'm so thankful to whoever brought me in" Potential to Achieve Goals (ACUTE ONLY): Fair Potential Considerations (ACUTE ONLY): Ability to learn/carryover information;Co-morbidities;Previous level of function (? prior level of function)  SLP Evaluation Prior Functioning  Cognitive/Linguistic Baseline: Information not available (no family present) Education: pt reports she lives with her daughter   Cognition  Overall Cognitive Status: No family/caregiver present to determine baseline cognitive functioning Arousal/Alertness: Awake/alert Orientation Level: Oriented to person;Oriented to place;Disoriented to time;Disoriented to situation (pt stated year 2014) Attention: Sustained;Selective Sustained Attention: Impaired Sustained Attention Impairment: Verbal basic Selective Attention: Impaired Memory: Impaired Memory Impairment: Storage deficit;Retrieval deficit;Decreased recall of new information;Decreased long term memory Awareness: Impaired Awareness Impairment: Intellectual impairment (pt reports she can get up out of bed on her own, decreased awareness to fall risk) Problem Solving: Impaired Problem Solving Impairment: Functional basic;Verbal basic (use of call bell to summon assist not verbalized or motorically completed) Behaviors: Lability (pt tearful x2 during session) Safety/Judgment: Impaired    Comprehension  Auditory Comprehension Yes/No Questions: Within Functional Limits Commands: Within Functional Limits (for one step commands,  decreased attention) Interfering Components: Attention;Processing speed;Working [employer] fast streamer;Anxiety;Visual impairments EffectiveTechniques:  Extra processing time;Repetition Visual Recognition/Discrimination Discrimination: Not tested Reading Comprehension Reading Status: Impaired (pt stated she could not read her swallow precaution sign as she has glasses that are not available)    Expression Expression Primary Mode of Expression: Verbal Verbal Expression Overall Verbal Expression: Appears within functional limits for tasks assessed Initiation: No impairment Level of Generative/Spontaneous Verbalization: Conversation Repetition:  (DNT) Naming: No impairment Interfering Components: Attention Written Expression Dominant Hand: Left Written Expression: Not tested   Oral / Motor Oral Motor/Sensory Function Overall Oral Motor/Sensory Function:  (generalized weakness, no focal cn deficits) Motor Speech Overall Motor Speech: Other (comment) (baseline unknown) Phonation: Low vocal intensity Resonance: Within functional limits Articulation: Within functional limitis Intelligibility: Intelligible Motor Planning: Witnin functional limits   GO     Chales Abrahams 11/10/2014, 12:45 PM

## 2014-11-10 NOTE — Clinical Documentation Improvement (Signed)
Hospitalist   Malnutrition (Severe(third degree), Moderate (second degree), Mild (first degree)  Other condition  Unable to clinically determine  Document any associated diagnoses/conditions  Supporting Information: Initial Nutrition Assessment by Renie Ora, RD at 11-21-2014 12:07 PM DOCUMENTATION CODES:  Non-severe (moderate) malnutrition in context of acute illness/injury   INTERVENTION:  - Will order Ensure Enlive BID, each supplement provides 350 kcal and 20 grams of protein  - Meal set up and feeding assistance as needed  - RD will continue to monitor for needs   NUTRITION DIAGNOSIS:  Inadequate oral intake related to acute illness, lethargy/confusion as evidenced by meal completion < 25%.   Please exercise your independent, professional judgment when responding. A specific answer is not anticipated or expected.  Thank You, Nevin Bloodgood, RN, BSN, CCDS,Clinical Documentation Specialist:  [phone]  [phone]=[facility]- Health Information Management  [phone]

## 2014-11-10 NOTE — Progress Notes (Signed)
[facility] PROGRESS NOTE    Progress Note   [name] UJW:[id number] DOB: 1934/10/08 DOA: 11/08/2014 PCP: [name], MD   Brief Narrative:   Susan Simmons is an 80 y.o. female with history of depression and Parkinson-like symptoms who was recently started restarted on Sinemet on Monday 11/01/2014 by patient's neurologist was found to have increasing somnolence and decreased appetite and also was found to have some regurgitation of food and patient is unable to eat well  Assessment/Plan:  Acute encephalopathy: - As per chart she does have Parkinson disease and has recently have increase dose on her sinemet.  -after discussing with neurology and following recommendations her sinemet dose has been readjusted and patient is no longer confused and her degree of stiffness is less appreciated -will follow further rec's -continue constant reorientation -will minimize use of narcotics and anxiolytics   Depression: Continue to hold remeron.  Generalized weakness - PT/OTconsulted; recommending SNF -will alert SW for placement assistance for rehabilitation .  Sacral decubitus Pressure ulcer (stage 2) -Wound Care consulted -Will follow rec's -continue barrier creams and preventive measures.  Moderate protein calorie Malnutrition -will use ensure BID and will encourage PO intake Nutritional service consulted      DVT Prophylaxis - Lovenox ordered.  Family Communication: no family at bedside Disposition Plan: Home when stable.      Code Status Orders        Start     Ordered   11/09/14 0213  Full code   Continuous     11/09/14 0212      IV Access:    Peripheral IV   Procedures and diagnostic studies:   Ct Head Wo Contrast  11/09/2014   CLINICAL DATA:  New medications for Parkinson's disease. Now with decreased activity and decreased interpersonal interactions.  EXAM: CT HEAD WITHOUT CONTRAST  TECHNIQUE: Contiguous axial images were obtained  from the base of the skull through the vertex without intravenous contrast.  COMPARISON:  08/01/2014  FINDINGS: There is no intracranial hemorrhage, mass or evidence of acute infarction. There is moderate generalized atrophy. There is moderate chronic microvascular ischemic change. There is no significant extra-axial fluid collection.  No acute intracranial findings are evident.  IMPRESSION: No acute findings. There is moderate generalized atrophy and chronic small vessel disease.   Electronically Signed   By: Ellery Plunk M.D.   On: 11/09/2014 01:10   Dg Chest Port 1 View  11/09/2014   CLINICAL DATA:  Acute onset of encephalopathy. Patient unresponsive. Admission chest radiograph. Initial encounter.  EXAM: PORTABLE CHEST 1 VIEW  COMPARISON:  Chest radiograph performed 07/26/2014  FINDINGS: The lungs are well-aerated and clear. There is no evidence of focal opacification, pleural effusion or pneumothorax.  The cardiomediastinal silhouette is borderline normal in size. No acute osseous abnormalities are seen.  IMPRESSION: No acute cardiopulmonary process seen.   Electronically Signed   By: Roanna Raider M.D.   On: 11/09/2014 01:37     Medical Consultants:    None.  Anti-Infectives:   Anti-infectives    None      Subjective:   Denies CP, SOB, abd pain, nausea or vomiting. Per nursing staff eating more and with minimal assistance. Patient participate with PT/OT and required max assistance for ambulation/change in position. AAOX3 and with less stiffness    Objective:    Filed Vitals:   11/09/14 1326 11/09/14 2100 11/10/14 0416 11/10/14 1500  BP: 126/48 131/54 144/49 124/43  Pulse: 85 86 81 91  Temp: 97.4 F (36.3 C)  98.6 F (37 C) 99.2 F (37.3 C) 97.8 F (36.6 C)  TempSrc: Oral Oral Axillary Oral  Resp: Height:      Weight:      SpO2: 100% 99% 93% 92%    Intake/Output Summary (Last 24 hours) at 11/10/14 1826 Last data filed at 11/10/14 1500  Gross per 24  hour  Intake    675 ml  Output    575 ml  Net    100 ml   Filed Weights   11/08/14 2056 11/09/14 0219  Weight: 53.524 kg (118 lb) 52.028 kg (114 lb 11.2 oz)    Exam: Gen:  NAD, awake alert and oriented 3; patient is afebrile and feeling better. Denies CP and SOB. Frail and chronically ill in appearance  Cardiovascular:  RRR, No M/R/G Respiratory:  Lungs CTAB Gastrointestinal:  Abdomen soft, NT/ND, + BS Extremities:  No C/E/C   Data Reviewed:    Labs: Basic Metabolic Panel:  Recent Labs Lab 11/08/14 2108 11/09/14 0710  NA 137 138  K 4.0 3.8  CL 104 105  CO2 26 25  GLUCOSE 120* 94  BUN 17 16  CREATININE 0.74 0.69  CALCIUM 8.8* 8.4*   GFR Estimated Creatinine Clearance: 47.6 mL/min (by C-G formula based on Cr of 0.69).   Liver Function Tests:  Recent Labs Lab 11/08/14 2108 11/09/14 0710  AST 42* 36  ALT 16 14  ALKPHOS 58 50  BILITOT 0.3 0.4  PROT 6.6 5.9*  ALBUMIN 2.8* 2.5*    Recent Labs Lab 11/09/14 0056  AMMONIA 11   CBC:  Recent Labs Lab 11/08/14 2108 11/09/14 0710  WBC 6.1 5.1  NEUTROABS 3.8 2.2  HGB 12.9 11.7*  HCT 39.8 36.7  MCV 88.2 88.2  PLT 265 255   Sepsis Labs:  Recent Labs Lab 11/08/14 2108 11/09/14 0710  WBC 6.1 5.1   Microbiology Recent Results (from the past 240 hour(s))  MRSA PCR Screening     Status: Abnormal   Collection Time: 11/09/14  2:53 AM  Result Value Ref Range Status   MRSA by PCR POSITIVE (A) NEGATIVE Final    Comment:        The GeneXpert MRSA Assay (FDA approved for NASAL specimens only), is one component of a comprehensive MRSA colonization surveillance program. It is not intended to diagnose MRSA infection nor to guide or monitor treatment for MRSA infections. RESULT CALLED TO, READ BACK BY AND VERIFIED WITH: LAMB,S/4W  ON 11/09/14 BY KARCZEWSKI,S.      Medications:   . carbidopa-levodopa  1 tablet Oral BID  . Chlorhexidine Gluconate Cloth  6 each Topical Q0600  . enoxaparin  (LOVENOX) injection  40 mg Subcutaneous Q24H  . feeding supplement (ENSURE ENLIVE)  237 mL Oral BID BM  . mupirocin ointment  1 application Nasal BID   Continuous Infusions:    Time spent: 25 min   LOS: 1 day   Vassie Loll  [facility] Pager [phone]  *Please refer to [email or web address], password [email or web address] to get updated schedule on who will round on this patient, as hospitalists switch teams weekly. If 7PM-7AM, please contact night-coverage at [email or web address], password [email or web address] for any overnight needs.  11/10/2014, 6:26 PM

## 2014-11-10 NOTE — Evaluation (Signed)
Occupational Therapy Evaluation Patient Details Name: Susan Simmons MRN: [id number] DOB: 02-Sep-1934 Today's Date: 11/10/2014    History of Present Illness Susan Simmons is a 80 y.o. female with history of depression and Parkinson-like symptoms who was recently started restarted on Sinemet on Monday 11/01/2014 by patient's neurologist was found to have increasing somnolence and decreased appetite and also was found to have some regurgitation of food and patient is unable to eat well.   Clinical Impression   Pt admitted with above. She demonstrates the below listed deficits and will benefit from continued OT to maximize safety and independence with BADLs.  Pt requires mod - total A for ADLs.  Recommend SNF level rehab at discharge. Will follow acutely.     Follow Up Recommendations  SNF;Supervision/Assistance - 24 hour    Equipment Recommendations  3 in 1 bedside comode;Wheelchair (measurements OT);Hospital bed    Recommendations for Other Services       Precautions / Restrictions Precautions Precautions: Fall Precaution Comments: daughter reports h/o 2 falls in past year Restrictions Weight Bearing Restrictions: No      Mobility Bed Mobility Overal bed mobility: Needs Assistance Bed Mobility: Rolling;Sidelying to Sit Rolling: Max assist Sidelying to sit: Max assist       General bed mobility comments: Pt sitting in recliner   Transfers Overall transfer level: Needs assistance Equipment used: 1 person hand held assist Transfers: Sit to/from Stand Sit to Stand: Mod assist Stand pivot transfers: Max assist       General transfer comment: assist to rise (pt 60%) and steady, unable to come to full upright position, assist to pivot (pt 40%)    Balance Overall balance assessment: Needs assistance Sitting-balance support: Feet supported Sitting balance-Leahy Scale: Fair       Standing balance-Leahy Scale: Poor Standing balance comment: unable to achieve full  upright position, stands with hips/knees flexed                             ADL Overall ADL's : Needs assistance/impaired Eating/Feeding: Sitting;Minimal assistance Eating/Feeding Details (indicate cue type and reason): min A  with finger foods. Pt requires increased time to complete tasks. Pt fatigued with self feeding  Grooming: Wash/dry hands;Wash/dry face;Minimal assistance;Sitting   Upper Body Bathing: Maximal assistance;Sitting   Lower Body Bathing: Total assistance;Sit to/from stand   Upper Body Dressing : Total assistance;Sitting   Lower Body Dressing: Total assistance;Sit to/from stand   Toilet Transfer: Moderate assistance;Stand-pivot;BSC   Toileting- Clothing Manipulation and Hygiene: Moderate assistance;Sit to/from stand       Functional mobility during ADLs: Moderate assistance       Vision Additional Comments: Pt unable to participate in formal assessment    Perception     Praxis      Pertinent Vitals/Pain Pain Assessment: No/denies pain     Hand Dominance Left   Extremity/Trunk Assessment Upper Extremity Assessment Upper Extremity Assessment: RUE deficits/detail;LUE deficits/detail;Generalized weakness RUE Deficits / Details: Pt bradykinetic.  She demonstrates AROM bil. shoulders ~50* and AAROm ~70*.  Unable to fully assess if pt actively resisting movement greater than 70* or if ROM physiologically limted.   AROM elbows distally WFL, but rigidity noted  RUE Coordination: decreased fine motor;decreased gross motor LUE Deficits / Details: Pt bradykinetic.  She demonstrates AROM bil. shoulders ~50* and AAROm ~70*.  Unable to fully assess if pt actively resisting movement greater than 70* or if ROM physiologically limted.   AROM elbows distally WFL, but  rigidity noted  LUE Coordination: decreased fine motor;decreased gross motor   Lower Extremity Assessment Lower Extremity Assessment: Defer to PT evaluation RLE Deficits / Details: ankle DF PROM  -5*, knee ext -10*, cognition limits strength testing LLE Deficits / Details: ankle DF PROM -5*, knee ext -10*, cognition limits strength testing   Cervical / Trunk Assessment Cervical / Trunk Assessment: Other exceptions Cervical / Trunk Exceptions: decreased isolated trunk movements noted with trunk rigidity noted    Communication Communication Communication: Other (comment) (slow to respond)   Cognition Arousal/Alertness: Awake/alert Behavior During Therapy: Flat affect Overall Cognitive Status: No family/caregiver present to determine baseline cognitive functioning Area of Impairment: Attention;Memory;Following commands;Awareness;Problem solving;Safety/judgement   Current Attention Level: Sustained Memory: Decreased short-term memory Following Commands: Follows one step commands inconsistently;Follows one step commands with increased time Safety/Judgement: Decreased awareness of safety   Problem Solving: Decreased initiation;Slow processing;Difficulty sequencing;Requires verbal cues;Requires tactile cues     General Comments       Exercises       Shoulder Instructions      Home Living Family/patient expects to be discharged to:: Private residence Living Arrangements: Children Available Help at Discharge: Family;Available 24 hours/day Type of Home: House Home Access: Stairs to enter [employer] of Steps: 3 Entrance Stairs-Rails: None Home Layout: One level               Home Equipment: Environmental consultant - 2 wheels   Additional Comments: Daughter not present during OT eval to provide info re: bathroom set up and pt unable to provide accurate information       Prior Functioning/Environment Level of Independence: Needs assistance  Gait / Transfers Assistance Needed: hasn't walked in about 2 weeks, daughter has been lifting pt OOB, prior to that pt walked but dragged her feet ADL's / Homemaking Assistance Needed: total assist from daughter for past 2 weeks         OT Diagnosis: Generalized weakness;Cognitive deficits   OT Problem List: Decreased strength;Decreased range of motion;Decreased activity tolerance;Impaired balance (sitting and/or standing);Decreased coordination;Decreased safety awareness;Decreased cognition;Decreased knowledge of use of DME or AE;Impaired tone;Impaired UE functional use   OT Treatment/Interventions: Self-care/ADL training;Neuromuscular education;DME and/or AE instruction;Therapeutic activities;Cognitive remediation/compensation;Patient/family education;Balance training    OT Goals(Current goals can be found in the care plan section) Acute Rehab OT Goals OT Goal Formulation: Patient unable to participate in goal setting Time For Goal Achievement: 11/24/14 Potential to Achieve Goals: Good ADL Goals Pt Will Perform Grooming: with supervision;sitting Pt Will Perform Upper Body Bathing: with min assist;sitting Pt Will Perform Lower Body Bathing: with mod assist;sit to/from stand Pt Will Transfer to Toilet: with min assist;stand pivot transfer;bedside commode  OT Frequency: Min 2X/week   Barriers to D/C: Decreased caregiver support          Co-evaluation              End of Session Nurse Communication: Mobility status  Activity Tolerance: Patient tolerated treatment well Patient left: in chair;with call bell/phone within reach;with chair alarm set   Time: 1346-1410 OT Time Calculation (min): 24 min Charges:  OT General Charges $OT Visit: 1 Procedure OT Evaluation $Initial OT Evaluation Tier I: 1 Procedure OT Treatments $Self Care/Home Management : 8-22 mins G-Codes:    Gwendolin Briel M 2014/12/10, 2:14 PM

## 2014-11-10 NOTE — Evaluation (Addendum)
Physical Therapy Evaluation Patient Details Name: Susan Simmons MRN: [id number] DOB: [birth year] Today's Date: 11/10/2014   History of Present Illness  Susan Simmons is a 80 y.o. female with history of depression and Parkinson-like symptoms who was recently started restarted on Sinemet on Monday 11/01/2014 by patient's neurologist was found to have increasing somnolence and decreased appetite and also was found to have some regurgitation of food and patient is unable to eat well.  Clinical Impression  Pt admitted with above diagnosis. Pt currently with functional limitations due to the deficits listed below (see PT Problem List). Mod to max assist for OOB to recliner. Per pt's daughter pt has had significant decline in mobility over the past 2 weeks. She'd been able to walk previously, however for last 2 weeks is requiring total assist for ADLs and to get OOB. Pt has decreased strength, ROM, balance and would benefit from ST-SNF for rehab.  Pt will benefit from skilled PT to increase their independence and safety with mobility to allow discharge to the venue listed below.       Follow Up Recommendations SNF    Equipment Recommendations  3in1 (PT);Wheelchair (measurements PT)    Recommendations for Other Services OT consult     Precautions / Restrictions Precautions Precautions: Fall Precaution Comments: daughter reports h/o 2 falls in past year Restrictions Weight Bearing Restrictions: No      Mobility  Bed Mobility Overal bed mobility: Needs Assistance Bed Mobility: Rolling;Sidelying to Sit Rolling: Max assist Sidelying to sit: Max assist       General bed mobility comments: manual and verbal cues for technique, assist to initiate roll and to raise trunk/advance BLEs to EOB, pt 30%  Transfers Overall transfer level: Needs assistance   Transfers: Sit to/from Stand;Stand Pivot Transfers Sit to Stand: Mod assist Stand pivot transfers: Max assist       General transfer  comment: assist to rise (pt 60%) and steady, unable to come to full upright position, assist to pivot (pt 40%)  Ambulation/Gait             General Gait Details: unable  Stairs            Wheelchair Mobility    Modified Rankin (Stroke Patients Only)       Balance Overall balance assessment: Needs assistance Sitting-balance support: Feet supported;Single extremity supported Sitting balance-Leahy Scale: Fair       Standing balance-Leahy Scale: Poor Standing balance comment: unable to achieve full upright position, stands with hips/knees flexed                              Pertinent Vitals/Pain Pain Assessment: No/denies pain    Home Living Family/patient expects to be discharged to:: Private residence Living Arrangements: Children Available Help at Discharge: Family;Available 24 hours/day Type of Home: House Home Access: Stairs to enter Entrance Stairs-Rails: None Entrance Stairs-Number of Steps: 3 Home Layout: One level Home Equipment: Walker - 2 wheels      Prior Function Level of Independence: Needs assistance   Gait / Transfers Assistance Needed: hasn't walked in about 2 weeks, daughter has been lifting pt OOB, prior to that pt walked but dragged her feet  ADL's / Homemaking Assistance Needed: total assist from daughter for past 2 weeks        Hand Dominance   Dominant Hand: Left    Extremity/Trunk Assessment   Upper Extremity Assessment: Defer to OT evaluation (B shoulder AAROM approx.  60*, pt reported pain in L shoulder with elevation, grip 3/5 R, -4/5 L)           Lower Extremity Assessment: RLE deficits/detail;LLE deficits/detail RLE Deficits / Details: ankle DF PROM -5*, knee ext -10*, cognition limits strength testing LLE Deficits / Details: ankle DF PROM -5*, knee ext -10*, cognition limits strength testing  Cervical / Trunk Assessment: Normal  Communication   Communication: No difficulties  Cognition  Arousal/Alertness: Awake/alert Behavior During Therapy: Flat affect Overall Cognitive Status: Impaired/Different from baseline Area of Impairment: Memory;Following commands;Problem solving     Memory: Decreased short-term memory Following Commands: Follows one step commands inconsistently     Problem Solving: Slow processing      General Comments      Exercises        Assessment/Plan    PT Assessment Patient needs continued PT services  PT Diagnosis Difficulty walking;Generalized weakness   PT Problem List Decreased strength;Decreased range of motion;Decreased activity tolerance;Decreased balance;Decreased cognition;Decreased mobility  PT Treatment Interventions DME instruction;Gait training;Functional mobility training;Therapeutic activities;Patient/family education;Therapeutic exercise;Balance training   PT Goals (Current goals can be found in the Care Plan section) Acute Rehab PT Goals Patient Stated Goal: to get stronger (daughter's goal) PT Goal Formulation: With family Time For Goal Achievement: 11/24/14 Potential to Achieve Goals: Fair    Frequency Min 3X/week   Barriers to discharge        Co-evaluation               End of Session Equipment Utilized During Treatment: Gait belt Activity Tolerance: Patient tolerated treatment well;No increased pain Patient left: in chair;with call bell/phone within reach;with family/visitor present;with chair alarm set Nurse Communication: Mobility status         Time: 1308-6578 PT Time Calculation (min) (ACUTE ONLY): 31 min   Charges:   PT Evaluation $Initial PT Evaluation Tier I: 1 Procedure PT Treatments $Therapeutic Activity: 8-22 mins   PT G Codes:        Tamala Ser 11/10/2014, 1:31 PM [phone]

## 2014-11-10 NOTE — Progress Notes (Signed)
Subjective: Patient awake.  Reports no nausea or dizziness.  Worked with therapy today and required max to mod assist for all activities.  Unable to come to a full stand and therefore ambulation was not attempted.  Did eat with min assist.    Objective: Current vital signs: BP 124/43 mmHg  Pulse 91  Temp(Src) 97.8 F (36.6 C) (Oral)  Resp 18  Ht  (1.626 m)  Wt 52.028 kg (114 lb 11.2 oz)  BMI 19.68 kg/m2  SpO2 92% Vital signs in last 24 hours: Temp:  [97.8 F (36.6 C)-99.2 F (37.3 C)] 97.8 F (36.6 C) (10/05 1500) Pulse Rate:  [81-91] 91 (10/05 1500) Resp:  [18] 18 (10/05 1500) BP: (124-144)/(43-54) 124/43 mmHg (10/05 1500) SpO2:  [92 %-99 %] 92 % (10/05 1500)  Intake/Output from previous day: 10/04 0701 - 10/05 0700 In: 1320 [P.O.:120; I.V.:1200] Out: 625 [Urine:625] Intake/Output this shift: Total I/O In: 300 [P.O.:300] Out: 250 [Urine:250] Nutritional status: Diet regular Room service appropriate?: Yes; Fluid consistency:: Thin  Neurologic Exam: Mental Status: Alert, oriented to name and being in the hospital. Remains unable to tell me the year.  Speech fluent without evidence of aphasia. Able to follow simple commands without difficulty. Cranial Nerves: II: Discs flat bilaterally; Visual fields grossly normal, pupils equal, round, reactive to light and accommodation III,IV, VI: ptosis not present, extra-ocular motions intact bilaterally V,VII: smile symmetric, facial light touch sensation normal bilaterally VIII: hearing normal bilaterally IX,X: gag reflex present XI: bilateral shoulder shrug XII: midline tongue extension Motor: Patient moves all extremities against gravity but weakly. More movement noted than on yesterday in all extremities.  Tone improved.    Sensory: Pinprick and light touch intact throughout, bilaterally Deep Tendon Reflexes: 2+ in the upper extremities and absent in the lower extremities.    Lab Results: Basic Metabolic  Panel:  Recent Labs Lab 11/08/14 2108 11/09/14 0710  NA 137 138  K 4.0 3.8  CL 104 105  CO2 26 25  GLUCOSE 120* 94  BUN 17 16  CREATININE 0.74 0.69  CALCIUM 8.8* 8.4*    Liver Function Tests:  Recent Labs Lab 11/08/14 2108 11/09/14 0710  AST 42* 36  ALT 16 14  ALKPHOS 58 50  BILITOT 0.3 0.4  PROT 6.6 5.9*  ALBUMIN 2.8* 2.5*   No results for input(s): LIPASE, AMYLASE in the last 168 hours.  Recent Labs Lab 11/09/14 0056  AMMONIA 11    CBC:  Recent Labs Lab 11/08/14 2108 11/09/14 0710  WBC 6.1 5.1  NEUTROABS 3.8 2.2  HGB 12.9 11.7*  HCT 39.8 36.7  MCV 88.2 88.2  PLT 265 255    Cardiac Enzymes: No results for input(s): CKTOTAL, CKMB, CKMBINDEX, TROPONINI in the last 168 hours.  Lipid Panel: No results for input(s): CHOL, TRIG, HDL, CHOLHDL, VLDL, LDLCALC in the last 168 hours.  CBG: No results for input(s): GLUCAP in the last 168 hours.  Microbiology: Results for orders placed or performed during the hospital encounter of 11/08/14  MRSA PCR Screening     Status: Abnormal   Collection Time: 11/09/14  2:53 AM  Result Value Ref Range Status   MRSA by PCR POSITIVE (A) NEGATIVE Final    Comment:        The GeneXpert MRSA Assay (FDA approved for NASAL specimens only), is one component of a comprehensive MRSA colonization surveillance program. It is not intended to diagnose MRSA infection nor to guide or monitor treatment for MRSA infections. RESULT CALLED TO, READ  BACK BY AND VERIFIED WITH: LAMB,S/4W  ON 11/09/14 BY KARCZEWSKI,S.     Coagulation Studies: No results for input(s): LABPROT, INR in the last 72 hours.  Imaging: Ct Head Wo Contrast  11/09/2014   CLINICAL DATA:  New medications for Parkinson's disease. Now with decreased activity and decreased interpersonal interactions.  EXAM: CT HEAD WITHOUT CONTRAST  TECHNIQUE: Contiguous axial images were obtained from the base of the skull through the vertex without intravenous contrast.   COMPARISON:  08/01/2014  FINDINGS: There is no intracranial hemorrhage, mass or evidence of acute infarction. There is moderate generalized atrophy. There is moderate chronic microvascular ischemic change. There is no significant extra-axial fluid collection.  No acute intracranial findings are evident.  IMPRESSION: No acute findings. There is moderate generalized atrophy and chronic small vessel disease.   Electronically Signed   By: Ellery Plunk M.D.   On: 11/09/2014 01:10   Dg Chest Port 1 View  11/09/2014   CLINICAL DATA:  Acute onset of encephalopathy. Patient unresponsive. Admission chest radiograph. Initial encounter.  EXAM: PORTABLE CHEST 1 VIEW  COMPARISON:  Chest radiograph performed 07/26/2014  FINDINGS: The lungs are well-aerated and clear. There is no evidence of focal opacification, pleural effusion or pneumothorax.  The cardiomediastinal silhouette is borderline normal in size. No acute osseous abnormalities are seen.  IMPRESSION: No acute cardiopulmonary process seen.   Electronically Signed   By: Roanna Raider M.D.   On: 11/09/2014 01:37    Medications:  I have reviewed the patient's current medications. Scheduled: . carbidopa-levodopa  1 tablet Oral BID  . Chlorhexidine Gluconate Cloth  6 each Topical Q0600  . enoxaparin (LOVENOX) injection  40 mg Subcutaneous Q24H  . feeding supplement (ENSURE ENLIVE)  237 mL Oral BID BM  . mupirocin ointment  1 application Nasal BID    Assessment/Plan: Patient appears to be tolerating Sinemet and does appear to have had some benefit from the Sinemet.  PT and OT evaluation would have been when patient was at her worst-right before second dosing of Sinemet.    Recommendations: 1.  Would continue Sinemet at current dose before increasing to TID dosing.   2.  Continue PT and OT but would try to schedule for 60-90 minutes after dosing of Sinemet.   LOS: 1 day   Thana Farr, MD [facility] [phone] 11/10/2014  5:51  PM

## 2014-11-10 NOTE — Clinical Social Work Note (Signed)
Clinical Social Work Assessment  Patient Details  Name: Susan Simmons MRN: [id number] Date of Birth: February 17, 1934  Date of referral:  11/10/14               Reason for consult:  Discharge Planning                Permission sought to share information with:  Family Supports Permission granted to share information::  Yes, Verbal Permission Granted  Name::     [name]  Agency::     Relationship::  daughter  Contact Information:  [phone]  Housing/Transportation Living arrangements for the past 2 months:  Single Family Home Source of Information:  Adult Children Patient Interpreter Needed:  None Criminal Activity/Legal Involvement Pertinent to Current Situation/Hospitalization:  No - Comment as needed Significant Relationships:  Adult Children Lives with:  Adult Children Do you feel safe going back to the place where you live?  No Need for family participation in patient care:  Yes (Comment)  Care giving concerns:  Pt admitted from home with pt daughter. PT recommending SNF.    Social Worker assessment / plan:  CSW received referral in order to assist for New SNF.   CSW reviewed chart and attempted to meet with patient but patient sleeping very soundly and unable to participate in assessment. CSW called dtr [name]) and completed assessment with her.  Patient lives at home with dtr but has been weak and PT recommending SNF. Per pt daughter, Patient went to rehab in June at [facility] and pt daughter familiar with process. Pt daughter hopeful that pt can go back to [facility], but agreeable to [facility] search to have secondary options if needed.   CSW completed FL2 and faxed out.   CSW to follow up with pt daughter regarding SNF bed offers  CSW to continue to follow to provide support and assist with pt disposition needs.   Employment status:  Retired Database administrator PT Recommendations:  Skilled Nursing Facility Information / Referral to  community resources:  Skilled Nursing Facility  Patient/Family's Response to care:  Pt unable to participate in assessment. Pt daughter actively involved in pt care. Pt daughter agreeable to rehab at [facility].   Patient/Family's Understanding of and Emotional Response to Diagnosis, Current Treatment, and Prognosis:  Pt daughter expressed understanding about pt diagnosis and treatment plan.  Emotional Assessment Appearance:  Appears stated age Attitude/Demeanor/Rapport:  Unable to Assess Affect (typically observed):  Unable to Assess Orientation:  Oriented to Self, Oriented to Place Alcohol / Substance use:  Not Applicable Psych involvement (Current and /or in the community):  No (Comment)  Discharge Needs  Concerns to be addressed:  Discharge Planning Concerns Readmission within the last 30 days:  No Current discharge risk:  Physical Impairment Barriers to Discharge:  Continued Medical Work up   Orson Eva, LCSW 11/10/2014, 5:23 PM  [phone]

## 2014-11-10 NOTE — Clinical Social Work Placement (Signed)
   CLINICAL SOCIAL WORK PLACEMENT  NOTE  Date:  11/10/2014  Patient Details  Name: Susan Simmons MRN: [id number] Date of Birth: [birth year]  Clinical Social Work is seeking post-discharge placement for this patient at the Skilled  Nursing Facility level of care (*CSW will initial, date and re-position this form in  chart as items are completed):  Yes   Patient/family provided with [facility] Clinical Social Work Department's list of facilities offering this level of care within the geographic area requested by the patient (or if unable, by the patient's family).  Yes   Patient/family informed of their freedom to choose among providers that offer the needed level of care, that participate in Medicare, Medicaid or managed care program needed by the patient, have an available bed and are willing to accept the patient.  Yes   Patient/family informed of [facility]'s ownership interest in [facility] and [facility], as well as of the fact that they are under no obligation to receive care at these facilities.  PASRR submitted to EDS on       PASRR number received on       Existing PASRR number confirmed on 11/10/14     FL2 transmitted to all facilities in geographic area requested by pt/family on 11/10/14     FL2 transmitted to all facilities within larger geographic area on       Patient informed that his/her managed care company has contracts with or will negotiate with certain facilities, including the following:            Patient/family informed of bed offers received.  Patient chooses bed at       Physician recommends and patient chooses bed at      Patient to be transferred to   on  .  Patient to be transferred to facility by       Patient family notified on   of transfer.  Name of family member notified:        PHYSICIAN Please sign FL2, Please prepare priority discharge summary, including medications     Additional Comment:     _______________________________________________ Orson Eva, LCSW 11/10/2014, 5:29 PM

## 2014-11-10 NOTE — Progress Notes (Signed)
Initial Nutrition Assessment  DOCUMENTATION CODES:   Non-severe (moderate) malnutrition in context of acute illness/injury  INTERVENTION:  - Will order Ensure Enlive BID, each supplement provides 350 kcal and 20 grams of protein - Meal set up and feeding assistance as needed - RD will continue to monitor for needs  NUTRITION DIAGNOSIS:   Inadequate oral intake related to acute illness, lethargy/confusion as evidenced by meal completion < 25%.  GOAL:   Patient will meet greater than or equal to 90% of their needs  MONITOR:   PO intake, Supplement acceptance, Weight trends, Labs, Skin, I & O's  REASON FOR ASSESSMENT:   Malnutrition Screening Tool  ASSESSMENT:   80 y.o. female with history of depression and Parkinson-like symptoms who was recently started restarted on Sinemet on Monday 11/01/2014 by patient's neurologist was found to have increasing somnolence and decreased appetite and also was found to have some regurgitation of food and patient is unable to eat well. Patient was advised to come to the ER. In the ER patient was found to have muscle rigidity and appears confused. Patient is afebrile. UA still pending. Chest x-ray and CT head is unremarkable. Labs are largely unremarkable. As per patient's daughter patient has poor appetite and has been increasingly somnolent. When she wakes up in the morning some vomitus is found on the bed. Did not have any diarrhea.  Pt seen for MST. BMI indicates normal weight status. Pt with hx of dementia and is able to provide any information about PTA. When asked how her stomach is feeling this AM, pt repeatedly says "it feels" but does not complete thought. Breakfast tray in front of her appears mainly untouched. In huddle this AM RN reports that pt has only been eating bites of food. Per chart review, pt ate 0% breakfast and 60% dinner yesterday. Per H&P, daughter had reported decreased appetite and intakes recently.   Mild and moderate muscle  wasting noted to upper and lower body. Per chart review, pt has lost 9 lbs (7.3% body weight) in the past 2 months which is significant for time frame. Will order Ensure BID to supplement if pt is unable to consume solid foods well.  Not meeting needs. Medications reviewed. Labs reviewed; Ca: 8.4 mg/dL.   Diet Order:  Diet regular Room service appropriate?: Yes; Fluid consistency:: Thin  Skin:  Wound (see comment) (Stage 2 bilateral sacral pressure ulcers)  Last BM:  PTA  Height:   Ht Readings from Last 1 Encounters:  11/09/14  (1.626 m)    Weight:   Wt Readings from Last 1 Encounters:  11/09/14 114 lb 11.2 oz (52.028 kg)    Ideal Body Weight:  54.54 kg (kg)  BMI:  Body mass index is 19.68 kg/(m^2).  Estimated Nutritional Needs:   Kcal:  1300-1500  Protein:  50-60 grams  Fluid:  2 L/day  EDUCATION NEEDS:   No education needs identified at this time      Trenton Gammon, RD, LDN Inpatient Clinical Dietitian Pager # [phone] After hours/weekend pager # [phone]

## 2014-11-11 ENCOUNTER — Other Ambulatory Visit: Payer: Self-pay | Admitting: Neurology

## 2014-11-11 LAB — BASIC METABOLIC PANEL
Anion gap: 4 — ABNORMAL LOW (ref 5–15)
BUN: 9 mg/dL (ref 6–20)
CHLORIDE: 110 mmol/L (ref 101–111)
CO2: 26 mmol/L (ref 22–32)
CREATININE: 0.66 mg/dL (ref 0.44–1.00)
Calcium: 8.6 mg/dL — ABNORMAL LOW (ref 8.9–10.3)
GFR calc Af Amer: >60 mL/min (ref >60)
GFR calc non Af Amer: >60 mL/min (ref >60)
Glucose, Bld: 90 mg/dL (ref 65–99)
POTASSIUM: 3.7 mmol/L (ref 3.5–5.1)
Sodium: 140 mmol/L (ref 135–145)

## 2014-11-11 LAB — CBC
HEMATOCRIT: 32.5 % — AB (ref 36.0–46.0)
Hemoglobin: 10.5 g/dL — ABNORMAL LOW (ref 12.0–15.0)
MCH: 28.8 pg (ref 26.0–34.0)
MCHC: 32.3 g/dL (ref 30.0–36.0)
MCV: 89 fL (ref 78.0–100.0)
PLATELETS: 264 10*3/uL (ref 150–400)
RBC: 3.65 MIL/uL — AB (ref 3.87–5.11)
RDW: 12.7 % (ref 11.5–15.5)
WBC: 2.7 10*3/uL — AB (ref 4.0–10.5)

## 2014-11-11 MED ORDER — CARBIDOPA-LEVODOPA 25-100 MG PO TABS: 1.0000 | ORAL_TABLET | Freq: Two times a day (BID) | ORAL | Status: DC

## 2014-11-11 MED ORDER — ENSURE ENLIVE PO LIQD: 237.0000 mL | Freq: Two times a day (BID) | ORAL | Status: DC

## 2014-11-11 MED ORDER — BUSPIRONE HCL 5 MG PO TABS: 5.0000 mg | ORAL_TABLET | Freq: Three times a day (TID) | ORAL | Status: DC

## 2014-11-11 NOTE — Telephone Encounter (Signed)
Spoke to daughter, patient feeling better. Will see her when she gets out of the hospital.

## 2014-11-11 NOTE — Clinical Social Work Placement (Signed)
   CLINICAL SOCIAL WORK PLACEMENT  NOTE  Date:  11/11/2014  Patient Details  Name: Susan Simmons MRN: [id number] Date of Birth: October 19, 1934  Clinical Social Work is seeking post-discharge placement for this patient at the Skilled  Nursing Facility level of care (*CSW will initial, date and re-position this form in  chart as items are completed):  Yes   Patient/family provided with [place] Clinical Social Work Department's list of facilities offering this level of care within the geographic area requested by the patient (or if unable, by the patient's family).  Yes   Patient/family informed of their freedom to choose among providers that offer the needed level of care, that participate in Medicare, Medicaid or managed care program needed by the patient, have an available bed and are willing to accept the patient.  Yes   Patient/family informed of [place]'s ownership interest in [facility] and [facility], as well as of the fact that they are under no obligation to receive care at these facilities.  PASRR submitted to EDS on       PASRR number received on       Existing PASRR number confirmed on 11/10/14     FL2 transmitted to all facilities in geographic area requested by pt/family on 11/10/14     FL2 transmitted to all facilities within larger geographic area on       Patient informed that his/her managed care company has contracts with or will negotiate with certain facilities, including the following:        Yes   Patient/family informed of bed offers received.  Patient chooses bed at [facility]     Physician recommends and patient chooses bed at      Patient to be transferred to   on  .  Patient to be transferred to facility by       Patient family notified on   of transfer.  Name of family member notified:        PHYSICIAN Please sign FL2, Please prepare priority discharge summary, including medications     Additional Comment:     _______________________________________________ Orson Eva, LCSW 11/11/2014, 11:53 AM

## 2014-11-11 NOTE — Progress Notes (Signed)
Pt for discharge to [facility].  CSW facilitated pt discharge needs to [facility] including contacting facility, faxing pt discharge information via [facility], providing RN phone number to call report, attempted to discuss with pt, but pt sleeping at this time, discussing with pt daughter via telephone, and arranging ambulance transport for pt to [facility].  Pt daughter coping appropriately with transition to [facility]. Pt daughter relieved that [facility] could accept pt as pt is familiar with facility. Pt daughter requested RN notify pt daughter when transport arrives to transport pt in order for pt daughter to meet pt at SNF.  No further social work needs identified at this time.  CSW signing off.  Loletta Specter, MSW, LCSW Clinical Social Work [phone]

## 2014-11-11 NOTE — Clinical Social Work Placement (Signed)
   CLINICAL SOCIAL WORK PLACEMENT  NOTE  Date:  11/11/2014  Patient Details  Name: Susan Simmons MRN: [id number] Date of Birth: 26-Nov-1934  Clinical Social Work is seeking post-discharge placement for this patient at the Skilled  Nursing Facility level of care (*CSW will initial, date and re-position this form in  chart as items are completed):  Yes   Patient/family provided with [place] Clinical Social Work Department's list of facilities offering this level of care within the geographic area requested by the patient (or if unable, by the patient's family).  Yes   Patient/family informed of their freedom to choose among providers that offer the needed level of care, that participate in Medicare, Medicaid or managed care program needed by the patient, have an available bed and are willing to accept the patient.  Yes   Patient/family informed of [place]'s ownership interest in [facility] and [facility], as well as of the fact that they are under no obligation to receive care at these facilities.  PASRR submitted to EDS on       PASRR number received on       Existing PASRR number confirmed on 11/10/14     FL2 transmitted to all facilities in geographic area requested by pt/family on 11/10/14     FL2 transmitted to all facilities within larger geographic area on       Patient informed that his/her managed care company has contracts with or will negotiate with certain facilities, including the following:        Yes   Patient/family informed of bed offers received.  Patient chooses bed at [facility]     Physician recommends and patient chooses bed at      Patient to be transferred to [facility] on 11/11/14.  Patient to be transferred to facility by [facility]     Patient family notified on 11/11/14 of transfer.  Name of family member notified:  pt daughter, [name] notified via telephone     PHYSICIAN Please sign FL2, Please prepare  priority discharge summary, including medications     Additional Comment:    _______________________________________________ Orson Eva, LCSW 11/11/2014, 3:26 PM

## 2014-11-11 NOTE — Progress Notes (Signed)
CSW continuing to follow.   Per MD, anticipate discharge today.  CSW followed up with pt daughter, [name] via telephone regarding SNF bed offers.  Pt daughter chooses [facility] and [address]. CSW contacted [facility] and confirmed that facility can accept pt today.   Pt daughter states that she plans to meet pt at facility once pt discharged. Pt daughter appreciative of CSW assistance.  CSW to facilitate pt discharge needs when pt medically ready.  Loletta Specter, MSW, LCSW Clinical Social Work [phone]

## 2014-11-11 NOTE — Progress Notes (Signed)
Patient discharged to [facility], transported by EMS. Report given to Thurston Hole, RN at [facility], IVs and tele removed. Discharge packet given to EMS, patient stable at time of discharge.

## 2014-11-11 NOTE — Progress Notes (Addendum)
Subjective: Patient alert but complaining of pain.  Will not move extremities due to pain.  To go to SNF at discharge.    Objective: Current vital signs: BP 136/56 mmHg  Pulse 86  Temp(Src) 98.1 F (36.7 C) (Oral)  Resp 19  Ht  (1.626 m)  Wt 52.028 kg (114 lb 11.2 oz)  BMI 19.68 kg/m2  SpO2 99% Vital signs in last 24 hours: Temp:  [97.8 F (36.6 C)-98.4 F (36.9 C)] 98.1 F (36.7 C) (10/06 0610) Pulse Rate:  [86-95] 86 (10/06 0610) Resp:  [18-19] 19 (10/06 0610) BP: (124-138)/(43-56) 136/56 mmHg (10/06 0610) SpO2:  [92 %-99 %] 99 % (10/06 0610)  Intake/Output from previous day: 10/05 0701 - 10/06 0700 In: 540 [P.O.:540] Out: 550 [Urine:550] Intake/Output this shift: Total I/O In: 120 [P.O.:120] Out: -  Nutritional status: Diet regular Room service appropriate?: Yes; Fluid consistency:: Thin  Neurologic Exam: Mental Status: Alert, oriented to name and being in the hospital. Remains unable to tell me the year. Speech fluent without evidence of aphasia. Able to follow simple commands without difficulty. Cranial Nerves: II: Discs flat bilaterally; Visual fields grossly normal, pupils equal, round, reactive to light and accommodation III,IV, VI: ptosis not present, extra-ocular motions intact bilaterally V,VII: smile symmetric, facial light touch sensation normal bilaterally VIII: hearing normal bilaterally IX,X: gag reflex present XI: bilateral shoulder shrug XII: midline tongue extension Motor: Will not cooperate due to complaints of extremity pain.      Lab Results: Basic Metabolic Panel:  Recent Labs Lab 11/08/14 2108 11/09/14 0710 11/11/14 0502  NA 137 138 140  K 4.0 3.8 3.7  CL 104 105 110  CO2 GLUCOSE 120* 94 90  BUN CREATININE 0.74 0.69 0.66  CALCIUM 8.8* 8.4* 8.6*    Liver Function Tests:  Recent Labs Lab 11/08/14 2108 11/09/14 0710  AST 42* 36  ALT 16 14  ALKPHOS 58 50  BILITOT 0.3 0.4  PROT 6.6 5.9*   ALBUMIN 2.8* 2.5*   No results for input(s): LIPASE, AMYLASE in the last 168 hours.  Recent Labs Lab 11/09/14 0056  AMMONIA 11    CBC:  Recent Labs Lab 11/08/14 2108 11/09/14 0710 11/11/14 0502  WBC 6.1 5.1 2.7*  NEUTROABS 3.8 2.2  --   HGB 12.9 11.7* 10.5*  HCT 39.8 36.7 32.5*  MCV 88.2 88.2 89.0  PLT 265 255 264    Cardiac Enzymes: No results for input(s): CKTOTAL, CKMB, CKMBINDEX, TROPONINI in the last 168 hours.  Lipid Panel: No results for input(s): CHOL, TRIG, HDL, CHOLHDL, VLDL, LDLCALC in the last 168 hours.  CBG: No results for input(s): GLUCAP in the last 168 hours.  Microbiology: Results for orders placed or performed during the hospital encounter of 11/08/14  MRSA PCR Screening     Status: Abnormal   Collection Time: 11/09/14  2:53 AM  Result Value Ref Range Status   MRSA by PCR POSITIVE (A) NEGATIVE Final    Comment:        The GeneXpert MRSA Assay (FDA approved for NASAL specimens only), is one component of a comprehensive MRSA colonization surveillance program. It is not intended to diagnose MRSA infection nor to guide or monitor treatment for MRSA infections. RESULT CALLED TO, READ BACK BY AND VERIFIED WITH: LAMB,S/4W  ON 11/09/14 BY KARCZEWSKI,S.     Coagulation Studies: No results for input(s): LABPROT, INR in the last 72 hours.  Imaging: No results found.  Medications:  I  have reviewed the patient's current medications. Scheduled: . carbidopa-levodopa  1 tablet Oral BID  . Chlorhexidine Gluconate Cloth  6 each Topical Q0600  . enoxaparin (LOVENOX) injection  40 mg Subcutaneous Q24H  . feeding supplement (ENSURE ENLIVE)  237 mL Oral BID BM  . mupirocin ointment  1 application Nasal BID    Assessment/Plan: Tone appears improved but unable to tell if this translates into improved function due to timing of PT.  Uncomfortable placing patient on CR formulation of Sinemet at this time due to past experience.    Recommendations: 1.  Continue Sinemet 2.  Continue therapy.     LOS: 2 days   Patient stable neurologically at this time.  Neurology will follow up again on Monday after continued intervention from therapy.  If there are any questions in the interim, the [facility] as listed on Amion may be called.    Thana Farr, MD [facility] [phone] 11/11/2014  11:40 AM

## 2014-11-11 NOTE — Discharge Summary (Addendum)
Physician Discharge Summary  Susan Simmons ZOX:[id number] DOB: Jun 28, 1934 DOA: 11/08/2014  PCP: [name], MD  Admit date: 11/08/2014 Discharge date: 11/11/2014  Time spent: 40 minutes  Recommendations for Outpatient Follow-up:  1. Check BMET to follow electrolytes and renal function  2. Follow up with neurology as an outpatient (in 2-3 weeks), for further decisions/care of her parkinson disease  Discharge Diagnoses:  Principal Problem:   Acute encephalopathy Active Problems:   Generalized weakness   Altered mental status   Pressure ulcer   Malnutrition of moderate degree   Parkinson disease (HCC)   Discharge Condition: stable and improved. AAOX3. Patient will be discharge to SNF for rehabilitation. She is AAOX3 at discharge.  Diet recommendation: regular diet  Filed Weights   11/08/14 2056 11/09/14 0219  Weight: 53.524 kg (118 lb) 52.028 kg (114 lb 11.2 oz)    History of present illness:  80 y.o. female with history of depression and Parkinson-like symptoms who was recently started restarted on Sinemet on Monday 11/01/2014 by patient's neurologist was found to have increasing somnolence and decreased appetite and also was found to have some regurgitation of food and patient is unable to eat well. Patient was advised to come to the ER. In the ER patient was found to have muscle rigidity and appears confused. Patient is afebrile. UA still pending. Chest x-ray and CT head is unremarkable. Labs are largely unremarkable. As per patient's daughter patient has poor appetite and has been increasingly somnolent.  Hospital Course:  Acute encephalopathy: associated with medications - As per chart she does have Parkinson disease and has recently have increase dose on her sinemet.  -after discussing with neurology and following recommendations, her sinemet dose has been readjusted and patient is no longer confused and her degree of stiffness has improved as well -plan is to continue  current new dose of IR sinemet and outpatient follow up with neurology service -continue constant reorientation -will minimize use of anxiolytics  -due to increase weakness/deconditioning will discharge to SNF for rehabilitation  Depression/Anxiety: -Continue to hold remeron. -will resume buspar at lower dose TID  Generalized weakness/physical deconditioning  - PT/OTconsulted; recommending SNF -will discharge to SNF for rehabilitation   Sacral decubitus Pressure ulcer (stage 2) -Wound Care consulted -Will continue barrier creams and preventive measures -no signs of superimposed infection  Moderate protein calorie Malnutrition -will use ensure BID and will encourage PO intake -Nutritional service was consulted and we appreciated their rec's   Procedures:  See below for x-ray reports   Consultations:  Neurology   Discharge Exam: Filed Vitals:   11/11/14 1342  BP: 97/74  Pulse: 84  Temp: 97.4 F (36.3 C)  Resp: 20   Gen: NAD, awake alert and oriented 3; patient is afebrile and feeling better. Denies CP and SOB. Frail and chronically ill in appearance  Cardiovascular: RRR, No M/R/G Respiratory: Lungs CTAB Gastrointestinal: Abdomen soft, NT/ND, + BS Extremities: No C/E/C   Discharge Instructions   Discharge Instructions    Discharge instructions    Complete by:  As directed   Make sure patient is adequately hydrated Arrange follow up with Neurologist in 2 weeks after been discharge  Follow up with PCP in 2 weeks after been discharge from SNF Physical therapy and rehabilitation as pre SNF protocol          Current Discharge Medication List    START taking these medications   Details  carbidopa-levodopa (SINEMET IR) 25-100 MG tablet Take 1 tablet by mouth 2 (two) times daily.  feeding supplement, ENSURE ENLIVE, (ENSURE ENLIVE) LIQD Take 237 mLs by mouth 2 (two) times daily between meals. Qty: 237 mL, Refills: 12      CONTINUE these medications  which have CHANGED   Details  busPIRone (BUSPAR) 5 MG tablet Take 1 tablet (5 mg total) by mouth 3 (three) times daily.      CONTINUE these medications which have NOT CHANGED   Details  acetaminophen (TYLENOL) 325 MG tablet Take 2 tablets (650 mg total) by mouth every 6 (six) hours as needed for mild pain (or Fever >/= 101). Qty: 30 tablet, Refills: 0      STOP taking these medications     mirtazapine (REMERON) 15 MG tablet        No Known Allergies Follow-up Information    Follow up with [name], MD In 2 weeks.   Specialty:  Family Medicine   Why:  after discharge from SNF   Contact information:   [address] [place] [address] [phone]       The results of significant diagnostics from this hospitalization (including imaging, microbiology, ancillary and laboratory) are listed below for reference.    Significant Diagnostic Studies: Ct Head Wo Contrast  11/09/2014   CLINICAL DATA:  New medications for Parkinson's disease. Now with decreased activity and decreased interpersonal interactions.  EXAM: CT HEAD WITHOUT CONTRAST  TECHNIQUE: Contiguous axial images were obtained from the base of the skull through the vertex without intravenous contrast.  COMPARISON:  08/01/2014  FINDINGS: There is no intracranial hemorrhage, mass or evidence of acute infarction. There is moderate generalized atrophy. There is moderate chronic microvascular ischemic change. There is no significant extra-axial fluid collection.  No acute intracranial findings are evident.  IMPRESSION: No acute findings. There is moderate generalized atrophy and chronic small vessel disease.   Electronically Signed   By: Ellery Plunk M.D.   On: 11/09/2014 01:10   Dg Chest Port 1 View  11/09/2014   CLINICAL DATA:  Acute onset of encephalopathy. Patient unresponsive. Admission chest radiograph. Initial encounter.  EXAM: PORTABLE CHEST 1 VIEW  COMPARISON:  Chest radiograph performed  07/26/2014  FINDINGS: The lungs are well-aerated and clear. There is no evidence of focal opacification, pleural effusion or pneumothorax.  The cardiomediastinal silhouette is borderline normal in size. No acute osseous abnormalities are seen.  IMPRESSION: No acute cardiopulmonary process seen.   Electronically Signed   By: Roanna Raider M.D.   On: 11/09/2014 01:37    Microbiology: Recent Results (from the past 240 hour(s))  MRSA PCR Screening     Status: Abnormal   Collection Time: 11/09/14  2:53 AM  Result Value Ref Range Status   MRSA by PCR POSITIVE (A) NEGATIVE Final    Comment:        The GeneXpert MRSA Assay (FDA approved for NASAL specimens only), is one component of a comprehensive MRSA colonization surveillance program. It is not intended to diagnose MRSA infection nor to guide or monitor treatment for MRSA infections. RESULT CALLED TO, READ BACK BY AND VERIFIED WITH: LAMB,S/4W @0422  ON 11/09/14 BY KARCZEWSKI,S.      Labs: Basic Metabolic Panel:  Recent Labs Lab 11/08/14 2108 11/09/14 0710 11/11/14 0502  NA 137 138 140  K 4.0 3.8 3.7  CL 104 105 110  CO2 26 25 26   GLUCOSE 120* 94 90  BUN 17 16 9   CREATININE 0.74 0.69 0.66  CALCIUM 8.8* 8.4* 8.6*   Liver Function Tests:  Recent Labs Lab 11/08/14  2108 11/09/14 0710  AST 42* 36  ALT 16 14  ALKPHOS 58 50  BILITOT 0.3 0.4  PROT 6.6 5.9*  ALBUMIN 2.8* 2.5*    Recent Labs Lab 11/09/14 0056  AMMONIA 11   CBC:  Recent Labs Lab 11/08/14 2108 11/09/14 0710 11/11/14 0502  WBC 6.1 5.1 2.7*  NEUTROABS 3.8 2.2  --   HGB 12.9 11.7* 10.5*  HCT 39.8 36.7 32.5*  MCV 88.2 88.2 89.0  PLT 265 255 264    Signed:  Vassie Loll  [facility] 11/11/2014, 1:59 PM

## 2014-11-11 NOTE — Care Management Note (Signed)
Case Management Note  Patient Details  Name: Susan Simmons MRN: [id number] Date of Birth: 1934/02/17  Subjective/Objective:    80 y.o. F admitted with complications associated with Increased doses Sinemet related to Hx Parkinsonism.  CSW is working on SNF placement at [facility] per family choice.                Action/Plan:Will follow for final disposition.    Expected Discharge Date:   (unknown)               Expected Discharge Plan:  Skilled Nursing Facility  In-House Referral:  Clinical Social Work  Discharge planning Services  CM Consult  Post Acute Care Choice:    Choice offered to:     DME Arranged:    DME Agency:     HH Arranged:    HH Agency:     Status of Service:  In process, will continue to follow  Medicare Important Message Given:    Date Medicare IM Given:    Medicare IM give by:    Date Additional Medicare IM Given:    Additional Medicare Important Message give by:     If discussed at Long Length of Stay Meetings, dates discussed:    Additional Comments:  Yvone Neu, RN 11/11/2014, 3:51 PM

## 2014-11-12 ENCOUNTER — Encounter: Payer: Self-pay | Admitting: Internal Medicine

## 2014-11-12 ENCOUNTER — Non-Acute Institutional Stay (SKILLED_NURSING_FACILITY): Payer: Medicare Other | Admitting: Internal Medicine

## 2014-11-12 DIAGNOSIS — F329 Major depressive disorder, single episode, unspecified: Secondary | ICD-10-CM

## 2014-11-12 DIAGNOSIS — L8992 Pressure ulcer of unspecified site, stage 2: Secondary | ICD-10-CM | POA: Diagnosis not present

## 2014-11-12 DIAGNOSIS — G20 Parkinson's disease: Secondary | ICD-10-CM | POA: Diagnosis not present

## 2014-11-12 DIAGNOSIS — G9340 Encephalopathy, unspecified: Secondary | ICD-10-CM | POA: Diagnosis not present

## 2014-11-12 DIAGNOSIS — F419 Anxiety disorder, unspecified: Secondary | ICD-10-CM | POA: Insufficient documentation

## 2014-11-12 DIAGNOSIS — F32A Depression, unspecified: Secondary | ICD-10-CM

## 2014-11-12 NOTE — Assessment & Plan Note (Signed)
As per chart she does have Parkinson disease and has recently have increase dose on her sinemet.  -after discussing with neurology and following recommendations, her sinemet dose has been readjusted and patient is no longer confused and her degree of stiffness has improved as well -plan is to continue current new dose of IR sinemet and outpatient follow up with neurology service SNF - constant reorientation, will minimize use of anxiolytics

## 2014-11-12 NOTE — Progress Notes (Signed)
MRN: [id number] Name: Susan Simmons  Sex: female Age: 80 y.o. DOB: [birth year]  [facility] Facility/Room:106 Level Of Care: SNF Provider: Merrilee Seashore D Emergency Contacts: Extended Emergency Contact Information Primary Emergency Contact: Adams,[name] Address: [address]           [place], [place] [address] [place] Home Phone: [phone] Relation: Daughter  Code Status:   Allergies: Review of patient's allergies indicates no known allergies.  Chief Complaint  Patient presents with  . New Admit To SNF    HPI: Patient is 80 y.o. female with history of depression and Parkinson-like symptoms who was recently started restarted on Sinemet on Monday 11/01/2014 by patient's neurologist was found to have increasing somnolence and decreased appetite and also was found to have some regurgitation of food and patient is unable to eat well. Patient was advised to come to the ER. In the ER patient was found to have muscle rigidity and appears confused.Pt was admitted form 10/3-6 where her sinemet dose was reworked , as well as other meds and pt's symptoms improved. Pt is admitted to SNF with generalized weakness for OT/PT. While at SNF pt will be followed for anxiety, tx with buspar, depression, tx by holding remeron and pressure ulcer tx with wound care.  Past Medical History  Diagnosis Date  . Anxiety   . High cholesterol   . Depression     Past Surgical History  Procedure Laterality Date  . Denies surgical history        Medication List       This list is accurate as of: 11/12/14 11:36 AM.  Always use your most recent med list.               acetaminophen 325 MG tablet  Commonly known as:  TYLENOL  Take 2 tablets (650 mg total) by mouth every 6 (six) hours as needed for mild pain (or Fever >/= 101).     busPIRone 5 MG tablet  Commonly known as:  BUSPAR  Take 1 tablet (5 mg total) by mouth 3 (three) times daily.     carbidopa-levodopa 25-100 MG  tablet  Commonly known as:  SINEMET IR  Take 1 tablet by mouth 2 (two) times daily.     feeding supplement (ENSURE ENLIVE) Liqd  Take 237 mLs by mouth 2 (two) times daily between meals.        No orders of the defined types were placed in this encounter.     There is no immunization history on file for this patient.  Social History  Substance Use Topics  . Smoking status: Former Smoker    Quit date: 03/04/1996  . Smokeless tobacco: Not on file  . Alcohol Use: No    Family history is unobtainable. Pt with dementia, she can't remember anything; documented neg for parkinsons  Review of Systems  DATA OBTAINED: from nurse - no concerns GENERAL:  no fevers, fatigue, appetite changes SKIN: No itching, rash  EYES: No eye pain, redness, discharge EARS: No earache, tinnitus, change in hearing NOSE: No congestion, drainage or bleeding  MOUTH/THROAT: No mouth or tooth pain, No sore throat RESPIRATORY: No cough, wheezing, SOB CARDIAC: No chest pain, palpitations, lower extremity edema  GI: No abdominal pain, No N/V/D or constipation, No heartburn or reflux  GU: No dysuria, frequency or urgency, or incontinence  MUSCULOSKELETAL: No unrelieved bone/joint pain NEUROLOGIC: No headache, dizziness or focal weakness PSYCHIATRIC: No c/o anxiety or sadness   Ceasar Mons  Vitals:   11/12/14 1107  BP: 115/57  Pulse: 20  Temp: 98.2 F (36.8 C)  Resp: 84    SpO2 Readings from Last 1 Encounters:  11/11/14 99%        Physical Exam  GENERAL APPEARANCE: Alert,min conversant,  No acute distress.  SKIN: No diaphoresis rash;sacral decub not visualized, abrasion R pinna, not infected HEAD: Normocephalic, atraumatic  EYES: Conjunctiva/lids clear. Pupils round, reactive. EOMs intact.  EARS: External exam WNL, canals clear. Hearing grossly normal.  NOSE: No deformity or discharge.  MOUTH/THROAT: Lips w/o lesions  RESPIRATORY: Breathing is even, unlabored. Lung sounds are clear    CARDIOVASCULAR: Heart RRR no murmurs, rubs or gallops. No peripheral edema.   GASTROINTESTINAL: Abdomen is soft, non-tender, not distended w/ normal bowel sounds. GENITOURINARY: Bladder non tender, not distended  MUSCULOSKELETAL: No abnormal joints or musculature NEUROLOGIC:  Cranial nerves 2-12 grossly intact. Moves all extremities  PSYCHIATRIC: vague, not oriented, calm, no behavioral issues  Patient Active Problem List   Diagnosis Date Noted  . Anxiety 11/12/2014  . Malnutrition of moderate degree 11/10/2014  . Parkinson disease (HCC)   . Pressure ulcer stage II 11/09/2014  . Altered mental status 11/08/2014  . Parkinsonism (HCC) 11/01/2014  . E-coli UTI   . Acute encephalopathy 08/02/2014  . Fall 08/02/2014  . Generalized weakness 08/02/2014  . UTI (lower urinary tract infection) 08/01/2014  . Depression 05/02/2014    CBC    Component Value Date/Time   WBC 2.7* 11/11/2014 0502   RBC 3.65* 11/11/2014 0502   HGB 10.5* 11/11/2014 0502   HCT 32.5* 11/11/2014 0502   PLT 264 11/11/2014 0502   MCV 89.0 11/11/2014 0502   LYMPHSABS 2.0 11/09/2014 0710   MONOABS 0.6 11/09/2014 0710   EOSABS 0.3 11/09/2014 0710   BASOSABS 0.0 11/09/2014 0710    CMP     Component Value Date/Time   NA 140 11/11/2014 0502   K 3.7 11/11/2014 0502   CL 110 11/11/2014 0502   CO2 26 11/11/2014 0502   GLUCOSE 90 11/11/2014 0502   BUN 9 11/11/2014 0502   CREATININE 0.66 11/11/2014 0502   CALCIUM 8.6* 11/11/2014 0502   PROT 5.9* 11/09/2014 0710   ALBUMIN 2.5* 11/09/2014 0710   AST 36 11/09/2014 0710   ALT 14 11/09/2014 0710   ALKPHOS 50 11/09/2014 0710   BILITOT 0.4 11/09/2014 0710   GFRNONAA >60 11/11/2014 0502   GFRAA >60 11/11/2014 0502    No results found for: HGBA1C   Ct Head Wo Contrast  11/09/2014   CLINICAL DATA:  New medications for Parkinson's disease. Now with decreased activity and decreased interpersonal interactions.  EXAM: CT HEAD WITHOUT CONTRAST  TECHNIQUE: Contiguous  axial images were obtained from the base of the skull through the vertex without intravenous contrast.  COMPARISON:  08/01/2014  FINDINGS: There is no intracranial hemorrhage, mass or evidence of acute infarction. There is moderate generalized atrophy. There is moderate chronic microvascular ischemic change. There is no significant extra-axial fluid collection.  No acute intracranial findings are evident.  IMPRESSION: No acute findings. There is moderate generalized atrophy and chronic small vessel disease.   Electronically Signed   By: Ellery Plunk M.D.   On: 11/09/2014 01:10   Dg Chest Port 1 View  11/09/2014   CLINICAL DATA:  Acute onset of encephalopathy. Patient unresponsive. Admission chest radiograph. Initial encounter.  EXAM: PORTABLE CHEST 1 VIEW  COMPARISON:  Chest radiograph performed 07/26/2014  FINDINGS: The lungs are well-aerated and clear. There is  no evidence of focal opacification, pleural effusion or pneumothorax.  The cardiomediastinal silhouette is borderline normal in size. No acute osseous abnormalities are seen.  IMPRESSION: No acute cardiopulmonary process seen.   Electronically Signed   By: Roanna Raider M.D.   On: 11/09/2014 01:37    Not all labs, radiology exams or other studies done during hospitalization come through on my EPIC note; however they are reviewed by me.    Assessment and Plan  Acute encephalopathy As per chart she does have Parkinson disease and has recently have increase dose on her sinemet.  -after discussing with neurology and following recommendations, her sinemet dose has been readjusted and patient is no longer confused and her degree of stiffness has improved as well -plan is to continue current new dose of IR sinemet and outpatient follow up with neurology service SNF - constant reorientation, will minimize use of anxiolytics   Parkinson disease (HCC) she does have Parkinson disease and has recently have increase dose on her sinemet.   -after discussing with neurology and following recommendations, her sinemet dose has been readjusted and patient is no longer confused and her degree of stiffness has improved as well SNF - pt still confused but calm; continue  new dose of IR sinemet and outpatient follow up with neurology service  Depression SNF - remeron being held while MS change continues to be parsed out  Anxiety SNF - will conti ue new lower dose Buspar 5 mg TID  Pressure ulcer stage II SNF - reported not infected, wound care nurse following daily   Time spent 35 min;> 50% of time with patient was spent reviewing records, labs, tests and studies, counseling and developing plan of care  Margit Hanks, MD

## 2014-11-12 NOTE — Assessment & Plan Note (Signed)
SNF - will conti ue new lower dose Buspar 5 mg TID

## 2014-11-12 NOTE — Assessment & Plan Note (Signed)
SNF - reported not infected, wound care nurse following daily

## 2014-11-12 NOTE — Assessment & Plan Note (Signed)
she does have Parkinson disease and has recently have increase dose on her sinemet.  -after discussing with neurology and following recommendations, her sinemet dose has been readjusted and patient is no longer confused and her degree of stiffness has improved as well SNF - pt still confused but calm; continue  new dose of IR sinemet and outpatient follow up with neurology service

## 2014-11-12 NOTE — Assessment & Plan Note (Signed)
SNF - remeron being held while MS change continues to be parsed out

## 2014-11-30 ENCOUNTER — Non-Acute Institutional Stay (SKILLED_NURSING_FACILITY): Payer: Medicare Other | Admitting: Internal Medicine

## 2014-11-30 ENCOUNTER — Encounter: Payer: Self-pay | Admitting: Internal Medicine

## 2014-11-30 DIAGNOSIS — F419 Anxiety disorder, unspecified: Secondary | ICD-10-CM

## 2014-11-30 DIAGNOSIS — R531 Weakness: Secondary | ICD-10-CM

## 2014-11-30 DIAGNOSIS — L8992 Pressure ulcer of unspecified site, stage 2: Secondary | ICD-10-CM | POA: Diagnosis not present

## 2014-11-30 DIAGNOSIS — S90821A Blister (nonthermal), right foot, initial encounter: Secondary | ICD-10-CM | POA: Diagnosis not present

## 2014-11-30 DIAGNOSIS — F329 Major depressive disorder, single episode, unspecified: Secondary | ICD-10-CM

## 2014-11-30 DIAGNOSIS — F32A Depression, unspecified: Secondary | ICD-10-CM

## 2014-11-30 DIAGNOSIS — G20 Parkinson's disease: Secondary | ICD-10-CM | POA: Diagnosis not present

## 2014-11-30 NOTE — Assessment & Plan Note (Signed)
SDTI R heel -wound care at home

## 2014-11-30 NOTE — Progress Notes (Signed)
MRN: [id number] Name: Susan Simmons  Sex: female Age: 80 y.o. DOB: 10-25-1934  [facility] Facility/Room:106 Level Of Care: SNF Provider: Merrilee Seashore D Emergency Contacts: Extended Emergency Contact Information Primary Emergency Contact: Adams,[name] Address: [address]           [place], [place] [address] [place] Home Phone: [phone] Relation: Daughter  Code Status:FULL   Allergies: Review of patient's allergies indicates no known allergies.  Chief Complaint  Patient presents with  . Discharge Note    HPI: Patient is 80 y.o. female whohistory of depression and Parkinson-like symptoms who was recently started restarted on Sinemet on Monday 11/01/2014 by patient's neurologist was found to have increasing somnolence and decreased appetite and also was found to have some regurgitation of food and patient is unable to eat well. Patient was advised to come to the ER. In the ER patient was found to have muscle rigidity and appears confused.Pt was admitted form 10/3-6 where her sinemet dose was reworked , as well as other meds and pt's symptoms improved. Pt is admitted to SNF with generalized weakness for OT/PT. Pt is ready to be d/c to home. She has a healing stage 3 sacral wound and a SDTI of R heel which will require care at home.  Past Medical History  Diagnosis Date  . Anxiety   . High cholesterol   . Depression     Past Surgical History  Procedure Laterality Date  . Denies surgical history        Medication List       This list is accurate as of: 11/30/14  3:35 PM.  Always use your most recent med list.               acetaminophen 325 MG tablet  Commonly known as:  TYLENOL  Take 2 tablets (650 mg total) by mouth every 6 (six) hours as needed for mild pain (or Fever >/= 101).     busPIRone 5 MG tablet  Commonly known as:  BUSPAR  Take 1 tablet (5 mg total) by mouth 3 (three) times daily.     carbidopa-levodopa 25-100 MG tablet   Commonly known as:  SINEMET IR  Take 1 tablet by mouth 2 (two) times daily.     feeding supplement (ENSURE ENLIVE) Liqd  Take 237 mLs by mouth 2 (two) times daily between meals.        No orders of the defined types were placed in this encounter.     There is no immunization history on file for this patient.  Social History  Substance Use Topics  . Smoking status: Former Smoker    Quit date: 03/04/1996  . Smokeless tobacco: Not on file  . Alcohol Use: No    Filed Vitals:   11/30/14 1532  BP: 110/54  Pulse: 81  Temp: 96.3 F (35.7 C)  Resp: 18    Physical Exam  GENERAL APPEARANCE: Alert,  No acute distress.  HEENT: Unremarkable. RESPIRATORY: Breathing is even, unlabored. Lung sounds are clear   CARDIOVASCULAR: Heart RRR no murmurs, rubs or gallops. No peripheral edema.  GASTROINTESTINAL: Abdomen is soft, non-tender, not distended w/ normal bowel sounds.  NEUROLOGIC: Cranial nerves 2-12 grossly intact. Moves all extremities  Patient Active Problem List   Diagnosis Date Noted  . Anxiety 11/12/2014  . Malnutrition of moderate degree 11/10/2014  . Parkinson disease (HCC)   . Pressure ulcer stage II 11/09/2014  . Altered mental status 11/08/2014  .  Parkinsonism (HCC) 11/01/2014  . E-coli UTI   . Acute encephalopathy 08/02/2014  . Fall 08/02/2014  . Generalized weakness 08/02/2014  . UTI (lower urinary tract infection) 08/01/2014  . Depression 05/02/2014    CBC    Component Value Date/Time   WBC 2.7* 11/11/2014 0502   RBC 3.65* 11/11/2014 0502   HGB 10.5* 11/11/2014 0502   HCT 32.5* 11/11/2014 0502   PLT 264 11/11/2014 0502   MCV 89.0 11/11/2014 0502   LYMPHSABS 2.0 11/09/2014 0710   MONOABS 0.6 11/09/2014 0710   EOSABS 0.3 11/09/2014 0710   BASOSABS 0.0 11/09/2014 0710    CMP     Component Value Date/Time   NA 140 11/11/2014 0502   K 3.7 11/11/2014 0502   CL 110 11/11/2014 0502   CO2 26 11/11/2014 0502   GLUCOSE 90 11/11/2014 0502   BUN 9  11/11/2014 0502   CREATININE 0.66 11/11/2014 0502   CALCIUM 8.6* 11/11/2014 0502   PROT 5.9* 11/09/2014 0710   ALBUMIN 2.5* 11/09/2014 0710   AST 36 11/09/2014 0710   ALT 14 11/09/2014 0710   ALKPHOS 50 11/09/2014 0710   BILITOT 0.4 11/09/2014 0710   GFRNONAA >60 11/11/2014 0502   GFRAA >60 11/11/2014 0502    Assessment and Plan  Pt is d/c to home with HH/OT/PT/ST and Nursing for wound care. Rxs have been written.   Time spent 30 min Margit HanksALEXANDER, ANNE D, MD

## 2015-01-06 ENCOUNTER — Inpatient Hospital Stay (HOSPITAL_COMMUNITY)
Admission: EM | Admit: 2015-01-06 | Discharge: 2015-01-10 | DRG: 057 | Disposition: A | Discharge status: Home Health | Payer: Medicare Other | Attending: Internal Medicine | Admitting: Internal Medicine

## 2015-01-06 ENCOUNTER — Encounter (HOSPITAL_COMMUNITY): Payer: Self-pay | Admitting: Emergency Medicine

## 2015-01-06 ENCOUNTER — Emergency Department (HOSPITAL_COMMUNITY): Payer: Medicare Other

## 2015-01-06 ENCOUNTER — Inpatient Hospital Stay (HOSPITAL_COMMUNITY): Payer: Medicare Other

## 2015-01-06 ENCOUNTER — Ambulatory Visit: Payer: Medicare Other | Admitting: Neurology

## 2015-01-06 DIAGNOSIS — R4701 Aphasia: Secondary | ICD-10-CM | POA: Diagnosis present

## 2015-01-06 DIAGNOSIS — Z6820 Body mass index (BMI) 20.0-20.9, adult: Secondary | ICD-10-CM | POA: Diagnosis not present

## 2015-01-06 DIAGNOSIS — E440 Moderate protein-calorie malnutrition: Secondary | ICD-10-CM | POA: Diagnosis present

## 2015-01-06 DIAGNOSIS — F419 Anxiety disorder, unspecified: Secondary | ICD-10-CM | POA: Diagnosis present

## 2015-01-06 DIAGNOSIS — G94 Other disorders of brain in diseases classified elsewhere: Secondary | ICD-10-CM | POA: Diagnosis present

## 2015-01-06 DIAGNOSIS — G20 Parkinson's disease: Secondary | ICD-10-CM | POA: Diagnosis present

## 2015-01-06 DIAGNOSIS — D72819 Decreased white blood cell count, unspecified: Secondary | ICD-10-CM | POA: Diagnosis present

## 2015-01-06 DIAGNOSIS — R269 Unspecified abnormalities of gait and mobility: Secondary | ICD-10-CM | POA: Diagnosis present

## 2015-01-06 DIAGNOSIS — F32A Depression, unspecified: Secondary | ICD-10-CM | POA: Diagnosis present

## 2015-01-06 DIAGNOSIS — R401 Stupor: Secondary | ICD-10-CM

## 2015-01-06 DIAGNOSIS — Z87891 Personal history of nicotine dependence: Secondary | ICD-10-CM

## 2015-01-06 DIAGNOSIS — R4182 Altered mental status, unspecified: Secondary | ICD-10-CM

## 2015-01-06 DIAGNOSIS — L8992 Pressure ulcer of unspecified site, stage 2: Secondary | ICD-10-CM | POA: Diagnosis present

## 2015-01-06 DIAGNOSIS — I10 Essential (primary) hypertension: Secondary | ICD-10-CM | POA: Diagnosis not present

## 2015-01-06 DIAGNOSIS — R404 Transient alteration of awareness: Secondary | ICD-10-CM | POA: Diagnosis not present

## 2015-01-06 DIAGNOSIS — F329 Major depressive disorder, single episode, unspecified: Secondary | ICD-10-CM | POA: Diagnosis present

## 2015-01-06 DIAGNOSIS — G9340 Encephalopathy, unspecified: Secondary | ICD-10-CM | POA: Diagnosis present

## 2015-01-06 DIAGNOSIS — L8993 Pressure ulcer of unspecified site, stage 3: Secondary | ICD-10-CM | POA: Diagnosis present

## 2015-01-06 HISTORY — DX: Parkinson's disease: G20

## 2015-01-06 HISTORY — DX: Parkinson's disease without dyskinesia, without mention of fluctuations: G20.A1

## 2015-01-06 LAB — COMPREHENSIVE METABOLIC PANEL
ALBUMIN: 3.1 g/dL — AB (ref 3.5–5.0)
ALT: 12 U/L — ABNORMAL LOW (ref 14–54)
ANION GAP: 8 (ref 5–15)
AST: 25 U/L (ref 15–41)
Alkaline Phosphatase: 71 U/L (ref 38–126)
BILIRUBIN TOTAL: 0.5 mg/dL (ref 0.3–1.2)
BUN: 16 mg/dL (ref 6–20)
CALCIUM: 9.1 mg/dL (ref 8.9–10.3)
CO2: 25 mmol/L (ref 22–32)
Chloride: 105 mmol/L (ref 101–111)
Creatinine, Ser: 0.85 mg/dL (ref 0.44–1.00)
GFR calc non Af Amer: >60 mL/min (ref >60)
GLUCOSE: 89 mg/dL (ref 65–99)
POTASSIUM: 4.1 mmol/L (ref 3.5–5.1)
SODIUM: 138 mmol/L (ref 135–145)
TOTAL PROTEIN: 6.7 g/dL (ref 6.5–8.1)

## 2015-01-06 LAB — I-STAT CHEM 8, ED
BUN: 16 mg/dL (ref 6–20)
CREATININE: 0.8 mg/dL (ref 0.44–1.00)
Calcium, Ion: 1.16 mmol/L (ref 1.13–1.30)
Chloride: 103 mmol/L (ref 101–111)
Glucose, Bld: 85 mg/dL (ref 65–99)
HEMATOCRIT: 40 % (ref 36.0–46.0)
HEMOGLOBIN: 13.6 g/dL (ref 12.0–15.0)
POTASSIUM: 4.2 mmol/L (ref 3.5–5.1)
SODIUM: 141 mmol/L (ref 135–145)
TCO2: 25 mmol/L (ref 0–100)

## 2015-01-06 LAB — CBC
HCT: 38.9 % (ref 36.0–46.0)
HCT: 35.4 % — ABNORMAL LOW (ref 36.0–46.0)
HEMOGLOBIN: 11.7 g/dL — AB (ref 12.0–15.0)
Hemoglobin: 12.2 g/dL (ref 12.0–15.0)
MCH: 27.9 pg (ref 26.0–34.0)
MCH: 28.5 pg (ref 26.0–34.0)
MCHC: 31.4 g/dL (ref 30.0–36.0)
MCHC: 33.1 g/dL (ref 30.0–36.0)
MCV: 89 fL (ref 78.0–100.0)
MCV: 86.3 fL (ref 78.0–100.0)
Platelets: 325 10*3/uL (ref 150–400)
Platelets: 295 10*3/uL (ref 150–400)
RBC: 4.37 MIL/uL (ref 3.87–5.11)
RBC: 4.1 MIL/uL (ref 3.87–5.11)
RDW: 13.6 % (ref 11.5–15.5)
RDW: 13.6 % (ref 11.5–15.5)
WBC: 3.6 10*3/uL — ABNORMAL LOW (ref 4.0–10.5)
WBC: 4.4 10*3/uL (ref 4.0–10.5)

## 2015-01-06 LAB — CREATININE, SERUM: CREATININE: 0.79 mg/dL (ref 0.44–1.00)

## 2015-01-06 LAB — TSH: TSH: 1.682 u[IU]/mL (ref 0.350–4.500)

## 2015-01-06 LAB — DIFFERENTIAL
BASOS ABS: 0 10*3/uL (ref 0.0–0.1)
Basophils Relative: 0 %
Eosinophils Absolute: 0.3 10*3/uL (ref 0.0–0.7)
Eosinophils Relative: 8 %
LYMPHS ABS: 1.8 10*3/uL (ref 0.7–4.0)
Lymphocytes Relative: 51 %
MONO ABS: 0.6 10*3/uL (ref 0.1–1.0)
Monocytes Relative: 16 %
NEUTROS ABS: 0.9 10*3/uL — AB (ref 1.7–7.7)
NEUTROS PCT: 25 %

## 2015-01-06 LAB — PROTIME-INR
INR: 1.12 (ref 0.00–1.49)
PROTHROMBIN TIME: 14.6 s (ref 11.6–15.2)

## 2015-01-06 LAB — CK: CK TOTAL: 110 U/L (ref 38–234)

## 2015-01-06 LAB — AMMONIA: Ammonia: 22 umol/L (ref 9–35)

## 2015-01-06 LAB — APTT: APTT: 27 s (ref 24–37)

## 2015-01-06 LAB — I-STAT TROPONIN, ED: TROPONIN I, POC: 0.02 ng/mL (ref 0.00–0.08)

## 2015-01-06 LAB — LACTIC ACID, PLASMA: LACTIC ACID, VENOUS: 1.2 mmol/L (ref 0.5–2.0)

## 2015-01-06 LAB — ETHANOL

## 2015-01-06 LAB — CBG MONITORING, ED: GLUCOSE-CAPILLARY: 69 mg/dL (ref 65–99)

## 2015-01-06 MED ORDER — BUSPIRONE HCL 10 MG PO TABS
15.0000 mg | ORAL_TABLET | Freq: Two times a day (BID) | ORAL | Status: DC
  Administered 2015-01-07 – 2015-01-10 (×7): 15 mg via ORAL
  Filled 2015-01-06 (×7): qty 2

## 2015-01-06 MED ORDER — ENOXAPARIN SODIUM 40 MG/0.4ML ~~LOC~~ SOLN
40.0000 mg | SUBCUTANEOUS | Status: DC
  Administered 2015-01-07 – 2015-01-08 (×3): 40 mg via SUBCUTANEOUS
  Filled 2015-01-06 (×3): qty 0.4

## 2015-01-06 MED ORDER — DEXTROSE 50 % IV SOLN
INTRAVENOUS | Status: AC
  Administered 2015-01-06: 50 mL
  Filled 2015-01-06: qty 50

## 2015-01-06 MED ORDER — STROKE: EARLY STAGES OF RECOVERY BOOK
Freq: Once | Status: AC
  Administered 2015-01-07

## 2015-01-06 MED ORDER — ENSURE ENLIVE PO LIQD
237.0000 mL | Freq: Two times a day (BID) | ORAL | Status: DC
  Administered 2015-01-07 – 2015-01-10 (×7): 237 mL via ORAL
  Filled 2015-01-06 (×10): qty 237

## 2015-01-06 MED ORDER — SENNOSIDES-DOCUSATE SODIUM 8.6-50 MG PO TABS: 1.0000 | ORAL_TABLET | Freq: Every evening | ORAL | Status: DC | PRN

## 2015-01-06 MED ORDER — SODIUM CHLORIDE 0.9 % IJ SOLN
3.0000 mL | Freq: Two times a day (BID) | INTRAMUSCULAR | Status: DC
  Administered 2015-01-07 – 2015-01-08 (×2): 3 mL via INTRAVENOUS

## 2015-01-06 MED ORDER — CARBIDOPA-LEVODOPA ER 25-100 MG PO TBCR
1.0000 | EXTENDED_RELEASE_TABLET | Freq: Three times a day (TID) | ORAL | Status: DC
  Administered 2015-01-07 – 2015-01-08 (×5): 1 via ORAL
  Filled 2015-01-06 (×11): qty 1

## 2015-01-06 MED ORDER — ACETAMINOPHEN 325 MG PO TABS: 650.0000 mg | ORAL_TABLET | Freq: Four times a day (QID) | ORAL | Status: DC | PRN

## 2015-01-06 MED ORDER — MIRTAZAPINE 15 MG PO TABS
15.0000 mg | ORAL_TABLET | Freq: Every day | ORAL | Status: DC
Start: 2015-01-06 — End: 2015-01-10
  Administered 2015-01-08 – 2015-01-10 (×2): 15 mg via ORAL
  Filled 2015-01-06 (×3): qty 1

## 2015-01-06 NOTE — ED Notes (Signed)
Patient returned from to MRI 

## 2015-01-06 NOTE — ED Provider Notes (Addendum)
CSN: [id number]     Arrival date & time 01/06/15  1714 History   First MD Initiated Contact with Patient 01/06/15 1732     Chief Complaint  Patient presents with  . Altered Mental Status     HPI  Patient presents for evaluation with decreased responsiveness. Brought in by, and accompanied by her daughter who was at home with her today.  Last time known normal was 1500 today. Vision had had a normal day. Went to physical therapy/occupational therapy and participated. Laid down reclined in a chair adjacent her bed at 3 PM. At 5, her daughter came in to wake her up. Her bottom was still on the seat that she had fallen asleep in, however her head and shoulders were "slumped over" onto the adjacent bed  . Daughter states that this was unusual. She went to check on her and stated her mother would not respond to her verbally or follow her commands although her eyes were open and she was breathing" blinking". No stereotypical tonic-clonic seizure activity. Did not bite her tongue. Was not incontinent. No history of seizures. No history of strokes. Recent history ofParkinson's disease "like symptoms" over the last few months. Has been on Sinemet at a standard dose for the last 4 weeks. No similar episodes such as what happened today.  Past Medical History  Diagnosis Date  . Anxiety   . High cholesterol   . Depression   . Parkinson disease [facility])    Past Surgical History  Procedure Laterality Date  . Denies surgical history     Family History  Problem Relation Age of Onset  . Dementia Neg Hx   . Parkinsonism Neg Hx    Social History  Substance Use Topics  . Smoking status: Former Smoker    Quit date: 03/04/1996  . Smokeless tobacco: None  . Alcohol Use: No   OB History    No data available     Review of Systems  Unable to perform ROS: Patient nonverbal      Allergies  Review of patient's allergies indicates no known allergies.  Home Medications   Prior to Admission  medications   Medication Sig Start Date End Date Taking? Authorizing Provider  acetaminophen (TYLENOL) 325 MG tablet Take 2 tablets (650 mg total) by mouth every 6 (six) hours as needed for mild pain (or Fever >/= 101). 08/04/14  Yes Alison Murray, MD  busPIRone (BUSPAR) 15 MG tablet Take 15 mg by mouth 2 (two) times daily.   Yes Historical Provider, MD  Carbidopa-Levodopa ER (SINEMET CR) 25-100 MG tablet controlled release Take 1 tablet by mouth 3 (three) times daily.    Yes Historical Provider, MD  feeding supplement, ENSURE ENLIVE, (ENSURE ENLIVE) LIQD Take 237 mLs by mouth 2 (two) times daily between meals. 11/11/14  Yes Vassie Loll, MD  mirtazapine (REMERON) 15 MG tablet Take 15 mg by mouth at bedtime.   Yes Historical Provider, MD  carbidopa-levodopa (SINEMET IR) 25-100 MG tablet Take 1 tablet by mouth 2 (two) times daily. Patient not taking: Reported on 01/06/2015 11/11/14   Vassie Loll, MD   BP 141/61 mmHg  Pulse 81  Temp(Src) 96.5 F (35.8 C) (Oral)  Resp 20  SpO2 100% Physical Exam  Constitutional: No distress.  Thin, frail-appearing.  HENT:  Head: Normocephalic.  Eyes: Conjunctivae are normal. Pupils are equal, round, and reactive to light. No scleral icterus.  Neck: Normal range of motion. Neck supple. No thyromegaly present.  Cardiovascular: Normal rate and  regular rhythm.  Exam reveals no gallop and no friction rub.   No murmur heard. Pulmonary/Chest: Effort normal and breath sounds normal. No respiratory distress. She has no wheezes. She has no rales.  Abdominal: Soft. Bowel sounds are normal. She exhibits no distension. There is no tenderness. There is no rebound.  Musculoskeletal: Normal range of motion.  Neurological: She is alert.  Skin: Skin is warm and dry. No rash noted.  Psychiatric: She has a normal mood and affect. Her behavior is normal.    ED Course  Procedures (including critical care time) Labs Review Labs Reviewed  CBC - Abnormal; Notable for the  following:    WBC 3.6 (*)    All other components within normal limits  DIFFERENTIAL - Abnormal; Notable for the following:    Neutro Abs 0.9 (*)    All other components within normal limits  COMPREHENSIVE METABOLIC PANEL - Abnormal; Notable for the following:    Albumin 3.1 (*)    ALT 12 (*)    All other components within normal limits  ETHANOL  PROTIME-INR  APTT  CK  URINE RAPID DRUG SCREEN, HOSP PERFORMED  URINALYSIS, ROUTINE W REFLEX MICROSCOPIC (NOT AT [facility])  LACTIC ACID, PLASMA  LACTIC ACID, PLASMA  TSH  AMMONIA  I-STAT CHEM 8, ED  I-STAT TROPOININ, ED  CBG MONITORING, ED    Imaging Review Ct Head Wo Contrast  01/06/2015  CLINICAL DATA:  Code stroke. Not answering questions or following commands. EXAM: CT HEAD WITHOUT CONTRAST TECHNIQUE: Contiguous axial images were obtained from the base of the skull through the vertex without intravenous contrast. COMPARISON:  11/09/2014 FINDINGS: There is no evidence of mass effect, midline shift, or extra-axial fluid collections. There is no evidence of a space-occupying lesion or intracranial hemorrhage. There is no evidence of a cortical-based area of acute infarction. There is generalized cerebral atrophy. There is periventricular white matter low attenuation likely secondary to microangiopathy. The ventricles and sulci are appropriate for the patient's age. The basal cisterns are patent. Visualized portions of the orbits are unremarkable. The visualized portions of the paranasal sinuses and mastoid air cells are unremarkable. The osseous structures are unremarkable. IMPRESSION: 1. No acute intracranial pathology. 2. Chronic microvascular disease and cerebral atrophy. These results were called by telephone at the time of interpretation on 01/06/2015 at 5:42 pm to Dr. Rolland Porter , who verbally acknowledged these results. Electronically Signed   By: Elige Ko   On: 01/06/2015 17:43   Mr Brain Wo Contrast  01/06/2015  CLINICAL DATA:   80 year old female is unresponsive. Aphasia. Code stroke. Initial encounter. EXAM: MRI HEAD WITHOUT CONTRAST TECHNIQUE: Multiplanar, multiecho pulse sequences of the brain and surrounding structures were obtained without intravenous contrast. COMPARISON:  Head CT without contrast 1737 hours today, and earlier. FINDINGS: No restricted diffusion or evidence of acute infarction. Major intracranial vascular flow voids are within normal limits. No midline shift, mass effect, evidence of mass lesion, ventriculomegaly, extra-axial collection or acute intracranial hemorrhage. Cervicomedullary junction and pituitary are within normal limits. Negative visualized cervical spine. Supratentorial gray and white matter signal is normal for age. No cortical encephalomalacia or chronic cerebral blood products identified. There is minimal to mild patchy T2 hyperintensity in the pons, nonspecific. Cerebellum and deep gray matter are within normal limits. Mastoids and paranasal sinuses are clear. Negative orbit and scalp soft tissues. Normal bone marrow signal. IMPRESSION: No acute intracranial abnormality. Minimal to mild for age nonspecific signal changes, most commonly due to chronic small vessel disease.  Electronically Signed   By: Odessa FlemingH  Hall M.D.   On: 01/06/2015 18:43   I have personally reviewed and evaluated these images and lab results as part of my medical decision-making.   EKG Interpretation None      MDM   Final diagnoses:  Expressive aphasia   I left the care of another patient to assume the care of this patient upon her arrival. I have made her a "code stroke" after my initial evaluation. CT scan was obtained and is negative for acute abnormalities. Diffusion weighted MRI is negative. Patient evaluated by myself, as well as Dr. Elspeth ChoPeter Sumner of neurology in the emergency room. Dr. Hosie PoissonSumner [facility] to rule out some clinical status epilepticus. Care discussed with Dr.  Robb Matarrtiz of [facility] hospitalist. Patient has been requested.  Patient shows no clinical findings to suggest stroke and has no signs of stroke on MRI. Thus not continued as code stroke were given acute lytic therapy. Both Dr. Hosie PoissonSumner and I are in agreement with this.  CRITICAL CARE Performed by: Rolland PorterJAMES, Shatasia Cutshaw JOSEPH   Total critical care time: 45 minutes,  Critical care time was exclusive of separately billable procedures and treating other patients.  Critical care was necessary to treat or prevent imminent or life-threatening deterioration.  Critical care was time spent personally by me on the following activities: development of treatment plan with patient and/or surrogate as well as nursing, discussions with consultants, evaluation of patient's response to treatment, examination of patient, obtaining history from patient or surrogate, ordering and performing treatments and interventions, ordering and review of laboratory studies, ordering and review of radiographic studies, pulse oximetry and re-evaluation of patient's condition.    Rolland PorterMark Lamont Tant, MD 01/06/15 Barry Brunner1933  Rolland PorterMark Chaslyn Eisen, MD 01/06/15 [phone]

## 2015-01-06 NOTE — ED Notes (Signed)
MD at bedside. 

## 2015-01-06 NOTE — ED Notes (Signed)
Couldn't get blood or urine carelink already here

## 2015-01-06 NOTE — ED Notes (Addendum)
Per pt's daughter pt took nap around 230-3pm today. Went to wake her around 5pm and pt wouldn't respond, but she could tell pt was breathing.   Pt not answering questions or following commands in Triage room

## 2015-01-06 NOTE — ED Notes (Signed)
Patient transported to MRI 

## 2015-01-06 NOTE — H&P (Signed)
[facility] History and Physical  Susan Simmons WUJ:[id number] DOB: 11-Jul-1934 DOA: 01/06/2015  Referring physician:  PCP: Clayborn Heron, MD   Chief Complaint:   HPI: Susan Simmons is a 80 y.o. female  with a past medical history PARKINSON'S disease, anxiety, depression, hyperlipidemia who was brought to the emergency department by her daughter due to the patient having sudden altered mental status earlier this evening. Per patient's daughter, the patient took and not around 3 PM in the afternoon. She went to wake her up around 5 PM, but the patient would not respond normally. Her daughter states that she could tell that she was breathing.  Then later she became more awake, with her eyes open, but would not respond to any questions that her daughter asked her or say anything. Her daughter states that there were no tonic-clonic movements, no lips biting, no fecal or urinary incontinence.   Upon arrival to the emergency department, the patient continue not responding to questions, but has since then improved and answers simple questions at this time. She is unable to provide any history.   Review of Systems:  Unable to review due to the patient's mental status. Psych:  Per daughter.The patient did not respond to verbal commands earlier, although her eyes were open.  Past Medical History  Diagnosis Date  . Anxiety   . High cholesterol   . Depression   . Parkinson disease [facility])    Past Surgical History  Procedure Laterality Date  . Denies surgical history     Social History:  reports that she quit smoking about 18 years ago. She does not have any smokeless tobacco history on file. She reports that she does not drink alcohol or use illicit drugs.  No Known Allergies  Family History  Problem Relation Age of Onset  . Dementia Neg Hx   . Parkinsonism Neg Hx     Prior to Admission medications   Medication Sig Start Date End Date Taking? Authorizing Provider    acetaminophen (TYLENOL) 325 MG tablet Take 2 tablets (650 mg total) by mouth every 6 (six) hours as needed for mild pain (or Fever >/= 101). 08/04/14  Yes Alison Murray, MD  busPIRone (BUSPAR) 15 MG tablet Take 15 mg by mouth 2 (two) times daily.   Yes Historical Provider, MD  Carbidopa-Levodopa ER (SINEMET CR) 25-100 MG tablet controlled release Take 1 tablet by mouth 3 (three) times daily.    Yes Historical Provider, MD  feeding supplement, ENSURE ENLIVE, (ENSURE ENLIVE) LIQD Take 237 mLs by mouth 2 (two) times daily between meals. 11/11/14  Yes Vassie Loll, MD  mirtazapine (REMERON) 15 MG tablet Take 15 mg by mouth at bedtime.   Yes Historical Provider, MD  carbidopa-levodopa (SINEMET IR) 25-100 MG tablet Take 1 tablet by mouth 2 (two) times daily. Patient not taking: Reported on 01/06/2015 11/11/14   Vassie Loll, MD   Physical Exam: Filed Vitals:   01/06/15 1723 01/06/15 1751 01/06/15 1948  BP: 141/61  127/54  Pulse: 81  80  Temp: 98.1 F (36.7 C) 96.5 F (35.8 C)   TempSrc: Oral    Resp: 20  14  SpO2: 100%  100%    Wt Readings from Last 3 Encounters:  01/06/15 51.71 kg (114 lb)  11/09/14 52.028 kg (114 lb 11.2 oz)  11/01/14 52.436 kg (115 lb 9.6 oz)    General:  Appears calm and comfortable Eyes: PERRL, normal lids, irises & conjunctiva ENT: grossly normal hearing, lips and oral  mucosae are moist Neck: no LAD, masses or thyromegaly Cardiovascular: RRR, no m/r/g. No LE edema. Telemetry: SR, no arrhythmias  Respiratory: CTA bilaterally, no w/r/r. Normal respiratory effort. Abdomen: soft, ntnd Skin: no rash or induration seen on limited exam Musculoskeletal: Increased muscle tone BUE/BLE Psychiatric: Answers simple questions with yes or no, which is an improvement from earlier as per daughter. Neurologic: Basal tremor. Moves all extremities, but unable to fully evaluate.           Labs on Admission:  Basic Metabolic Panel:  Recent Labs Lab 01/06/15 1753  01/06/15 1804  NA 141 138  K 4.2 4.1  CL 103 105  CO2  --  25  GLUCOSE 85 89  BUN 16 16  CREATININE 0.80 0.85  CALCIUM  --  9.1   Liver Function Tests:  Recent Labs Lab 01/06/15 1804  AST 25  ALT 12*  ALKPHOS 71  BILITOT 0.5  PROT 6.7  ALBUMIN 3.1*   CBC:  Recent Labs Lab 01/06/15 1753 01/06/15 1804  WBC  --  3.6*  NEUTROABS  --  0.9*  HGB 13.6 12.2  HCT 40.0 38.9  MCV  --  89.0  PLT  --  325   Cardiac Enzymes:  Recent Labs Lab 01/06/15 1742  CKTOTAL 110    CBG:  Recent Labs Lab 01/06/15 1732  GLUCAP 69    Radiological Exams on Admission: Ct Head Wo Contrast  01/06/2015  CLINICAL DATA:  Code stroke. Not answering questions or following commands. EXAM: CT HEAD WITHOUT CONTRAST TECHNIQUE: Contiguous axial images were obtained from the base of the skull through the vertex without intravenous contrast. COMPARISON:  11/09/2014 FINDINGS: There is no evidence of mass effect, midline shift, or extra-axial fluid collections. There is no evidence of a space-occupying lesion or intracranial hemorrhage. There is no evidence of a cortical-based area of acute infarction. There is generalized cerebral atrophy. There is periventricular white matter low attenuation likely secondary to microangiopathy. The ventricles and sulci are appropriate for the patient's age. The basal cisterns are patent. Visualized portions of the orbits are unremarkable. The visualized portions of the paranasal sinuses and mastoid air cells are unremarkable. The osseous structures are unremarkable. IMPRESSION: 1. No acute intracranial pathology. 2. Chronic microvascular disease and cerebral atrophy. These results were called by telephone at the time of interpretation on 01/06/2015 at 5:42 pm to Dr. Rolland PorterMARK JAMES , who verbally acknowledged these results. Electronically Signed   By: Elige KoHetal  Patel   On: 01/06/2015 17:43   Mr Brain Wo Contrast  01/06/2015  CLINICAL DATA:  80 year old female is unresponsive.  Aphasia. Code stroke. Initial encounter. EXAM: MRI HEAD WITHOUT CONTRAST TECHNIQUE: Multiplanar, multiecho pulse sequences of the brain and surrounding structures were obtained without intravenous contrast. COMPARISON:  Head CT without contrast 1737 hours today, and earlier. FINDINGS: No restricted diffusion or evidence of acute infarction. Major intracranial vascular flow voids are within normal limits. No midline shift, mass effect, evidence of mass lesion, ventriculomegaly, extra-axial collection or acute intracranial hemorrhage. Cervicomedullary junction and pituitary are within normal limits. Negative visualized cervical spine. Supratentorial gray and white matter signal is normal for age. No cortical encephalomalacia or chronic cerebral blood products identified. There is minimal to mild patchy T2 hyperintensity in the pons, nonspecific. Cerebellum and deep gray matter are within normal limits. Mastoids and paranasal sinuses are clear. Negative orbit and scalp soft tissues. Normal bone marrow signal. IMPRESSION: No acute intracranial abnormality. Minimal to mild for age nonspecific signal changes, most commonly  due to chronic small vessel disease. Electronically Signed   By: Odessa Fleming M.D.   On: 01/06/2015 18:43    EKG: Independently reviewed. Vent. rate 79 BPM PR interval * ms QRS duration 94 ms QT/QTc 395/453 ms P-R-T axes -1 -44 163 Atrial fibrillation Left axis deviation Nonspecific T abnrm, anterolateral leads  Assessment/Plan Principal Problem:   Altered mental status   Aphasia Admit to telemetry at [facility] Frequent neuro checks. Check echocardiogram. Check carotid Doppler. Check hemoglobin A1c and fasting lipid profiles. Check electroencephalogram. Neurology is on the case.  Active Problems:   Depression Continue Remeron.    Parkinson disease (HCC) Continue Sinemet as recommended by neurology.    Anxiety Continue BuSpar 50 mg by mouth twice a day     The  neuro-hospitalist team is following the case.   Code Status: Full code. DVT Prophylaxis: Lovenox SQ. Family Communication: Daughter was by bedside. Disposition Plan: Admit to [facility] for further evaluation  Time spent: Over 70 minutes were spent during the process of this admission.  Bobette Mo [facility] Pager [phone].

## 2015-01-06 NOTE — ED Notes (Signed)
NT Ryland was unsuccessful on blood draw

## 2015-01-06 NOTE — Consult Note (Addendum)
Stroke Consult Consulting Physician: Dr Fayrene Fearing ED  Chief Complaint: altered mental status  HPI: Susan Simmons is an 80 y.o. female hx of HTN, Parkinson's disease presenting with acute change in mental status. History via patients daughter. She reports last seen well at 1500 when patient went down for a nap. At 1700 she noted that patient was unresponsive, not following commands. Only said, "I'm dead". No abnormal movements. Daughter reports this has never happened before. Denies any prior CVA/TIAs, no seizures. Has Parkinson's disease. At baseline requires a walker and assistance to ambulate. Daughter reports she can communicate without difficulty.   Takes Sinemet BID for her Parkinson's disease. Takes at 9 and 5. Denies missing any doses. No recent changes in her medication.   CT head imaging reviewed and no acute process noted.   Date last known well: 01/06/2015 Time last known well: 1500 tPA Given: no, no acute stroke on MRI Modified Rankin: Rankin Score=4  Past Medical History  Diagnosis Date  . Anxiety   . High cholesterol   . Depression   . Parkinson disease [facility])     Past Surgical History  Procedure Laterality Date  . Denies surgical history      Family History  Problem Relation Age of Onset  . Dementia Neg Hx   . Parkinsonism Neg Hx    Social History:  reports that she quit smoking about 18 years ago. She does not have any smokeless tobacco history on file. She reports that she does not drink alcohol or use illicit drugs.  Allergies: No Known Allergies   (Not in a hospital admission)  ROS: Out of a complete 14 system review, the patient complains of only the following symptoms, and all other reviewed systems are negative. +altered mental status  Physical Examination: Filed Vitals:   01/06/15 1723 01/06/15 1751  BP: 141/61   Pulse: 81   Temp: 98.1 F (36.7 C) 96.5 F (35.8 C)  Resp: 20    Physical Exam  Constitutional: He appears well-developed and  well-nourished.  Psych: Affect appropriate to situation Eyes: No scleral injection HENT: No OP obstrucion Head: Normocephalic.  Cardiovascular: Normal rate and regular rhythm.  Respiratory: Effort normal and breath sounds normal.  GI: Soft. Bowel sounds are normal. No distension. There is no tenderness.  Skin: WDI   Neurologic Examination: Mental Status: Eyes open, will make eye contact and briefly tract examiner. Non-verbal, not following commands Cranial Nerves: II: optic discs not visualized, blinks to threat bilaterally. PERRL III,IV, VI: ptosis not present, eyes midline, no gaze deviation, no nystagmus V,VII: face symmetric, corneal reflex intact VIII: unable to test IX,X: gag reflex present XI: unable to test XII: unable to test Motor: Unable to formally test. No spontaneous movement of extremities. No abnormal movements noted. Marked increased tone all 4 extremities.  Sensory: no notable grimace or withdrawal to noxious stimuli in all 4 extremities Deep Tendon Reflexes: 1+ and symmetric throughout Plantars: Right: downgoing   Left: downgoing Cerebellar: Unable to test Gait: unable to test  Laboratory Studies:   Basic Metabolic Panel:  Recent Labs Lab 01/06/15 1753  NA 141  K 4.2  CL 103  GLUCOSE 85  BUN 16  CREATININE 0.80    Liver Function Tests: No results for input(s): AST, ALT, ALKPHOS, BILITOT, PROT, ALBUMIN in the last 168 hours. No results for input(s): LIPASE, AMYLASE in the last 168 hours. No results for input(s): AMMONIA in the last 168 hours.  CBC:  Recent Labs Lab 01/06/15 1753  HGB  13.6  HCT 40.0    Cardiac Enzymes: No results for input(s): CKTOTAL, CKMB, CKMBINDEX, TROPONINI in the last 168 hours.  BNP: Invalid input(s): POCBNP  CBG:  Recent Labs Lab 01/06/15 1732  GLUCAP 69    Microbiology: Results for orders placed or performed during the hospital encounter of 11/08/14  MRSA PCR Screening     Status: Abnormal    Collection Time: 11/09/14  2:53 AM  Result Value Ref Range Status   MRSA by PCR POSITIVE (A) NEGATIVE Final    Comment:        The GeneXpert MRSA Assay (FDA approved for NASAL specimens only), is one component of a comprehensive MRSA colonization surveillance program. It is not intended to diagnose MRSA infection nor to guide or monitor treatment for MRSA infections. RESULT CALLED TO, READ BACK BY AND VERIFIED WITH: LAMB,S/4W @0422  ON 11/09/14 BY KARCZEWSKI,S.     Coagulation Studies: No results for input(s): LABPROT, INR in the last 72 hours.  Urinalysis: No results for input(s): COLORURINE, LABSPEC, PHURINE, GLUCOSEU, HGBUR, BILIRUBINUR, KETONESUR, PROTEINUR, UROBILINOGEN, NITRITE, LEUKOCYTESUR in the last 168 hours.  Invalid input(s): APPERANCEUR  Lipid Panel:  No results found for: CHOL, TRIG, HDL, CHOLHDL, VLDL, LDLCALC  HgbA1C: No results found for: HGBA1C  Urine Drug Screen:     Component Value Date/Time   LABOPIA NONE DETECTED 11/09/2014 0758   COCAINSCRNUR NONE DETECTED 11/09/2014 0758   LABBENZ NONE DETECTED 11/09/2014 0758   AMPHETMU NONE DETECTED 11/09/2014 0758   THCU NONE DETECTED 11/09/2014 0758   LABBARB NONE DETECTED 11/09/2014 0758    Alcohol Level: No results for input(s): ETH in the last 168 hours.  Other results:  Imaging: Ct Head Wo Contrast  01/06/2015  CLINICAL DATA:  Code stroke. Not answering questions or following commands. EXAM: CT HEAD WITHOUT CONTRAST TECHNIQUE: Contiguous axial images were obtained from the base of the skull through the vertex without intravenous contrast. COMPARISON:  11/09/2014 FINDINGS: There is no evidence of mass effect, midline shift, or extra-axial fluid collections. There is no evidence of a space-occupying lesion or intracranial hemorrhage. There is no evidence of a cortical-based area of acute infarction. There is generalized cerebral atrophy. There is periventricular white matter low attenuation likely secondary  to microangiopathy. The ventricles and sulci are appropriate for the patient's age. The basal cisterns are patent. Visualized portions of the orbits are unremarkable. The visualized portions of the paranasal sinuses and mastoid air cells are unremarkable. The osseous structures are unremarkable. IMPRESSION: 1. No acute intracranial pathology. 2. Chronic microvascular disease and cerebral atrophy. These results were called by telephone at the time of interpretation on 01/06/2015 at 5:42 pm to Dr. Rolland PorterMARK JAMES , who verbally acknowledged these results. Electronically Signed   By: Elige KoHetal  Patel   On: 01/06/2015 17:43    Assessment: 80 y.o. female hx of HLD, Parkinson's disease presenting to ED with acute change in mental status. Per daughter, she was last seen well at 1500. Daughter found her at 1700 completely unresponsive, non-verbal, not following commands. Brought to ED, code stroke activated. Patient sent to MRI for stat imaging as presentation atypical for acute stroke. DWI imaging reviewed and no acute infarct noted. Unclear etiology of presentation. With acute change will need to rule out seizure/post-ictal state. Will need to rule out metabolic or infectious etiology. Question if this presentation may be related to her Parkinson's or Sinemet. PD patients can have severe wearing off presentation though daughter denies patient has missed any doses. Abrupt withdrawal of Sinemet  can lead to a NMS type picture, though again daughter reports she has not missed or change her dose.    -stat EEG. Patient will be transferred to [facility] for this procedure -check CBC, lactic acid, CK, UDS, UA, ammonia, TSH -suggest NG tube placement for administration of patients evening dose of Sinemet -would consider increasing Sinemet to 1 tablet TID at 8-noon-4pm as tolerated -will continue to follow  Elspeth Cho, DO [facility] [phone]  If 7pm- 7am, please page neurology on call as listed in  [facility]. 01/06/2015, 6:07 PM

## 2015-01-07 ENCOUNTER — Ambulatory Visit (HOSPITAL_COMMUNITY): Payer: Medicare Other

## 2015-01-07 ENCOUNTER — Inpatient Hospital Stay (HOSPITAL_COMMUNITY)
Admit: 2015-01-07 | Discharge: 2015-01-07 | Disposition: A | Discharge status: Home / Self Care | Payer: Medicare Other | Attending: Emergency Medicine | Admitting: Emergency Medicine

## 2015-01-07 DIAGNOSIS — R404 Transient alteration of awareness: Secondary | ICD-10-CM

## 2015-01-07 DIAGNOSIS — I10 Essential (primary) hypertension: Secondary | ICD-10-CM

## 2015-01-07 DIAGNOSIS — G20 Parkinson's disease: Principal | ICD-10-CM

## 2015-01-07 DIAGNOSIS — D72819 Decreased white blood cell count, unspecified: Secondary | ICD-10-CM | POA: Diagnosis present

## 2015-01-07 DIAGNOSIS — F329 Major depressive disorder, single episode, unspecified: Secondary | ICD-10-CM

## 2015-01-07 DIAGNOSIS — E440 Moderate protein-calorie malnutrition: Secondary | ICD-10-CM

## 2015-01-07 DIAGNOSIS — R4701 Aphasia: Secondary | ICD-10-CM

## 2015-01-07 DIAGNOSIS — F419 Anxiety disorder, unspecified: Secondary | ICD-10-CM

## 2015-01-07 LAB — LIPID PANEL
CHOLESTEROL: 162 mg/dL (ref 0–200)
HDL: 51 mg/dL (ref >40)
LDL Cholesterol: 101 mg/dL — ABNORMAL HIGH (ref 0–99)
TRIGLYCERIDES: 50 mg/dL (ref <150)
Total CHOL/HDL Ratio: 3.2 RATIO
VLDL: 10 mg/dL (ref 0–40)

## 2015-01-07 LAB — PATHOLOGIST SMEAR REVIEW

## 2015-01-07 NOTE — Progress Notes (Signed)
[facility] Alert and oriented to self. Denies any pain. Oriented to room. Safety measure in place. Will continue to monitor.

## 2015-01-07 NOTE — Evaluation (Signed)
Clinical/Bedside Swallow Evaluation Patient Details  Name: Susan Simmons MRN: [id number] Date of Birth: [birth year]  Today's Date: 01/07/2015 Time: SLP Start Time (ACUTE ONLY): 1148 SLP Stop Time (ACUTE ONLY): 1156 SLP Time Calculation (min) (ACUTE ONLY): 8 min  Past Medical History:  Past Medical History  Diagnosis Date  . Anxiety   . High cholesterol   . Depression   . Parkinson disease [facility](HCC)    Past Surgical History:  Past Surgical History  Procedure Laterality Date  . Denies surgical history     HPI:  Susan Simmons is a 80 y.o. female with a past medical history Parkinson's disease, anxiety, depression, hyperlipidemia who was brought to the emergency department by her daughter due to the patient having sudden altered mental status. CT and MRI negative for acute findings   Assessment / Plan / Recommendation Clinical Impression  Pt is alert and accepting of small amounts of POs, which are consumed without overt difficulty despite altered mentation. Would initiate regular diet and thin liquids. SLP to f/u briefly for tolerance due to AMS.     Aspiration Risk  Mild aspiration risk    Diet Recommendation  Regular diet, thin liquids   Medication Administration: Whole meds with puree    Other  Recommendations Oral Care Recommendations: Oral care BID   Follow up Recommendations  None    Frequency and Duration min 1 x/week  1 week       Prognosis        Swallow Study   General Date of Onset: 01/06/15 HPI: Susan Simmons is a 80 y.o. female with a past medical history Parkinson's disease, anxiety, depression, hyperlipidemia who was brought to the emergency department by her daughter due to the patient having sudden altered mental status. CT and MRI negative for acute findings Type of Study: Bedside Swallow Evaluation Previous Swallow Assessment: BSE in 11/2014 recommending regular diet and thin liquids Diet Prior to this Study: NPO Temperature Spikes Noted:  No Respiratory Status: Room air History of Recent Intubation: No Behavior/Cognition: Alert;Cooperative;Confused;Requires cueing Oral Cavity Assessment: Within Functional Limits Oral Care Completed by SLP: No Oral Cavity - Dentition: Adequate natural dentition Self-Feeding Abilities: Needs assist Patient Positioning: Upright in bed Baseline Vocal Quality: Low vocal intensity    Oral/Motor/Sensory Function Overall Oral Motor/Sensory Function: Within functional limits   Ice Chips Ice chips: Not tested   Thin Liquid Thin Liquid: Within functional limits Presentation: Cup;Straw    Nectar Thick Nectar Thick Liquid: Not tested   Honey Thick Honey Thick Liquid: Not tested   Puree Puree: Within functional limits Presentation: Spoon   Solid Solid: Within functional limits      Maxcine HamLaura Paiewonsky, M.A. CCC-SLP [phone]  Maxcine Hamaiewonsky, Nassir Neidert 01/07/2015,12:16 PM

## 2015-01-07 NOTE — Progress Notes (Signed)
Utilization review completed. Aryn Kops, RN, BSN. 

## 2015-01-07 NOTE — Evaluation (Addendum)
Physical Therapy Evaluation Patient Details Name: Susan GasserShirley Simmons MRN: [id number] DOB: [birth year] Today's Date: 01/07/2015   History of Present Illness  Adm 12/1 with AMS; MRI brain negative; EEG pending. PMHx-Parkinson's, anxiety, depression  Clinical Impression  Pt admitted with above diagnosis. Family not present. Prior status based on medical records. Patient currently requires mod assist to ambulate with RW. Pt currently with functional limitations due to the deficits listed below (see PT Problem List).  Pt will benefit from skilled PT to increase their independence and safety with mobility to allow discharge to the venue listed below.       Follow Up Recommendations Home health PT;Supervision/Assistance - 24 hour (only if daughter can provide needed care; if not will need SNF)    Equipment Recommendations  None recommended by PT    Recommendations for Other Services OT consult     Precautions / Restrictions Precautions Precautions: Fall      Mobility  Bed Mobility Overal bed mobility: Needs Assistance Bed Mobility: Supine to Sit;Sit to Sidelying     Supine to sit: Max assist   Sit to sidelying: Max assist General bed mobility comments: pt able to pull herself up onto her elbows, but required max assist to take legs off bed and scoot to EOB  Transfers Overall transfer level: Needs assistance Equipment used: Rolling walker (2 wheeled) Transfers: Sit to/from Stand Sit to Stand: Max assist         General transfer comment: pt pushes posteriorly with come to stand; x 2  Ambulation/Gait Ambulation/Gait assistance: Mod assist Ambulation Distance (Feet): 8 Feet (x2) Assistive device: Rolling walker (2 wheeled) Gait Pattern/deviations: Step-through pattern;Decreased stride length;Decreased dorsiflexion - right;Decreased dorsiflexion - left;Shuffle   Gait velocity interpretation: Below normal speed for age/gender General Gait Details: toe walks  Stairs             Wheelchair Mobility    Modified Rankin (Stroke Patients Only) Modified Rankin (Stroke Patients Only) Pre-Morbid Rankin Score: Moderately severe disability Modified Rankin: Moderately severe disability     Balance Overall balance assessment: Needs assistance Sitting-balance support: No upper extremity supported;Feet unsupported Sitting balance-Leahy Scale: Poor Sitting balance - Comments: leans posterior and Rt Postural control: Posterior lean;Right lateral lean Standing balance support: Bilateral upper extremity supported Standing balance-Leahy Scale: Zero Standing balance comment: initially strong posterior bias                             Pertinent Vitals/Pain Pain Assessment: No/denies pain    Home Living Family/patient expects to be discharged to:: Unsure Living Arrangements: Children (daughter) Available Help at Discharge: Family;Available 24 hours/day Type of Home: House Home Access: Stairs to enter Entrance Stairs-Rails: None Entrance Stairs-Number of Steps: 3 Home Layout: One level Home Equipment: Walker - 2 wheels Additional Comments: Based on information in record from prior admission    Prior Function Level of Independence: Needs assistance   Gait / Transfers Assistance Needed: used RW with assist           Hand Dominance        Extremity/Trunk Assessment   Upper Extremity Assessment: Defer to OT evaluation           Lower Extremity Assessment: Generalized weakness;RLE deficits/detail;LLE deficits/detail;Difficult to assess due to impaired cognition RLE Deficits / Details: increased extensor tone, decreased dorsiflexion (-20) with toe-walking LLE Deficits / Details: increased extensor tone, decreased dorsiflexion (-20) with toe-walking  Cervical / Trunk Assessment: Other exceptions  Communication  Communication: Other (comment) (delayed)  Cognition Arousal/Alertness: Awake/alert Behavior During Therapy: Flat  affect;Anxious Overall Cognitive Status: No family/caregiver present to determine baseline cognitive functioning                      General Comments General comments (skin integrity, edema, etc.): Stood for up to 1 minute to achieve balance prior to ambulation or pericare (after toileting)    Exercises        Assessment/Plan    PT Assessment Patient needs continued PT services  PT Diagnosis Difficulty walking;Altered mental status   PT Problem List Decreased range of motion;Decreased activity tolerance;Decreased balance;Decreased mobility;Decreased cognition;Decreased knowledge of use of DME;Decreased safety awareness;Impaired tone  PT Treatment Interventions DME instruction;Gait training;Stair training;Functional mobility training;Therapeutic activities;Neuromuscular re-education;Balance training;Cognitive remediation;Patient/family education   PT Goals (Current goals can be found in the Care Plan section) Acute Rehab PT Goals Patient Stated Goal: unable PT Goal Formulation: Patient unable to participate in goal setting Time For Goal Achievement: 01/14/15 Potential to Achieve Goals: Fair    Frequency Min 3X/week   Barriers to discharge        Co-evaluation               End of Session Equipment Utilized During Treatment: Gait belt Activity Tolerance: Patient tolerated treatment well Patient left: in bed;with call bell/phone within reach;with bed alarm set Nurse Communication: Mobility status;Other (comment) (incontinent (urine) on arrival)         Time: 0272-5366 PT Time Calculation (min) (ACUTE ONLY): 32 min   Charges:   PT Evaluation $Initial PT Evaluation Tier I: 1 Procedure PT Treatments $Gait Training: 8-22 mins   PT G Codes:        Susan Simmons January 11, 2015, 12:54 PM Pager [phone]

## 2015-01-07 NOTE — Progress Notes (Signed)
Patient ID: Susan GasserShirley Simmons, female   DOB: 1934-11-08, 80 y.o.   MRN: [id number]  [facility] PROGRESS NOTE  Susan GasserShirley Simmons QMV:[id number] DOB: 1934-11-08 DOA: 01/06/2015 PCP: Clayborn HeronVictoria R Rankins, MD   Brief narrative:    80 y.o. female with known history of PARKINSON'S disease, anxiety, depression, hyperlipidemia who [facility] evaluation of sudden onset of change in mental status that was initially noted about 5 hours prior to this admission, difficulty staying awake which resulted in poor oral intake. No reported fevers, chills, abd or urinary concerns.   In ED, pt was somnolent and fairly difficult to awake, VSS except mild hypothermia T 96.5 F. Blood work unremarkable except mild leukopenia of 3.6. Neurology consulted and [facility]/TIA/seizure rule out.   Assessment/Plan:    Principal Problem:   Acute encephalopathy - appears to be multifactorial and secondary to progressive deconditioning and failure to thrive in the setting po parkinson's dz - more alert this AM - PT evaluation done and HH PT with full supervision vs SNF recommended  - will need to discuss with family what the preference is   Active Problems:   Depression - difficult to assess at this time as pt even though alert this am she remains minimally verbal  - continue to monitor     Pressure ulcer stage II - wound care team consulted     Malnutrition of moderate degree - SLP done and regular thin diet recommended - will advance diet     Parkinson disease (HCC) - EEG, no specific acute findings noted  - neurology team following, will continue to follow recommendations    Anxiety - appears stable at this time     Leukopenia - resolved   DVT prophylaxis - Lovenox SQ  Code Status: Full.  Family Communication:  plan of care discussed with the patient, no family at bedisde  Disposition Plan: Anticipate d/c by 12/04  IV access:  Peripheral  IV  Procedures and diagnostic studies:    Dg Chest 2 View 01/06/2015 No active cardiopulmonary disease.  No interval change.   Ct Head Wo Contrast 01/06/2015   No acute intracranial pathology. 2. Chronic microvascular disease and cerebral atrophy.  Mr Brain Wo Contrast 01/06/2015  No acute intracranial abnormality. Minimal to mild for age nonspecific signal changes, most commonly due to chronic small vessel disease.   Medical Consultants:  Neurology  Other Consultants:  PT/OT/SLP  IAnti-Infectives:   None  Debbora PrestoMAGICK-Luisantonio Adinolfi, MD  [facility] Pager [phone]  If 7PM-7AM, please contact night-coverage [email or web address] Password [email or web address] 01/07/2015, 4:33 PM   LOS: 1 day   HPI/Subjective: No events overnight.   Objective: Filed Vitals:   01/07/15 0430 01/07/15 0620 01/07/15 0932 01/07/15 1253  BP: 110/52 108/54 117/54 119/55  Pulse: 75 70 75 71  Temp: 98.1 F (36.7 C) 98.2 F (36.8 C) 97.9 F (36.6 C) 98.8 F (37.1 C)  TempSrc: Oral Oral Oral Oral  Resp: 14  16 16   Height:      Weight:      SpO2: 97% 97% 98% 99%    Intake/Output Summary (Last 24 hours) at 01/07/15 1633 Last data filed at 01/07/15 0934  Gross per 24 hour  Intake      0 ml  Output      0 ml  Net      0 ml    Exam:   General:  Pt is alert, minimally verbal  Cardiovascular: Regular rate and  rhythm, no rubs, no gallops  Respiratory: Clear to auscultation bilaterally, no wheezing, diminished breath sounds at bases   Abdomen: Soft, non tender, non distended, bowel sounds present, no guarding  Extremities: pulses DP and PT palpable bilaterally  Data Reviewed: Basic Metabolic Panel:  Recent Labs Lab 01/06/15 1753 01/06/15 1804 01/06/15 2146  NA 141 138  --   K 4.2 4.1  --   CL 103 105  --   CO2  --  25  --   GLUCOSE 85 89  --   BUN 16 16  --   CREATININE 0.80 0.85 0.79  CALCIUM  --  9.1  --    Liver Function Tests:  Recent Labs Lab 01/06/15 1804  AST 25  ALT 12*  ALKPHOS 71  BILITOT  0.5  PROT 6.7  ALBUMIN 3.1*    Recent Labs Lab 01/06/15 2001  AMMONIA 22   CBC:  Recent Labs Lab 01/06/15 1753 01/06/15 1804 01/06/15 2146  WBC  --  3.6* 4.4  NEUTROABS  --  0.9*  --   HGB 13.6 12.2 11.7*  HCT 40.0 38.9 35.4*  MCV  --  89.0 86.3  PLT  --  325 295   Cardiac Enzymes:  Recent Labs Lab 01/06/15 1742  CKTOTAL 110   CBG:  Recent Labs Lab 01/06/15 1732  GLUCAP 69   Scheduled Meds: . busPIRone  15 mg Oral BID  . Carbidopa-Levodopa ER  1 tablet Oral TID  . enoxaparin (LOVENOX) injection  40 mg Subcutaneous Q24H  . feeding supplement (ENSURE ENLIVE)  237 mL Oral BID BM  . mirtazapine  15 mg Oral QHS  . sodium chloride  3 mL Intravenous Q12H   Continuous Infusions:

## 2015-01-07 NOTE — Procedures (Addendum)
ELECTROENCEPHALOGRAM REPORT  Date of Study: 01/07/2015  Patient's Name: Susan Simmons MRN: [id number] Date of Birth: [birth year]  Referring Provider: Elspeth ChoPeter Sumner, DO  Indication: 80 y.o. female with a past medical history PARKINSON'S disease, anxiety, depression, hyperlipidemia who was brought to the emergency department by her daughter due to the patient having sudden altered mental status earlier this evening. Per patient's daughter, the patient took and not around 3 PM in the afternoon. She went to wake her up around 5 PM, but the patient would not respond normally. Her daughter states that she could tell that she was breathing. Then later she became more awake, with her eyes open, but would not respond to any questions that her daughter asked her or say anything. Her daughter states that there were no tonic-clonic movements, no lips biting, no fecal or urinary incontinence.   Medications: acetaminophen (TYLENOL) tablet 650 mg busPIRone (BUSPAR) tablet 15 mg Carbidopa-Levodopa ER (SINEMET CR) 25-100 MG tablet controlled release 1 tablet enoxaparin (LOVENOX) injection 40 mg feeding supplement (ENSURE ENLIVE) (ENSURE ENLIVE) liquid 237 mL mirtazapine (REMERON) tablet 15 mg senna-docusate (Senokot-S) tablet 1 tablet sodium chloride 0.9 % injection 3 mL  Technical Summary: This is a multichannel digital EEG recording, using the international 10-20 placement system with electrodes applied with paste and impedances below 5000 ohms.    Description: The EEG background is symmetric, mostly in the drowsy state.  In the most awake segments, she had a well-developed posterior dominant rhythm of 6 Hz, which is reactive to eye opening and closing.  Diffuse beta activity is seen, with a bilateral frontal preponderance.  No focal abnormalities are seen.  No focal or generalized epileptiform discharges are seen.  Stage II sleep is not seen.  Hyperventilation and photic stimulation were not  performed  ECG revealed normal cardiac rate and rhythm.  Impression: This is a mildly abnormal routine EEG of the awake and drowsy states, without activating procedures, due to mild background slowing.   Adam R. Everlena CooperJaffe, DO

## 2015-01-07 NOTE — Progress Notes (Signed)
  Echocardiogram 2D Echocardiogram has been performed.  Leta JunglingCooper, Lorisa Scheid M 01/07/2015, 12:29 PM

## 2015-01-07 NOTE — Evaluation (Signed)
Occupational Therapy Evaluation Patient Details Name: Susan Simmons MRN: [id number] DOB: 01/02/1935 Today's Date: 01/07/2015    History of Present Illness Adm 12/1 with AMS; MRI brain negative; EEG pending. PMHx-Parkinson's, anxiety, depression   Clinical Impression   Pt admitted with above diagnosis.  Family not present during eval, with pt providing limited information and limited participation.  Prior status based on medical records and small bits of information provided by patient.  Pt currently with functional limitations effecting her ability to complete ADL tasks due to the deficits listed below (see OT Problem List).  Pt with limited bed mobility and decreased BUE ROM during this session.  Pt will benefit from skilled OT to increase her independence and safety with ADLs to allow discharge to the venue listed below.    Follow Up Recommendations  Home health OT;Supervision/Assistance - 24 hour (SNF if daughter unable to provide current level of care)    Equipment Recommendations  Other (comment) (TBD)    Recommendations for Other Services       Precautions / Restrictions Precautions Precautions: Fall Restrictions Weight Bearing Restrictions: No      Mobility Bed Mobility Overal bed mobility: Needs Assistance Bed Mobility: Supine to Sit;Sit to Sidelying     Supine to sit: Max assist   Sit to sidelying: Max assist General bed mobility comments: pt able to pull herself up onto her elbows, but required max assist to take legs off bed and scoot to EOB  Transfers Overall transfer level: Needs assistance Equipment used: Rolling walker (2 wheeled) Transfers: Sit to/from Stand Sit to Stand: Max assist         General transfer comment: pt pushes posteriorly with come to stand; x 2    Balance Overall balance assessment: Needs assistance Sitting-balance support: No upper extremity supported;Feet unsupported Sitting balance-Leahy Scale: Poor Sitting balance -  Comments: leans posterior and Rt Postural control: Posterior lean;Right lateral lean Standing balance support: Bilateral upper extremity supported Standing balance-Leahy Scale: Zero Standing balance comment: initially strong posterior bias                            ADL Overall ADL's : Needs assistance/impaired                                       General ADL Comments: Pt with limited participation due to anxiousness.  Pt reports requiring assistance for daughter PTA will require assist at d/c.     Vision Vision Assessment?: Vision impaired- to be further tested in functional context Additional Comments: Pt with difficutly with scanning and required head turns.  Will need to be further assessed when pt more participatory.          Pertinent Vitals/Pain Pain Assessment: No/denies pain     Hand Dominance Left   Extremity/Trunk Assessment Upper Extremity Assessment Upper Extremity Assessment: Generalized weakness (difficult to assess due to limited participation)   Lower Extremity Assessment Lower Extremity Assessment: Generalized weakness;RLE deficits/detail;LLE deficits/detail;Difficult to assess due to impaired cognition RLE Deficits / Details: increased extensor tone, decreased dorsiflexion (-20) with toe-walking LLE Deficits / Details: increased extensor tone, decreased dorsiflexion (-20) with toe-walking   Cervical / Trunk Assessment Cervical / Trunk Assessment: Other exceptions Cervical / Trunk Exceptions: rigidity noted   Communication Communication Communication: Other (comment) (delayed)   Cognition Arousal/Alertness: Awake/alert Behavior During Therapy: Flat affect;Anxious (tearful) Overall Cognitive  Status: No family/caregiver present to determine baseline cognitive functioning                                Home Living Family/patient expects to be discharged to:: Unsure Living Arrangements: Children  (daughter) Available Help at Discharge: Family;Available 24 hours/day Type of Home: House Home Access: Stairs to enter Enter[employer] CorporationEntrance Stairs-Number of Steps: 3 Entrance Stairs-Rails: None Home Layout: One level     Bathroom Shower/Tub: Chief Strategy OfficerTub/shower unit   Bathroom Toilet: Standard     Home Equipment: Environmental consultantWalker - 2 wheels   Additional Comments: Based on information in record from prior admission      Prior Functioning/Environment Level of Independence: Needs assistance  Gait / Transfers Assistance Needed: used RW with assist ADL's / Homemaking Assistance Needed: total assist from daughter for past 2 weeks (from prior admission)        OT Diagnosis: Generalized weakness;Altered mental status   OT Problem List: Decreased strength;Decreased range of motion;Decreased activity tolerance;Impaired balance (sitting and/or standing);Impaired vision/perception;Impaired UE functional use   OT Treatment/Interventions: Self-care/ADL training;Energy conservation;DME and/or AE instruction;Therapeutic activities;Cognitive remediation/compensation;Patient/family education;Balance training    OT Goals(Current goals can be found in the care plan section) Acute Rehab OT Goals Patient Stated Goal: unable OT Goal Formulation: With patient Time For Goal Achievement: 01/21/15 Potential to Achieve Goals: Good  OT Frequency: Min 2X/week   Barriers to D/C: Decreased caregiver support  unsure if daughter can provide current level of physical assist          End of Session    Activity Tolerance: Patient limited by fatigue;Patient limited by lethargy Patient left: in bed;with bed alarm set   Time: [id number] OT Time Calculation (min): 12 min Charges:  OT General Charges $OT Visit: 1 Procedure OT Evaluation $Initial OT Evaluation Tier I: 1 Procedure G-CodesRosalio Simmons:    Susan Simmons, [phone] 01/07/2015, 3:49 PM

## 2015-01-07 NOTE — Progress Notes (Signed)
EEG Completed; Results Pending  

## 2015-01-08 ENCOUNTER — Inpatient Hospital Stay (HOSPITAL_COMMUNITY): Payer: Medicare Other

## 2015-01-08 ENCOUNTER — Encounter (HOSPITAL_COMMUNITY): Payer: Self-pay | Admitting: Internal Medicine

## 2015-01-08 DIAGNOSIS — L8992 Pressure ulcer of unspecified site, stage 2: Secondary | ICD-10-CM

## 2015-01-08 DIAGNOSIS — R4701 Aphasia: Secondary | ICD-10-CM

## 2015-01-08 LAB — HEMOGLOBIN A1C
Hgb A1c MFr Bld: 5.5 % (ref 4.8–5.6)
MEAN PLASMA GLUCOSE: 111 mg/dL

## 2015-01-08 LAB — MRSA PCR SCREENING: MRSA by PCR: NEGATIVE

## 2015-01-08 MED ORDER — CARBIDOPA-LEVODOPA 25-100 MG PO TABS
1.0000 | ORAL_TABLET | Freq: Three times a day (TID) | ORAL | Status: DC
  Administered 2015-01-08 – 2015-01-10 (×4): 1 via ORAL
  Filled 2015-01-08 (×6): qty 1

## 2015-01-08 MED ORDER — AMANTADINE HCL 100 MG PO CAPS
100.0000 mg | ORAL_CAPSULE | Freq: Two times a day (BID) | ORAL | Status: DC
  Administered 2015-01-08 – 2015-01-10 (×4): 100 mg via ORAL
  Filled 2015-01-08 (×6): qty 1

## 2015-01-08 MED ORDER — CARBIDOPA-LEVODOPA ER 25-100 MG PO TBCR
1.0000 | EXTENDED_RELEASE_TABLET | Freq: Every day | ORAL | Status: DC
Start: 2015-01-09 — End: 2015-01-10
  Administered 2015-01-10: 1 via ORAL
  Filled 2015-01-08 (×2): qty 1

## 2015-01-08 NOTE — Progress Notes (Signed)
Subjective: 80 year old female patient with done known Parkinson's disease, currently on Sinemet CR 1 tablet 3 times a day. She was admitted for altered mental status evaluation and had a negative MRI brain and EEG for any acute pathology. Patient unable to provide any further history. Per nursing staff, she is been stable with no significant agitation or confusion issues. No family members are available at bedside.   Objective: Current vital signs: BP 101/44 mmHg  Pulse 73  Temp(Src) 98.1 F (36.7 C) (Oral)  Resp 18  Ht 5\' 4"  (1.626 m)  Wt 52.9 kg (116 lb 10 oz)  BMI 20.01 kg/m2  SpO2 100% Vital signs in last 24 hours: Temp:  [98.1 F (36.7 C)-98.4 F (36.9 C)] 98.1 F (36.7 C) (12/03 1848) Pulse Rate:  [73-75] 73 (12/03 1848) Resp:  [16-18] 18 (12/03 1848) BP: (101-140)/(42-61) 101/44 mmHg (12/03 1848) SpO2:  [99 %-100 %] 100 % (12/03 1848)  Intake/Output from previous day: 12/02 0701 - 12/03 0700 In: 360 [P.O.:360] Out: -  Intake/Output this shift:   Nutritional status: Diet regular Room service appropriate?: Yes; Fluid consistency:: Thin  Neurologic Exam: She is alert, moderate to severe hypophonia is noted. Is also mild dysarthric speech no aphasia follows commands able to name simple things oriented to self and place,not to month or year Cranial nerves II-12 grossly appear intact On motor examination, moderate to severe rigidity in bilateral upper and lower extremities is seen, with cogwheeling at the elbows bilaterally. She is not cooperative for resistance testing but she had sustained antigravity strength in bilateral upper and lower extremities nearly symmetric.   Lab Results: Basic Metabolic Panel:  Recent Labs Lab 01/06/15 1753 01/06/15 1804 01/06/15 2146  NA 141 138  --   K 4.2 4.1  --   CL 103 105  --   CO2  --  25  --   GLUCOSE 85 89  --   BUN 16 16  --   CREATININE 0.80 0.85 0.79  CALCIUM  --  9.1  --     Liver Function Tests:  Recent  Labs Lab 01/06/15 1804  AST 25  ALT 12*  ALKPHOS 71  BILITOT 0.5  PROT 6.7  ALBUMIN 3.1*   No results for input(s): LIPASE, AMYLASE in the last 168 hours.  Recent Labs Lab 01/06/15 2001  AMMONIA 22    CBC:  Recent Labs Lab 01/06/15 1753 01/06/15 1804 01/06/15 2146  WBC  --  3.6* 4.4  NEUTROABS  --  0.9*  --   HGB 13.6 12.2 11.7*  HCT 40.0 38.9 35.4*  MCV  --  89.0 86.3  PLT  --  325 295    Cardiac Enzymes:  Recent Labs Lab 01/06/15 1742  CKTOTAL 110    Lipid Panel:  Recent Labs Lab 01/07/15 0808  CHOL 162  TRIG 50  HDL 51  CHOLHDL 3.2  VLDL 10  LDLCALC 161101*    CBG:  Recent Labs Lab 01/06/15 1732  GLUCAP 69    Microbiology: Results for orders placed or performed during the hospital encounter of 01/06/15  MRSA PCR Screening     Status: None   Collection Time: 01/08/15  1:36 AM  Result Value Ref Range Status   MRSA by PCR NEGATIVE NEGATIVE Final    Comment:        The GeneXpert MRSA Assay (FDA approved for NASAL specimens only), is one component of a comprehensive MRSA colonization surveillance program. It is not intended to diagnose MRSA infection nor  to guide or monitor treatment for MRSA infections.     Coagulation Studies:  Recent Labs  01/06/15 1804  LABPROT 14.6  INR 1.12    Imaging: Dg Chest 2 View  01/06/2015  CLINICAL DATA:  80 year old female with stroke. Prior history of smoking. EXAM: CHEST  2 VIEW COMPARISON:  Radiograph dated 11/09/2014 FINDINGS: Two views of the chest demonstrate emphysematous changes of the lungs. No focal consolidation, pleural effusion, or pneumothorax. Stable cardiac silhouette. Degenerative changes of the osseous structures and spine. No acute fracture. IMPRESSION: No active cardiopulmonary disease.  No interval change. Electronically Signed   By: Elgie Collard M.D.   On: 01/06/2015 22:21    Medications:  Current facility-administered medications:  .  acetaminophen (TYLENOL) tablet  650 mg, 650 mg, Oral, Q6H PRN, Bobette Mo, MD .  amantadine (SYMMETREL) capsule 100 mg, 100 mg, Oral, BID, Armstead Heiland Daniel Nones, MD .  busPIRone (BUSPAR) tablet 15 mg, 15 mg, Oral, BID, Bobette Mo, MD, 15 mg at 01/08/15 1000 .  carbidopa-levodopa (SINEMET IR) 25-100 MG per tablet immediate release 1 tablet, 1 tablet, Oral, TID, Jolon Degante Daniel Nones, MD .  Melene Muller ON 01/09/2015] Carbidopa-Levodopa ER (SINEMET CR) 25-100 MG tablet controlled release 1 tablet, 1 tablet, Oral, QHS, Kadence Mimbs Daniel Nones, MD .  enoxaparin (LOVENOX) injection 40 mg, 40 mg, Subcutaneous, Q24H, Bobette Mo, MD, 40 mg at 01/08/15 0102 .  feeding supplement (ENSURE ENLIVE) (ENSURE ENLIVE) liquid 237 mL, 237 mL, Oral, BID BM, Bobette Mo, MD, 237 mL at 01/08/15 1333 .  mirtazapine (REMERON) tablet 15 mg, 15 mg, Oral, QHS, Bobette Mo, MD, 15 mg at 01/08/15 0103 .  senna-docusate (Senokot-S) tablet 1 tablet, 1 tablet, Oral, QHS PRN, Bobette Mo, MD .  sodium chloride 0.9 % injection 3 mL, 3 mL, Intravenous, Q12H, Bobette Mo, MD, 3 mL at 01/07/15 0003   Assessment/Plan:  80 year old female patient with clinical examination suggestive of advanced Parkinson's disease, with moderate to severe limb rigidity, severe hypophonia and baseline cognitive impairment. Neurodiagnostic evaluation with a brain MRI and EEG has been negative for any acute pathology. To help with the Parkinson's motor symptoms and to improve her ability to participate in her ADLs, I recommend the following changes to her Parkinson medications, Start regular Sinemet 25/100 one tablet 3 times a day at 7 AM, 12 PM and 5 PM, and Sinemet CR 25/100 one tablet at bedtime.  We'll also add amantadine 100 mg twice a day after breakfast and lunch.  She was previously on Sinemet CR 25/100 one tablet 3 times a day which has been discontinued. Given her age and cognitive impairment, she is at risk for  cognitive and sedative side effects from Sinemet including hallucinations and delirium. If patient has such symptoms, recommend discontinuing Sinemet and continue only amantadine at that time. Neurology service will continue to follow up. Please call for any further questions.

## 2015-01-08 NOTE — Progress Notes (Signed)
VASCULAR LAB PRELIMINARY  PRELIMINARY  PRELIMINARY  PRELIMINARY  Carotid duplex completed.    Preliminary report:  1-39% ICA plaquing.  Vertebral artery flow is antegrade.   Najma Bozarth, RVT 01/08/2015, 10:09 AM

## 2015-01-08 NOTE — Progress Notes (Addendum)
Patient ID: Susan Simmons, female   DOB: 19-Jul-1934, 80 y.o.   MRN: [id number] [facility] PROGRESS NOTE  Susan Simmons WUJ:[id number] DOB: 03/20/1934 DOA: 01/06/2015 PCP: Clayborn Heron, MD  Brief narrative:    80 y.o. female with known past medical history of parkinson's disease, anxiety, depression who presented to [facility] ED and then transferred to [facility] hospital for evaluation of sudden onset of change in mental status for 5 hours prior to this admission and stroke work up. CT head and MRI brain did  Not show acute intracranial findings. PT evaluation is in progress.   Anticipated discharge: To SNF by 01/10/2015.  Assessment/Plan:    Principal Problem:  Acute encephalopathy / Parkinson disease (HCC) - Likely due to parkinson's disease  - CT head nad MRI brian showed no acute intracranial findings - EEG with mld abnormal findings but not specific  - Stroke work up almost complete (2 D ECHO with normal EF), carotid doppler pending but prelim report without significant stenosis  - PT evaluation - SNF placement. - Continue Sinemet  Active Problems:  Anxiety and Depression - Stable - Continue Buspar and Remeron    Pressure ulcer stage II - Per RN dressing care    Malnutrition of moderate degree - SLP done and regular thin diet recommended - In the context of chronic illness  - Continue nutritional supplementation    Leukopenia - Reactive - Resolved   DVT prophylaxis  - Lovenox SubQ ordered   Code Status: Full.  Family Communication:  Family not at the bedside this am  Disposition Plan: Likely to SNF by 01/10/2015  IV access:  Peripheral IV  Procedures and diagnostic studies:    Dg Chest 2 View 01/06/2015  No active cardiopulmonary disease.  No interval change. Electronically Signed   By: Elgie Collard M.D.   On: 01/06/2015 22:21   Ct Head Wo Contrast 01/06/2015   1. No acute intracranial pathology. 2. Chronic microvascular disease and cerebral atrophy.  These results were called by telephone at the time of interpretation on 01/06/2015 at 5:42 pm to Dr. Rolland [facility] , who verbally acknowledged these results. Electronically Signed   By: Elige Ko   On: 01/06/2015 17:43   Mr Brain Wo Contrast 01/06/2015  No acute intracranial abnormality. Minimal to mild for age nonspecific signal changes, most commonly due to chronic small vessel disease. Electronically Signed   By: Odessa Fleming M.D.   On: 01/06/2015 18:43   EEG 01/07/2015 This is a mildly abnormal routine EEG of the awake and drowsy states, without activating procedures, due to mild background slowing.   2 D ECHO 01/07/2015 EF 65% with grade 1 diastolic dysfunction    Medical Consultants:  Neurology   Other Consultants:  PT SLP  IAnti-Infectives:   None    Manson Passey, MD  [facility] Pager [phone]  Time spent in minutes: 25 minutes  If 7PM-7AM, please contact night-coverage [email or web address] Password [email or web address] 01/08/2015, 11:07 AM   LOS: 2 days    HPI/Subjective: No acute overnight events. No respiratory distress.   Objective: Filed Vitals:   01/07/15 1704 01/07/15 2301 01/08/15 0235 01/08/15 0705  BP: 98/41 109/42 114/47 140/61  Pulse: 70 73 75 74  Temp: 98.4 F (36.9 C) 98.2 F (36.8 C) 98.2 F (36.8 C) 98.4 F (36.9 C)  TempSrc: Oral Oral Oral Oral  Resp: Height:      Weight:      SpO2: 100% 100% 99% 100%  Intake/Output Summary (Last 24 hours) at 01/08/15 1107 Last data filed at 01/07/15 2303  Gross per 24 hour  Intake    360 ml  Output      0 ml  Net    360 ml    Exam:   General:  Pt is alert, not in acute distress  Cardiovascular: Regular rate and rhythm, S1/S2 appreciated   Respiratory: Clear to auscultation bilaterally, no wheezing, no crackles, no rhonchi  Abdomen: Soft, non tender, non distended, bowel sounds present  Extremities: No edema, pulses DP and PT palpable bilaterally  Neuro: Grossly nonfocal  Data Reviewed: Basic  Metabolic Panel:  Recent Labs Lab 01/06/15 1753 01/06/15 1804 01/06/15 2146  NA 141 138  --   K 4.2 4.1  --   CL 103 105  --   CO2  --  25  --   GLUCOSE 85 89  --   BUN 16 16  --   CREATININE 0.80 0.85 0.79  CALCIUM  --  9.1  --    Liver Function Tests:  Recent Labs Lab 01/06/15 1804  AST 25  ALT 12*  ALKPHOS 71  BILITOT 0.5  PROT 6.7  ALBUMIN 3.1*   No results for input(s): LIPASE, AMYLASE in the last 168 hours.  Recent Labs Lab 01/06/15 2001  AMMONIA 22   CBC:  Recent Labs Lab 01/06/15 1753 01/06/15 1804 01/06/15 2146  WBC  --  3.6* 4.4  NEUTROABS  --  0.9*  --   HGB 13.6 12.2 11.7*  HCT 40.0 38.9 35.4*  MCV  --  89.0 86.3  PLT  --  325 295   Cardiac Enzymes:  Recent Labs Lab 01/06/15 1742  CKTOTAL 110   BNP: Invalid input(s): POCBNP CBG:  Recent Labs Lab 01/06/15 1732  GLUCAP 69    Recent Results (from the past 240 hour(s))  MRSA PCR Screening     Status: None   Collection Time: 01/08/15  1:36 AM  Result Value Ref Range Status   MRSA by PCR NEGATIVE NEGATIVE Final    Comment:        The GeneXpert MRSA Assay (FDA approved for NASAL specimens only), is one component of a comprehensive MRSA colonization surveillance program. It is not intended to diagnose MRSA infection nor to guide or monitor treatment for MRSA infections.      Scheduled Meds: . busPIRone  15 mg Oral BID  . Carbidopa-Levodopa ER  1 tablet Oral TID  . enoxaparin (LOVENOX) injection  40 mg Subcutaneous Q24H  . feeding supplement (ENSURE ENLIVE)  237 mL Oral BID BM  . mirtazapine  15 mg Oral QHS  . sodium chloride  3 mL Intravenous Q12H   Continuous Infusions:

## 2015-01-09 DIAGNOSIS — R269 Unspecified abnormalities of gait and mobility: Secondary | ICD-10-CM

## 2015-01-09 MED ORDER — CARBIDOPA-LEVODOPA 25-100 MG PO TABS: 1.0000 | ORAL_TABLET | Freq: Three times a day (TID) | ORAL | Status: DC

## 2015-01-09 MED ORDER — CARBIDOPA-LEVODOPA ER 25-100 MG PO TBCR: 1.0000 | EXTENDED_RELEASE_TABLET | Freq: Every day | ORAL | Status: DC

## 2015-01-09 MED ORDER — AMANTADINE HCL 100 MG PO CAPS: 100.0000 mg | ORAL_CAPSULE | Freq: Two times a day (BID) | ORAL | Status: DC

## 2015-01-09 NOTE — Discharge Instructions (Signed)
Carbidopa; Levodopa extended-release capsules What is this medicine? CARBIDOPA;LEVODOPA (kar bi DOE pa; lee voe DOE pa) is used to treat the symptoms of Parkinson's disease. This medicine may be used for other purposes; ask your health care provider or pharmacist if you have questions. What should I tell my health care provider before I take this medicine? They need to know if you have any of these conditions: -depression or other mental illness -diabetes -glaucoma -heart disease, including recent heart attack -history of irregular heart beat -kidney disease -liver disease -lung or breathing disease, like asthma -melanoma or suspicious skin lesions -stomach or intestine problems -an unusual or allergic reaction to levodopa, carbidopa, other medicines, foods, dyes, or preservatives -pregnant or trying to get pregnant -breast-feeding How should I use this medicine? Take this medicine by mouth with a glass of water. Follow the directions on the prescription label. Swallow whole. Do not crush, chew, or divide the capsules. If you have trouble swallowing, you may open the capsule and sprinkle the entire contents on 1 to 2 tablespoons of applesauce. Take the medicine/food mixture immediately, and do not store for future use. Take your doses at regular intervals. Do not take your medicine more often than directed. Do not stop taking except on the advice of your doctor or health care professional. A high fat, high calorie meal may slow the absorption of the medicine into your system and delay the onset of action by 2 to 3 hours. Consider taking the first dose of the day 1 to 2 hours before eating. If you develop nausea, the medicine may be taken with food. Talk to your pediatrician regarding the use of this medicine in children. Special care may be needed. Overdosage: If you think you have taken too much of this medicine contact a poison control center or emergency room at once. NOTE: This medicine  is only for you. Do not share this medicine with others. What if I miss a dose? If you miss a dose, take it as soon as you can. If it is almost time for your next dose, take only that dose. Do not take double or extra doses. What may interact with this medicine? Do not take this medicine with any of the following medications: -MAOIs like Marplan, Nardil, and Parnate -reserpine -tetrabenazine This medicine may also interact with the following medications: -alcohol -droperidol -entacapone -iron supplements or multivitamins with iron -isoniazid, INH -linezolid -medicines for depression, anxiety, or psychotic disturbances -medicines for high blood pressure -medicines for sleep -metoclopramide -papaverine -procarbazine -tedizolid -rasagiline -selegiline -tolcapone This list may not describe all possible interactions. Give your health care provider a list of all the medicines, herbs, non-prescription drugs, or dietary supplements you use. Also tell them if you smoke, drink alcohol, or use illegal drugs. Some items may interact with your medicine. What should I watch for while using this medicine? Visit your doctor or health care professional for regular checks on your progress. It may be several weeks or months before you feel the full benefits of this medicine. Continue to take your medicine on a regular schedule. Do not take any additional medicines for Parkinson's disease without first consulting with your health care provider. You may get drowsy or dizzy. Do not drive, use machinery, or do anything that needs mental alertness until you know how this drug affects you. Do not stand or sit up quickly, especially if you are an older patient. This reduces the risk of dizzy or fainting spells. Alcohol can make you more  drowsy and dizzy. Avoid alcoholic drinks. If you find that you have sudden feelings of wanting to sleep during normal activities, like cooking, watching television, or while  driving or riding in a car, you should contact your health care professional. Bonita QuinYou may experience a 'wearing off' effect prior to the time for your next dose of this medicine. You may also experience an 'on-off' effect where the medicine apparently stops working for any time from a minute to several hours, then suddenly starts working again. Tell your doctor or health care professional if any of these symptoms happen to you. Your dose may need adjustment. A high protein meal can slow or prevent absorption of this medicine. Avoid high protein foods near the time of taking this medicine to help prevent these problems. Take this medicine at least 30 minutes before eating or one hour after meals. You may want to eat higher protein foods later in the day or in small amounts. If you have diabetes, you may get a false-positive result for sugar in your urine. Check with your doctor or health care professional. This medicine may discolor the urine or sweat, making it look darker or red in color. This is of no cause for concern. However, this may stain clothing or fabrics. There have been reports of increased sexual urges or other strong urges such as gambling while taking some medicines for Parkinson's disease. If you experience any of these urges while taking this medicine, you should report it to your health care provider as soon as possible. You should check your skin often for changes to moles and new growths while taking this medicine. Call your doctor if you notice any of these changes. What side effects may I notice from receiving this medicine? Side effects that you should report to your doctor or health care professional as soon as possible: -allergic reactions like skin rash, itching or hives, swelling of the face, lips, or tongue -anxiety, confusion, or nervousness -falling asleep during normal activities like driving -fast, irregular heartbeat -hallucination, loss of contact with reality -mood  changes like aggressive behavior, depression -stomach pain -trouble passing urine -uncontrolled movements of the mouth, head, hands, feet, shoulders, eyelids or other unusual muscle movements Side effects that usually do not require medical attention (Report these to your doctor or health care professional if they continue or are bothersome.): -headache -loss of appetite -muscle twitches -nausea/vomiting -nightmares, trouble sleeping This list may not describe all possible side effects. Call your doctor for medical advice about side effects. You may report side effects to FDA at 1-800-FDA-1088. Where should I keep my medicine? Keep out of the reach of children. Store at room temperature between 15 and 30 degrees C (59 and 86 degrees F). Protect from light and moisture. Throw away any unused medicine after the expiration date. NOTE: This sheet is a summary. It may not cover all possible information. If you have questions about this medicine, talk to your doctor, pharmacist, or health care provider.    2016, Elsevier/Gold Standard. (2013-03-20 13:32:33) Amantadine capsules or tablets What is this medicine? AMANTADINE (a MAN ta deen) is an antiviral medicine. It is used to prevent and to treat a specific type of flu called influenza A. It will not work for colds, other types of flu, or other viral infections. This medicine is also used to treat Parkinson's disease and other movement disorders. This medicine may be used for other purposes; ask your health care provider or pharmacist if you have questions. What should  I tell my health care provider before I take this medicine? They need to know if you have any of these conditions: -glaucoma -depression or other mental illness -eczema -heart failure or circulation problems -kidney disease -liver disease -seizures -an unusual or allergic reaction to amantadine, rimantadine, other medicines, foods, dyes, or preservatives -pregnant or trying  to get pregnant -breast-feeding How should I use this medicine? Take this medicine by mouth with a full glass of water. Follow the directions on the prescription label. Take your medicine at regular intervals. Do not take your medicine more often than directed. Take all of your medicine as directed even if you think your are better. Do not skip doses or stop your medicine early. Contact your pediatrician or health care professional regarding the useof this medicine in children. While this drug may be prescribed for children as young as 70 year old for selected conditions, precautions do apply. Patients over 26 years old may have a stronger reaction and need a smaller dose. Overdosage: If you think you have taken too much of this medicine contact a poison control center or emergency room at once. NOTE: This medicine is only for you. Do not share this medicine with others. What if I miss a dose? If you miss a dose, take it as soon as you can. If it is almost time for your next dose, take only that dose. Do not take double or extra doses. What may interact with this medicine? -alcohol -bupropion -quinidine -quinine -some diuretics -some flu vaccines -some medicines for cold or allergies -stimulants -sulfamethoxazole; trimethoprim -thioridazine This list may not describe all possible interactions. Give your health care provider a list of all the medicines, herbs, non-prescription drugs, or dietary supplements you use. Also tell them if you smoke, drink alcohol, or use illegal drugs. Some items may interact with your medicine. What should I watch for while using this medicine? Tell your doctor or health care professional if your symptoms do not improve. You may get drowsy or dizzy. Do not drive, use machinery, or do anything that needs mental alertness until you know how this medicine affects you. Do not stand or sit up quickly, especially if you are an older patient. This reduces the risk of dizzy  or fainting spells. Alcohol may interfere with the effect of this medicine. Avoid alcoholic drinks. If you are taking this medicine for Parkinson's disease or a movement disorder, be careful. Slowly increase your daily activities as your condition improves. Do not suddenly stop taking your medicine because you may develop a severe reaction. You may get dry mouth or eyes, or blurry vision while taking this medicine. Try sugarless gum or hard candy, and drink 6 to 8 glasses of water daily. Brush and floss your teeth regularly and carefully to avoid teeth and gum problems. You may want to wet your eyes with lubricating eye drops. Talk to your doctor if these symptoms become a problem. There have been reports of increased sexual urges or other strong urges such as gambling while taking some medicines for Parkinson's disease. If you experience any of these urges while taking this medicine, you should report it to your health care provider as soon as possible. You should check your skin often for changes to moles and new growths while taking this medicine. Call your doctor if you notice any of these changes. What side effects may I notice from receiving this medicine? Side effects that you should report to your doctor or health care professional as  soon as possible: -allergic reactions like skin rash, itching or hives, swelling of the face, lips, or tongue -breathing problems -changes in vision -depression, mood changes -difficulty passing urine -feeling faint or lightheaded -fever -hallucinations -irregular, fast heartbeat -mouth sores -seizures -swelling of the hands or feet -unusual stiffness, tremors Side effects that usually do not require medical attention (report to your doctor or health care professional if they continue or are bothersome): -anxiety, irritable, nervous -constipation or diarrhea -loss of appetite -nausea -trouble sleeping -unusually tired This list may not describe all  possible side effects. Call your doctor for medical advice about side effects. You may report side effects to FDA at 1-800-FDA-1088. Where should I keep my medicine? Keep out of the reach of children. Store at room temperature between 20 and 25 degrees C (68 and 77 degrees F). Keep container tightly closed. Throw away any unused medicine after the expiration date. NOTE: This sheet is a summary. It may not cover all possible information. If you have questions about this medicine, talk to your doctor, pharmacist, or health care provider.    2016, Elsevier/Gold Standard. (2007-04-15 11:39:33)

## 2015-01-09 NOTE — Progress Notes (Signed)
Subjective: Patient with advanced Parkinson's disease.   changed her Parkinson medication regimen yesterday as detailed below.  She is noted to be more alert, able to use her arms to feed herself. Her daughter was very happy with the response to treatment. No new neurological symptoms. No side effects from the Parkinson medications.   Current facility-administered medications:  .  acetaminophen (TYLENOL) tablet 650 mg, 650 mg, Oral, Q6H PRN, Bobette Moavid Manuel Ortiz, MD .  amantadine (SYMMETREL) capsule 100 mg, 100 mg, Oral, BID, Teofila Bowery Daniel NonesNarayan Kaveer Camdin Hegner, MD, 100 mg at 01/09/15 1132 .  busPIRone (BUSPAR) tablet 15 mg, 15 mg, Oral, BID, Bobette Moavid Manuel Ortiz, MD, 15 mg at 01/09/15 1133 .  carbidopa-levodopa (SINEMET IR) 25-100 MG per tablet immediate release 1 tablet, 1 tablet, Oral, TID, Jann Milkovich Daniel NonesNarayan Kaveer Yaniah Thiemann, MD, 1 tablet at 01/09/15 1700 .  Carbidopa-Levodopa ER (SINEMET CR) 25-100 MG tablet controlled release 1 tablet, 1 tablet, Oral, QHS, Anden Bartolo Daniel NonesNarayan Kaveer Issai Werling, MD .  feeding supplement (ENSURE ENLIVE) (ENSURE ENLIVE) liquid 237 mL, 237 mL, Oral, BID BM, Bobette Moavid Manuel Ortiz, MD, 237 mL at 01/09/15 1407 .  mirtazapine (REMERON) tablet 15 mg, 15 mg, Oral, QHS, Bobette Moavid Manuel Ortiz, MD, 15 mg at 01/08/15 0103 .  senna-docusate (Senokot-S) tablet 1 tablet, 1 tablet, Oral, QHS PRN, Bobette Moavid Manuel Ortiz, MD .  sodium chloride 0.9 % injection 3 mL, 3 mL, Intravenous, Q12H, Bobette Moavid Manuel Ortiz, MD, 3 mL at 01/08/15 2124   Objective: Current vital signs: BP 144/46 mmHg  Pulse 72  Temp(Src) 98.7 F (37.1 C) (Oral)  Resp 18  Ht 5\' 4"  (1.626 m)  Wt 52.9 kg (116 lb 10 oz)  BMI 20.01 kg/m2  SpO2 100% Vital signs in last 24 hours: Temp:  [98 F (36.7 C)-98.7 F (37.1 C)] 98.7 F (37.1 C) (12/04 1758) Pulse Rate:  [70-73] 72 (12/04 1758) Resp:  [16-18] 18 (12/04 1758) BP: (105-144)/(35-55) 144/46 mmHg (12/04 1758) SpO2:  [100 %] 100 % (12/04 1758)  Intake/Output from previous day: 12/03  0701 - 12/04 0700 In: 120 [P.O.:120] Out: -  Intake/Output this shift:   Nutritional status: Diet regular Room service appropriate?: Yes; Fluid consistency:: Thin Diet - low sodium heart healthy  Neurologic Exam:  alert, oriented 4. Hypophonia has improved. Non-dysarthric speech, no aphasia.  Cranial nerve II-12 grossly intact Improved rigidity, although continues to have mild rigidity in bilateral upper and lower extremities. Able to perspective examination more easily, sustain antigravity strength in bilateral upper and lower extremity, able to demonstrate full strength throughout.  No appendicular ataxia. Continues to have mild residual resting tremor bilaterally.      Results: Basic Metabolic Panel:  Recent Labs Lab 01/06/15 1753 01/06/15 1804 01/06/15 2146  NA 141 138  --   K 4.2 4.1  --   CL 103 105  --   CO2  --  25  --   GLUCOSE 85 89  --   BUN 16 16  --   CREATININE 0.80 0.85 0.79  CALCIUM  --  9.1  --     Liver Function Tests:  Recent Labs Lab 01/06/15 1804  AST 25  ALT 12*  ALKPHOS 71  BILITOT 0.5  PROT 6.7  ALBUMIN 3.1*   No results for input(s): LIPASE, AMYLASE in the last 168 hours.  Recent Labs Lab 01/06/15 2001  AMMONIA 22    CBC:  Recent Labs Lab 01/06/15 1753 01/06/15 1804 01/06/15 2146  WBC  --  3.6* 4.4  NEUTROABS  --  0.9*  --   HGB 13.6 12.2 11.7*  HCT 40.0 38.9 35.4*  MCV  --  89.0 86.3  PLT  --  325 295    Cardiac Enzymes:  Recent Labs Lab 01/06/15 1742  CKTOTAL 110    Lipid Panel:  Recent Labs Lab 01/07/15 0808  CHOL 162  TRIG 50  HDL 51  CHOLHDL 3.2  VLDL 10  LDLCALC 161*    CBG:  Recent Labs Lab 01/06/15 1732  GLUCAP 69    Microbiology: Results for orders placed or performed during the hospital encounter of 01/06/15  MRSA PCR Screening     Status: None   Collection Time: 01/08/15  1:36 AM  Result Value Ref Range Status   MRSA by PCR NEGATIVE NEGATIVE Final    Comment:        The  GeneXpert MRSA Assay (FDA approved for NASAL specimens only), is one component of a comprehensive MRSA colonization surveillance program. It is not intended to diagnose MRSA infection nor to guide or monitor treatment for MRSA infections.     Coagulation Studies: No results for input(s): LABPROT, INR in the last 72 hours.  Imaging: No results found.    Assessment/Plan:  80 year old female patient with advanced Parkinson's disease, with significant improvement in her Parkinson symptoms with the current medication regimen consisting of amantadine (SYMMETREL) capsule 100 mg, 100 mg, Oral, BID; carbidopa-levodopa (SINEMET IR) 25-100 MG per tablet immediate release 1 tablet, 1 tablet, Oral, TID, ; Carbidopa-Levodopa ER (SINEMET CR) 25-100 MG tablet controlled release 1 tablet, 1 tablet, Oral, QHS,. No side effects from these medications. Recommend to continue the same doses.  Recommend physical therapy to ambulate her tomorrow using a walker. Can be discharged to a skilled nursing facility for continued therapy, based on physical therapy recommendations.  Neurology service will sign off. please call for any further questions

## 2015-01-09 NOTE — Progress Notes (Signed)
Daughter came in to visit and pt agreed to taking medication with applesauce. No complications. No noted distress. Will continue to monitor.

## 2015-01-09 NOTE — Discharge Summary (Signed)
Physician Discharge Summary  Susan GasserShirley Simmons ZOX:[id number] DOB: 1934-07-31 DOA: 01/06/2015  PCP: Clayborn HeronVictoria R Rankins, MD  Admit date: 01/06/2015 Discharge date: 01/09/2015  Recommendations for Outpatient Follow-up:  To help with the Parkinson's motor symptoms and to improve the ability to participate in ADLs, I neurology recommended the following changes to patient's Parkinson medications, Start regular Sinemet 25/100 one tablet 3 times a day at 7 AM, 12 PM and 5 PM, and Sinemet CR 25/100 one tablet at bedtime.  We also added amantadine 100 mg twice a day after breakfast and lunch.  She was previously on Sinemet CR 25/100 one tablet 3 times a day which has been discontinued. Given her age and cognitive impairment, she is at risk for cognitive and sedative side effects from Sinemet including hallucinations and delirium. If patient has such symptoms, recommend discontinuing Sinemet and continue only amantadine at that time.  Discharge Diagnoses:  Principal Problem:   Acute encephalopathy Active Problems:   Parkinson disease (HCC)   Depression   Pressure ulcer stage II   Malnutrition of moderate degree   Anxiety   Leukopenia    Discharge Condition: stable   Diet recommendation: as tolerated   History of present illness:  80 y.o. female with known past medical history of parkinson's disease, anxiety, depression who [facility] for evaluation of sudden onset of change in mental status for 5 hours prior to this admission and stroke work up. CT head and MRI brain didnot show acute intracranial findings.   Hospital Course:  Assessment/Plan:    Principal Problem:  Acute encephalopathy / Parkinson disease (HCC) - Likely due to parkinson's disease - Please note above changes in sinemet recommended by neurology - Please addition of amantadine  - CT head nad MRI brian showed no acute intracranial findings - EEG with mld abnormal findings  but not specific  - Stroke work up complete (2 D ECHO with normal EF), carotid doppler without significant stenosis  - PT evaluation - SNF placement. Appreciate SW assisting discharge plan   Active Problems:  Anxiety and Depression - Stable - Continue Buspar and Remeron    Pressure ulcer stage II - Per RN dressing care    Malnutrition of moderate degree - In the context of chronic illness  - Continue nutritional supplementation  - Regular diet per SLP evaluation    Leukopenia - Reactive - Resolved   DVT prophylaxis  - Lovenox SubQ   Code Status: Full.  Family Communication: Family not at the bedside this am   IV access:  Peripheral IV  Procedures and diagnostic studies:   Dg Chest 2 View 01/06/2015 No active cardiopulmonary disease. No interval change. Electronically Signed By: Elgie CollardArash Radparvar M.D. On: 01/06/2015 22:21   Ct Head Wo Contrast 01/06/2015 1. No acute intracranial pathology. 2. Chronic microvascular disease and cerebral atrophy. These results were called by telephone at the time of interpretation on 01/06/2015 at 5:42 pm to Dr. Rolland PorterMARK JAMES , who verbally acknowledged these results. Electronically Signed By: Elige KoHetal Patel On: 01/06/2015 17:43   Mr Brain Wo Contrast 01/06/2015 No acute intracranial abnormality. Minimal to mild for age nonspecific signal changes, most commonly due to chronic small vessel disease. Electronically Signed By: Odessa FlemingH Hall M.D. On: 01/06/2015 18:43   EEG 01/07/2015 This is a mildly abnormal routine EEG of the awake and drowsy states, without activating procedures, due to mild background slowing.   2 D ECHO 01/07/2015 EF 65% with grade 1 diastolic dysfunction  Medical Consultants:  Neurology   Other Consultants:  PT SLP  IAnti-Infectives:   None   Signed:  Manson Passey, MD  [facility] 01/09/2015, 11:50 AM  Pager #: [phone]  Time spent in minutes: more than 30  minutes   Discharge Exam: Filed Vitals:   01/09/15 0525 01/09/15 0947  BP: 129/55 105/50  Pulse: 70 72  Temp: 98.1 F (36.7 C) 98.7 F (37.1 C)  Resp: 18 18   Filed Vitals:   01/08/15 2154 01/09/15 0105 01/09/15 0525 01/09/15 0947  BP: 112/43 108/44 129/55 105/50  Pulse: 70 73 70 72  Temp: 98.2 F (36.8 C) 98 F (36.7 C) 98.1 F (36.7 C) 98.7 F (37.1 C)  TempSrc: Oral Oral Oral Oral  Resp: Height:      Weight:      SpO2: 100% 100% 100% 100%    General: Pt is not in acute distress Cardiovascular: Regular rate and rhythm, S1/S2 + Respiratory: Clear to auscultation bilaterally, no wheezing, no crackles, no rhonchi Abdominal: Soft, non tender, non distended, bowel sounds +, no guarding Extremities: no edema, no cyanosis, pulses palpable bilaterally DP and PT Neuro: Grossly nonfocal  Discharge Instructions  Discharge Instructions    Call MD for:  difficulty breathing, headache or visual disturbances    Complete by:  As directed      Call MD for:  persistant dizziness or light-headedness    Complete by:  As directed      Call MD for:  persistant nausea and vomiting    Complete by:  As directed      Call MD for:  severe uncontrolled pain    Complete by:  As directed      Diet - low sodium heart healthy    Complete by:  As directed      Discharge instructions    Complete by:  As directed   To help with the Parkinson's motor symptoms and to improve the ability to participate in ADLs, I neurology recommended the following changes to patient's Parkinson medications, Start regular Sinemet 25/100 one tablet 3 times a day at 7 AM, 12 PM and 5 PM, and Sinemet CR 25/100 one tablet at bedtime.  We also added amantadine 100 mg twice a day after breakfast and lunch.  She was previously on Sinemet CR 25/100 one tablet 3 times a day which has been discontinued. Given her age and cognitive impairment, she is at risk for cognitive and sedative side effects from Sinemet  including hallucinations and delirium. If patient has such symptoms, recommend discontinuing Sinemet and continue only amantadine at that time.     Increase activity slowly    Complete by:  As directed             Medication List    TAKE these medications        acetaminophen 325 MG tablet  Commonly known as:  TYLENOL  Take 2 tablets (650 mg total) by mouth every 6 (six) hours as needed for mild pain (or Fever >/= 101).     amantadine 100 MG capsule  Commonly known as:  SYMMETREL  Take 1 capsule (100 mg total) by mouth 2 (two) times daily.     busPIRone 15 MG tablet  Commonly known as:  BUSPAR  Take 15 mg by mouth 2 (two) times daily.     carbidopa-levodopa 25-100 MG tablet  Commonly known as:  SINEMET IR  Take 1 tablet by mouth 3 (three) times  daily.     Carbidopa-Levodopa ER 25-100 MG tablet controlled release  Commonly known as:  SINEMET CR  Take 1 tablet by mouth at bedtime.     feeding supplement (ENSURE ENLIVE) Liqd  Take 237 mLs by mouth 2 (two) times daily between meals.     mirtazapine 15 MG tablet  Commonly known as:  REMERON  Take 15 mg by mouth at bedtime.           Follow-up Information    Follow up with Clayborn Heron, MD. Schedule an appointment as soon as possible for a visit in 1 week.   Specialty:  Family Medicine   Why:  Follow up appt after recent hospitalization   Contact information:   [address] [place] [address] [phone]        The results of significant diagnostics from this hospitalization (including imaging, microbiology, ancillary and laboratory) are listed below for reference.    Significant Diagnostic Studies: Dg Chest 2 View  01/06/2015  CLINICAL DATA:  80 year old female with stroke. Prior history of smoking. EXAM: CHEST  2 VIEW COMPARISON:  Radiograph dated 11/09/2014 FINDINGS: Two views of the chest demonstrate emphysematous changes of the lungs. No focal consolidation, pleural effusion, or pneumothorax.  Stable cardiac silhouette. Degenerative changes of the osseous structures and spine. No acute fracture. IMPRESSION: No active cardiopulmonary disease.  No interval change. Electronically Signed   By: Elgie Collard M.D.   On: 01/06/2015 22:21   Ct Head Wo Contrast  01/06/2015  CLINICAL DATA:  Code stroke. Not answering questions or following commands. EXAM: CT HEAD WITHOUT CONTRAST TECHNIQUE: Contiguous axial images were obtained from the base of the skull through the vertex without intravenous contrast. COMPARISON:  11/09/2014 FINDINGS: There is no evidence of mass effect, midline shift, or extra-axial fluid collections. There is no evidence of a space-occupying lesion or intracranial hemorrhage. There is no evidence of a cortical-based area of acute infarction. There is generalized cerebral atrophy. There is periventricular white matter low attenuation likely secondary to microangiopathy. The ventricles and sulci are appropriate for the patient's age. The basal cisterns are patent. Visualized portions of the orbits are unremarkable. The visualized portions of the paranasal sinuses and mastoid air cells are unremarkable. The osseous structures are unremarkable. IMPRESSION: 1. No acute intracranial pathology. 2. Chronic microvascular disease and cerebral atrophy. These results were called by telephone at the time of interpretation on 01/06/2015 at 5:42 pm to Dr. Rolland Porter , who verbally acknowledged these results. Electronically Signed   By: Elige Ko   On: 01/06/2015 17:43   Mr Brain Wo Contrast  01/06/2015  CLINICAL DATA:  80 year old female is unresponsive. Aphasia. Code stroke. Initial encounter. EXAM: MRI HEAD WITHOUT CONTRAST TECHNIQUE: Multiplanar, multiecho pulse sequences of the brain and surrounding structures were obtained without intravenous contrast. COMPARISON:  Head CT without contrast 1737 hours today, and earlier. FINDINGS: No restricted diffusion or evidence of acute infarction. Major  intracranial vascular flow voids are within normal limits. No midline shift, mass effect, evidence of mass lesion, ventriculomegaly, extra-axial collection or acute intracranial hemorrhage. Cervicomedullary junction and pituitary are within normal limits. Negative visualized cervical spine. Supratentorial gray and white matter signal is normal for age. No cortical encephalomalacia or chronic cerebral blood products identified. There is minimal to mild patchy T2 hyperintensity in the pons, nonspecific. Cerebellum and deep gray matter are within normal limits. Mastoids and paranasal sinuses are clear. Negative orbit and scalp soft tissues. Normal bone marrow signal. IMPRESSION: No  acute intracranial abnormality. Minimal to mild for age nonspecific signal changes, most commonly due to chronic small vessel disease. Electronically Signed   By: Odessa Fleming M.D.   On: 01/06/2015 18:43    Microbiology: Recent Results (from the past 240 hour(s))  MRSA PCR Screening     Status: None   Collection Time: 01/08/15  1:36 AM  Result Value Ref Range Status   MRSA by PCR NEGATIVE NEGATIVE Final    Comment:        The GeneXpert MRSA Assay (FDA approved for NASAL specimens only), is one component of a comprehensive MRSA colonization surveillance program. It is not intended to diagnose MRSA infection nor to guide or monitor treatment for MRSA infections.      Labs: Basic Metabolic Panel:  Recent Labs Lab 01/06/15 1753 01/06/15 1804 01/06/15 2146  NA 141 138  --   K 4.2 4.1  --   CL 103 105  --   CO2  --  25  --   GLUCOSE 85 89  --   BUN 16 16  --   CREATININE 0.80 0.85 0.79  CALCIUM  --  9.1  --    Liver Function Tests:  Recent Labs Lab 01/06/15 1804  AST 25  ALT 12*  ALKPHOS 71  BILITOT 0.5  PROT 6.7  ALBUMIN 3.1*   No results for input(s): LIPASE, AMYLASE in the last 168 hours.  Recent Labs Lab 01/06/15 2001  AMMONIA 22   CBC:  Recent Labs Lab 01/06/15 1753 01/06/15 1804  01/06/15 2146  WBC  --  3.6* 4.4  NEUTROABS  --  0.9*  --   HGB 13.6 12.2 11.7*  HCT 40.0 38.9 35.4*  MCV  --  89.0 86.3  PLT  --  325 295   Cardiac Enzymes:  Recent Labs Lab 01/06/15 1742  CKTOTAL 110   BNP: BNP (last 3 results) No results for input(s): BNP in the last 8760 hours.  ProBNP (last 3 results) No results for input(s): PROBNP in the last 8760 hours.  CBG:  Recent Labs Lab 01/06/15 1732  GLUCAP 69

## 2015-01-09 NOTE — Progress Notes (Signed)
Pt noted with increased confusion this am. Pt refusing to take medication. Called daughter and RP to speak with pt but she continued to refuse. Md called and notified.

## 2015-01-10 DIAGNOSIS — G9340 Encephalopathy, unspecified: Secondary | ICD-10-CM

## 2015-01-10 MED ORDER — MUPIROCIN CALCIUM 2 % EX CREA
TOPICAL_CREAM | Freq: Two times a day (BID) | CUTANEOUS | Status: DC
  Filled 2015-01-10: qty 15

## 2015-01-10 NOTE — Discharge Summary (Signed)
Physician Discharge Summary  Susan GasserShirley Simmons ZOX:[id number] DOB: [birth year] DOA: 01/06/2015  PCP: Clayborn HeronVictoria R Rankins, MD  Admit date: 01/06/2015 Discharge date: 01/10/2015  Recommendations for Outpatient Follow-up:  To help with the Parkinson's motor symptoms and to improve the ability to participate in ADLs, neurology recommended the following changes to patient's Parkinson medications, Start regular Sinemet 25/100 one tablet 3 times a day at 7 AM, 12 PM and 5 PM, and Sinemet CR 25/100 one tablet at bedtime.  We also added amantadine 100 mg twice a day after breakfast and lunch.  She was previously on Sinemet CR 25/100 one tablet 3 times a day which has been discontinued. Given her age and cognitive impairment, she is at risk for cognitive and sedative side effects from Sinemet including hallucinations and delirium. If patient has such symptoms, recommend discontinuing Sinemet and continue only amantadine at that time.  Discharge Diagnoses:  Principal Problem:   Acute encephalopathy Active Problems:   Depression   Pressure ulcer stage II   Malnutrition of moderate degree   Parkinson disease (HCC)   Anxiety   Leukopenia   Abnormality of gait    Discharge Condition: stable   Diet recommendation: as tolerated   History of present illness:  80 y.o. female with known past medical history of parkinson's disease, anxiety, depression who [facility] and [facility] for evaluation of sudden onset of change in mental status for 5 hours prior to this admission and stroke work up. CT head and MRI brain didnot show acute intracranial findings.   Hospital Course:  Assessment/Plan:    Principal Problem:  Acute encephalopathy / Parkinson disease (HCC) - Likely due to parkinson's disease - Please note above changes in sinemet recommended by neurology - Please addition of amantadine  - CT head nad MRI brian showed no acute intracranial findings - EEG with  mld abnormal findings but not specific  - Stroke work up complete (2 D ECHO with normal EF), carotid doppler without significant stenosis  - PT evaluation - SNF placement. Appreciate SW assisting discharge plan   Active Problems:  Anxiety and Depression - Stable - Continue Buspar and Remeron    Pressure ulcer stage II - Per RN dressing care    Malnutrition of moderate degree - In the context of chronic illness  - Continue nutritional supplementation  - Regular diet per SLP evaluation    Leukopenia - Reactive - Resolved    Procedures and diagnostic studies:   Dg Chest 2 View 01/06/2015 No active cardiopulmonary disease. No interval change. Electronically Signed By: Elgie CollardArash Radparvar M.D. On: 01/06/2015 22:21   Ct Head Wo Contrast 01/06/2015 1. No acute intracranial pathology. 2. Chronic microvascular disease and cerebral atrophy. These results were called by telephone at the time of interpretation on 01/06/2015 at 5:42 pm to Dr. Rolland PorterMARK JAMES , who verbally acknowledged these results. Electronically Signed By: Elige KoHetal Patel On: 01/06/2015 17:43   Mr Brain Wo Contrast 01/06/2015 No acute intracranial abnormality. Minimal to mild for age nonspecific signal changes, most commonly due to chronic small vessel disease. Electronically Signed By: Odessa FlemingH Hall M.D. On: 01/06/2015 18:43   EEG 01/07/2015 This is a mildly abnormal routine EEG of the awake and drowsy states, without activating procedures, due to mild background slowing.   2 D ECHO 01/07/2015 EF 65% with grade 1 diastolic dysfunction    Medical Consultants:  Neurology   Other Consultants:  PT SLP  IAnti-Infectives:   None   Signed:  Myliah Medel UPCHURCH  Shizuko Wojdyla, DO [facility] 01/10/2015, 8:02 AM    Time spent in minutes: more than 30 minutes   Discharge Exam: Filed Vitals:   01/10/15 0100 01/10/15 0514  BP: 100/48 107/48  Pulse: 70 61  Temp: 98.6 F (37 C) 98.6 F (37 C)  Resp: 16  17   Filed Vitals:   01/09/15 1758 01/09/15 2125 01/10/15 0100 01/10/15 0514  BP: 144/46 109/46 100/48 107/48  Pulse: 72  70 61  Temp: 98.7 F (37.1 C) 99.5 F (37.5 C) 98.6 F (37 C) 98.6 F (37 C)  TempSrc: Oral Oral Oral Oral  Resp: 18 17 16 17   Height:      Weight:      SpO2: 100% 100% 100% 100%     Discharge Instructions  Discharge Instructions    Call MD for:  difficulty breathing, headache or visual disturbances    Complete by:  As directed      Call MD for:  persistant dizziness or light-headedness    Complete by:  As directed      Call MD for:  persistant nausea and vomiting    Complete by:  As directed      Call MD for:  severe uncontrolled pain    Complete by:  As directed      Diet - low sodium heart healthy    Complete by:  As directed      Discharge instructions    Complete by:  As directed   To help with the Parkinson's motor symptoms and to improve the ability to participate in ADLs, neurology recommended the following changes to patient's Parkinson medications, Start regular Sinemet 25/100 one tablet 3 times a day at 7 AM, 12 PM and 5 PM, and Sinemet CR 25/100 one tablet at bedtime.  We also added amantadine 100 mg twice a day after breakfast and lunch.  She was previously on Sinemet CR 25/100 one tablet 3 times a day which has been discontinued. Given her age and cognitive impairment, she is at risk for cognitive and sedative side effects from Sinemet including hallucinations and delirium. If patient has such symptoms, recommend discontinuing Sinemet and continue only amantadine at that time.     Increase activity slowly    Complete by:  As directed             Medication List    TAKE these medications        acetaminophen 325 MG tablet  Commonly known as:  TYLENOL  Take 2 tablets (650 mg total) by mouth every 6 (six) hours as needed for mild pain (or Fever >/= 101).     amantadine 100 MG capsule  Commonly known as:  SYMMETREL  Take 1 capsule  (100 mg total) by mouth 2 (two) times daily.     busPIRone 15 MG tablet  Commonly known as:  BUSPAR  Take 15 mg by mouth 2 (two) times daily.     carbidopa-levodopa 25-100 MG tablet  Commonly known as:  SINEMET IR  Take 1 tablet by mouth 3 (three) times daily.     Carbidopa-Levodopa ER 25-100 MG tablet controlled release  Commonly known as:  SINEMET CR  Take 1 tablet by mouth at bedtime.     feeding supplement (ENSURE ENLIVE) Liqd  Take 237 mLs by mouth 2 (two) times daily between meals.     mirtazapine 15 MG tablet  Commonly known as:  REMERON  Take 15 mg by mouth at bedtime.  Follow-up Information    Follow up with Clayborn Heron, MD. Schedule an appointment as soon as possible for a visit in 1 week.   Specialty:  Family Medicine   Why:  Follow up appt after recent hospitalization   Contact information:   [address] [place] [address] [phone]        The results of significant diagnostics from this hospitalization (including imaging, microbiology, ancillary and laboratory) are listed below for reference.    Significant Diagnostic Studies: Dg Chest 2 View  01/06/2015  CLINICAL DATA:  80 year old female with stroke. Prior history of smoking. EXAM: CHEST  2 VIEW COMPARISON:  Radiograph dated 11/09/2014 FINDINGS: Two views of the chest demonstrate emphysematous changes of the lungs. No focal consolidation, pleural effusion, or pneumothorax. Stable cardiac silhouette. Degenerative changes of the osseous structures and spine. No acute fracture. IMPRESSION: No active cardiopulmonary disease.  No interval change. Electronically Signed   By: Elgie Collard M.D.   On: 01/06/2015 22:21   Ct Head Wo Contrast  01/06/2015  CLINICAL DATA:  Code stroke. Not answering questions or following commands. EXAM: CT HEAD WITHOUT CONTRAST TECHNIQUE: Contiguous axial images were obtained from the base of the skull through the vertex without intravenous contrast.  COMPARISON:  11/09/2014 FINDINGS: There is no evidence of mass effect, midline shift, or extra-axial fluid collections. There is no evidence of a space-occupying lesion or intracranial hemorrhage. There is no evidence of a cortical-based area of acute infarction. There is generalized cerebral atrophy. There is periventricular white matter low attenuation likely secondary to microangiopathy. The ventricles and sulci are appropriate for the patient's age. The basal cisterns are patent. Visualized portions of the orbits are unremarkable. The visualized portions of the paranasal sinuses and mastoid air cells are unremarkable. The osseous structures are unremarkable. IMPRESSION: 1. No acute intracranial pathology. 2. Chronic microvascular disease and cerebral atrophy. These results were called by telephone at the time of interpretation on 01/06/2015 at 5:42 pm to Dr. Rolland Porter , who verbally acknowledged these results. Electronically Signed   By: Elige Ko   On: 01/06/2015 17:43   Mr Brain Wo Contrast  01/06/2015  CLINICAL DATA:  80 year old female is unresponsive. Aphasia. Code stroke. Initial encounter. EXAM: MRI HEAD WITHOUT CONTRAST TECHNIQUE: Multiplanar, multiecho pulse sequences of the brain and surrounding structures were obtained without intravenous contrast. COMPARISON:  Head CT without contrast 1737 hours today, and earlier. FINDINGS: No restricted diffusion or evidence of acute infarction. Major intracranial vascular flow voids are within normal limits. No midline shift, mass effect, evidence of mass lesion, ventriculomegaly, extra-axial collection or acute intracranial hemorrhage. Cervicomedullary junction and pituitary are within normal limits. Negative visualized cervical spine. Supratentorial gray and white matter signal is normal for age. No cortical encephalomalacia or chronic cerebral blood products identified. There is minimal to mild patchy T2 hyperintensity in the pons, nonspecific. Cerebellum  and deep gray matter are within normal limits. Mastoids and paranasal sinuses are clear. Negative orbit and scalp soft tissues. Normal bone marrow signal. IMPRESSION: No acute intracranial abnormality. Minimal to mild for age nonspecific signal changes, most commonly due to chronic small vessel disease. Electronically Signed   By: Odessa Fleming M.D.   On: 01/06/2015 18:43    Microbiology: Recent Results (from the past 240 hour(s))  MRSA PCR Screening     Status: None   Collection Time: 01/08/15  1:36 AM  Result Value Ref Range Status   MRSA by PCR NEGATIVE NEGATIVE Final    Comment:  The GeneXpert MRSA Assay (FDA approved for NASAL specimens only), is one component of a comprehensive MRSA colonization surveillance program. It is not intended to diagnose MRSA infection nor to guide or monitor treatment for MRSA infections.      Labs: Basic Metabolic Panel:  Recent Labs Lab 01/06/15 1753 01/06/15 1804 01/06/15 2146  NA 141 138  --   K 4.2 4.1  --   CL 103 105  --   CO2  --  25  --   GLUCOSE 85 89  --   BUN 16 16  --   CREATININE 0.80 0.85 0.79  CALCIUM  --  9.1  --    Liver Function Tests:  Recent Labs Lab 01/06/15 1804  AST 25  ALT 12*  ALKPHOS 71  BILITOT 0.5  PROT 6.7  ALBUMIN 3.1*   No results for input(s): LIPASE, AMYLASE in the last 168 hours.  Recent Labs Lab 01/06/15 2001  AMMONIA 22   CBC:  Recent Labs Lab 01/06/15 1753 01/06/15 1804 01/06/15 2146  WBC  --  3.6* 4.4  NEUTROABS  --  0.9*  --   HGB 13.6 12.2 11.7*  HCT 40.0 38.9 35.4*  MCV  --  89.0 86.3  PLT  --  325 295   Cardiac Enzymes:  Recent Labs Lab 01/06/15 1742  CKTOTAL 110   BNP: BNP (last 3 results) No results for input(s): BNP in the last 8760 hours.  ProBNP (last 3 results) No results for input(s): PROBNP in the last 8760 hours.  CBG:  Recent Labs Lab 01/06/15 1732  GLUCAP 69

## 2015-01-10 NOTE — Progress Notes (Signed)
Pt is being discharged home. Discharge instructions is given to patient and family 

## 2015-01-10 NOTE — Consult Note (Addendum)
WOC wound consult note Pt familiar to WOC team from previous admission; refer to progress notes in Oct. Reason for Consult: Sacral area pressure injury and right ear pressure injury Pressure Ulcer POA: Yes Measurement: Sacrum: Previous lower sacrum pressure ulcer is healed and has dry calloused scar tissue intact.  Above this is a stage 3 chronic pressure injury; .2X.2X.2cm, pink and dry.  No odor or drainage.    Right ear: Unstageable chronic pressure injury; .8X.2cm, 100% dry eschar, no odor or drainage.  Dressing procedure/placement/frequency: Bactroban to promote moist healing to right ear wound.  Foam dressing to sacrum to protect and promote healing.  Discussed plan of care with patient but she does not appear to understand. No family members present. Please re-consult if further assistance is needed.  Thank-you,  Cammie Mcgeeawn Stori Royse MSN, RN, CWOCN, [place], CNS [phone]

## 2015-01-10 NOTE — Care Management Important Message (Signed)
Important Message  Patient Details  Name: Susan GasserShirley Simmons MRN: [id number] Date of Birth: 1934-07-20   Medicare Important Message Given:  Yes    Jearldean Gutt P Nieves Chapa 01/10/2015, 11:56 AM

## 2015-01-10 NOTE — Care Management Note (Signed)
Case Management Note  Patient Details  Name: Susan GasserShirley Simmons MRN: [id number] Date of Birth: 11/15/1934  Subjective/Objective:                    Action/Plan: Spoke with patient's daughter, Deneise Leveram Adams, to provide update on discharge plan.  Patient was active with [facility] (formerly [facility] for HHPT/OT.  Daughter would like to continue services with that company.  Otelia SanteeFarrah with [facility] was notified and has accepted the referral for discharge home today. Bedside RN updated.  Expected Discharge Date:   (unknown)               Expected Discharge Plan:  Home w Home Health Services  In-House Referral:     Discharge planning Services  CM Consult  Post Acute Care Choice:  Home Health Choice offered to:     DME Arranged:    DME Agency:     HH Arranged:  PT, OT HH Agency:  [facility]  Status of Service:  Completed, signed off  Medicare Important Message Given:    Date Medicare IM Given:    Medicare IM give by:    Date Additional Medicare IM Given:    Additional Medicare Important Message give by:     If discussed at Long Length of Stay Meetings, dates discussed:    Additional Comments:  Anda KraftRobarge, [place] Plake C, RN 01/10/2015, 10:41 AM [phone]

## 2015-01-10 NOTE — Progress Notes (Signed)
Physical Therapy Treatment Patient Details Name: Susan GasserShirley Simmons MRN: [id number] DOB: 11-Jan-1935 Today's Date: 01/10/2015    History of Present Illness Adm 12/1 with AMS; MRI brain negative; EEG pending. PMHx-Parkinson's, anxiety, depression    PT Comments    Patient more alert and communicative. States she walks into her bathroom from her bed "not far," and always with assist. Was able to state she needed to use the bathroom and did so successfully with overall mod assist.   Follow Up Recommendations  Home health PT;Supervision/Assistance - 24 hour (per Case Manager note, daughter to provide care at home)     Equipment Recommendations  None recommended by PT    Recommendations for Other Services       Precautions / Restrictions Precautions Precautions: Fall    Mobility  Bed Mobility Overal bed mobility: Needs Assistance Bed Mobility: Supine to Sit;Sit to Supine     Supine to sit: Max assist Sit to supine: Mod assist   General bed mobility comments: to EOB required use of bed pad to scoot (pt attempted for several minutes and could not); return to bed pt was able to control her torso and lift left leg nearly onto bed  Transfers Overall transfer level: Needs assistance Equipment used: Rolling walker (2 wheeled);None Transfers: Sit to/from [employer]Stand;Stand Pivot Transfers Sit to Stand: Mod assist Stand pivot transfers: Mod assist       General transfer comment: [facility] to armrest to steady herself during stan-pivot; [facility] to RW pt initiated however needed assist to shift anteriorly  Ambulation/Gait Ambulation/Gait assistance: Mod assist Ambulation Distance (Feet): 3 Feet Assistive device: Rolling walker (2 wheeled) Gait Pattern/deviations: Step-to pattern;Decreased stride length;Trunk flexed     General Gait Details: toe walks; assist to maneuver RW and maintain forward weight shift (tends to lean posterior)   Stairs             Wheelchair Mobility    Modified Rankin (Stroke Patients Only) Modified Rankin (Stroke Patients Only) Pre-Morbid Rankin Score: Moderately severe disability Modified Rankin: Moderately severe disability     Balance     Sitting balance-Leahy Scale: Poor Sitting balance - Comments: leans posterior and Rt   Standing balance support: Bilateral upper extremity supported Standing balance-Leahy Scale: Zero Standing balance comment: max assist to maintain standing for pericare (pt tends to retropulse)                    Cognition Arousal/Alertness: Awake/alert Behavior During Therapy: Flat affect Overall Cognitive Status: No family/caregiver present to determine baseline cognitive functioning                      Exercises      General Comments        Pertinent Vitals/Pain Pain Assessment: Faces Faces Pain Scale: No hurt    Home Living                      Prior Function            PT Goals (current goals can now be found in the care plan section) Acute Rehab PT Goals Patient Stated Goal: unable Time For Goal Achievement: 01/14/15 Progress towards PT goals: Progressing toward goals    Frequency  Min 3X/week    PT Plan Current plan remains appropriate    Co-evaluation             End of Session Equipment Utilized During  Treatment: Gait belt Activity Tolerance: Patient limited by fatigue Patient left: in bed;with call bell/phone within reach;with bed alarm set (in chair position for lunch)     Time: 9147-8295 PT Time Calculation (min) (ACUTE ONLY): 23 min  Charges:  $Therapeutic Activity: 23-37 mins                    G Codes:      Susan Simmons 01-17-15, 12:30 PM Pager [phone]

## 2015-01-18 NOTE — Progress Notes (Signed)
Old episode closed

## 2015-01-24 ENCOUNTER — Other Ambulatory Visit: Payer: Self-pay | Admitting: Neurology

## 2015-01-25 ENCOUNTER — Encounter (INDEPENDENT_AMBULATORY_CARE_PROVIDER_SITE_OTHER): Payer: Self-pay

## 2015-01-25 ENCOUNTER — Encounter: Payer: Self-pay | Admitting: Neurology

## 2015-01-25 ENCOUNTER — Ambulatory Visit (INDEPENDENT_AMBULATORY_CARE_PROVIDER_SITE_OTHER): Payer: Medicare Other | Admitting: Neurology

## 2015-01-25 VITALS — BP 123/64 | HR 73 | Ht 64.0 in | Wt 120.2 lb

## 2015-01-25 DIAGNOSIS — G20 Parkinson's disease: Secondary | ICD-10-CM | POA: Diagnosis not present

## 2015-01-25 MED ORDER — CARBIDOPA-LEVODOPA 25-100 MG PO TABS: 1.0000 | ORAL_TABLET | Freq: Three times a day (TID) | ORAL | Status: DC

## 2015-01-25 NOTE — Patient Instructions (Addendum)
Overall you are doing fairly well but I do want to suggest a few things today:   Remember to drink plenty of fluid, eat healthy meals and do not skip any meals. Try to eat protein with a every meal and eat a healthy snack such as fruit or nuts in between meals. Try to keep a regular sleep-wake schedule and try to exercise daily, particularly in the form of walking, 20-30 minutes a day, if you can.   As far as your medications are concerned, I would like to suggest: Increase Sinemet(carbidopa/levodopa) to 1 tablet 3x a day 8am, noon, and 4pm.   I would like to see you back in 3 months, sooner if we need to. Please call us with any interim questions, concerns, problems, updates or refill requests.   Please also call us for any test results so we can go over those with you on the phone.  My clinical assistant and will answer any of your questions and relay your messages to me and also relay most of my messages to you.   Our phone number is [phone]. We also have an after hours call service for urgent matters and there is a physician on-call for urgent questions. For any emergencies you know to call 911 or go to the nearest emergency room

## 2015-01-25 NOTE — Progress Notes (Signed)
[facility]    Provider:  Dr Lucia Gaskins Referring Provider: Clayborn Heron, MD Primary Care Physician:  Clayborn Heron, MD  CC: Parkinson's disease  HPI:  Susan Simmons is a 80 y.o. female here as a referral from Dr. Barbaraann Barthel for Parkinson's disease. Here with lovely daughter who provides most information.   She is taking Sinemet IR twice daily. 8:30am and 5pm. She did not want to take it 3 times a day even though she was prescribed inpatient. She does not want to take the CR overnight. She does not want to take the amantadine  bid she was prescribed. No dyskinesias. Will increase Sinemet to 3x a day. They decline the amantadine and CR at night. She gets increased tremor about 1pm which is likely when the Sinemet may be wearing off. Freezing less now. Walking a little better but still significant shuffling and balance problems. No dizziness. No drooling. Her voice is hypophonic. She is getting physical therapy at home. No swallowing problems, no choking.  Appetite is excellent. She has gained weight. No constipation.    Interval history 11/01/2014: Things are getting worse. Worse with walking. More shuffling. No tremor. Voice not hypophonic, She was in rehab for 6 weeks after UTI with encephalopathy. Not acting out dreams or having vivid dreams. She has some drooling. She has difficulty getting out of a chair, won't get up on her own, won't stand on her own. Maybe some hallucinations, she will see family members that are not there. She has urinary incontinence. She has depression and mood changes. Some days her memory is excellent, other days she seems demented. Patient reports she feels she internally shaking.   HPI: Susan Simmons is a 80 y.o. female here as a referral from Dr. Barbaraann Barthel for   For 5 years, she has had bad depression since her husband passed. She laid in bed for 5 years, stopped doing everything, then had muscle disuse. Things are better now. She  has moved in with her daughter. Daughter provides all information. She has been to physical therapy, been to inpatient PT, daughter works every day to keep up her strength, she goes to adult daycare. Patient still has decreased interest, decreased social interest. She can walk normal with encouragement. No falls, instability, Mother says she has tremors, "like I am scared". Daughter says she she has anxiety. Patient says she notice it when using the hand, like when dressing. Decreased concentration.She heasitates, says she is "scared to get up". Symptoms have improved since she first moved in with daughter, and some days she can even "walk normally". She has been on 14 different medications. Unclear use of dopamine blocking agents. Severe worsening depression, refractory.  Review of Systems: Patient complains of symptoms per HPI as well as the following symptoms: No CP, No SOB. Pertinent negatives per HPI. All others negative.  Review of Systems: Patient complains of symptoms per HPI as well as the following symptoms:  No confusion, no chest pain, no shortness of breath. Pertinent negatives per HPI. All others negative.   Social History   Social History  . Marital Status: Divorced    Spouse Name: N/A  . Number of Children: 4  . Years of Education: 11   Occupational History  . Not on file.   Social History Main Topics  . Smoking status: Former Smoker    Quit date: 03/04/1996  . Smokeless tobacco: Not on file  . Alcohol Use: No  . Drug Use: No  . Sexual Activity:  Not on file   Other Topics Concern  . Not on file   Social History Narrative   Lives at home with daughter   Caffeine use: none    Family History  Problem Relation Age of Onset  . Dementia Neg Hx   . Parkinsonism Neg Hx     Past Medical History  Diagnosis Date  . Anxiety   . Depression   . Parkinson disease [facility])     Past Surgical History  Procedure Laterality Date  . Denies surgical history      Current  Outpatient Prescriptions  Medication Sig Dispense Refill  . busPIRone (BUSPAR) 15 MG tablet Take 15 mg by mouth 2 (two) times daily.    . carbidopa-levodopa (SINEMET IR) 25-100 MG tablet Take 1 tablet by mouth 3 (three) times daily. 90 tablet 0  . Carbidopa-Levodopa ER (SINEMET CR) 25-100 MG tablet controlled release Take 1 tablet by mouth at bedtime. 30 tablet 0  . feeding supplement, ENSURE ENLIVE, (ENSURE ENLIVE) LIQD Take 237 mLs by mouth 2 (two) times daily between meals. 237 mL 12  . mirtazapine (REMERON) 15 MG tablet Take 15 mg by mouth at bedtime.     No current facility-administered medications for this visit.    Allergies as of 01/25/2015  . (No Known Allergies)    Vitals: BP 123/64 mmHg  Pulse 73  Ht  (1.626 m)  Wt 120 lb 3.2 oz (54.522 kg)  BMI 20.62 kg/m2 Last Weight:  Wt Readings from Last 1 Encounters:  01/25/15 120 lb 3.2 oz (54.522 kg)   Last Height:   Ht Readings from Last 1 Encounters:  01/25/15  (1.626 m)     Speech:  Not aphasic, not dysarthric Cognition:   The patient is oriented to person  Cranial Nerves:Decreased blink reflex  The pupils are equal, round, and reactive to light. Visual fields are full to finger confrontation. Extraocular movements are intact. Trigeminal sensation is intact and the muscles of mastication are normal. The face is symmetric. The palate elevates in the midline. Hearing intact. Voice is normal. Shoulder shrug is normal. The tongue has normal motion without fasciculations.   Coordination:  bradykinetic  Gait:  significant Shuffling, improved freezing, decreased arm swing with re-emergent right hand tremor  Motor Observation:  No resting tremor Tone:  increased in the upper extremities with cogwheeling  Posture:  Posture is severely stooped   Strength:  Poor effort, anti-gravity, symmetric     Assessment/Plan: 80 year old female with  parkinsonism She has shuffling gait,  decreased arm swing, decreased blink reflex,  Upper extremity cogwheeling. She has responded to Sinemet IR. She is on it twice a day at 8 AM and 5 PM and notices reemergence of symptoms at 1 PM likely when the mediction is wearing off. She still has shuffling gait but it is improved, freezing has improved, still with significant balance problems.  She has physical therapy at home currently. She lives at home with her lovelydaughter and has very good care. Will increase Sinemet 3 times a day. If any changes in mentation then go back to 2 times a day and call the office immediately. Fall precautions. Return in 3 months and call the office for anything needed. Montral cognitive assessment score at next appointment and discuss memory.  Cc: dr. Ardelia Mems, MD  [facility] [address] [place], [place] [address]  Phone [phone] Fax [phone]  A total of 45 minutes was spent face-to-face  with this patient. Over half this time was spent on counseling patient on the parkinsonism diagnosis and different diagnostic and therapeutic options available.

## 2015-02-01 ENCOUNTER — Telehealth: Payer: Self-pay | Admitting: Neurology

## 2015-02-01 NOTE — Telephone Encounter (Signed)
[name] called to let physician know pt is taking medication carbidopa-levodopa (SINEMET IR) 25-100 MG tablet 2 x daily now. Pt was becoming more confused. May call [phone]

## 2015-02-01 NOTE — Telephone Encounter (Signed)
Is the patient's tremor is improving or not?

## 2015-02-01 NOTE — Telephone Encounter (Signed)
Spoke to pt's daughter, [name] (per DPR). [name] states that Dr. Lucia GaskinsAhern instructed pt to take the sinemet TID. However, while pt was taking it TID, she was more confused. Her tremor is improving. However, pt's daughter states that she only wants to give it to her BID so she is not as confused. She wants this noted in the chart that she tried the TID but it was making her too confused. I made pt's daughter aware that Dr. Lucia GaskinsAhern is out of the office and I will send this message to Dr. Howard PouchAHer to make her aware of this change when Dr. Lucia GaskinsAhern is back in the office. [name] verbalized understanding.

## 2015-02-03 NOTE — Telephone Encounter (Signed)
Spoke to the daughter, that is fine to go back to twice daily.

## 2015-02-04 ENCOUNTER — Emergency Department (HOSPITAL_COMMUNITY): Payer: Medicare Other

## 2015-02-04 ENCOUNTER — Observation Stay (HOSPITAL_COMMUNITY)
Admission: EM | Admit: 2015-02-04 | Discharge: 2015-02-07 | Disposition: A | Discharge status: Skilled Nursing Facility | Payer: Medicare Other | Attending: Internal Medicine | Admitting: Internal Medicine

## 2015-02-04 ENCOUNTER — Encounter (HOSPITAL_COMMUNITY): Payer: Self-pay | Admitting: Emergency Medicine

## 2015-02-04 DIAGNOSIS — G20 Parkinson's disease: Secondary | ICD-10-CM | POA: Diagnosis not present

## 2015-02-04 DIAGNOSIS — G92 Toxic encephalopathy: Secondary | ICD-10-CM | POA: Diagnosis not present

## 2015-02-04 DIAGNOSIS — R410 Disorientation, unspecified: Secondary | ICD-10-CM

## 2015-02-04 DIAGNOSIS — Y998 Other external cause status: Secondary | ICD-10-CM | POA: Insufficient documentation

## 2015-02-04 DIAGNOSIS — E440 Moderate protein-calorie malnutrition: Secondary | ICD-10-CM | POA: Diagnosis not present

## 2015-02-04 DIAGNOSIS — Y9289 Other specified places as the place of occurrence of the external cause: Secondary | ICD-10-CM | POA: Insufficient documentation

## 2015-02-04 DIAGNOSIS — F319 Bipolar disorder, unspecified: Secondary | ICD-10-CM | POA: Insufficient documentation

## 2015-02-04 DIAGNOSIS — N390 Urinary tract infection, site not specified: Secondary | ICD-10-CM | POA: Diagnosis present

## 2015-02-04 DIAGNOSIS — T428X5A Adverse effect of antiparkinsonism drugs and other central muscle-tone depressants, initial encounter: Secondary | ICD-10-CM | POA: Insufficient documentation

## 2015-02-04 DIAGNOSIS — Y9389 Activity, other specified: Secondary | ICD-10-CM | POA: Diagnosis not present

## 2015-02-04 DIAGNOSIS — N3000 Acute cystitis without hematuria: Secondary | ICD-10-CM | POA: Diagnosis not present

## 2015-02-04 DIAGNOSIS — R011 Cardiac murmur, unspecified: Secondary | ICD-10-CM | POA: Insufficient documentation

## 2015-02-04 DIAGNOSIS — F419 Anxiety disorder, unspecified: Secondary | ICD-10-CM | POA: Diagnosis not present

## 2015-02-04 DIAGNOSIS — M25551 Pain in right hip: Secondary | ICD-10-CM | POA: Diagnosis not present

## 2015-02-04 DIAGNOSIS — G319 Degenerative disease of nervous system, unspecified: Secondary | ICD-10-CM | POA: Insufficient documentation

## 2015-02-04 DIAGNOSIS — M79671 Pain in right foot: Secondary | ICD-10-CM | POA: Diagnosis not present

## 2015-02-04 DIAGNOSIS — R531 Weakness: Secondary | ICD-10-CM

## 2015-02-04 DIAGNOSIS — G9340 Encephalopathy, unspecified: Secondary | ICD-10-CM | POA: Diagnosis present

## 2015-02-04 DIAGNOSIS — W19XXXA Unspecified fall, initial encounter: Secondary | ICD-10-CM | POA: Insufficient documentation

## 2015-02-04 DIAGNOSIS — F0280 Dementia in other diseases classified elsewhere without behavioral disturbance: Secondary | ICD-10-CM | POA: Insufficient documentation

## 2015-02-04 DIAGNOSIS — E042 Nontoxic multinodular goiter: Secondary | ICD-10-CM | POA: Insufficient documentation

## 2015-02-04 HISTORY — DX: Cardiac murmur, unspecified: R01.1

## 2015-02-04 LAB — BASIC METABOLIC PANEL
Anion gap: 6 (ref 5–15)
BUN: 18 mg/dL (ref 6–20)
CO2: 30 mmol/L (ref 22–32)
Calcium: 9.2 mg/dL (ref 8.9–10.3)
Chloride: 105 mmol/L (ref 101–111)
Creatinine, Ser: 1.05 mg/dL — ABNORMAL HIGH (ref 0.44–1.00)
GFR calc Af Amer: 57 mL/min — ABNORMAL LOW (ref >60)
GFR calc non Af Amer: 50 mL/min — ABNORMAL LOW (ref >60)
Glucose, Bld: 98 mg/dL (ref 65–99)
Potassium: 5.6 mmol/L — ABNORMAL HIGH (ref 3.5–5.1)
Sodium: 141 mmol/L (ref 135–145)

## 2015-02-04 LAB — URINE MICROSCOPIC-ADD ON: Squamous Epithelial / LPF: NONE SEEN

## 2015-02-04 LAB — CBC WITH DIFFERENTIAL/PLATELET
Basophils Absolute: 0 10*3/uL (ref 0.0–0.1)
Basophils Relative: 1 %
Eosinophils Absolute: 0 10*3/uL (ref 0.0–0.7)
Eosinophils Relative: 1 %
HCT: 41.6 % (ref 36.0–46.0)
Hemoglobin: 13 g/dL (ref 12.0–15.0)
Lymphocytes Relative: 39 %
Lymphs Abs: 2.5 10*3/uL (ref 0.7–4.0)
MCH: 27.8 pg (ref 26.0–34.0)
MCHC: 31.3 g/dL (ref 30.0–36.0)
MCV: 89.1 fL (ref 78.0–100.0)
Monocytes Absolute: 1.2 10*3/uL — ABNORMAL HIGH (ref 0.1–1.0)
Monocytes Relative: 18 %
Neutro Abs: 2.7 10*3/uL (ref 1.7–7.7)
Neutrophils Relative %: 41 %
Platelets: 283 10*3/uL (ref 150–400)
RBC: 4.67 MIL/uL (ref 3.87–5.11)
RDW: 13.5 % (ref 11.5–15.5)
WBC: 6.5 10*3/uL (ref 4.0–10.5)

## 2015-02-04 LAB — URINALYSIS, ROUTINE W REFLEX MICROSCOPIC
Bilirubin Urine: NEGATIVE
Glucose, UA: NEGATIVE mg/dL
Ketones, ur: NEGATIVE mg/dL
Nitrite: NEGATIVE
Protein, ur: NEGATIVE mg/dL
Specific Gravity, Urine: 1.01 (ref 1.005–1.030)
pH: 6.5 (ref 5.0–8.0)

## 2015-02-04 LAB — POTASSIUM: Potassium: 4.4 mmol/L (ref 3.5–5.1)

## 2015-02-04 MED ORDER — SODIUM CHLORIDE 0.9 % IV BOLUS (SEPSIS)
1000.0000 mL | Freq: Once | INTRAVENOUS | Status: AC
  Administered 2015-02-04: 1000 mL via INTRAVENOUS

## 2015-02-04 MED ORDER — DEXTROSE 5 % IV SOLN
1.0000 g | Freq: Once | INTRAVENOUS | Status: AC
  Administered 2015-02-04: 1 g via INTRAVENOUS
  Filled 2015-02-04: qty 10

## 2015-02-04 NOTE — ED Notes (Addendum)
Daughter phone number [phone], please contact her with information.

## 2015-02-04 NOTE — ED Notes (Signed)
Per lab tech patient refused blood draw.

## 2015-02-04 NOTE — ED Notes (Signed)
Per radiology tech.  Patient refused CT.  Stated does not want CT or X-rays.

## 2015-02-04 NOTE — ED Notes (Signed)
Contacted family to come in and speak with MD.  Daughter stated it will take her a little while to get here but, will be coming back.

## 2015-02-04 NOTE — ED Provider Notes (Signed)
4:56 PM took an end of shift patient handoff report from BoeingChris Lawyer, PA-C. States that this patient is to be evaluated by the hospitalist for possible admission. If the hospitalist decided not to admit the patient, she is to be discharged home. 7:13 PM patient is set to be admitted under Dr. Mahala MenghiniSamtani as of 1700 this evening. Patient will need to be [facility] per recommendation by Dr. Mahala MenghiniSamtani and Dr. Leroy Kennedyamilo. Merlene Morsehris Lawyer's HPI: "Patient presents to the emergency department from a fall that occurred prior to arrival. The she is a poor historian but does say that she thinks she tripped and fell. The daughter states that she seems more weak over the last few days and more confused and she felt she may have urinary tract infection, so she did ask spoke with the PCP who is a similar nurse to evaluate the patient. Patient has had no vomiting, diarrhea, chest pain, shortness of breath, abdominal pain, back pain, neck pain, fever, incontinence, bloody stool, hematemesis, or syncope. The patient did complain of burning with urination yesterday."   Results for orders placed or performed during the hospital encounter of 02/04/15  Basic metabolic panel  Result Value Ref Range   Sodium 141 135 - 145 mmol/L   Potassium 5.6 (H) 3.5 - 5.1 mmol/L   Chloride 105 101 - 111 mmol/L   CO2 30 22 - 32 mmol/L   Glucose, Bld 98 65 - 99 mg/dL   BUN 18 6 - 20 mg/dL   Creatinine, Ser 1.611.05 (H) 0.44 - 1.00 mg/dL   Calcium 9.2 8.9 - 09.610.3 mg/dL   GFR calc non Af Amer 50 (L) >60 mL/min   GFR calc Af Amer 57 (L) >60 mL/min   Anion gap 6 5 - 15  Urinalysis, Routine w reflex microscopic  Result Value Ref Range   Color, Urine YELLOW YELLOW   APPearance HAZY (A) CLEAR   Specific Gravity, Urine 1.010 1.005 - 1.030   pH 6.5 5.0 - 8.0   Glucose, UA NEGATIVE NEGATIVE mg/dL   Hgb urine dipstick LARGE (A) NEGATIVE   Bilirubin Urine NEGATIVE NEGATIVE   Ketones, ur NEGATIVE NEGATIVE mg/dL   Protein, ur NEGATIVE  NEGATIVE mg/dL   Nitrite NEGATIVE NEGATIVE   Leukocytes, UA MODERATE (A) NEGATIVE  CBC with Differential/Platelet  Result Value Ref Range   WBC 6.5 4.0 - 10.5 K/uL   RBC 4.67 3.87 - 5.11 MIL/uL   Hemoglobin 13.0 12.0 - 15.0 g/dL   HCT 04.541.6 40.936.0 - 81.146.0 %   MCV 89.1 78.0 - 100.0 fL   MCH 27.8 26.0 - 34.0 pg   MCHC 31.3 30.0 - 36.0 g/dL   RDW 91.413.5 78.211.5 - 95.615.5 %   Platelets 283 150 - 400 K/uL   Neutrophils Relative % 41 %   Neutro Abs 2.7 1.7 - 7.7 K/uL   Lymphocytes Relative 39 %   Lymphs Abs 2.5 0.7 - 4.0 K/uL   Monocytes Relative 18 %   Monocytes Absolute 1.2 (H) 0.1 - 1.0 K/uL   Eosinophils Relative 1 %   Eosinophils Absolute 0.0 0.0 - 0.7 K/uL   Basophils Relative 1 %   Basophils Absolute 0.0 0.0 - 0.1 K/uL  Urine microscopic-add on  Result Value Ref Range   Squamous Epithelial / LPF NONE SEEN NONE SEEN   WBC, UA 6-30 0 - 5 WBC/hpf   RBC / HPF 0-5 0 - 5 RBC/hpf   Bacteria, UA FEW (A) NONE SEEN   Urine-Other LESS THAN  10 mL OF URINE SUBMITTED   Potassium  Result Value Ref Range   Potassium 4.4 3.5 - 5.1 mmol/L   Ct Head Wo Contrast  02/04/2015  CLINICAL DATA:  Fall, denies loss of consciousness. Hit head and right side of face. EXAM: CT HEAD WITHOUT CONTRAST CT CERVICAL SPINE WITHOUT CONTRAST TECHNIQUE: Multidetector CT imaging of the head and cervical spine was performed following the standard protocol without intravenous contrast. Multiplanar CT image reconstructions of the cervical spine were also generated. COMPARISON:  Brain MRI dated 01/06/2015 and head CT dated 01/06/2015. FINDINGS: CT HEAD FINDINGS Again noted is mild generalized brain atrophy with commensurate dilatation of the ventricles and sulci. Mild chronic small vessel ischemic change again noted within the deep periventricular white matter regions bilaterally. There is no mass, hemorrhage, edema or other evidence of acute parenchymal abnormality. No extra-axial hemorrhage. No osseous fracture or dislocation seen.  Superficial soft tissues are unremarkable. CT CERVICAL SPINE FINDINGS Degenerative changes are seen throughout the cervical spine, at least moderate in degree with associated disc space narrowings and osseous spurring. Disc-osteophytic bulges at each level but no more than mild central canal stenosis at any level. Mild levoscoliosis and mildly accentuated lordosis likely related to the underlying degenerative changes. No fracture line or displaced fracture fragment identified. Facets are well aligned throughout. There is heterogeneous enlargement of the bilateral thyroid lobes, right greater than left. Paravertebral soft tissues are otherwise unremarkable. Scarring/fibrosis noted at each lung apex. IMPRESSION: 1. No evidence of acute intracranial abnormality. No intracranial hemorrhage or edema. No skull fracture. Mild atrophy and chronic ischemic changes, as detailed above. 2. Degenerative changes of the cervical spine, as detailed above. No fracture or acute subluxation identified within the cervical spine. 3. Heterogeneous enlargement of the thyroid gland, presumed multinodular goiter. Would consider nonemergent follow-up with thyroid ultrasound and/or nuclear medicine thyroid scan at some point to ensure benignity. Electronically Signed   By: Bary Richard M.D.   On: 02/04/2015 14:11   Ct Cervical Spine Wo Contrast  02/04/2015  CLINICAL DATA:  Fall, denies loss of consciousness. Hit head and right side of face. EXAM: CT HEAD WITHOUT CONTRAST CT CERVICAL SPINE WITHOUT CONTRAST TECHNIQUE: Multidetector CT imaging of the head and cervical spine was performed following the standard protocol without intravenous contrast. Multiplanar CT image reconstructions of the cervical spine were also generated. COMPARISON:  Brain MRI dated 01/06/2015 and head CT dated 01/06/2015. FINDINGS: CT HEAD FINDINGS Again noted is mild generalized brain atrophy with commensurate dilatation of the ventricles and sulci. Mild chronic  small vessel ischemic change again noted within the deep periventricular white matter regions bilaterally. There is no mass, hemorrhage, edema or other evidence of acute parenchymal abnormality. No extra-axial hemorrhage. No osseous fracture or dislocation seen. Superficial soft tissues are unremarkable. CT CERVICAL SPINE FINDINGS Degenerative changes are seen throughout the cervical spine, at least moderate in degree with associated disc space narrowings and osseous spurring. Disc-osteophytic bulges at each level but no more than mild central canal stenosis at any level. Mild levoscoliosis and mildly accentuated lordosis likely related to the underlying degenerative changes. No fracture line or displaced fracture fragment identified. Facets are well aligned throughout. There is heterogeneous enlargement of the bilateral thyroid lobes, right greater than left. Paravertebral soft tissues are otherwise unremarkable. Scarring/fibrosis noted at each lung apex. IMPRESSION: 1. No evidence of acute intracranial abnormality. No intracranial hemorrhage or edema. No skull fracture. Mild atrophy and chronic ischemic changes, as detailed above. 2. Degenerative changes of the cervical  spine, as detailed above. No fracture or acute subluxation identified within the cervical spine. 3. Heterogeneous enlargement of the thyroid gland, presumed multinodular goiter. Would consider nonemergent follow-up with thyroid ultrasound and/or nuclear medicine thyroid scan at some point to ensure benignity. Electronically Signed   By: Bary Richard M.D.   On: 02/04/2015 14:11   Dg Hip Unilat With Pelvis 2-3 Views Right  02/04/2015  CLINICAL DATA:  Fall, generalized right hip pain. History of Parkinson's disease. EXAM: DG HIP (WITH OR WITHOUT PELVIS) 2-3V RIGHT COMPARISON:  Plain film of the pelvis and right hip dated 07/14/2014. FINDINGS: Single view of the pelvis and two views of the right hip are provided. Osseous alignment is normal. No  fracture line or displaced fracture fragment identified. No significant degenerative change at either hip joint. Probable mild degenerative change within the lower lumbar spine. Soft tissues about the pelvis and right hip are unremarkable. Faint calcific density underlying the right inferior pubic ramus suggests a developing chronic calcific tendinopathy. IMPRESSION: No acute findings.  No osseous fracture or dislocation. Electronically Signed   By: Bary Richard M.D.   On: 02/04/2015 14:17     Anselm Pancoast, PA-C 02/04/15 [id number]  Anselm Pancoast, PA-C 02/04/15 [id number]  Richardean Canal, MD 02/04/15 [phone]

## 2015-02-04 NOTE — ED Notes (Addendum)
Patient presents for fall. Visitor reports patient fell from toilet, denies LOC, hit head and right side of face on rug, no anticoagulants. No tenderness will palpating spine, hips, clavicles or shoulders. C-collar in place. Patient alert and oriented x3. Disoriented to time.

## 2015-02-04 NOTE — ED Notes (Signed)
Patient transported to CT 

## 2015-02-04 NOTE — ED Notes (Signed)
Pt refuses to let tech draw blood

## 2015-02-04 NOTE — ED Notes (Signed)
Daughter states that patient has a history of UTI.  Also states that has had falls previously with UTI.  Tried to obtain urine from patient and was unsuccessful.  Will attempt again.

## 2015-02-04 NOTE — H&P (Signed)
[facility] History and Physical  Susan Simmons ZOX:[id number] DOB: 11-04-1934 DOA: 02/04/2015  80 y/o ? Known Parkinson's disease managed by Dr. Lucia Gaskins neurology Bipolar disease Pressure ulcer stage II Moderate malnutrition she was admitted 01/06/15 with change in mentation and a workup was done for CVA and other things It was felt that this was primarily secondary to Parkinson's and was recommended at that time to continue Sinemet 1 tab 3 times a day 7 AM 12 PM 5 PM and one Sinemet CR 25/100 daily at bedtime and also amantadine was added twice a day Based on report from neurologist 12/20 patient was noncompliant with these recommendations and only taking her Sinemet IR twice daily 8:30 and 5 PM She was discharged back home   it is noted that she was admitted with similar findings 11/11/14 She also has been admitted 08/04/14 for encephalopathy  In the emergency room found to have normal white count, normal hemoglobin, normal platelet count Potassium 5.6 BUN/creatinine 18/1.0 over baseline 16/0.8 Large hemoglobin based on cath specimen moderate leukocytes no nitrites CT cervical spine CT head showed no evidence of fracture degenerative changes in the spine and heterogeneous enlargement of thyroid gland presumed multinodular goiter Hip x-ray showed no specific findings of fracture   Patient is noncommittal with responses and does appear to be less interested in conversation but contaminatedars 2016 however    specifically denies any chest pain, nausea, vomiting, did dark stool, tarry stool, blurred vision, double vision  The rest of review of systems is unobtainable   cannot obtain family social history     Past Medical History  Diagnosis Date  . Anxiety   . Depression   . Parkinson disease [facility])    Past Surgical History  Procedure Laterality Date  . Denies surgical history     Social History:  Social History   Social History Narrative   Lives at home with daughter    Caffeine use: none    No Known Allergies  Family History  Problem Relation Age of Onset  . Dementia Neg Hx   . Parkinsonism Neg Hx     Prior to Admission medications   Medication Sig Start Date End Date Taking? Authorizing Provider  busPIRone (BUSPAR) 15 MG tablet Take 15 mg by mouth 2 (two) times daily.   Yes Historical Provider, MD  carbidopa-levodopa (SINEMET IR) 25-100 MG tablet Take 1 tablet by mouth 3 (three) times daily. Patient taking differently: Take 1 tablet by mouth 2 (two) times daily.  01/25/15  Yes Anson Fret, MD  DM-Doxylamine-Acetaminophen 15-6.25-325 MG/15ML LIQD Take 30 mLs by mouth at bedtime as needed (cough/cold symptoms.).   Yes Historical Provider, MD  mirtazapine (REMERON) 15 MG tablet Take 15 mg by mouth at bedtime.   Yes Historical Provider, MD  Multiple Vitamins-Minerals (EMERGEN-C IMMUNE PLUS PO) Take by mouth as directed.   Yes Historical Provider, MD  feeding supplement, ENSURE ENLIVE, (ENSURE ENLIVE) LIQD Take 237 mLs by mouth 2 (two) times daily between meals. 11/11/14   Vassie Loll, MD   Physical Exam: Filed Vitals:   02/04/15 1127 02/04/15 1341 02/04/15 1551 02/04/15 1614  BP: 105/48 124/48 136/62   Pulse: 80 71 78   Temp:   97.5 F (36.4 C) 98.3 F (36.8 C)  TempSrc:   Oral Rectal  Resp: SpO2: 100% 99% 98%      on exam pleasant frail female of mixed ethnicity?  EOMI NCAT and able to tell me how the fingers  by direct confrontation Chest clinically clear no added sound  S1-S2 no murmur rub or gallop skin over back none visualized Lower extremities at hips have/5 power Reflexes are 2/3 Sensory grossly intact the patient not very participatory Patient not participatory with finger-nose-finger testing either  Labs on Admission:  Basic Metabolic Panel:  Recent Labs Lab 02/04/15 1322 02/04/15 1604  NA 141  --   K 5.6* 4.4  CL 105  --   CO2 30  --   GLUCOSE 98  --   BUN 18  --   CREATININE 1.05*  --   CALCIUM  9.2  --    Liver Function Tests: No results for input(s): AST, ALT, ALKPHOS, BILITOT, PROT, ALBUMIN in the last 168 hours. No results for input(s): LIPASE, AMYLASE in the last 168 hours. No results for input(s): AMMONIA in the last 168 hours. CBC:  Recent Labs Lab 02/04/15 1417  WBC 6.5  NEUTROABS 2.7  HGB 13.0  HCT 41.6  MCV 89.1  PLT 283   Cardiac Enzymes: No results for input(s): CKTOTAL, CKMB, CKMBINDEX, TROPONINI in the last 168 hours.  BNP (last 3 results) No results for input(s): BNP in the last 8760 hours.  ProBNP (last 3 results) No results for input(s): PROBNP in the last 8760 hours.  CBG: No results for input(s): GLUCAP in the last 168 hours.  Radiological Exams on Admission: Ct Head Wo Contrast  02/04/2015  CLINICAL DATA:  Fall, denies loss of consciousness. Hit head and right side of face. EXAM: CT HEAD WITHOUT CONTRAST CT CERVICAL SPINE WITHOUT CONTRAST TECHNIQUE: Multidetector CT imaging of the head and cervical spine was performed following the standard protocol without intravenous contrast. Multiplanar CT image reconstructions of the cervical spine were also generated. COMPARISON:  Brain MRI dated 01/06/2015 and head CT dated 01/06/2015. FINDINGS: CT HEAD FINDINGS Again noted is mild generalized brain atrophy with commensurate dilatation of the ventricles and sulci. Mild chronic small vessel ischemic change again noted within the deep periventricular white matter regions bilaterally. There is no mass, hemorrhage, edema or other evidence of acute parenchymal abnormality. No extra-axial hemorrhage. No osseous fracture or dislocation seen. Superficial soft tissues are unremarkable. CT CERVICAL SPINE FINDINGS Degenerative changes are seen throughout the cervical spine, at least moderate in degree with associated disc space narrowings and osseous spurring. Disc-osteophytic bulges at each level but no more than mild central canal stenosis at any level. Mild levoscoliosis  and mildly accentuated lordosis likely related to the underlying degenerative changes. No fracture line or displaced fracture fragment identified. Facets are well aligned throughout. There is heterogeneous enlargement of the bilateral thyroid lobes, right greater than left. Paravertebral soft tissues are otherwise unremarkable. Scarring/fibrosis noted at each lung apex. IMPRESSION: 1. No evidence of acute intracranial abnormality. No intracranial hemorrhage or edema. No skull fracture. Mild atrophy and chronic ischemic changes, as detailed above. 2. Degenerative changes of the cervical spine, as detailed above. No fracture or acute subluxation identified within the cervical spine. 3. Heterogeneous enlargement of the thyroid gland, presumed multinodular goiter. Would consider nonemergent follow-up with thyroid ultrasound and/or nuclear medicine thyroid scan at some point to ensure benignity. Electronically Signed   By: Bary RichardStan  Maynard M.D.   On: 02/04/2015 14:11   Ct Cervical Spine Wo Contrast  02/04/2015  CLINICAL DATA:  Fall, denies loss of consciousness. Hit head and right side of face. EXAM: CT HEAD WITHOUT CONTRAST CT CERVICAL SPINE WITHOUT CONTRAST TECHNIQUE: Multidetector CT imaging of the head and cervical spine was performed  following the standard protocol without intravenous contrast. Multiplanar CT image reconstructions of the cervical spine were also generated. COMPARISON:  Brain MRI dated 01/06/2015 and head CT dated 01/06/2015. FINDINGS: CT HEAD FINDINGS Again noted is mild generalized brain atrophy with commensurate dilatation of the ventricles and sulci. Mild chronic small vessel ischemic change again noted within the deep periventricular white matter regions bilaterally. There is no mass, hemorrhage, edema or other evidence of acute parenchymal abnormality. No extra-axial hemorrhage. No osseous fracture or dislocation seen. Superficial soft tissues are unremarkable. CT CERVICAL SPINE FINDINGS  Degenerative changes are seen throughout the cervical spine, at least moderate in degree with associated disc space narrowings and osseous spurring. Disc-osteophytic bulges at each level but no more than mild central canal stenosis at any level. Mild levoscoliosis and mildly accentuated lordosis likely related to the underlying degenerative changes. No fracture line or displaced fracture fragment identified. Facets are well aligned throughout. There is heterogeneous enlargement of the bilateral thyroid lobes, right greater than left. Paravertebral soft tissues are otherwise unremarkable. Scarring/fibrosis noted at each lung apex. IMPRESSION: 1. No evidence of acute intracranial abnormality. No intracranial hemorrhage or edema. No skull fracture. Mild atrophy and chronic ischemic changes, as detailed above. 2. Degenerative changes of the cervical spine, as detailed above. No fracture or acute subluxation identified within the cervical spine. 3. Heterogeneous enlargement of the thyroid gland, presumed multinodular goiter. Would consider nonemergent follow-up with thyroid ultrasound and/or nuclear medicine thyroid scan at some point to ensure benignity. Electronically Signed   By: Bary Richard M.D.   On: 02/04/2015 14:11   Dg Hip Unilat With Pelvis 2-3 Views Right  02/04/2015  CLINICAL DATA:  Fall, generalized right hip pain. History of Parkinson's disease. EXAM: DG HIP (WITH OR WITHOUT PELVIS) 2-3V RIGHT COMPARISON:  Plain film of the pelvis and right hip dated 07/14/2014. FINDINGS: Single view of the pelvis and two views of the right hip are provided. Osseous alignment is normal. No fracture line or displaced fracture fragment identified. No significant degenerative change at either hip joint. Probable mild degenerative change within the lower lumbar spine. Soft tissues about the pelvis and right hip are unremarkable. Faint calcific density underlying the right inferior pubic ramus suggests a developing chronic  calcific tendinopathy. IMPRESSION: No acute findings.  No osseous fracture or dislocation. Electronically Signed   By: Bary Richard M.D.   On: 02/04/2015 14:17      Assessment/Plan Active Problems:   Parkinson disease (HCC)  Impression  metabolic encephalopathy  secondary to potential increase in Sinemet? Looking through through the phone notes from neurologist and looks that patient gets confused when she goes above 2 tablets of Sinemet daily.   I will restart her on the regimen that worked for her in the hospital i.e. 3 tablets of Sinemet daily 7 a.m., 12, 5 PM and hope that she is able to tolerate the same under supervised medical care May benefit from re-addition of amantadine?  I have asked neurology to weigh in and given impression-appreciate Dr. Cheral Marker assistance in advance  Patient will be transferred to [facility] at their request for expediency and treatment No further imaging testing needed at this time I would hold off on the patient's doxylamine for now  Asymptomatic bacteriuria Patient has no fever no chills and has a cath specimen urine significant for moderate leukocytes only Repeat CBC plus differential in the a.m. and if she spikes fever would consider coverage CPAP one dose of Rocephin in the emergency room Would  not treat for bacteria at this stage  Stage II pressure ulcer medically stable at this time reevaluate with wound care of needed  Moderate malnutrition- Gradually diet as tolerated and allow regular diet   Given that she's had a fall we will ask therapy to see the patient She may benefit from placement No  number available to discuss with patient's daughter who is not at bedside any longer Admitted as observation status Presumed full code cannot confirm this patient unreliable Potential discharge in a.m.?

## 2015-02-04 NOTE — ED Notes (Signed)
Blood draw delayed due to pt in ct-scan

## 2015-02-04 NOTE — ED Notes (Signed)
Contacted family regarding patient refusing CT, Xrays and blood draw.  Daughter states that wants us to perform these tasks and that for her mother to be refusing that things be done "is not her normal"

## 2015-02-04 NOTE — ED Provider Notes (Signed)
CSN: [id number]     Arrival date & time 02/04/15  1102 History   First MD Initiated Contact with Patient 02/04/15 1203     Chief Complaint  Patient presents with  . Fall  . Head Injury     (Consider location/radiation/quality/duration/timing/severity/associated sxs/prior Treatment) HPI Patient presents to the emergency department from a fall that occurred prior to arrival.  The she is a poor historian but does say that she thinks she tripped and fell.  The daughter states that she seems more weak over the last few days and more confused and she felt she may have urinary tract infection, so she did ask spoke with the PCP who is a similar nurse to evaluate the patient.  Patient has had no vomiting, diarrhea, chest pain, shortness of breath, abdominal pain, back pain, neck pain, fever, incontinence, bloody stool, hematemesis, or syncope.  The patient did complain of burning with urination yesterday. Past Medical History  Diagnosis Date  . Anxiety   . Depression   . Parkinson disease [facility](HCC)    Past Surgical History  Procedure Laterality Date  . Denies surgical history     Family History  Problem Relation Age of Onset  . Dementia Neg Hx   . Parkinsonism Neg Hx    Social History  Substance Use Topics  . Smoking status: Former Smoker    Quit date: 03/04/1996  . Smokeless tobacco: None  . Alcohol Use: No   OB History    No data available     Review of Systems All other systems negative except as documented in the HPI. All pertinent positives and negatives as reviewed in the HPI.    Allergies  Review of patient's allergies indicates no known allergies.  Home Medications   Prior to Admission medications   Medication Sig Start Date End Date Taking? Authorizing Provider  busPIRone (BUSPAR) 15 MG tablet Take 15 mg by mouth 2 (two) times daily.   Yes Historical Provider, MD  carbidopa-levodopa (SINEMET IR) 25-100 MG tablet Take 1 tablet by mouth 3 (three) times daily. Patient  taking differently: Take 1 tablet by mouth 2 (two) times daily.  01/25/15  Yes Anson FretAntonia B Ahern, MD  DM-Doxylamine-Acetaminophen 15-6.25-325 MG/15ML LIQD Take 30 mLs by mouth at bedtime as needed (cough/cold symptoms.).   Yes Historical Provider, MD  mirtazapine (REMERON) 15 MG tablet Take 15 mg by mouth at bedtime.   Yes Historical Provider, MD  Multiple Vitamins-Minerals (EMERGEN-C IMMUNE PLUS PO) Take by mouth as directed.   Yes Historical Provider, MD  feeding supplement, ENSURE ENLIVE, (ENSURE ENLIVE) LIQD Take 237 mLs by mouth 2 (two) times daily between meals. 11/11/14   Vassie Lollarlos Madera, MD   BP 136/62 mmHg  Pulse 78  Temp(Src) 98.3 F (36.8 C) (Rectal)  Resp 16  SpO2 98% Physical Exam  Constitutional: She is oriented to person, place, and time. She appears well-developed and well-nourished. No distress.  HENT:  Head: Normocephalic and atraumatic.  Mouth/Throat: Oropharynx is clear and moist.  Eyes: Pupils are equal, round, and reactive to light.  Neck: Normal range of motion. Neck supple.  Cardiovascular: Normal rate, regular rhythm and normal heart sounds.  Exam reveals no gallop and no friction rub.   No murmur heard. Pulmonary/Chest: Effort normal and breath sounds normal. No respiratory distress. She has no wheezes.  Abdominal: Soft. Bowel sounds are normal. She exhibits no distension. There is no tenderness.  Neurological: She is alert and oriented to person, place, and time. She exhibits normal muscle  tone. Coordination normal.  Skin: Skin is warm and dry. No rash noted. No erythema.  Psychiatric: She has a normal mood and affect. Her behavior is normal.  Nursing note and vitals reviewed.   ED Course  Procedures (including critical care time) Labs Review Labs Reviewed  BASIC METABOLIC PANEL - Abnormal; Notable for the following:    Potassium 5.6 (*)    Creatinine, Ser 1.05 (*)    GFR calc non Af Amer 50 (*)    GFR calc Af Amer 57 (*)    All other components within  normal limits  URINALYSIS, ROUTINE W REFLEX MICROSCOPIC (NOT AT [facility]) - Abnormal; Notable for the following:    APPearance HAZY (*)    Hgb urine dipstick LARGE (*)    Leukocytes, UA MODERATE (*)    All other components within normal limits  CBC WITH DIFFERENTIAL/PLATELET - Abnormal; Notable for the following:    Monocytes Absolute 1.2 (*)    All other components within normal limits  URINE MICROSCOPIC-ADD ON - Abnormal; Notable for the following:    Bacteria, UA FEW (*)    All other components within normal limits  URINE CULTURE  CBC WITH DIFFERENTIAL/PLATELET  POTASSIUM    Imaging Review Ct Head Wo Contrast  02/04/2015  CLINICAL DATA:  Fall, denies loss of consciousness. Hit head and right side of face. EXAM: CT HEAD WITHOUT CONTRAST CT CERVICAL SPINE WITHOUT CONTRAST TECHNIQUE: Multidetector CT imaging of the head and cervical spine was performed following the standard protocol without intravenous contrast. Multiplanar CT image reconstructions of the cervical spine were also generated. COMPARISON:  Brain MRI dated 01/06/2015 and head CT dated 01/06/2015. FINDINGS: CT HEAD FINDINGS Again noted is mild generalized brain atrophy with commensurate dilatation of the ventricles and sulci. Mild chronic small vessel ischemic change again noted within the deep periventricular white matter regions bilaterally. There is no mass, hemorrhage, edema or other evidence of acute parenchymal abnormality. No extra-axial hemorrhage. No osseous fracture or dislocation seen. Superficial soft tissues are unremarkable. CT CERVICAL SPINE FINDINGS Degenerative changes are seen throughout the cervical spine, at least moderate in degree with associated disc space narrowings and osseous spurring. Disc-osteophytic bulges at each level but no more than mild central canal stenosis at any level. Mild levoscoliosis and mildly accentuated lordosis likely related to the underlying degenerative changes. No fracture line or  displaced fracture fragment identified. Facets are well aligned throughout. There is heterogeneous enlargement of the bilateral thyroid lobes, right greater than left. Paravertebral soft tissues are otherwise unremarkable. Scarring/fibrosis noted at each lung apex. IMPRESSION: 1. No evidence of acute intracranial abnormality. No intracranial hemorrhage or edema. No skull fracture. Mild atrophy and chronic ischemic changes, as detailed above. 2. Degenerative changes of the cervical spine, as detailed above. No fracture or acute subluxation identified within the cervical spine. 3. Heterogeneous enlargement of the thyroid gland, presumed multinodular goiter. Would consider nonemergent follow-up with thyroid ultrasound and/or nuclear medicine thyroid scan at some point to ensure benignity. Electronically Signed   By: Bary Richard M.D.   On: 02/04/2015 14:11   Ct Cervical Spine Wo Contrast  02/04/2015  CLINICAL DATA:  Fall, denies loss of consciousness. Hit head and right side of face. EXAM: CT HEAD WITHOUT CONTRAST CT CERVICAL SPINE WITHOUT CONTRAST TECHNIQUE: Multidetector CT imaging of the head and cervical spine was performed following the standard protocol without intravenous contrast. Multiplanar CT image reconstructions of the cervical spine were also generated. COMPARISON:  Brain MRI dated 01/06/2015 and head CT  dated 01/06/2015. FINDINGS: CT HEAD FINDINGS Again noted is mild generalized brain atrophy with commensurate dilatation of the ventricles and sulci. Mild chronic small vessel ischemic change again noted within the deep periventricular white matter regions bilaterally. There is no mass, hemorrhage, edema or other evidence of acute parenchymal abnormality. No extra-axial hemorrhage. No osseous fracture or dislocation seen. Superficial soft tissues are unremarkable. CT CERVICAL SPINE FINDINGS Degenerative changes are seen throughout the cervical spine, at least moderate in degree with associated disc  space narrowings and osseous spurring. Disc-osteophytic bulges at each level but no more than mild central canal stenosis at any level. Mild levoscoliosis and mildly accentuated lordosis likely related to the underlying degenerative changes. No fracture line or displaced fracture fragment identified. Facets are well aligned throughout. There is heterogeneous enlargement of the bilateral thyroid lobes, right greater than left. Paravertebral soft tissues are otherwise unremarkable. Scarring/fibrosis noted at each lung apex. IMPRESSION: 1. No evidence of acute intracranial abnormality. No intracranial hemorrhage or edema. No skull fracture. Mild atrophy and chronic ischemic changes, as detailed above. 2. Degenerative changes of the cervical spine, as detailed above. No fracture or acute subluxation identified within the cervical spine. 3. Heterogeneous enlargement of the thyroid gland, presumed multinodular goiter. Would consider nonemergent follow-up with thyroid ultrasound and/or nuclear medicine thyroid scan at some point to ensure benignity. Electronically Signed   By: Bary Richard M.D.   On: 02/04/2015 14:11   Dg Hip Unilat With Pelvis 2-3 Views Right  02/04/2015  CLINICAL DATA:  Fall, generalized right hip pain. History of Parkinson's disease. EXAM: DG HIP (WITH OR WITHOUT PELVIS) 2-3V RIGHT COMPARISON:  Plain film of the pelvis and right hip dated 07/14/2014. FINDINGS: Single view of the pelvis and two views of the right hip are provided. Osseous alignment is normal. No fracture line or displaced fracture fragment identified. No significant degenerative change at either hip joint. Probable mild degenerative change within the lower lumbar spine. Soft tissues about the pelvis and right hip are unremarkable. Faint calcific density underlying the right inferior pubic ramus suggests a developing chronic calcific tendinopathy. IMPRESSION: No acute findings.  No osseous fracture or dislocation. Electronically  Signed   By: Bary Richard M.D.   On: 02/04/2015 14:17   I have personally reviewed and evaluated these images and lab results as part of my medical decision-making.   Patient has had increased weakness over the last 2 days with a fall today.  The daughter states she seems somewhat more confused than normal plus more weakness.  Daughter states she had a hard time with her today due to the weakness and fall   Charlestine Night, PA-C 02/04/15 1646  Raeford Razor, MD 02/11/15 [phone]

## 2015-02-04 NOTE — Consult Note (Addendum)
NEURO HOSPITALIST CONSULT NOTE   Referring physician: Dr Verlon Au Reason for Consult: increasing confusion  HPI:                                                                                                                                          Susan Simmons is an 80 y.o. female with a past medical history significant for PD managed by Dr Jaynee Eagles at [facility], bipolar disease, pressure ulcer stage II, admission on 01/06/15 with change in mentation and a workup was unrevealing for CVA, brought in by daughter due to concern with increasing confusion. Patient doesn't seem to be able to reliably contribute to her medical history, daughter just left the hospital, and thus all history was obtained from her chart: " Patient presents to the emergency department from a fall that occurred prior to arrival. The she is a poor historian but does say that she thinks she tripped and fell. The daughter states that she seems more weak over the last few days and more confused and she felt she may have urinary tract infection, so she did ask spoke with the PCP who is a similar nurse to evaluate the patient. Patient has had no vomiting, diarrhea, chest pain, shortness of breath, abdominal pain, back pain, neck pain, fever, incontinence, bloody stool, hematemesis, or syncope. The patient did complain of burning with urination yesterday". In addition, " she was admitted 01/06/15 with change in mentation and a workup was done for CVA and other things. It was felt that this was primarily secondary to Parkinson's and was recommended at that time to continue Sinemet 1 tab 3 times a day 7 AM 12 PM 5 PM and one Sinemet CR 25/100 daily at bedtime and also amantadine was added twice a day Based on report from neurologist 12/20 patient was noncompliant with these recommendations and only taking her Sinemet IR twice daily 8:30 and 5 PM She was discharged back home.  It is noted that she was admitted with similar  findings 11/11/14. She was also admitted 08/04/14 for encephalopathy". I reviewed the available serologies and UA and they are unrevealing. CT brain was independently reviewed and showed no acute abnormality. It is worth noting that review of Dr Jaynee Eagles notes indicate that patient has been experiencing some clinical decline lately and has been reluctant to follow instructions regarding medication changes.    Past Medical History  Diagnosis Date  . Anxiety   . Depression   . Parkinson disease [facility])     Past Surgical History  Procedure Laterality Date  . Denies surgical history      Family History  Problem Relation Age of Onset  . Dementia Neg Hx   . Parkinsonism Neg Hx     Family History: unable to obtain due to mental  status   Social History:  reports that she quit smoking about 18 years ago. She does not have any smokeless tobacco history on file. She reports that she does not drink alcohol or use illicit drugs.  No Known Allergies  MEDICATIONS:                                                                                                                     I have reviewed the patient's current medications.   ROS: unable to obtain due to mental status                                                                                                                                    History obtained from chart review    Physical exam:  Constitutional: well developed, pleasant female in no apparent distress. Blood pressure 136/62, pulse 78, temperature 98.3 F (36.8 C), temperature source Rectal, resp. rate 16, SpO2 98 %. Eyes: no jaundice or exophthalmos.  Head: normocephalic. Neck: supple, no bruits, no JVD. Cardiac: no murmurs. Lungs: clear. Abdomen: soft, no tender, no mass. Extremities: no edema, clubbing, or cyanosis.  Skin: no rash  Neurologic Examination:                                                                                                       General: NAD Mental Status: Alert, disoriented to place-date-situation. Hypophonia without evidence of dysarthria or aphasia.  Able to follow 3 step commands without difficulty. Cranial Nerves: II: Discs flat bilaterally; Visual fields grossly normal, pupils equal, round, reactive to light and accommodation III,IV, VI: ptosis not present, extra-ocular motions intact bilaterally V,VII: smile symmetric, facial light touch sensation normal bilaterally VIII: hearing normal bilaterally IX,X: uvula rises symmetrically XI: bilateral shoulder shrug no tested XII: midline tongue extension without atrophy or fasciculations Motor: Moves all limbs without frank evidence of focal weakness Tone: mild cogwheeling Sensory: Pinprick and light touch intact throughout, bilaterally Deep Tendon Reflexes:  2 at the biceps, 1+ at the  patella bilaterally Plantars: Right: downgoing   Left: downgoing Cerebellar: normal finger-to-nose,  heel-to-shin no tested Gait:  No tested due to multiple leads     Lab Results  Component Value Date/Time   CHOL 162 01/07/2015 08:08 AM    Results for orders placed or performed during the hospital encounter of 02/04/15 (from the past 48 hour(s))  Basic metabolic panel     Status: Abnormal   Collection Time: 02/04/15  1:22 PM  Result Value Ref Range   Sodium 141 135 - 145 mmol/L   Potassium 5.6 (H) 3.5 - 5.1 mmol/L   Chloride 105 101 - 111 mmol/L   CO2 30 22 - 32 mmol/L   Glucose, Bld 98 65 - 99 mg/dL   BUN 18 6 - 20 mg/dL   Creatinine, Ser 1.05 (H) 0.44 - 1.00 mg/dL   Calcium 9.2 8.9 - 10.3 mg/dL   GFR calc non Af Amer 50 (L) >60 mL/min   GFR calc Af Amer 57 (L) >60 mL/min    Comment: (NOTE) The eGFR has been calculated using the CKD EPI equation. This calculation has not been validated in all clinical situations. eGFR's persistently <60 mL/min signify possible Chronic Kidney Disease.    Anion gap 6 5 - 15  Urinalysis, Routine w reflex microscopic      Status: Abnormal   Collection Time: 02/04/15  1:35 PM  Result Value Ref Range   Color, Urine YELLOW YELLOW   APPearance HAZY (A) CLEAR   Specific Gravity, Urine 1.010 1.005 - 1.030   pH 6.5 5.0 - 8.0   Glucose, UA NEGATIVE NEGATIVE mg/dL   Hgb urine dipstick LARGE (A) NEGATIVE   Bilirubin Urine NEGATIVE NEGATIVE   Ketones, ur NEGATIVE NEGATIVE mg/dL   Protein, ur NEGATIVE NEGATIVE mg/dL   Nitrite NEGATIVE NEGATIVE   Leukocytes, UA MODERATE (A) NEGATIVE  Urine microscopic-add on     Status: Abnormal   Collection Time: 02/04/15  1:35 PM  Result Value Ref Range   Squamous Epithelial / LPF NONE SEEN NONE SEEN   WBC, UA 6-30 0 - 5 WBC/hpf   RBC / HPF 0-5 0 - 5 RBC/hpf   Bacteria, UA FEW (A) NONE SEEN   Urine-Other LESS THAN 10 mL OF URINE SUBMITTED     Comment: MICROSCOPIC EXAM PERFORMED ON UNCONCENTRATED URINE  CBC with Differential/Platelet     Status: Abnormal   Collection Time: 02/04/15  2:17 PM  Result Value Ref Range   WBC 6.5 4.0 - 10.5 K/uL   RBC 4.67 3.87 - 5.11 MIL/uL   Hemoglobin 13.0 12.0 - 15.0 g/dL   HCT 41.6 36.0 - 46.0 %   MCV 89.1 78.0 - 100.0 fL   MCH 27.8 26.0 - 34.0 pg   MCHC 31.3 30.0 - 36.0 g/dL   RDW 13.5 11.5 - 15.5 %   Platelets 283 150 - 400 K/uL   Neutrophils Relative % 41 %   Neutro Abs 2.7 1.7 - 7.7 K/uL   Lymphocytes Relative 39 %   Lymphs Abs 2.5 0.7 - 4.0 K/uL   Monocytes Relative 18 %   Monocytes Absolute 1.2 (H) 0.1 - 1.0 K/uL   Eosinophils Relative 1 %   Eosinophils Absolute 0.0 0.0 - 0.7 K/uL   Basophils Relative 1 %   Basophils Absolute 0.0 0.0 - 0.1 K/uL  Potassium     Status: None   Collection Time: 02/04/15  4:04 PM  Result Value Ref Range   Potassium 4.4 3.5 - 5.1 mmol/L  Comment: DELTA CHECK NOTED REPEATED TO VERIFY     Ct Head Wo Contrast  02/04/2015  CLINICAL DATA:  Fall, denies loss of consciousness. Hit head and right side of face. EXAM: CT HEAD WITHOUT CONTRAST CT CERVICAL SPINE WITHOUT CONTRAST TECHNIQUE:  Multidetector CT imaging of the head and cervical spine was performed following the standard protocol without intravenous contrast. Multiplanar CT image reconstructions of the cervical spine were also generated. COMPARISON:  Brain MRI dated 01/06/2015 and head CT dated 01/06/2015. FINDINGS: CT HEAD FINDINGS Again noted is mild generalized brain atrophy with commensurate dilatation of the ventricles and sulci. Mild chronic small vessel ischemic change again noted within the deep periventricular white matter regions bilaterally. There is no mass, hemorrhage, edema or other evidence of acute parenchymal abnormality. No extra-axial hemorrhage. No osseous fracture or dislocation seen. Superficial soft tissues are unremarkable. CT CERVICAL SPINE FINDINGS Degenerative changes are seen throughout the cervical spine, at least moderate in degree with associated disc space narrowings and osseous spurring. Disc-osteophytic bulges at each level but no more than mild central canal stenosis at any level. Mild levoscoliosis and mildly accentuated lordosis likely related to the underlying degenerative changes. No fracture line or displaced fracture fragment identified. Facets are well aligned throughout. There is heterogeneous enlargement of the bilateral thyroid lobes, right greater than left. Paravertebral soft tissues are otherwise unremarkable. Scarring/fibrosis noted at each lung apex. IMPRESSION: 1. No evidence of acute intracranial abnormality. No intracranial hemorrhage or edema. No skull fracture. Mild atrophy and chronic ischemic changes, as detailed above. 2. Degenerative changes of the cervical spine, as detailed above. No fracture or acute subluxation identified within the cervical spine. 3. Heterogeneous enlargement of the thyroid gland, presumed multinodular goiter. Would consider nonemergent follow-up with thyroid ultrasound and/or nuclear medicine thyroid scan at some point to ensure benignity. Electronically Signed    By: Franki Cabot M.D.   On: 02/04/2015 14:11   Ct Cervical Spine Wo Contrast  02/04/2015  CLINICAL DATA:  Fall, denies loss of consciousness. Hit head and right side of face. EXAM: CT HEAD WITHOUT CONTRAST CT CERVICAL SPINE WITHOUT CONTRAST TECHNIQUE: Multidetector CT imaging of the head and cervical spine was performed following the standard protocol without intravenous contrast. Multiplanar CT image reconstructions of the cervical spine were also generated. COMPARISON:  Brain MRI dated 01/06/2015 and head CT dated 01/06/2015. FINDINGS: CT HEAD FINDINGS Again noted is mild generalized brain atrophy with commensurate dilatation of the ventricles and sulci. Mild chronic small vessel ischemic change again noted within the deep periventricular white matter regions bilaterally. There is no mass, hemorrhage, edema or other evidence of acute parenchymal abnormality. No extra-axial hemorrhage. No osseous fracture or dislocation seen. Superficial soft tissues are unremarkable. CT CERVICAL SPINE FINDINGS Degenerative changes are seen throughout the cervical spine, at least moderate in degree with associated disc space narrowings and osseous spurring. Disc-osteophytic bulges at each level but no more than mild central canal stenosis at any level. Mild levoscoliosis and mildly accentuated lordosis likely related to the underlying degenerative changes. No fracture line or displaced fracture fragment identified. Facets are well aligned throughout. There is heterogeneous enlargement of the bilateral thyroid lobes, right greater than left. Paravertebral soft tissues are otherwise unremarkable. Scarring/fibrosis noted at each lung apex. IMPRESSION: 1. No evidence of acute intracranial abnormality. No intracranial hemorrhage or edema. No skull fracture. Mild atrophy and chronic ischemic changes, as detailed above. 2. Degenerative changes of the cervical spine, as detailed above. No fracture or acute subluxation identified  within  the cervical spine. 3. Heterogeneous enlargement of the thyroid gland, presumed multinodular goiter. Would consider nonemergent follow-up with thyroid ultrasound and/or nuclear medicine thyroid scan at some point to ensure benignity. Electronically Signed   By: Franki Cabot M.D.   On: 02/04/2015 14:11   Dg Hip Unilat With Pelvis 2-3 Views Right  02/04/2015  CLINICAL DATA:  Fall, generalized right hip pain. History of Parkinson's disease. EXAM: DG HIP (WITH OR WITHOUT PELVIS) 2-3V RIGHT COMPARISON:  Plain film of the pelvis and right hip dated 07/14/2014. FINDINGS: Single view of the pelvis and two views of the right hip are provided. Osseous alignment is normal. No fracture line or displaced fracture fragment identified. No significant degenerative change at either hip joint. Probable mild degenerative change within the lower lumbar spine. Soft tissues about the pelvis and right hip are unremarkable. Faint calcific density underlying the right inferior pubic ramus suggests a developing chronic calcific tendinopathy. IMPRESSION: No acute findings.  No osseous fracture or dislocation. Electronically Signed   By: Franki Cabot M.D.   On: 02/04/2015 14:17   Assessment/Plan: 80 y/o with PD, bipolar disorder, and probable MCI in the context of PD, brought in due to concern with increasing confusion. It is worth noting that there is a component of poor patient adherence to instructions about management of her PD medications. Serologies, UA, and CT brain are unrevealing. Can not exclude the possibility of increasing confusion due to progression of underlying PD (as documented by her outpatient neurologist). Agree with Levodopa 3 times daily as recommended before. She is not experiencing dyskinesias or worrisome motor fluctuations at this moment, hence will defer resuming amantadine for now. No further neuroimaging or EEG unless her neuro status worsens. Will follow up.   Dorian Pod,  MD 02/04/2015, 5:42 PM  [facility]

## 2015-02-04 NOTE — ED Provider Notes (Addendum)
Medical screening examination/treatment/procedure(s) were conducted as a shared visit with non-physician practitioner(s) and myself.  I personally evaluated the patient during the encounter.   EKG Interpretation None     80yF with fall. Pt poor historian. Not able to give me much of a history. Hx of parkinson's. Not sure if this is her baseline or more and acute issue. No family at bedside on my evaluation. Lives at home and nursing to call daughter to return to ED. Nursing reporting that patient much more verbally responsive earlier when daughter around. Need to get a better sense of her baseline to determine disposition.   Will check rectal temp. UA somewhat suggestive of UTI. In elderly patient with a fall and unreliable ROS, will tx. Send urine culture. Only mild elevation in Cr, but above baseline. Mild hypotension as well. IVF.    Raeford RazorStephen Bryer Gottsch, MD 02/04/15 1547  4:22 PM Daughter at bedside. Fell from toilet this morning and difficult to get her back up. Has been complaining of burning with urination since yesterday. Daughter reports she is sometimes selective of when and whom she will talk to but has seemed "slower" and "more confused" recently.    Raeford RazorStephen Andrena Margerum, MD 02/04/15 [phone]

## 2015-02-05 ENCOUNTER — Observation Stay (HOSPITAL_COMMUNITY): Payer: Medicare Other

## 2015-02-05 ENCOUNTER — Encounter (HOSPITAL_COMMUNITY): Payer: Self-pay | Admitting: General Practice

## 2015-02-05 DIAGNOSIS — N390 Urinary tract infection, site not specified: Secondary | ICD-10-CM | POA: Diagnosis present

## 2015-02-05 DIAGNOSIS — G20 Parkinson's disease: Secondary | ICD-10-CM | POA: Diagnosis not present

## 2015-02-05 LAB — CBC
HCT: 36.1 % (ref 36.0–46.0)
Hemoglobin: 11.5 g/dL — ABNORMAL LOW (ref 12.0–15.0)
MCH: 28 pg (ref 26.0–34.0)
MCHC: 31.9 g/dL (ref 30.0–36.0)
MCV: 88 fL (ref 78.0–100.0)
PLATELETS: 240 10*3/uL (ref 150–400)
RBC: 4.1 MIL/uL (ref 3.87–5.11)
RDW: 13.5 % (ref 11.5–15.5)
WBC: 3.9 10*3/uL — AB (ref 4.0–10.5)

## 2015-02-05 LAB — CREATININE, SERUM
CREATININE: 0.88 mg/dL (ref 0.44–1.00)
GFR calc non Af Amer: >60 mL/min (ref >60)

## 2015-02-05 MED ORDER — BUSPIRONE HCL 15 MG PO TABS
15.0000 mg | ORAL_TABLET | Freq: Two times a day (BID) | ORAL | Status: DC
  Administered 2015-02-05 – 2015-02-07 (×5): 15 mg via ORAL
  Filled 2015-02-05 (×6): qty 1

## 2015-02-05 MED ORDER — ENSURE ENLIVE PO LIQD
237.0000 mL | Freq: Two times a day (BID) | ORAL | Status: DC
  Administered 2015-02-07: 237 mL via ORAL

## 2015-02-05 MED ORDER — ACETAMINOPHEN 325 MG PO TABS: 650.0000 mg | ORAL_TABLET | Freq: Four times a day (QID) | ORAL | Status: DC | PRN

## 2015-02-05 MED ORDER — HYDROCODONE-ACETAMINOPHEN 5-325 MG PO TABS: 1.0000 | ORAL_TABLET | ORAL | Status: DC | PRN

## 2015-02-05 MED ORDER — MIRTAZAPINE 15 MG PO TABS
15.0000 mg | ORAL_TABLET | Freq: Every day | ORAL | Status: DC
  Administered 2015-02-05 – 2015-02-06 (×3): 15 mg via ORAL
  Filled 2015-02-05 (×3): qty 1

## 2015-02-05 MED ORDER — CARBIDOPA-LEVODOPA 25-100 MG PO TABS
1.0000 | ORAL_TABLET | Freq: Three times a day (TID) | ORAL | Status: DC
  Administered 2015-02-05 – 2015-02-07 (×6): 1 via ORAL
  Filled 2015-02-05 (×7): qty 1

## 2015-02-05 MED ORDER — ENOXAPARIN SODIUM 40 MG/0.4ML ~~LOC~~ SOLN
40.0000 mg | Freq: Every day | SUBCUTANEOUS | Status: DC
  Administered 2015-02-05 – 2015-02-06 (×3): 40 mg via SUBCUTANEOUS
  Filled 2015-02-05 (×3): qty 0.4

## 2015-02-05 NOTE — Progress Notes (Signed)
[facility] hospital[facility] progress note. Chief complaint. Transfer note. History of present illness. This 80 [facility] for altered mental status. Admission impressions were of metabolic encephalopathy due to possible increase in Sinemet. Case was discussed with neurology [facility]. Patient is now arrived in transfer and I'm seeing her at bedside to ensure she remains clinically stable and that her orders have transferred appropriately. Patient is verbal but a poor historian. She complains of pain to the right foot and right hip. Physical exam. Vital signs. Temperature 98.6, pulse 71, respiration 16, blood pressure 127/65. O2 sats 100%. General appearance. Frail elderly female who is alert and in no distress. Cardiac. Rate and rhythm regular with 3/6 systolic ejection murmur. Lungs. Breath sounds clear. Abdomen. Soft with positive bowel sounds. Impression/plan. Problem #1. Altered mental status. As yet etiology unclear. We'll follow for neurology's recommendations. Patient appears clinically stable post transfer. All orders appear to of transferred appropriately.

## 2015-02-05 NOTE — Progress Notes (Signed)
Patient Demographics:    Susan Simmons, is a 80 y.o. female, DOB - [birth year], WJX:[id number]  Admit date - 02/04/2015   Admitting Physician Rhetta Mura, MD  Outpatient Primary MD for the patient is Clayborn Heron, MD  LOS -    Chief Complaint  Patient presents with  . Fall  . Head Injury        Subjective:    Susan Simmons today has, No headache, No chest pain, No abdominal pain - No Nausea, No new weakness tingling or numbness, No Cough - SOB.    Assessment  & Plan :    Active Problems:   Acute encephalopathy   Parkinson disease (HCC)   UTI (urinary tract infection)   1. UTI and Parkinson medication induced encephalopathy. In a patient with underlying Parkinson's with dementia, now appears close to baseline, Parkinson's medications have been titrated by the neurologist. PT eval, likely require placement.  2. UTI. Continue Rocephin monitor cultures.  3. History of fall. No aches or pains, head CT, neck CT, right hip and pelvis x-ray, right foot x-ray. All nonacute.   4. Incidental finding of multinodular with her on neck CT. Outpatient age-appropriate workup by PCP.   5. Moderate degenerative malnutrition. Protein supplements restarted.     Code Status : Full  Family Communication  : Daughter over the phone  Disposition Plan  : SNF Monday  Consults  :  None  Procedures  :   CT head and neck nonacute. Multinodular goiter noted incidentally.  DVT Prophylaxis  :  Lovenox -   Lab Results  Component Value Date   PLT 240 02/05/2015    Inpatient Medications  Scheduled Meds: . busPIRone  15 mg Oral BID  . carbidopa-levodopa  1 tablet Oral TID  . enoxaparin (LOVENOX) injection  40 mg Subcutaneous QHS  . mirtazapine  15 mg Oral QHS   Continuous  Infusions:  PRN Meds:.acetaminophen  Antibiotics  :    Anti-infectives    Start     Dose/Rate Route Frequency Ordered Stop   02/04/15 1530  cefTRIAXone (ROCEPHIN) 1 g in dextrose 5 % 50 mL IVPB     1 g 100 mL/hr over 30 Minutes Intravenous  Once 02/04/15 1526 02/04/15 1635        Objective:   Filed Vitals:   02/04/15 2130 02/04/15 2306 02/05/15 0006 02/05/15 0537  BP: 114/75 93/49 127/65 129/54  Pulse: 66 66 71 70  Temp:   98.6 F (37 C) 97.9 F (36.6 C)  TempSrc:   Oral Oral  Resp: Weight:   50.077 kg (110 lb 6.4 oz)   SpO2: 99% 98% 100% 92%    Wt Readings from Last 3 Encounters:  02/05/15 50.077 kg (110 lb 6.4 oz)  01/25/15 54.522 kg (120 lb 3.2 oz)  01/06/15 52.9 kg (116 lb 10 oz)    No intake or output data in the 24 hours ending 02/05/15 1120   Physical Exam  Awake, presently confused oriented 2, No new F.N deficits, Normal affect [place].AT,PERRAL Supple Neck,No JVD, No cervical lymphadenopathy appriciated.  Symmetrical Chest wall movement, Good air movement bilaterally, CTAB RRR,No Gallops,Rubs or new Murmurs, No Parasternal Heave +ve B.Sounds, Abd Soft, No tenderness, No organomegaly  appriciated, No rebound - guarding or rigidity. No Cyanosis, Clubbing or edema, No new Rash or bruise      Data Review:   Micro Results No results found for this or any previous visit (from the past 240 hour(s)).  Radiology Reports   Ct Cervical Spine Wo Contrast  02/04/2015  CLINICAL DATA:  Fall, denies loss of consciousness. Hit head and right side of face. EXAM: CT HEAD WITHOUT CONTRAST CT CERVICAL SPINE WITHOUT CONTRAST TECHNIQUE: Multidetector CT imaging of the head and cervical spine was performed following the standard protocol without intravenous contrast. Multiplanar CT image reconstructions of the cervical spine were also generated. COMPARISON:  Brain MRI dated 01/06/2015 and head CT dated 01/06/2015. FINDINGS: CT HEAD FINDINGS Again noted is mild  generalized brain atrophy with commensurate dilatation of the ventricles and sulci. Mild chronic small vessel ischemic change again noted within the deep periventricular white matter regions bilaterally. There is no mass, hemorrhage, edema or other evidence of acute parenchymal abnormality. No extra-axial hemorrhage. No osseous fracture or dislocation seen. Superficial soft tissues are unremarkable. CT CERVICAL SPINE FINDINGS Degenerative changes are seen throughout the cervical spine, at least moderate in degree with associated disc space narrowings and osseous spurring. Disc-osteophytic bulges at each level but no more than mild central canal stenosis at any level. Mild levoscoliosis and mildly accentuated lordosis likely related to the underlying degenerative changes. No fracture line or displaced fracture fragment identified. Facets are well aligned throughout. There is heterogeneous enlargement of the bilateral thyroid lobes, right greater than left. Paravertebral soft tissues are otherwise unremarkable. Scarring/fibrosis noted at each lung apex. IMPRESSION: 1. No evidence of acute intracranial abnormality. No intracranial hemorrhage or edema. No skull fracture. Mild atrophy and chronic ischemic changes, as detailed above. 2. Degenerative changes of the cervical spine, as detailed above. No fracture or acute subluxation identified within the cervical spine. 3. Heterogeneous enlargement of the thyroid gland, presumed multinodular goiter. Would consider nonemergent follow-up with thyroid ultrasound and/or nuclear medicine thyroid scan at some point to ensure benignity. Electronically Signed   By: Bary RichardStan  Maynard M.D.   On: 02/04/2015 14:11      Dg Foot Complete Right  02/05/2015  CLINICAL DATA:  Foot pain secondary to a fall. EXAM: RIGHT FOOT COMPLETE - 3+ VIEW COMPARISON:  Ankle radiographs dated 07/26/2014 FINDINGS: There is no evidence of fracture or dislocation. Slight degenerative changes of the head of  first metatarsal. Small calcaneal enthesophytes. IMPRESSION: No acute abnormality. Electronically Signed   By: Francene BoyersJames  Maxwell M.D.   On: 02/05/2015 08:30   Dg Hip Unilat With Pelvis 2-3 Views Right  02/04/2015  CLINICAL DATA:  Fall, generalized right hip pain. History of Parkinson's disease. EXAM: DG HIP (WITH OR WITHOUT PELVIS) 2-3V RIGHT COMPARISON:  Plain film of the pelvis and right hip dated 07/14/2014. FINDINGS: Single view of the pelvis and two views of the right hip are provided. Osseous alignment is normal. No fracture line or displaced fracture fragment identified. No significant degenerative change at either hip joint. Probable mild degenerative change within the lower lumbar spine. Soft tissues about the pelvis and right hip are unremarkable. Faint calcific density underlying the right inferior pubic ramus suggests a developing chronic calcific tendinopathy. IMPRESSION: No acute findings.  No osseous fracture or dislocation. Electronically Signed   By: Bary RichardStan  Maynard M.D.   On: 02/04/2015 14:17     CBC  Recent Labs Lab 02/04/15 1417 02/05/15 0104  WBC 6.5 3.9*  HGB 13.0 11.5*  HCT 41.6  36.1  PLT 283 240  MCV 89.1 88.0  MCH 27.8 28.0  MCHC 31.3 31.9  RDW 13.5 13.5  LYMPHSABS 2.5  --   MONOABS 1.2*  --   EOSABS 0.0  --   BASOSABS 0.0  --     Chemistries   Recent Labs Lab 02/04/15 1322 02/04/15 1604 02/05/15 0104  NA 141  --   --   K 5.6* 4.4  --   CL 105  --   --   CO2 30  --   --   GLUCOSE 98  --   --   BUN 18  --   --   CREATININE 1.05*  --  0.88  CALCIUM 9.2  --   --    ------------------------------------------------------------------------------------------------------------------ estimated creatinine clearance is 41.7 mL/min (by C-G formula based on Cr of 0.88). ------------------------------------------------------------------------------------------------------------------ No results for input(s): HGBA1C in the last 72  hours. ------------------------------------------------------------------------------------------------------------------ No results for input(s): CHOL, HDL, LDLCALC, TRIG, CHOLHDL, LDLDIRECT in the last 72 hours. ------------------------------------------------------------------------------------------------------------------ No results for input(s): TSH, T4TOTAL, T3FREE, THYROIDAB in the last 72 hours.  Invalid input(s): FREET3 ------------------------------------------------------------------------------------------------------------------ No results for input(s): VITAMINB12, FOLATE, FERRITIN, TIBC, IRON, RETICCTPCT in the last 72 hours.  Coagulation profile No results for input(s): INR, PROTIME in the last 168 hours.  No results for input(s): DDIMER in the last 72 hours.  Cardiac Enzymes No results for input(s): CKMB, TROPONINI, MYOGLOBIN in the last 168 hours.  Invalid input(s): CK ------------------------------------------------------------------------------------------------------------------ Invalid input(s): POCBNP   Time Spent in minutes   35   SINGH,PRASHANT K M.D on 02/05/2015 at 11:20 AM  Between 7am to 7pm - Pager - [phone]  After 7pm go to [email or web address] - password [facility]  [facility] -  Office  [phone]

## 2015-02-05 NOTE — Progress Notes (Signed)
Utilization Review Completed.Susan Simmons T12/31/2016  

## 2015-02-05 NOTE — Evaluation (Signed)
Physical Therapy Evaluation Patient Details Name: Lilliauna Van MRN: [id number] DOB: 12-26-1934 Today's Date: 02/05/2015   History of Present Illness  pt presents with AMS and Encephalopathy.  pt with hx of Parkinson's Disease.  Clinical Impression  Pt globally weak and debilitated.  Pt requiring A for all aspects of mobility.  Pt oriented to self and that she is in hospital, but unsure which hospital or why she is here.  No family present, but would need to confirm with family their ability to care for pt at home and if they wish for her to return to home.  Pt would benefit from SNF level of care or if family able to provide 24hr A would need HHPT and Aide.  Will continue to follow.      Follow Up Recommendations SNF (Pending family ability to provide care at home)    Equipment Recommendations  Rolling walker with 5" wheels;3in1 (PT);Wheelchair (measurements PT);Wheelchair cushion (measurements PT) (IF D/C to home)    Recommendations for Other Services       Precautions / Restrictions Precautions Precautions: Fall Restrictions Weight Bearing Restrictions: No      Mobility  Bed Mobility Overal bed mobility: Needs Assistance Bed Mobility: Supine to Sit     Supine to sit: Mod assist     General bed mobility comments: A with bringing LEs OOB and with bringing trunk up to sitting.    Transfers Overall transfer level: Needs assistance Equipment used: Rolling walker (2 wheeled);1 person hand held assist Transfers: Sit to/from [employer] Sit to Stand: Mod assist Stand pivot transfers: Mod assist       General transfer comment: Attempted to stand with RW requiring ModA for standing and total A for peri hygiene.  SPT without AD still requiring ModA.  pt with strong posterior lean and difficulty bringing balance over BOS.    Ambulation/Gait                Stairs            Wheelchair Mobility    Modified Rankin (Stroke Patients Only)       Balance Overall balance assessment: Needs assistance;History of Falls Sitting-balance support: Bilateral upper extremity supported;Feet supported Sitting balance-Leahy Scale: Fair   Postural control: Posterior lean Standing balance support: During functional activity Standing balance-Leahy Scale: Poor                               Pertinent Vitals/Pain Pain Assessment: No/denies pain    Home Living Family/patient expects to be discharged to:: Skilled nursing facility                 Additional Comments: No family present and pt not reliable historian.    Prior Function           Comments: Unclear level of A needed at Baseline.       Hand Dominance        Extremity/Trunk Assessment   Upper Extremity Assessment: Defer to OT evaluation           Lower Extremity Assessment: Generalized weakness (Increased rigidity throughout LEs.)      Cervical / Trunk Assessment: Kyphotic  Communication   Communication:  (Soft spoken)  Cognition Arousal/Alertness: Awake/alert Behavior During Therapy: Flat affect Overall Cognitive Status: No family/caregiver present to determine baseline cognitive functioning  General Comments      Exercises        Assessment/Plan    PT Assessment Patient needs continued PT services  PT Diagnosis Difficulty walking;Generalized weakness   PT Problem List Decreased strength;Decreased activity tolerance;Decreased balance;Decreased mobility;Decreased coordination;Decreased knowledge of use of DME;Impaired tone  PT Treatment Interventions DME instruction;Gait training;Stair training;Functional mobility training;Therapeutic activities;Therapeutic exercise;Balance training;Neuromuscular re-education;Patient/family education;Cognitive remediation   PT Goals (Current goals can be found in the Care Plan section) Acute Rehab PT Goals Patient Stated Goal: Pt not stating. PT Goal Formulation: With  patient Time For Goal Achievement: 02/19/15 Potential to Achieve Goals: Good    Frequency Min 3X/week   Barriers to discharge        Co-evaluation               End of Session Equipment Utilized During Treatment: Gait belt Activity Tolerance: Patient limited by fatigue Patient left: in chair;with call bell/phone within reach;with chair alarm set Nurse Communication: Mobility status    Functional Assessment Tool Used: Clinical Judgement Functional Limitation: Mobility: Walking and moving around Mobility: Walking and Moving Around Current Status [phone]([id number]): At least 40 percent but less than 60 percent impaired, limited or restricted Mobility: Walking and Moving Around Goal Status [phone]([id number]): At least 1 percent but less than 20 percent impaired, limited or restricted    Time: 0755-0819 PT Time Calculation (min) (ACUTE ONLY): 24 min   Charges:   PT Evaluation $Initial PT Evaluation Tier I: 1 Procedure PT Treatments $Therapeutic Activity: 8-22 mins   PT G Codes:   PT G-Codes **NOT FOR INPATIENT CLASS** Functional Assessment Tool Used: Clinical Judgement Functional Limitation: Mobility: Walking and moving around Mobility: Walking and Moving Around Current Status ([phone]): At least 40 percent but less than 60 percent impaired, limited or restricted Mobility: Walking and Moving Around Goal Status [phone]([id number]): At least 1 percent but less than 20 percent impaired, limited or restricted    Sunny SchleinRitenour, Aquila Delaughter F, [place] CarolinaPT [phone] 02/05/2015, 8:28 AM

## 2015-02-05 NOTE — Progress Notes (Signed)
[name] is a [age] y.o. female patient admitted from ED awake, alert - oriented  X 2 - no acute distress noted.  VSS - Blood pressure 127/65, pulse 71, temperature 98.6 F (37 C), temperature source Oral, resp. rate 16, SpO2 100 %.    IV in place, occlusive dsg intact without redness.  Orientation to room, and floor completed with information packet given to patient/family. Admission INP armband ID verified with patient/family, and in place.   SR up x 2, fall assessment complete, with patient verbalized understanding to call nsg before up out of bed.  Call light within reach, patient able to voice, and demonstrate understanding.  Skin, clean-dry- intact, area of redness and pain on right hip, MD put in orders for an x-ray. No evidence of skin break down noted on exam, old/healed pressure wound noted on sacrum.   Will cont to eval and treat per MD orders.  Otis Dialsassandra J Zabdi Mis, RN 02/05/2015 12:11 AM

## 2015-02-06 DIAGNOSIS — G20 Parkinson's disease: Secondary | ICD-10-CM | POA: Diagnosis not present

## 2015-02-06 DIAGNOSIS — G9340 Encephalopathy, unspecified: Secondary | ICD-10-CM | POA: Diagnosis not present

## 2015-02-06 MED ORDER — HALOPERIDOL LACTATE 5 MG/ML IJ SOLN
2.0000 mg | Freq: Four times a day (QID) | INTRAMUSCULAR | Status: DC | PRN
  Administered 2015-02-06: 2 mg via INTRAVENOUS
  Filled 2015-02-06: qty 1

## 2015-02-06 NOTE — Progress Notes (Signed)
Subjective: No recurrence of dyskinesia reported since stopping amantadine. Mental status continues to improve.  Objective: Current vital signs: BP 136/48 mmHg  Pulse 72  Temp(Src) 98.6 F (37 C) (Oral)  Resp 20  Ht   Wt 50.077 kg (110 lb 6.4 oz)  SpO2 100%  Neurologic Exam: Patient was alert and in no acute distress. She was oriented to correct month but disoriented to her correct age. Extraocular movements were full and conjugate. Face was symmetrical. Affect was flat area Speech was minimally dysarthric. No tremor at rest was noted and minimal action tremor of upper extremities noted. Muscle tone was mildly increased throughout symmetrical.  Medications: I have reviewed the patient's current medications.  Assessment/Plan: 81 year old lady with Parkinson's disease admitted for new onset dyskinesias and altered mental status with worsening of confusion. Patient is markedly improved. Amantadine was discontinued. She is no longer showing any signs of dyskinesia and confusion has lessened.  No further acute neurological intervention is warranted at this point. We will plan to see her in follow-up on an as-needed basis during the remainder of this hospital stay. Recommend follow-up with her outpatient neurologist following discharge.  C.R. Roseanne RenoStewart, MD [facility] [phone]  02/06/2015  9:47 AM

## 2015-02-06 NOTE — Progress Notes (Signed)
Patient Demographics:    Susan Simmons, is a 81 y.o. female, DOB - September 06, 1934, ZOX:[id number]  Admit date - 02/04/2015   Admitting Physician Rhetta Mura, MD  Outpatient Primary MD for the patient is Clayborn Heron, MD  LOS -    Chief Complaint  Patient presents with  . Fall  . Head Injury        Subjective:    Susan Simmons today has, No headache, No chest pain, No abdominal pain - No Nausea, No new weakness tingling or numbness, No Cough - SOB.    Assessment  & Plan :     1. UTI and Parkinson medication induced encephalopathy. In a patient with underlying Parkinson's with dementia, now appears close to baseline, Parkinson's medications have been titrated by the neurologist. PT eval, likely require placement. Patient now is likely at her baseline, per neurologist who saw the patient upon admission she has remarkably improved on 02/06/2015.   2. UTI. Continue Rocephin monitor cultures.  3. History of fall. No aches or pains, head CT, neck CT, right hip and pelvis x-ray, right foot x-ray. All nonacute.  4. Incidental finding of multinodular with her on neck CT. Outpatient age-appropriate workup by PCP.  5. Moderate degenerative malnutrition. Protein supplements restarted.     Code Status : Full  Family Communication  : Daughter over the phone  Disposition Plan  : SNF Monday  Consults  :  None  Procedures  :   CT head and neck nonacute. Multinodular goiter noted incidentally.  DVT Prophylaxis  :  Lovenox   Lab Results  Component Value Date   PLT 240 02/05/2015    Inpatient Medications  Scheduled Meds: . busPIRone  15 mg Oral BID  . carbidopa-levodopa  1 tablet Oral TID  . enoxaparin (LOVENOX) injection  40 mg Subcutaneous QHS  . feeding supplement (ENSURE  ENLIVE)  237 mL Oral BID BM  . mirtazapine  15 mg Oral QHS   Continuous Infusions:  PRN Meds:.acetaminophen  Antibiotics  :    Anti-infectives    Start     Dose/Rate Route Frequency Ordered Stop   02/04/15 1530  cefTRIAXone (ROCEPHIN) 1 g in dextrose 5 % 50 mL IVPB     1 g 100 mL/hr over 30 Minutes Intravenous  Once 02/04/15 1526 02/04/15 1635        Objective:   Filed Vitals:   02/05/15 0537 02/05/15 1458 02/05/15 2059 02/06/15 0504  BP: 129/54 100/29 108/53 136/48  Pulse: 70 76 73 72  Temp: 97.9 F (36.6 C) 97.8 F (36.6 C) 97.7 F (36.5 C) 98.6 F (37 C)  TempSrc: Oral  Oral Oral  Resp: 16 18 18 20   Weight:      SpO2: 92% 98% 100% 100%    Wt Readings from Last 3 Encounters:  02/05/15 50.077 kg (110 lb 6.4 oz)  01/25/15 54.522 kg (120 lb 3.2 oz)  01/06/15 52.9 kg (116 lb 10 oz)     Intake/Output Summary (Last 24 hours) at 02/06/15 1049 Last data filed at 02/06/15 0636  Gross per 24 hour  Intake    240 ml  Output    500 ml  Net   -260 ml     Physical Exam  Awake,  presently confused oriented 2, No new F.N deficits, Normal affect Wilcox.AT,PERRAL Supple Neck,No JVD, No cervical lymphadenopathy appriciated.  Symmetrical Chest wall movement, Good air movement bilaterally, CTAB RRR,No Gallops,Rubs or new Murmurs, No Parasternal Heave +ve B.Sounds, Abd Soft, No tenderness, No organomegaly appriciated, No rebound - guarding or rigidity. No Cyanosis, Clubbing or edema, No new Rash or bruise      Data Review:   Micro Results Recent Results (from the past 240 hour(s))  Urine culture     Status: None (Preliminary result)   Collection Time: 02/04/15  1:35 PM  Result Value Ref Range Status   Specimen Description URINE, CATHETERIZED  Final   Special Requests NONE  Final   Culture   Final    TOO YOUNG TO READ [facility]    Report Status PENDING  Incomplete    Radiology Reports   Ct Cervical Spine Wo Contrast  02/04/2015  CLINICAL  DATA:  Fall, denies loss of consciousness. Hit head and right side of face. EXAM: CT HEAD WITHOUT CONTRAST CT CERVICAL SPINE WITHOUT CONTRAST TECHNIQUE: Multidetector CT imaging of the head and cervical spine was performed following the standard protocol without intravenous contrast. Multiplanar CT image reconstructions of the cervical spine were also generated. COMPARISON:  Brain MRI dated 01/06/2015 and head CT dated 01/06/2015. FINDINGS: CT HEAD FINDINGS Again noted is mild generalized brain atrophy with commensurate dilatation of the ventricles and sulci. Mild chronic small vessel ischemic change again noted within the deep periventricular white matter regions bilaterally. There is no mass, hemorrhage, edema or other evidence of acute parenchymal abnormality. No extra-axial hemorrhage. No osseous fracture or dislocation seen. Superficial soft tissues are unremarkable. CT CERVICAL SPINE FINDINGS Degenerative changes are seen throughout the cervical spine, at least moderate in degree with associated disc space narrowings and osseous spurring. Disc-osteophytic bulges at each level but no more than mild central canal stenosis at any level. Mild levoscoliosis and mildly accentuated lordosis likely related to the underlying degenerative changes. No fracture line or displaced fracture fragment identified. Facets are well aligned throughout. There is heterogeneous enlargement of the bilateral thyroid lobes, right greater than left. Paravertebral soft tissues are otherwise unremarkable. Scarring/fibrosis noted at each lung apex. IMPRESSION: 1. No evidence of acute intracranial abnormality. No intracranial hemorrhage or edema. No skull fracture. Mild atrophy and chronic ischemic changes, as detailed above. 2. Degenerative changes of the cervical spine, as detailed above. No fracture or acute subluxation identified within the cervical spine. 3. Heterogeneous enlargement of the thyroid gland, presumed multinodular goiter.  Would consider nonemergent follow-up with thyroid ultrasound and/or nuclear medicine thyroid scan at some point to ensure benignity. Electronically Signed   By: Bary RichardStan  Maynard M.D.   On: 02/04/2015 14:11      Dg Foot Complete Right  02/05/2015  CLINICAL DATA:  Foot pain secondary to a fall. EXAM: RIGHT FOOT COMPLETE - 3+ VIEW COMPARISON:  Ankle radiographs dated 07/26/2014 FINDINGS: There is no evidence of fracture or dislocation. Slight degenerative changes of the head of first metatarsal. Small calcaneal enthesophytes. IMPRESSION: No acute abnormality. Electronically Signed   By: Francene BoyersJames  Maxwell M.D.   On: 02/05/2015 08:30   Dg Hip Unilat With Pelvis 2-3 Views Right  02/04/2015  CLINICAL DATA:  Fall, generalized right hip pain. History of Parkinson's disease. EXAM: DG HIP (WITH OR WITHOUT PELVIS) 2-3V RIGHT COMPARISON:  Plain film of the pelvis and right hip dated 07/14/2014. FINDINGS: Single view of the pelvis and two views of the right  hip are provided. Osseous alignment is normal. No fracture line or displaced fracture fragment identified. No significant degenerative change at either hip joint. Probable mild degenerative change within the lower lumbar spine. Soft tissues about the pelvis and right hip are unremarkable. Faint calcific density underlying the right inferior pubic ramus suggests a developing chronic calcific tendinopathy. IMPRESSION: No acute findings.  No osseous fracture or dislocation. Electronically Signed   By: Bary Richard M.D.   On: 02/04/2015 14:17     CBC  Recent Labs Lab 02/04/15 1417 02/05/15 0104  WBC 6.5 3.9*  HGB 13.0 11.5*  HCT 41.6 36.1  PLT 283 240  MCV 89.1 88.0  MCH 27.8 28.0  MCHC 31.3 31.9  RDW 13.5 13.5  LYMPHSABS 2.5  --   MONOABS 1.2*  --   EOSABS 0.0  --   BASOSABS 0.0  --     Chemistries   Recent Labs Lab 02/04/15 1322 02/04/15 1604 02/05/15 0104  NA 141  --   --   K 5.6* 4.4  --   CL 105  --   --   CO2 30  --   --   GLUCOSE 98   --   --   BUN 18  --   --   CREATININE 1.05*  --  0.88  CALCIUM 9.2  --   --    ------------------------------------------------------------------------------------------------------------------ estimated creatinine clearance is 41.7 mL/min (by C-G formula based on Cr of 0.88). ------------------------------------------------------------------------------------------------------------------ No results for input(s): HGBA1C in the last 72 hours. ------------------------------------------------------------------------------------------------------------------ No results for input(s): CHOL, HDL, LDLCALC, TRIG, CHOLHDL, LDLDIRECT in the last 72 hours. ------------------------------------------------------------------------------------------------------------------ No results for input(s): TSH, T4TOTAL, T3FREE, THYROIDAB in the last 72 hours.  Invalid input(s): FREET3 ------------------------------------------------------------------------------------------------------------------ No results for input(s): VITAMINB12, FOLATE, FERRITIN, TIBC, IRON, RETICCTPCT in the last 72 hours.  Coagulation profile No results for input(s): INR, PROTIME in the last 168 hours.  No results for input(s): DDIMER in the last 72 hours.  Cardiac Enzymes No results for input(s): CKMB, TROPONINI, MYOGLOBIN in the last 168 hours.  Invalid input(s): CK ------------------------------------------------------------------------------------------------------------------ Invalid input(s): POCBNP   Time Spent in minutes   35   SINGH,PRASHANT K M.D on 02/06/2015 at 10:49 AM  Between 7am to 7pm - Pager - [phone]  After 7pm go to [email or web address] - password [facility]  [facility] -  Office  [phone]

## 2015-02-06 NOTE — Plan of Care (Signed)
Problem: Safety: Goal: Ability to remain free from injury will improve Outcome: Not Met (add Reason) Pt still tries to get OOB occasionally

## 2015-02-06 NOTE — Progress Notes (Signed)
Physical Therapy Treatment Patient Details Name: Susan GasserShirley Simmons MRN: [id number] DOB: 10-31-1934 Today's Date: 02/06/2015    History of Present Illness pt presents with AMS and Encephalopathy.  pt with hx of Parkinson's Disease.    PT Comments    Needing max encouragement to participate in mobility, getting OOB and walking; Continues to be an unrealible historian, but she does tell me that her daughter helps her, lives locally but does not live with her -- of course, this will need to be verified, and at this point, if Susan Simmons does not have 24 hour assist available to her at home, we must seek SNF placement for rehab to maximize independence and safety with mobility;  We also need more info re: prior level of function of function -- not sure how much ambulation she was doing prior to admission given her hamstring tightness;  While Susan Simmons is more able to answer questions, she is still quite disoriented, stating this isn't her room (she seems unaware she is in the hopsital), and refusing to eat her breakfast because it "isn't mine" (hers)  Follow Up Recommendations  SNF      Equipment Recommendations  Rolling walker with 5" wheels;3in1 (PT);Wheelchair (measurements PT);Wheelchair cushion (measurements PT)    Recommendations for Other Services       Precautions / Restrictions Precautions Precautions: Fall    Mobility  Bed Mobility Overal bed mobility: Needs Assistance Bed Mobility: Supine to Sit     Supine to sit: Mod assist     General bed mobility comments: A with bringing LEs OOB and with bringing trunk up to sitting.    Transfers Overall transfer level: Needs assistance Equipment used: Rolling walker (2 wheeled) Transfers: Sit to/from Stand Sit to Stand: Mod assist         General transfer comment: Heavy mod assist and tactile cues including shifting weight over feet to initiate transfer; continued significant posterior lean resulting in need for heavy mod  assist/support to keep weight anteriorly over feet and keep from falling; hand over hand assist for hand placement; cues for hand placement and control of descent with stand to sit  Ambulation/Gait Ambulation/Gait assistance: +2 physical assistance;Mod assist Ambulation Distance (Feet): 5 Feet Assistive device: Rolling walker (2 wheeled) (and second person for chair behind) Gait Pattern/deviations: Festinating     General Gait Details: very small steps with mod assist to keep weight over feet due to enduring significant posterior lean   Stairs            Wheelchair Mobility    Modified Rankin (Stroke Patients Only)       Balance     Sitting balance-Leahy Scale: Poor       Standing balance-Leahy Scale: Zero                      Cognition Arousal/Alertness: Awake/alert Behavior During Therapy: Flat affect Overall Cognitive Status: No family/caregiver present to determine baseline cognitive functioning                      Exercises      General Comments General comments (skin integrity, edema, etc.): Noting hamstring tightness as we elevated feet in recliner chair -- given this tightness, I'm not sure how ambulatory Susan Simmons has been here in the recent past      Pertinent Vitals/Pain Pain Assessment: No/denies pain    Home Living  Prior Function            PT Goals (current goals can now be found in the care plan section) Acute Rehab PT Goals Patient Stated Goal: Pt not stating. PT Goal Formulation: With patient Time For Goal Achievement: 02/19/15 Potential to Achieve Goals: Good Progress towards PT goals: Progressing toward goals (slowly and with much encouragement)    Frequency  Min 3X/week    PT Plan Current plan remains appropriate    Co-evaluation             End of Session Equipment Utilized During Treatment: Gait belt Activity Tolerance: Other (comment) (limtied by disoreintation  leading to decr participation) Patient left: in chair;with call bell/phone within reach;with chair alarm set     Time: 4540-9811 PT Time Calculation (min) (ACUTE ONLY): 20 min  Charges:  $Gait Training: 8-22 mins                    G Codes:      Susan Simmons 02/06/2015, 9:56 AM  Van Clines, PT  Acute Rehabilitation Services Pager [phone] Office [phone]

## 2015-02-06 NOTE — Progress Notes (Signed)
Pt refusing pills and breakfast this am. Pt stated, "Those aren't mine". Nurse reassurred the pt that the meds and food are for her. Pt continues to refuse. Nurse attempted many times with medications. Pt is oriented to self only. Will make dr aware and continue to monitor pt. Pt is sitting in chair this am with chair alarm on. Call bell and phone are within reach.

## 2015-02-06 NOTE — Progress Notes (Signed)
Pt continuously getting oob. Pt very confused, anxious and tearful.Dr Thedore MinsSingh notified. Dr ordered 2mg  Haldol IV Q6H PRN. Will administer as soon as available. Will continue to monitor pt. Bed remains in lowest position ans call bell is within reach.

## 2015-02-07 DIAGNOSIS — G20 Parkinson's disease: Secondary | ICD-10-CM | POA: Diagnosis not present

## 2015-02-07 LAB — URINE CULTURE: Culture: 80000

## 2015-02-07 MED ORDER — CEFPODOXIME PROXETIL 200 MG PO TABS: 200.0000 mg | ORAL_TABLET | Freq: Two times a day (BID) | ORAL | Status: DC

## 2015-02-07 MED ORDER — CEFPODOXIME PROXETIL 200 MG PO TABS
200.0000 mg | ORAL_TABLET | Freq: Two times a day (BID) | ORAL | Status: DC
  Filled 2015-02-07 (×2): qty 1

## 2015-02-07 MED ORDER — CARBIDOPA-LEVODOPA 25-100 MG PO TABS: 1.0000 | ORAL_TABLET | Freq: Three times a day (TID) | ORAL | Status: DC

## 2015-02-07 NOTE — NC FL2 (Signed)
[place] MEDICAID FL2 LEVEL OF CARE SCREENING TOOL     IDENTIFICATION  Patient Name: Susan GasserShirley Simmons Birthdate: July 07, 1934 Sex: female Admission Date (Current Location): 02/04/2015  [facility]   Facility and Address:  [facility] [place], [address], [place], [place] [address]      Provider Number: [id number]  Attending Physician Name and Address:  Leroy SeaPrashant K Singh, MD  Relative Name and Phone Number:  [name], daughter, [phone]    Current Level of Care: Hospital Recommended Level of Care: Skilled Nursing Facility Prior Approval Number:    Date Approved/Denied:   PASRR Number: [id number]  Discharge Plan: SNF    Current Diagnoses: Patient Active Problem List   Diagnosis Date Noted  . UTI (urinary tract infection) 02/05/2015  . Parkinsonism (HCC) 01/25/2015  . Abnormality of gait   . Leukopenia 01/07/2015  . Anxiety 11/12/2014  . Malnutrition of moderate degree 11/10/2014  . Parkinson disease (HCC)   . Pressure ulcer stage II 11/09/2014  . Acute encephalopathy 08/02/2014  . Depression 05/02/2014    Orientation RESPIRATION BLADDER Height & Weight    Self  Normal Continent, Incontinent (Incontinent at times)   110 lbs.  BEHAVIORAL SYMPTOMS/MOOD NEUROLOGICAL BOWEL NUTRITION STATUS   (N/A)   Continent  (Regular diet)  AMBULATORY STATUS COMMUNICATION OF NEEDS Skin   Extensive Assist Verbally Normal                       Personal Care Assistance Level of Assistance  Bathing, Feeding, Dressing Bathing Assistance: Maximum assistance Feeding assistance: Independent Dressing Assistance: Maximum assistance     Functional Limitations Info             SPECIAL CARE FACTORS FREQUENCY  PT (By licensed PT)     PT Frequency: 5x/week              Contractures      Additional Factors Info  Code Status, Allergies, Isolation Precautions Code Status Info: Full Allergies Info: NKA     Isolation  Precautions Info: MRSA     Current Medications (02/07/2015):  This is the current hospital active medication list Current Facility-Administered Medications  Medication Dose Route Frequency Provider Last Rate Last Dose  . acetaminophen (TYLENOL) tablet 650 mg  650 mg Oral Q6H PRN Leroy SeaPrashant K Singh, MD      . busPIRone (BUSPAR) tablet 15 mg  15 mg Oral BID Rhetta MuraJai-Gurmukh Samtani, MD   15 mg at 02/06/15 2126  . carbidopa-levodopa (SINEMET IR) 25-100 MG per tablet immediate release 1 tablet  1 tablet Oral TID Rhetta MuraJai-Gurmukh Samtani, MD   1 tablet at 02/06/15 2126  . cefpodoxime (VANTIN) tablet 200 mg  200 mg Oral Q12H Leroy SeaPrashant K Singh, MD      . enoxaparin (LOVENOX) injection 40 mg  40 mg Subcutaneous QHS Rhetta MuraJai-Gurmukh Samtani, MD   40 mg at 02/06/15 2126  . feeding supplement (ENSURE ENLIVE) (ENSURE ENLIVE) liquid 237 mL  237 mL Oral BID BM Leroy SeaPrashant K Singh, MD   237 mL at 02/05/15 1130  . haloperidol lactate (HALDOL) injection 2 mg  2 mg Intravenous Q6H PRN Leroy SeaPrashant K Singh, MD   2 mg at 02/06/15 1716  . mirtazapine (REMERON) tablet 15 mg  15 mg Oral QHS Rhetta MuraJai-Gurmukh Samtani, MD   15 mg at 02/06/15 2126     Discharge Medications: Please see discharge summary for a list of discharge medications.  Relevant Imaging Results:  Relevant Lab Results:  Additional Information SSN: [id number]  Mearl Latin, LCSWA

## 2015-02-07 NOTE — Discharge Instructions (Signed)
Follow with Primary MD Clayborn HeronVictoria R Rankins, MD in 7 days   Get CBC, CMP, 2 view Chest X ray checked  by Primary MD next visit.    Activity: As tolerated with Full fall precautions use walker/cane & assistance as needed   Disposition SNF   Diet:   Heart Healthy   For Heart failure patients - Check your Weight same time everyday, if you gain over 2 pounds, or you develop in leg swelling, experience more shortness of breath or chest pain, call your Primary MD immediately. Follow Cardiac Low Salt Diet and 1.5 lit/day fluid restriction.   On your next visit with your primary care physician please Get Medicines reviewed and adjusted.   Please request your Prim.MD to go over all Hospital Tests and Procedure/Radiological results at the follow up, please get all Hospital records sent to your Prim MD by signing hospital release before you go home.   If you experience worsening of your admission symptoms, develop shortness of breath, life threatening emergency, suicidal or homicidal thoughts you must seek medical attention immediately by calling 911 or calling your MD immediately  if symptoms less severe.  You Must read complete instructions/literature along with all the possible adverse reactions/side effects for all the Medicines you take and that have been prescribed to you. Take any new Medicines after you have completely understood and accpet all the possible adverse reactions/side effects.   Do not drive, operating heavy machinery, perform activities at heights, swimming or participation in water activities or provide baby sitting services if your were admitted for syncope or siezures until you have seen by Primary MD or a Neurologist and advised to do so again.  Do not drive when taking Pain medications.    Do not take more than prescribed Pain, Sleep and Anxiety Medications  Special Instructions: If you have smoked or chewed Tobacco  in the last 2 yrs please stop smoking, stop any  regular Alcohol  and or any Recreational drug use.  Wear Seat belts while driving.   Please note  You were cared for by a hospitalist during your hospital stay. If you have any questions about your discharge medications or the care you received while you were in the hospital after you are discharged, you can call the unit and asked to speak with the hospitalist on call if the hospitalist that took care of you is not available. Once you are discharged, your primary care physician will handle any further medical issues. Please note that NO REFILLS for any discharge medications will be authorized once you are discharged, as it is imperative that you return to your primary care physician (or establish a relationship with a primary care physician if you do not have one) for your aftercare needs so that they can reassess your need for medications and monitor your lab values.

## 2015-02-07 NOTE — Progress Notes (Signed)
[facility] Anticipated DC date: 02/07/15 Family notified: Daughter, Rinaldo Cloudamela Transport by: PTAR 3:30pm  CSW signing off.  Cristobal GoldmannNadia Mckale Haffey, [place] Clinical Social Worker [phone]

## 2015-02-07 NOTE — Evaluation (Signed)
Occupational Therapy Evaluation Patient Details Name: Susan Simmons MRN: [id number] DOB: 1934/05/24 Today's Date: 02/07/2015    History of Present Illness pt presents with AMS and Encephalopathy.  pt with hx of Parkinson's Disease.   Clinical Impression   Plan is for patient to discharge to SNF today. No acute OT needs identified, all needs can be met in SNF. Please send text page to OT services if any questions, concerns, or with new orders: [phone] OR call office at [phone]. Thank you for the order.      Follow Up Recommendations  SNF;Supervision/Assistance - 24 hour    Equipment Recommendations  Other (comment) (TBD prn)    Recommendations for Other Services  None at this time   Precautions / Restrictions Precautions Precautions: Fall Restrictions Weight Bearing Restrictions: No    Mobility Bed Mobility Overal bed mobility: Needs Assistance Bed Mobility: Supine to Sit     Supine to sit: Mod assist     General bed mobility comments: A with bringing LEs OOB and with bringing trunk up to sitting.  Bed pad used to pull hips forward on bed.   Transfers Overall transfer level: Needs assistance Equipment used: None Transfers: Sit to/from [facility] Sit to Stand: Mod assist;+2 safety/equipment Stand pivot transfers: Mod assist;+2 safety/equipment       General transfer comment: Therapist standing in front of patient, pt with significant posterior lean in standing and during stand pivot transfer. Pt required cues for safety, technique, and seqencing to lean forward during transfers.     Balance Overall balance assessment: Needs assistance;History of Falls Sitting-balance support: No upper extremity supported;Feet supported Sitting balance-Leahy Scale: Fair   Postural control: Posterior lean Standing balance support: No upper extremity supported;During functional activity Standing balance-Leahy Scale: Poor (poor to zero)    ADL  Overall ADL's : Needs assistance/impaired Eating/Feeding: Bed level;Set up   Grooming: Supervision/safety;Set up;Sitting;Cueing for sequencing;Cueing for safety   Upper Body Bathing: Minimal assitance;Sitting   Lower Body Bathing: Moderate assistance;Sit to/from stand   Upper Body Dressing : Minimal assistance;Sitting   Lower Body Dressing: Maximal assistance;Sit to/from stand   Toilet Transfer: Moderate assistance;Cueing for safety;BSC;Stand-pivot;+2 for safety/equipment   Toileting- Clothing Manipulation and Hygiene: Total assistance;Sit to/from stand;+2 for safety/equipment       Functional mobility during ADLs: Moderate assistance;Cueing for safety;Cueing for sequencing General ADL Comments: Pt sat EOB for grooming tasks. Pt did request to use BR. Therapist assisted patient EOB to [facility] with mod assist +2 for safety.     Pertinent Vitals/Pain Pain Assessment: No/denies pain     Hand Dominance Left (pt unable to state when asked, but pt using LUE functionally)   Extremity/Trunk Assessment Upper Extremity Assessment Upper Extremity Assessment: Generalized weakness   Lower Extremity Assessment Lower Extremity Assessment: Defer to PT evaluation   Cervical / Trunk Assessment Cervical / Trunk Assessment: Kyphotic   Communication Communication Communication: No difficulties   Cognition Arousal/Alertness: Awake/alert Behavior During Therapy: Flat affect Overall Cognitive Status: No family/caregiver present to determine baseline cognitive functioning              Home Living Family/patient expects to be discharged to:: Skilled nursing facility Additional Comments: No family present and pt not reliable historian.           OT Diagnosis: Generalized weakness;Altered mental status   OT Problem List:   N/a, no acute OT needs identified at this time. Patient plans to d/c back to [facility] today.    OT Treatment/Interventions:  N/a, no acute OT needs identified at  this time. Patient plans to d/c back to [facility] today.    OT Goals(Current goals can be found in the care plan section) Acute Rehab OT Goals Patient Stated Goal: none stated OT Goal Formulation: Patient unable to participate in goal setting  OT Frequency:   N/a, no acute OT needs identified at this time. Patient plans to d/c back to [facility] today.    Barriers to D/C:  None none at this time    End of Session Equipment Utilized During Treatment: Gait belt  Activity Tolerance: Patient tolerated treatment well Patient left: in bed;with call bell/phone within reach;with bed alarm set   Time: 1155-1210 OT Time Calculation (min): 15 min Charges:  OT General Charges $OT Visit: 1 Procedure OT Evaluation $Initial OT Evaluation Tier I: 1 Procedure G-Codes: OT G-codes **NOT FOR INPATIENT CLASS** Functional Limitation: Self care Self Care Current Status (E6754): At least 40 percent but less than 60 percent impaired, limited or restricted Self Care Goal Status (G9201): At least 40 percent but less than 60 percent impaired, limited or restricted Self Care Discharge Status [phone]): At least 40 percent but less than 60 percent impaired, limited or restricted  MOD tier evaluation  Chrys Racer , MS, OTR/L, CLT Pager: [phone]  02/07/2015, 1:16 PM

## 2015-02-07 NOTE — Discharge Summary (Signed)
Susan GasserShirley Simmons, is a 81 y.o. female  DOB [birth year]  MRN [id number].  Admission date:  02/04/2015  Admitting Physician  Rhetta MuraJai-Gurmukh Samtani, MD  Discharge Date:  02/07/2015   Primary MD  Clayborn HeronVictoria R Rankins, MD  Recommendations for primary care physician for things to follow:   Check CBC, BMP in 1 week.  Note CT scan of the C-spine, she has multinodular goiter, age appropriate outpatient workup by PCP.    Admission Diagnosis  Weakness [R53.1] Acute cystitis without hematuria [N30.00]   Discharge Diagnosis  Weakness [R53.1] Acute cystitis without hematuria [N30.00]    Active Problems:   Acute encephalopathy   Parkinson disease (HCC)   UTI (urinary tract infection)      Past Medical History  Diagnosis Date  . Anxiety   . Depression   . Parkinson disease (HCC)   . Heart murmur     Past Surgical History  Procedure Laterality Date  . Denies surgical history         HPI  from the history and physical done on the day of admission:     81 y/o ? Known Parkinson's disease managed by Dr. Lucia GaskinsAhern neurology Bipolar disease Pressure ulcer stage II Moderate malnutrition she was admitted 01/06/15 with change in mentation and a workup was done for CVA and other things It was felt that this was primarily secondary to Parkinson's and was recommended at that time to continue Sinemet 1 tab 3 times a day 7 AM 12 PM 5 PM and one Sinemet CR 25/100 daily at bedtime and also amantadine was added twice a day Based on report from neurologist 12/20 patient was noncompliant with these recommendations and only taking her Sinemet IR twice daily 8:30 and 5 PM She was discharged back home   it is noted that she was admitted with similar findings 11/11/14 She also has been admitted 08/04/14 for encephalopathy  In the emergency  room found to have normal white count, normal hemoglobin, normal platelet count Potassium 5.6 BUN/creatinine 18/1.0 over baseline 16/0.8 Large hemoglobin based on cath specimen moderate leukocytes no nitrites CT cervical spine CT head showed no evidence of fracture degenerative changes in the spine and heterogeneous enlargement of thyroid gland presumed multinodular goiter Hip x-ray showed no specific findings of fracture    Hospital Course:   1. UTI and Parkinson medication induced encephalopathy. In a patient with underlying Parkinson's with dementia, now appears close to baseline, Parkinson's medications have been titrated by the neurologist. PT eval, likely require placement. Patient now is likely at her baseline, per neurologist who saw the patient upon admission she has remarkably improved on 02/06/2015.   2. UTI. Receive Ro[facility]phin and [facility] and stop date 02/09/2015, clinically stable. Urine culture inconclusive.  3. History of fall. No aches or pains, head CT, neck CT, right hip and pelvis x-ray, right foot x-ray. All nonacute.  4. Incidental finding of multinodular with her on neck CT. Outpatient age-appropriate workup by PCP.  5. Moderate degenerative malnutrition. Protein supplements restarted.  Discharge Condition: Fair  Follow UP  Follow-up Information    Follow up with Clayborn Heron, MD. Schedule an appointment as soon as possible for a visit in 1 week.   Specialty:  Family Medicine   Contact information:   [address] [place] [address] [phone]        Consults obtained - None  Diet and Activity recommendation: See Discharge Instructions below  Discharge Instructions           Discharge Instructions    Discharge instructions    Complete by:  As directed   Follow with Primary MD Clayborn Heron, MD in 7 days   Get CBC, CMP, 2 view Chest X ray checked  by Primary MD next visit.    Activity: As tolerated  with Full fall precautions use walker/cane & assistance as needed   Disposition SNF   Diet:   Heart Healthy   For Heart failure patients - Check your Weight same time everyday, if you gain over 2 pounds, or you develop in leg swelling, experience more shortness of breath or chest pain, call your Primary MD immediately. Follow Cardiac Low Salt Diet and 1.5 lit/day fluid restriction.   On your next visit with your primary care physician please Get Medicines reviewed and adjusted.   Please request your Prim.MD to go over all Hospital Tests and Procedure/Radiological results at the follow up, please get all Hospital records sent to your Prim MD by signing hospital release before you go home.   If you experience worsening of your admission symptoms, develop shortness of breath, life threatening emergency, suicidal or homicidal thoughts you must seek medical attention immediately by calling 911 or calling your MD immediately  if symptoms less severe.  You Must read complete instructions/literature along with all the possible adverse reactions/side effects for all the Medicines you take and that have been prescribed to you. Take any new Medicines after you have completely understood and accpet all the possible adverse reactions/side effects.   Do not drive, operating heavy machinery, perform activities at heights, swimming or participation in water activities or provide baby sitting services if your were admitted for syncope or siezures until you have seen by Primary MD or a Neurologist and advised to do so again.  Do not drive when taking Pain medications.    Do not take more than prescribed Pain, Sleep and Anxiety Medications  Special Instructions: If you have smoked or chewed Tobacco  in the last 2 yrs please stop smoking, stop any regular Alcohol  and or any Recreational drug use.  Wear Seat belts while driving.   Please note  You were cared for by a hospitalist during your hospital  stay. If you have any questions about your discharge medications or the care you received while you were in the hospital after you are discharged, you can call the unit and asked to speak with the hospitalist on call if the hospitalist that took care of you is not available. Once you are discharged, your primary care physician will handle any further medical issues. Please note that NO REFILLS for any discharge medications will be authorized once you are discharged, as it is imperative that you return to your primary care physician (or establish a relationship with a primary care physician if you do not have one) for your aftercare needs so that they can reassess your need for medications and monitor your lab values.     Increase activity slowly  Complete by:  As directed              Discharge Medications       Medication List    STOP taking these medications        DM-Doxylamine-Acetaminophen 15-6.25-325 MG/15ML Liqd      TAKE these medications        busPIRone 15 MG tablet  Commonly known as:  BUSPAR  Take 15 mg by mouth 2 (two) times daily.     carbidopa-levodopa 25-100 MG tablet  Commonly known as:  SINEMET IR  Take 1 tablet by mouth 3 (three) times daily.     cefpodoxime 200 MG tablet  Commonly known as:  VANTIN  Take 1 tablet (200 mg total) by mouth every 12 (twelve) hours. Stop on 02/09/2015     EMERGEN-C IMMUNE PLUS PO  Take by mouth as directed.     feeding supplement (ENSURE ENLIVE) Liqd  Take 237 mLs by mouth 2 (two) times daily between meals.     mirtazapine 15 MG tablet  Commonly known as:  REMERON  Take 15 mg by mouth at bedtime.        Major procedures and Radiology Reports - PLEASE review detailed and final reports for all details, in brief -       Ct Head Wo Contrast  02/04/2015  CLINICAL DATA:  Fall, denies loss of consciousness. Hit head and right side of face. EXAM: CT HEAD WITHOUT CONTRAST CT CERVICAL SPINE WITHOUT CONTRAST TECHNIQUE:  Multidetector CT imaging of the head and cervical spine was performed following the standard protocol without intravenous contrast. Multiplanar CT image reconstructions of the cervical spine were also generated. COMPARISON:  Brain MRI dated 01/06/2015 and head CT dated 01/06/2015. FINDINGS: CT HEAD FINDINGS Again noted is mild generalized brain atrophy with commensurate dilatation of the ventricles and sulci. Mild chronic small vessel ischemic change again noted within the deep periventricular white matter regions bilaterally. There is no mass, hemorrhage, edema or other evidence of acute parenchymal abnormality. No extra-axial hemorrhage. No osseous fracture or dislocation seen. Superficial soft tissues are unremarkable. CT CERVICAL SPINE FINDINGS Degenerative changes are seen throughout the cervical spine, at least moderate in degree with associated disc space narrowings and osseous spurring. Disc-osteophytic bulges at each level but no more than mild central canal stenosis at any level. Mild levoscoliosis and mildly accentuated lordosis likely related to the underlying degenerative changes. No fracture line or displaced fracture fragment identified. Facets are well aligned throughout. There is heterogeneous enlargement of the bilateral thyroid lobes, right greater than left. Paravertebral soft tissues are otherwise unremarkable. Scarring/fibrosis noted at each lung apex. IMPRESSION: 1. No evidence of acute intracranial abnormality. No intracranial hemorrhage or edema. No skull fracture. Mild atrophy and chronic ischemic changes, as detailed above. 2. Degenerative changes of the cervical spine, as detailed above. No fracture or acute subluxation identified within the cervical spine. 3. Heterogeneous enlargement of the thyroid gland, presumed multinodular goiter. Would consider nonemergent follow-up with thyroid ultrasound and/or nuclear medicine thyroid scan at some point to ensure benignity. Electronically Signed    By: Bary Richard M.D.   On: 02/04/2015 14:11   Ct Cervical Spine Wo Contrast  02/04/2015  CLINICAL DATA:  Fall, denies loss of consciousness. Hit head and right side of face. EXAM: CT HEAD WITHOUT CONTRAST CT CERVICAL SPINE WITHOUT CONTRAST TECHNIQUE: Multidetector CT imaging of the head and cervical spine was performed following the standard protocol without intravenous contrast. Multiplanar CT image reconstructions of the  cervical spine were also generated. COMPARISON:  Brain MRI dated 01/06/2015 and head CT dated 01/06/2015. FINDINGS: CT HEAD FINDINGS Again noted is mild generalized brain atrophy with commensurate dilatation of the ventricles and sulci. Mild chronic small vessel ischemic change again noted within the deep periventricular white matter regions bilaterally. There is no mass, hemorrhage, edema or other evidence of acute parenchymal abnormality. No extra-axial hemorrhage. No osseous fracture or dislocation seen. Superficial soft tissues are unremarkable. CT CERVICAL SPINE FINDINGS Degenerative changes are seen throughout the cervical spine, at least moderate in degree with associated disc space narrowings and osseous spurring. Disc-osteophytic bulges at each level but no more than mild central canal stenosis at any level. Mild levoscoliosis and mildly accentuated lordosis likely related to the underlying degenerative changes. No fracture line or displaced fracture fragment identified. Facets are well aligned throughout. There is heterogeneous enlargement of the bilateral thyroid lobes, right greater than left. Paravertebral soft tissues are otherwise unremarkable. Scarring/fibrosis noted at each lung apex. IMPRESSION: 1. No evidence of acute intracranial abnormality. No intracranial hemorrhage or edema. No skull fracture. Mild atrophy and chronic ischemic changes, as detailed above. 2. Degenerative changes of the cervical spine, as detailed above. No fracture or acute subluxation identified  within the cervical spine. 3. Heterogeneous enlargement of the thyroid gland, presumed multinodular goiter. Would consider nonemergent follow-up with thyroid ultrasound and/or nuclear medicine thyroid scan at some point to ensure benignity. Electronically Signed   By: Bary Richard M.D.   On: 02/04/2015 14:11   Dg Foot Complete Right  02/05/2015  CLINICAL DATA:  Foot pain secondary to a fall. EXAM: RIGHT FOOT COMPLETE - 3+ VIEW COMPARISON:  Ankle radiographs dated 07/26/2014 FINDINGS: There is no evidence of fracture or dislocation. Slight degenerative changes of the head of first metatarsal. Small calcaneal enthesophytes. IMPRESSION: No acute abnormality. Electronically Signed   By: Francene Boyers M.D.   On: 02/05/2015 08:30   Dg Hip Unilat With Pelvis 2-3 Views Right  02/04/2015  CLINICAL DATA:  Fall, generalized right hip pain. History of Parkinson's disease. EXAM: DG HIP (WITH OR WITHOUT PELVIS) 2-3V RIGHT COMPARISON:  Plain film of the pelvis and right hip dated 07/14/2014. FINDINGS: Single view of the pelvis and two views of the right hip are provided. Osseous alignment is normal. No fracture line or displaced fracture fragment identified. No significant degenerative change at either hip joint. Probable mild degenerative change within the lower lumbar spine. Soft tissues about the pelvis and right hip are unremarkable. Faint calcific density underlying the right inferior pubic ramus suggests a developing chronic calcific tendinopathy. IMPRESSION: No acute findings.  No osseous fracture or dislocation. Electronically Signed   By: Bary Richard M.D.   On: 02/04/2015 14:17    Micro Results      Recent Results (from the past 240 hour(s))  Urine culture     Status: None (Preliminary result)   Collection Time: 02/04/15  1:35 PM  Result Value Ref Range Status   Specimen Description URINE, CATHETERIZED  Final   Special Requests NONE  Final   Culture   Final    80,000 COLONIES/ml GRAM NEGATIVE  RODS Performed at [facility]    Report Status PENDING  Incomplete       Today   Subjective    Susan Simmons today has no headache,no chest abdominal pain,no new weakness tingling or numbness, feels much better.    Objective   Blood pressure 130/52, pulse 77, temperature 98.2 F (36.8 C), temperature source  Oral, resp. rate 18, weight 50.077 kg (110 lb 6.4 oz), SpO2 100 %.   Intake/Output Summary (Last 24 hours) at 02/07/15 0948 Last data filed at 02/07/15 0151  Gross per 24 hour  Intake    280 ml  Output   1000 ml  Net   -720 ml    Exam Awake , pleasantly confused oriented 1, No new F.N deficits, Normal affect Susan Simmons,PERRAL Supple Neck,No JVD, No cervical lymphadenopathy appriciated.  Symmetrical Chest wall movement, Good air movement bilaterally, CTAB RRR,No Gallops,Rubs or new Murmurs, No Parasternal Heave +ve B.Sounds, Abd Soft, Non tender, No organomegaly appriciated, No rebound -guarding or rigidity. No Cyanosis, Clubbing or edema, No new Rash or bruise   Data Review   CBC w Diff:  Lab Results  Component Value Date   WBC 3.9* 02/05/2015   HGB 11.5* 02/05/2015   HCT 36.1 02/05/2015   PLT 240 02/05/2015   LYMPHOPCT 39 02/04/2015   MONOPCT 18 02/04/2015   EOSPCT 1 02/04/2015   BASOPCT 1 02/04/2015    CMP:  Lab Results  Component Value Date   NA 141 02/04/2015   K 4.4 02/04/2015   CL 105 02/04/2015   CO2 30 02/04/2015   BUN 18 02/04/2015   CREATININE 0.88 02/05/2015   PROT 6.7 01/06/2015   ALBUMIN 3.1* 01/06/2015   BILITOT 0.5 01/06/2015   ALKPHOS 71 01/06/2015   AST 25 01/06/2015   ALT 12* 01/06/2015  .   Total Time in preparing paper work, data evaluation and todays exam - 35 minutes  Leroy Sea M.D on 02/07/2015 at 9:48 AM  [facility]   Office  [phone]

## 2015-02-07 NOTE — Clinical Social Work Placement (Signed)
   CLINICAL SOCIAL WORK PLACEMENT  NOTE  Date:  02/07/2015  Patient Details  Name: Susan Simmons MRN: [id number] Date of Birth: May 17, 1934  Clinical Social Work is seeking post-discharge placement for this patient at the Skilled  Nursing Facility level of care (*CSW will initial, date and re-position this form in  chart as items are completed):  Yes   Patient/family provided with [facility] Clinical Social Work Department's list of facilities offering this level of care within the geographic area requested by the patient (or if unable, by the patient's family).  Yes   Patient/family informed of their freedom to choose among providers that offer the needed level of care, that participate in Medicare, Medicaid or managed care program needed by the patient, have an available bed and are willing to accept the patient.  Yes   Patient/family informed of [facility]'s ownership interest in [facility] and [facility], as well as of the fact that they are under no obligation to receive care at these facilities.  PASRR submitted to EDS on       PASRR number received on       Existing PASRR number confirmed on 02/07/15     FL2 transmitted to all facilities in geographic area requested by pt/family on 02/07/15     FL2 transmitted to all facilities within larger geographic area on       Patient informed that his/her managed care company has contracts with or will negotiate with certain facilities, including the following:        Yes   Patient/family informed of bed offers received.  Patient chooses bed at [facility]     Physician recommends and patient chooses bed at      [facility] to [facility] on 02/07/15.  Patient to be transferred to facility by [facility]     Patient family notified on 02/07/15 of transfer.  Name of family member notified:  Daughter     PHYSICIAN Please prepare prescriptions     Additional Comment:     _______________________________________________ Mearl LatinNadia S Kayon Dozier, LCSWA 02/07/2015, 2:26 PM

## 2015-02-07 NOTE — Clinical Social Work Note (Signed)
Clinical Social Work Assessment  Patient Details  Name: Susan GasserShirley Franchino MRN: [id number] Date of Birth: 07/01/1934  Date of referral:  02/07/15               Reason for consult:  Facility Placement                Permission sought to share information with:  Facility Medical sales representativeContact Representative, Family Supports Permission granted to share information::  No (Patient disoriented so CSW completed assessment w/ daughter, [name].)  Name::     [name]  [facility]  Relationship::  Daughter  Contact Information:  [phone]  Housing/Transportation Living arrangements for the past 2 months:  Single Family Home Source of Information:  Adult Children Patient Interpreter Needed:  None Criminal Activity/Legal Involvement Pertinent to Current Situation/Hospitalization:  No - Comment as needed Significant Relationships:  Adult Children Lives with:  Self Do you feel safe going back to the place where you live?  No Need for family participation in patient care:  Yes (Comment)  Care giving concerns:  CSW received referral for possible SNF placement at time of discharge. CSW spoke w/ patient's daughter, [name], regarding PT recommendation of SNF placement at time of discharge. Per patient's daughter, patient is currently unable to care for herself at home given patient's current physical needs and fall risk. Patient's daughter expressed understanding of PT recommendation and are agreeable to SNF placement at time of discharge. CSW to continue to follow and assist with discharge planning needs.   Social Worker assessment / plan:  CSW spoke with patient's daughter concerning [facility] before [facility].  Employment status:  Retired Health and safety inspectornsurance information:    PT Recommendations:  Skilled Teacher, early years/preursing Facility Information / Referral to community resources:  Skilled Nursing Facility  Patient/Family's Response to care:  Patient's daughter recognizes need for rehab before returning  home and [place]. Patient's daughter reported preference for a SNF that will encourage her mother to participate in PT. Patient has been to [facility] before, but daughter did not like it there.  Patient/Family's Understanding of and Emotional Response to Diagnosis, Current Treatment, and Prognosis:  Patient's daughter is realistic regarding therapy needs. No questions/concerns about plan or treatment.    Emotional Assessment Appearance:  Other (Comment Required (Unable to assess; patient disoriented) Attitude/Demeanor/Rapport:  Unable to Assess ( patient disoriented) Affect (typically observed):  Unable to Assess ( patient disoriented) Orientation:  Oriented to Self Alcohol / Substance use:  Not Applicable Psych involvement (Current and /or in the community):  No (Comment)  Discharge Needs  Concerns to be addressed:  Care Coordination Readmission within the last 30 days:    Current discharge risk:  None Barriers to Discharge:  No Barriers Identified   Mearl Latinadia S Johnisha Louks, LCSWA 02/07/2015, 10:42 AM

## 2015-02-07 NOTE — Care Management Note (Signed)
Case Management Note  Patient Details  Name: Susan GasserShirley Simmons MRN: [id number] Date of Birth: [birth year]  Subjective/Objective:                 Patient admitted with parkinson's disease.  Action/Plan:  Discharge to SNF planned today.  Expected Discharge Date:                  Expected Discharge Plan:  Skilled Nursing Facility  In-House Referral:  Clinical Social Work  Discharge planning Services     Post Acute Care Choice:    Choice offered to:     DME Arranged:    DME Agency:     HH Arranged:    HH Agency:     Status of Service:  Completed, signed off  Medicare Important Message Given:    Date Medicare IM Given:    Medicare IM give by:    Date Additional Medicare IM Given:    Additional Medicare Important Message give by:     If discussed at Long Length of Stay Meetings, dates discussed:    Additional Comments:  Lawerance SabalDebbie Esmirna Ravan, RN 02/07/2015, 10:58 AM

## 2015-02-07 NOTE — Progress Notes (Signed)
Nsg Discharge Note  Admit Date:  02/04/2015 Discharge date: 02/07/2015   Susan Simmons to be D/C'd Rehab per MD order.  AVS completed.  Copy for chart, and copy for patient signed, and dated. Patient/caregiver able to verbalize understanding.  Discharge Medication:   Medication List    STOP taking these medications        DM-Doxylamine-Acetaminophen 15-6.25-325 MG/15ML Liqd      TAKE these medications        busPIRone 15 MG tablet  Commonly known as:  BUSPAR  Take 15 mg by mouth 2 (two) times daily.     carbidopa-levodopa 25-100 MG tablet  Commonly known as:  SINEMET IR  Take 1 tablet by mouth 3 (three) times daily.     cefpodoxime 200 MG tablet  Commonly known as:  VANTIN  Take 1 tablet (200 mg total) by mouth every 12 (twelve) hours. Stop on 02/09/2015     EMERGEN-C IMMUNE PLUS PO  Take by mouth as directed.     feeding supplement (ENSURE ENLIVE) Liqd  Take 237 mLs by mouth 2 (two) times daily between meals.     mirtazapine 15 MG tablet  Commonly known as:  REMERON  Take 15 mg by mouth at bedtime.        Discharge Assessment: Filed Vitals:   02/07/15 0602 02/07/15 1334  BP: 130/52 114/43  Pulse: 77 79  Temp: 98.2 F (36.8 C) 97.9 F (36.6 C)  Resp: 18 24  Skin clean, dry and intact without evidence of skin break down, no evidence of skin tears noted. IV catheter discontinued intact. Site without signs and symptoms of complications - no redness or edema noted at insertion site, patient denies c/o pain - only slight tenderness at site.  Dressing with slight pressure applied.  D/c Instructions-Education: Discharge instructions given to patient/family with verbalized understanding. D/c education completed with patient/family including follow up instructions, medication list, d/c activities limitations if indicated, with other d/c instructions as indicated by MD - patient able to verbalize understanding, all questions fully answered. Patient instructed to  return to ED, call 911, or call MD for any changes in condition.  [facility]. Report called to unit nurse.   Camillo FlamingVicki L Zoe Creasman, RN 02/07/2015 4:33 PM

## 2015-02-08 ENCOUNTER — Non-Acute Institutional Stay (SKILLED_NURSING_FACILITY): Payer: Medicare Other | Admitting: Internal Medicine

## 2015-02-08 DIAGNOSIS — R5381 Other malaise: Secondary | ICD-10-CM

## 2015-02-08 DIAGNOSIS — B964 Proteus (mirabilis) (morganii) as the cause of diseases classified elsewhere: Secondary | ICD-10-CM | POA: Diagnosis not present

## 2015-02-08 DIAGNOSIS — F329 Major depressive disorder, single episode, unspecified: Secondary | ICD-10-CM

## 2015-02-08 DIAGNOSIS — G9340 Encephalopathy, unspecified: Secondary | ICD-10-CM | POA: Diagnosis not present

## 2015-02-08 DIAGNOSIS — E46 Unspecified protein-calorie malnutrition: Secondary | ICD-10-CM | POA: Diagnosis not present

## 2015-02-08 DIAGNOSIS — N390 Urinary tract infection, site not specified: Secondary | ICD-10-CM

## 2015-02-08 DIAGNOSIS — G20 Parkinson's disease: Secondary | ICD-10-CM | POA: Diagnosis not present

## 2015-02-08 NOTE — Progress Notes (Signed)
Patient ID: Susan Simmons, female   DOB: Oct 18, 1934, 81 y.o.   MRN: [id number]     Facility: [facility]    PCP: Clayborn Heron, MD  Code Status: full code  No Known Allergies  Chief Complaint  Patient presents with  . New Admit To SNF     HPI:  81 y.o. patient is here for short term rehabilitation post hospital admission from 02/04/15-02/07/15 with generalized weakness and acute encephalopathy from acute cystitis and parkinson medication induced encephalopathy. She was started on antibiotics and her parkinson's medication was titrated by neurology. She is seen in her room today. She is pleasantly confused. She denies any concerns. No new concern from nursing staff.    Review of Systems:  Constitutional: Negative for fever, chills, diaphoresis.  HENT: Negative for headache, congestion, nasal discharge. Eyes: Negative for blurred vision, double vision and discharge.  Respiratory: Negative for cough, shortness of breath and wheezing.   Cardiovascular: Negative for chest pain, palpitations, leg swelling.  Gastrointestinal: Negative for heartburn, nausea, vomiting, abdominal pain. Has poor appetite Genitourinary: Negative for dysuria and flank pain.  Musculoskeletal: Negative for back pain, falls Skin: Negative for itching, rash.  Neurological: Negative for dizziness Psychiatric/Behavioral: Negative for depression   Past Medical History  Diagnosis Date  . Anxiety   . Depression   . Parkinson disease (HCC)   . Heart murmur    Past Surgical History  Procedure Laterality Date  . Denies surgical history     Social History:   reports that she quit smoking about 18 years ago. She does not have any smokeless tobacco history on file. She reports that she does not drink alcohol or use illicit drugs.  Family History  Problem Relation Age of Onset  . Dementia Neg Hx   . Parkinsonism Neg Hx     Medications:   Medication List       This list is  accurate as of: 02/08/15 10:27 AM.  Always use your most recent med list.               busPIRone 15 MG tablet  Commonly known as:  BUSPAR  Take 15 mg by mouth 2 (two) times daily.     carbidopa-levodopa 25-100 MG tablet  Commonly known as:  SINEMET IR  Take 1 tablet by mouth 3 (three) times daily.     cefpodoxime 200 MG tablet  Commonly known as:  VANTIN  Take 1 tablet (200 mg total) by mouth every 12 (twelve) hours. Stop on 02/09/2015     EMERGEN-C IMMUNE PLUS PO  Take by mouth as directed.     feeding supplement (ENSURE ENLIVE) Liqd  Take 237 mLs by mouth 2 (two) times daily between meals.     mirtazapine 15 MG tablet  Commonly known as:  REMERON  Take 15 mg by mouth at bedtime.         Physical Exam: Filed Vitals:   02/08/15 1026  BP: 132/75  Pulse: 71  Temp: 97.6 F (36.4 C)  Resp: 19  SpO2: 98%    General- elderly female, thin built, in no acute distress Head- normocephalic, atraumatic Nose- normal nasal mucosa, no maxillary or frontal sinus tenderness, no nasal discharge Throat- moist mucus membrane  Eyes- no pallor, no icterus, no discharge, normal conjunctiva, normal sclera Neck- no cervical lymphadenopathy Cardiovascular- normal s1,s2, no murmurs, palpable dorsalis pedis and radial pulses, no leg edema Respiratory- bilateral clear to auscultation, no wheeze, no rhonchi, no crackles, no  use of accessory muscles Abdomen- bowel sounds present, soft, non tender Musculoskeletal- able to move all 4 extremities, generalized weakness, on wheelchair Neurological- no focal deficit, alert and oriented to person only Skin- warm and dry   Labs reviewed: Basic Metabolic Panel:  Recent Labs  09/81/1908/07/21 0502 01/06/15 1753 01/06/15 1804 01/06/15 2146 02/04/15 1322 02/04/15 1604 02/05/15 0104  NA 140 141 138  --  141  --   --   K 3.7 4.2 4.1  --  5.6* 4.4  --   CL 110 103 105  --  105  --   --   CO2 26  --  25  --  30  --   --   GLUCOSE 90 85 89  --  98   --   --   BUN 9 16 16   --  18  --   --   CREATININE 0.66 0.80 0.85 0.79 1.05*  --  0.88  CALCIUM 8.6*  --  9.1  --  9.2  --   --    Liver Function Tests:  Recent Labs  11/08/14 2108 11/09/14 0710 01/06/15 1804  AST 42* 36 25  ALT 16 14 12*  ALKPHOS 58 50 71  BILITOT 0.3 0.4 0.5  PROT 6.6 5.9* 6.7  ALBUMIN 2.8* 2.5* 3.1*    Recent Labs  07/14/14 1146  LIPASE 18*    Recent Labs  11/09/14 0056 01/06/15 2001  AMMONIA 11 22   CBC:  Recent Labs  11/09/14 0710  01/06/15 1804 01/06/15 2146 02/04/15 1417 02/05/15 0104  WBC 5.1  < > 3.6* 4.4 6.5 3.9*  NEUTROABS 2.2  --  0.9*  --  2.7  --   HGB 11.7*  < > 12.2 11.7* 13.0 11.5*  HCT 36.7  < > 38.9 35.4* 41.6 36.1  MCV 88.2  < > 89.0 86.3 89.1 88.0  PLT 255  < > 325 295 283 240  < > = values in this interval not displayed. Cardiac Enzymes:  Recent Labs  05/15/14 1340 01/06/15 1742  CKTOTAL  --  110  TROPONINI 0.03  --    BNP: Invalid input(s): POCBNP CBG:  Recent Labs  07/14/14 1445 01/06/15 1732  GLUCAP 78 69    Radiological Exams: Ct Head Wo Contrast  02/04/2015  CLINICAL DATA:  Fall, denies loss of consciousness. Hit head and right side of face. EXAM: CT HEAD WITHOUT CONTRAST CT CERVICAL SPINE WITHOUT CONTRAST TECHNIQUE: Multidetector CT imaging of the head and cervical spine was performed following the standard protocol without intravenous contrast. Multiplanar CT image reconstructions of the cervical spine were also generated. COMPARISON:  Brain MRI dated 01/06/2015 and head CT dated 01/06/2015. FINDINGS: CT HEAD FINDINGS Again noted is mild generalized brain atrophy with commensurate dilatation of the ventricles and sulci. Mild chronic small vessel ischemic change again noted within the deep periventricular white matter regions bilaterally. There is no mass, hemorrhage, edema or other evidence of acute parenchymal abnormality. No extra-axial hemorrhage. No osseous fracture or dislocation seen.  Superficial soft tissues are unremarkable. CT CERVICAL SPINE FINDINGS Degenerative changes are seen throughout the cervical spine, at least moderate in degree with associated disc space narrowings and osseous spurring. Disc-osteophytic bulges at each level but no more than mild central canal stenosis at any level. Mild levoscoliosis and mildly accentuated lordosis likely related to the underlying degenerative changes. No fracture line or displaced fracture fragment identified. Facets are well aligned throughout. There is heterogeneous enlargement of the bilateral thyroid lobes,  right greater than left. Paravertebral soft tissues are otherwise unremarkable. Scarring/fibrosis noted at each lung apex. IMPRESSION: 1. No evidence of acute intracranial abnormality. No intracranial hemorrhage or edema. No skull fracture. Mild atrophy and chronic ischemic changes, as detailed above. 2. Degenerative changes of the cervical spine, as detailed above. No fracture or acute subluxation identified within the cervical spine. 3. Heterogeneous enlargement of the thyroid gland, presumed multinodular goiter. Would consider nonemergent follow-up with thyroid ultrasound and/or nuclear medicine thyroid scan at some point to ensure benignity. Electronically Signed   By: Bary Richard M.D.   On: 02/04/2015 14:11   Ct Cervical Spine Wo Contrast  02/04/2015  CLINICAL DATA:  Fall, denies loss of consciousness. Hit head and right side of face. EXAM: CT HEAD WITHOUT CONTRAST CT CERVICAL SPINE WITHOUT CONTRAST TECHNIQUE: Multidetector CT imaging of the head and cervical spine was performed following the standard protocol without intravenous contrast. Multiplanar CT image reconstructions of the cervical spine were also generated. COMPARISON:  Brain MRI dated 01/06/2015 and head CT dated 01/06/2015. FINDINGS: CT HEAD FINDINGS Again noted is mild generalized brain atrophy with commensurate dilatation of the ventricles and sulci. Mild chronic  small vessel ischemic change again noted within the deep periventricular white matter regions bilaterally. There is no mass, hemorrhage, edema or other evidence of acute parenchymal abnormality. No extra-axial hemorrhage. No osseous fracture or dislocation seen. Superficial soft tissues are unremarkable. CT CERVICAL SPINE FINDINGS Degenerative changes are seen throughout the cervical spine, at least moderate in degree with associated disc space narrowings and osseous spurring. Disc-osteophytic bulges at each level but no more than mild central canal stenosis at any level. Mild levoscoliosis and mildly accentuated lordosis likely related to the underlying degenerative changes. No fracture line or displaced fracture fragment identified. Facets are well aligned throughout. There is heterogeneous enlargement of the bilateral thyroid lobes, right greater than left. Paravertebral soft tissues are otherwise unremarkable. Scarring/fibrosis noted at each lung apex. IMPRESSION: 1. No evidence of acute intracranial abnormality. No intracranial hemorrhage or edema. No skull fracture. Mild atrophy and chronic ischemic changes, as detailed above. 2. Degenerative changes of the cervical spine, as detailed above. No fracture or acute subluxation identified within the cervical spine. 3. Heterogeneous enlargement of the thyroid gland, presumed multinodular goiter. Would consider nonemergent follow-up with thyroid ultrasound and/or nuclear medicine thyroid scan at some point to ensure benignity. Electronically Signed   By: Bary Richard M.D.   On: 02/04/2015 14:11   Dg Foot Complete Right  02/05/2015  CLINICAL DATA:  Foot pain secondary to a fall. EXAM: RIGHT FOOT COMPLETE - 3+ VIEW COMPARISON:  Ankle radiographs dated 07/26/2014 FINDINGS: There is no evidence of fracture or dislocation. Slight degenerative changes of the head of first metatarsal. Small calcaneal enthesophytes. IMPRESSION: No acute abnormality. Electronically  Signed   By: Francene Boyers M.D.   On: 02/05/2015 08:30   Dg Hip Unilat With Pelvis 2-3 Views Right  02/04/2015  CLINICAL DATA:  Fall, generalized right hip pain. History of Parkinson's disease. EXAM: DG HIP (WITH OR WITHOUT PELVIS) 2-3V RIGHT COMPARISON:  Plain film of the pelvis and right hip dated 07/14/2014. FINDINGS: Single view of the pelvis and two views of the right hip are provided. Osseous alignment is normal. No fracture line or displaced fracture fragment identified. No significant degenerative change at either hip joint. Probable mild degenerative change within the lower lumbar spine. Soft tissues about the pelvis and right hip are unremarkable. Faint calcific density underlying the right inferior pubic  ramus suggests a developing chronic calcific tendinopathy. IMPRESSION: No acute findings.  No osseous fracture or dislocation. Electronically Signed   By: Bary Richard M.D.   On: 02/04/2015 14:17     Assessment/Plan  Physical deconditioning Will have her work with physical therapy and occupational therapy team to help with gait training and muscle strengthening exercises.fall precautions. Skin care. Encourage to be out of bed.   Acute encephalopathy On review of her culture report, will need change of antibiotic to treat her UTI. Change made as below. Monitor cbc with diff and cmp   Proteus UTI Reviewed culture/sensitivity report. Discontinue vantin. Start bactrim ds 1 tab q12h x 1 week. Add florastor 250 mg bid to prevent antibiotic associated diarrhea. Maintain hydration  Parkinson's disease Continue sinemet 25-100 mg 1 tab tid for now. Off amantadine. Outpatient neurology follow up.  Protein calorie malnutrition Monitor weekly weight. Continue medpass supplement and get dietary consult. Encourage po intake. Pressure ulcer prophylaxis.   Chronic depression Continue buspirone 15 mg bid and remeron 15 mg daily   Goals of care: short term rehabilitation   Labs/tests  ordered: cbc with diff, cmp 02/14/15  Family/ staff Communication: reviewed care plan with patient and nursing supervisor    Oneal Grout, MD  [facility] [phone] (Monday-Friday 8 am - 5 pm) [phone] (afterhours)

## 2015-02-21 ENCOUNTER — Non-Acute Institutional Stay (SKILLED_NURSING_FACILITY): Payer: Medicare Other | Admitting: Nurse Practitioner

## 2015-02-21 DIAGNOSIS — G20 Parkinson's disease: Secondary | ICD-10-CM

## 2015-02-21 DIAGNOSIS — G9340 Encephalopathy, unspecified: Secondary | ICD-10-CM

## 2015-02-21 DIAGNOSIS — N390 Urinary tract infection, site not specified: Secondary | ICD-10-CM

## 2015-02-21 DIAGNOSIS — E46 Unspecified protein-calorie malnutrition: Secondary | ICD-10-CM

## 2015-02-21 DIAGNOSIS — F329 Major depressive disorder, single episode, unspecified: Secondary | ICD-10-CM | POA: Diagnosis not present

## 2015-02-21 DIAGNOSIS — B964 Proteus (mirabilis) (morganii) as the cause of diseases classified elsewhere: Secondary | ICD-10-CM | POA: Diagnosis not present

## 2015-02-21 DIAGNOSIS — R5381 Other malaise: Secondary | ICD-10-CM

## 2015-02-21 NOTE — Progress Notes (Signed)
Patient ID: Susan Simmons, female   DOB: September 06, 1934, 81 y.o.   MRN: [id number]    Nursing Home Location:  [facility]   Place of Service: [id number]  PCP: Clayborn Heron, MD  No Known Allergies  Chief Complaint  Patient presents with  . Discharge Note    HPI:  Patient is a 81 y.o. female seen today at [facility] for discharge home. Sheis here for short term rehabilitation after hospitalization from 02/04/15-02/07/15 with generalized weakness and acute encephalopathy from acute cystitis and parkinson medication induced encephalopathy. She was started on antibiotics and her parkinson's medication was titrated by neurology. Patient currently doing well with therapy, now stable to discharge home with home health.  Review of Systems:  Review of Systems  Constitutional: Negative for activity change, appetite change, fatigue and unexpected weight change.  HENT: Negative for congestion and hearing loss.   Eyes: Negative.   Respiratory: Negative for cough and shortness of breath.   Cardiovascular: Negative for chest pain, palpitations and leg swelling.  Gastrointestinal: Negative for abdominal pain, diarrhea and constipation.  Genitourinary: Negative for dysuria and difficulty urinating.  Musculoskeletal: Negative for myalgias and arthralgias.  Skin: Negative for color change and wound.  Neurological: Positive for weakness (generalized). Negative for dizziness.  Psychiatric/Behavioral: Negative for behavioral problems, confusion and agitation.    Past Medical History  Diagnosis Date  . Anxiety   . Depression   . Parkinson disease (HCC)   . Heart murmur    Past Surgical History  Procedure Laterality Date  . Denies surgical history     Social History:   reports that she quit smoking about 18 years ago. She does not have any smokeless tobacco history on file. She reports that she does not drink alcohol or use illicit drugs.  Family History    Problem Relation Age of Onset  . Dementia Neg Hx   . Parkinsonism Neg Hx     Medications: Patient's Medications  New Prescriptions   No medications on file  Previous Medications   BUSPIRONE (BUSPAR) 15 MG TABLET    Take 15 mg by mouth 2 (two) times daily.   CARBIDOPA-LEVODOPA (SINEMET IR) 25-100 MG TABLET    Take 1 tablet by mouth 3 (three) times daily.   FEEDING SUPPLEMENT, ENSURE ENLIVE, (ENSURE ENLIVE) LIQD    Take 237 mLs by mouth 2 (two) times daily between meals.   MIRTAZAPINE (REMERON) 15 MG TABLET    Take 15 mg by mouth at bedtime.   MULTIPLE VITAMINS-MINERALS (EMERGEN-C IMMUNE PLUS PO)    Take by mouth as directed.  Modified Medications   No medications on file  Discontinued Medications   CEFPODOXIME (VANTIN) 200 MG TABLET    Take 1 tablet (200 mg total) by mouth every 12 (twelve) hours. Stop on 02/09/2015     Physical Exam: Filed Vitals:   02/21/15 1720  BP: 97/47  Pulse: 70  Temp: 98.3 F (36.8 C)  Resp: 18  Weight: 105 lb (47.628 kg)    Physical Exam  Constitutional: No distress.  Frail female NAD  HENT:  Head: Normocephalic and atraumatic.  Mouth/Throat: Oropharynx is clear and moist. No oropharyngeal exudate.  Eyes: Conjunctivae are normal. Pupils are equal, round, and reactive to light.  Neck: Normal range of motion. Neck supple.  Cardiovascular: Normal rate, regular rhythm and normal heart sounds.   Pulmonary/Chest: Effort normal and breath sounds normal.  Abdominal: Soft. Bowel sounds are normal.  Musculoskeletal: She exhibits  no edema or tenderness.  Neurological: She is alert.  Skin: Skin is warm and dry. She is not diaphoretic.  Psychiatric: She has a normal mood and affect.    Labs reviewed: Basic Metabolic Panel:  Recent Labs  16/10/96 0502 01/06/15 1753 01/06/15 1804 01/06/15 2146 02/04/15 1322 02/04/15 1604 02/05/15 0104  NA 140 141 138  --  141  --   --   K 3.7 4.2 4.1  --  5.6* 4.4  --   CL 110 103 105  --  105  --   --    CO2 26  --  25  --  30  --   --   GLUCOSE 90 85 89  --  98  --   --   BUN 9 16 16   --  18  --   --   CREATININE 0.66 0.80 0.85 0.79 1.05*  --  0.88  CALCIUM 8.6*  --  9.1  --  9.2  --   --    Liver Function Tests:  Recent Labs  11/08/14 2108 11/09/14 0710 01/06/15 1804  AST 42* 36 25  ALT 16 14 12*  ALKPHOS 58 50 71  BILITOT 0.3 0.4 0.5  PROT 6.6 5.9* 6.7  ALBUMIN 2.8* 2.5* 3.1*    Recent Labs  07/14/14 1146  LIPASE 18*    Recent Labs  11/09/14 0056 01/06/15 2001  AMMONIA 11 22   CBC:  Recent Labs  11/09/14 0710  01/06/15 1804 01/06/15 2146 02/04/15 1417 02/05/15 0104  WBC 5.1  < > 3.6* 4.4 6.5 3.9*  NEUTROABS 2.2  --  0.9*  --  2.7  --   HGB 11.7*  < > 12.2 11.7* 13.0 11.5*  HCT 36.7  < > 38.9 35.4* 41.6 36.1  MCV 88.2  < > 89.0 86.3 89.1 88.0  PLT 255  < > 325 295 283 240  < > = values in this interval not displayed. TSH:  Recent Labs  08/02/14 0441 01/06/15 1742  TSH 0.871 1.682   A1C: Lab Results  Component Value Date   HGBA1C 5.5 01/07/2015   Lipid Panel:  Recent Labs  01/07/15 0808  CHOL 162  HDL 51  LDLCALC 101*  TRIG 50  CHOLHDL 3.2    Radiological Exams: Ct Head Wo Contrast  02/04/2015  CLINICAL DATA:  Fall, denies loss of consciousness. Hit head and right side of face. EXAM: CT HEAD WITHOUT CONTRAST CT CERVICAL SPINE WITHOUT CONTRAST TECHNIQUE: Multidetector CT imaging of the head and cervical spine was performed following the standard protocol without intravenous contrast. Multiplanar CT image reconstructions of the cervical spine were also generated. COMPARISON:  Brain MRI dated 01/06/2015 and head CT dated 01/06/2015. FINDINGS: CT HEAD FINDINGS Again noted is mild generalized brain atrophy with commensurate dilatation of the ventricles and sulci. Mild chronic small vessel ischemic change again noted within the deep periventricular white matter regions bilaterally. There is no mass, hemorrhage, edema or other evidence of acute  parenchymal abnormality. No extra-axial hemorrhage. No osseous fracture or dislocation seen. Superficial soft tissues are unremarkable. CT CERVICAL SPINE FINDINGS Degenerative changes are seen throughout the cervical spine, at least moderate in degree with associated disc space narrowings and osseous spurring. Disc-osteophytic bulges at each level but no more than mild central canal stenosis at any level. Mild levoscoliosis and mildly accentuated lordosis likely related to the underlying degenerative changes. No fracture line or displaced fracture fragment identified. Facets are well aligned throughout. There is heterogeneous enlargement  of the bilateral thyroid lobes, right greater than left. Paravertebral soft tissues are otherwise unremarkable. Scarring/fibrosis noted at each lung apex. IMPRESSION: 1. No evidence of acute intracranial abnormality. No intracranial hemorrhage or edema. No skull fracture. Mild atrophy and chronic ischemic changes, as detailed above. 2. Degenerative changes of the cervical spine, as detailed above. No fracture or acute subluxation identified within the cervical spine. 3. Heterogeneous enlargement of the thyroid gland, presumed multinodular goiter. Would consider nonemergent follow-up with thyroid ultrasound and/or nuclear medicine thyroid scan at some point to ensure benignity. Electronically Signed   By: Bary Richard M.D.   On: 02/04/2015 14:11   Ct Cervical Spine Wo Contrast  02/04/2015  CLINICAL DATA:  Fall, denies loss of consciousness. Hit head and right side of face. EXAM: CT HEAD WITHOUT CONTRAST CT CERVICAL SPINE WITHOUT CONTRAST TECHNIQUE: Multidetector CT imaging of the head and cervical spine was performed following the standard protocol without intravenous contrast. Multiplanar CT image reconstructions of the cervical spine were also generated. COMPARISON:  Brain MRI dated 01/06/2015 and head CT dated 01/06/2015. FINDINGS: CT HEAD FINDINGS Again noted is mild  generalized brain atrophy with commensurate dilatation of the ventricles and sulci. Mild chronic small vessel ischemic change again noted within the deep periventricular white matter regions bilaterally. There is no mass, hemorrhage, edema or other evidence of acute parenchymal abnormality. No extra-axial hemorrhage. No osseous fracture or dislocation seen. Superficial soft tissues are unremarkable. CT CERVICAL SPINE FINDINGS Degenerative changes are seen throughout the cervical spine, at least moderate in degree with associated disc space narrowings and osseous spurring. Disc-osteophytic bulges at each level but no more than mild central canal stenosis at any level. Mild levoscoliosis and mildly accentuated lordosis likely related to the underlying degenerative changes. No fracture line or displaced fracture fragment identified. Facets are well aligned throughout. There is heterogeneous enlargement of the bilateral thyroid lobes, right greater than left. Paravertebral soft tissues are otherwise unremarkable. Scarring/fibrosis noted at each lung apex. IMPRESSION: 1. No evidence of acute intracranial abnormality. No intracranial hemorrhage or edema. No skull fracture. Mild atrophy and chronic ischemic changes, as detailed above. 2. Degenerative changes of the cervical spine, as detailed above. No fracture or acute subluxation identified within the cervical spine. 3. Heterogeneous enlargement of the thyroid gland, presumed multinodular goiter. Would consider nonemergent follow-up with thyroid ultrasound and/or nuclear medicine thyroid scan at some point to ensure benignity. Electronically Signed   By: Bary Richard M.D.   On: 02/04/2015 14:11   Dg Foot Complete Right  02/05/2015  CLINICAL DATA:  Foot pain secondary to a fall. EXAM: RIGHT FOOT COMPLETE - 3+ VIEW COMPARISON:  Ankle radiographs dated 07/26/2014 FINDINGS: There is no evidence of fracture or dislocation. Slight degenerative changes of the head of first  metatarsal. Small calcaneal enthesophytes. IMPRESSION: No acute abnormality. Electronically Signed   By: Francene Boyers M.D.   On: 02/05/2015 08:30   Dg Hip Unilat With Pelvis 2-3 Views Right  02/04/2015  CLINICAL DATA:  Fall, generalized right hip pain. History of Parkinson's disease. EXAM: DG HIP (WITH OR WITHOUT PELVIS) 2-3V RIGHT COMPARISON:  Plain film of the pelvis and right hip dated 07/14/2014. FINDINGS: Single view of the pelvis and two views of the right hip are provided. Osseous alignment is normal. No fracture line or displaced fracture fragment identified. No significant degenerative change at either hip joint. Probable mild degenerative change within the lower lumbar spine. Soft tissues about the pelvis and right hip are unremarkable. Faint calcific density  underlying the right inferior pubic ramus suggests a developing chronic calcific tendinopathy. IMPRESSION: No acute findings.  No osseous fracture or dislocation. Electronically Signed   By: Bary RichardStan  Maynard M.D.   On: 02/04/2015 14:17    Assessment/Plan 1. Urinary tract infection due to Proteus Stable, completed bactrim. No ongoing symptoms.   2. Acute encephalopathy Improved, baseline mental status  3. Parkinson disease (HCC) Stable, Continue sinemet 25-100 mg 1 tab TID; Outpatient neurology follow up.  4. Protein-calorie malnutrition (HCC) -to cont supplements and remeron  5. Major depression, chronic (HCC) Stable, conts buspirone and remeron  6. Physical deconditioning Has been at rehab due to deconditioning, will need 24- hour care on discharge. pt is stable for discharge-will need PT/OT/hha per home health. DME needed 3-1. Rx written.  will need to follow up with PCP within 2 weeks which SW will set up prior to discharge.       Janene HarveyJessica K. Biagio BorgEubanks, AGNP  [facility] [phone](Monday-Friday 8 am - 5 pm) [phone] (after hours)

## 2015-03-01 DIAGNOSIS — Z9181 History of falling: Secondary | ICD-10-CM | POA: Diagnosis not present

## 2015-03-01 DIAGNOSIS — G20 Parkinson's disease: Secondary | ICD-10-CM | POA: Diagnosis not present

## 2015-03-01 DIAGNOSIS — F319 Bipolar disorder, unspecified: Secondary | ICD-10-CM | POA: Diagnosis not present

## 2015-03-01 DIAGNOSIS — F419 Anxiety disorder, unspecified: Secondary | ICD-10-CM | POA: Diagnosis not present

## 2015-03-01 DIAGNOSIS — F0280 Dementia in other diseases classified elsewhere without behavioral disturbance: Secondary | ICD-10-CM | POA: Diagnosis not present

## 2015-03-01 DIAGNOSIS — Z87440 Personal history of urinary (tract) infections: Secondary | ICD-10-CM | POA: Diagnosis not present

## 2015-04-25 ENCOUNTER — Ambulatory Visit (INDEPENDENT_AMBULATORY_CARE_PROVIDER_SITE_OTHER): Payer: Medicare Other | Admitting: Neurology

## 2015-04-25 ENCOUNTER — Encounter: Payer: Self-pay | Admitting: Neurology

## 2015-04-25 VITALS — BP 118/58 | HR 83 | Ht 64.0 in | Wt 120.2 lb

## 2015-04-25 DIAGNOSIS — G20 Parkinson's disease: Secondary | ICD-10-CM | POA: Diagnosis not present

## 2015-04-25 NOTE — Patient Instructions (Signed)
Remember to drink plenty of fluid, eat healthy meals and do not skip any meals. Try to eat protein with a every meal and eat a healthy snack such as fruit or nuts in between meals. Try to keep a regular sleep-wake schedule and try to exercise daily, particularly in the form of walking, 20-30 minutes a day, if you can.   As far as your medications are concerned, I would like to suggest: Continue current medications  I would like to see you back in 6 months, sooner if we need to. Please call us with any interim questions, concerns, problems, updates or refill requests.   Our phone number is [phone]. We also have an after hours call service for urgent matters and there is a physician on-call for urgent questions. For any emergencies you know to call 911 or go to the nearest emergency room   

## 2015-04-25 NOTE — Progress Notes (Signed)
[facility]    Provider:  Dr Lucia Gaskins Referring Provider: Clayborn Heron, MD Primary Care Physician:  Clayborn Heron, MD  CC: Parkinsonism  HPI: Susan Simmons is a 81 y.o. female here as a referral from Dr. Barbaraann Barthel for Parkinsonism disease. Here with lovely daughter who provides most information. She can get out of bed alone. Daughter helps minimally, just being around her so she doesn't fall. She last took her sinemet anout 8:30-9am. She has told her therapist not to tell daughter how well she is doing so that she doesn't have to do it on her own at home. Gaining weight. She is not having swallowing problems. Sometimes she is a little forgetful but maybe a little bit of memory loss.   Interval history 04/25/2015: She can get up on her own. They are walking every day. Daughter says she is walking better, can ambulate on her own. She has gained weight. PT is coming to the home.   Interval history 01/25/2015: She is taking Sinemet IR twice daily. 8:30am and 5pm. She did not want to take it 3 times a day even though she was prescribed inpatient. She does not want to take the CR overnight. She does not want to take the amantadine  bid she was prescribed. No dyskinesias. Will increase Sinemet to 3x a day. They decline the amantadine and CR at night. She gets increased tremor about 1pm which is likely when the Sinemet may be wearing off. Freezing less now. Walking a little better but still significant shuffling and balance problems. No dizziness. No drooling. Her voice is hypophonic. She is getting physical therapy at home. No swallowing problems, no choking. Appetite is excellent. She has gained weight. No constipation.    Interval history 11/01/2014: Things are getting worse. Worse with walking. More shuffling. No tremor. Voice not hypophonic, She was in rehab for 6 weeks after UTI with encephalopathy. Not acting out dreams or having vivid dreams. She has some  drooling. She has difficulty getting out of a chair, won't get up on her own, won't stand on her own. Maybe some hallucinations, she will see family members that are not there. She has urinary incontinence. She has depression and mood changes. Some days her memory is excellent, other days she seems demented. Patient reports she feels she internally shaking.   HPI: Susan Simmons is a 81 y.o. female here as a referral from Dr. Barbaraann Barthel for   For 5 years, she has had bad depression since her husband passed. She laid in bed for 5 years, stopped doing everything, then had muscle disuse. Things are better now. She has moved in with her daughter. Daughter provides all information. She has been to physical therapy, been to inpatient PT, daughter works every day to keep up her strength, she goes to adult daycare. Patient still has decreased interest, decreased social interest. She can walk normal with encouragement. No falls, instability, Mother says she has tremors, "like I am scared". Daughter says she she has anxiety. Patient says she notice it when using the hand, like when dressing. Decreased concentration.She heasitates, says she is "scared to get up". Symptoms have improved since she first moved in with daughter, and some days she can even "walk normally". She has been on 14 different medications. Unclear use of dopamine blocking agents. Severe worsening depression, refractory.  Review of Systems: Patient complains of symptoms per HPI as well as the following symptoms: No CP, No SOB. Pertinent negatives per HPI. All others negative.  Social History   Social History  . Marital Status: Divorced    Spouse Name: N/A  . Number of Children: 4  . Years of Education: 11   Occupational History  . Not on file.   Social History Main Topics  . Smoking status: Former Smoker    Quit date: 03/04/1996  . Smokeless tobacco: Not on file  . Alcohol Use: No  . Drug Use: No  . Sexual Activity: Not on file    Other Topics Concern  . Not on file   Social History Narrative   Lives at home with daughter   Caffeine use: none    Family History  Problem Relation Age of Onset  . Dementia Neg Hx   . Parkinsonism Neg Hx     Past Medical History  Diagnosis Date  . Anxiety   . Depression   . Parkinson disease (HCC)   . Heart murmur     Past Surgical History  Procedure Laterality Date  . Denies surgical history      Current Outpatient Prescriptions  Medication Sig Dispense Refill  . busPIRone (BUSPAR) 15 MG tablet Take 15 mg by mouth 2 (two) times daily.    . carbidopa-levodopa (SINEMET IR) 25-100 MG tablet Take 1 tablet by mouth 3 (three) times daily.    . feeding supplement, ENSURE ENLIVE, (ENSURE ENLIVE) LIQD Take 237 mLs by mouth 2 (two) times daily between meals. 237 mL 12  . mirtazapine (REMERON) 15 MG tablet Take 15 mg by mouth at bedtime.    . Multiple Vitamins-Minerals (EMERGEN-C IMMUNE PLUS PO) Take by mouth as directed.     No current facility-administered medications for this visit.    Allergies as of 04/25/2015  . (No Known Allergies)    Vitals: BP 118/58 mmHg  Pulse 83  Ht 5\' 4"  (1.626 m)  Wt 120 lb 3.2 oz (54.522 kg)  BMI 20.62 kg/m2 Last Weight:  Wt Readings from Last 1 Encounters:  04/25/15 120 lb 3.2 oz (54.522 kg)   Last Height:   Ht Readings from Last 1 Encounters:  04/25/15 5\' 4"  (1.626 m)      Speech:  Not aphasic, not dysarthric Cognition:   The patient is oriented to person  Cranial Nerves:Decreased blink reflex  The pupils are equal, round, and reactive to light. Visual fields are full to finger confrontation. Extraocular movements are intact. Trigeminal sensation is intact and the muscles of mastication are normal. The face is symmetric. The palate elevates in the midline. Hearing intact. Voice is normal. Shoulder shrug is normal. The tongue has normal motion without fasciculations.   Coordination:  bradykinetic  Gait:   significant Shuffling, improved freezing, decreased arm swing with re-emergent right hand tremor  Motor Observation:  No resting tremor Tone:  increased in the upper extremities with cogwheeling  Posture:  Posture is severely stooped   Strength:  Poor effort, anti-gravity, symmetric     Assessment/Plan: 81 year old female with parkinsonism She has shuffling gait, decreased arm swing, decreased blink reflex, Upper extremity cogwheeling. She has responded to Sinemet IR. She is on it twice a day at 8 AM and 5 PM and notices reemergence of symptoms at 1 PM likely when the mediction is wearing off. She still has shuffling gait but it is improved, freezing has improved, still with significant balance problems. She has physical therapy at home currently and doing very well. She lives at home with her lovelydaughter and has very good care. Will increase Sinemet 3 times  a day. If any changes in mentation then go back to 2 times a day and call the office immediately. Fall precautions. Return in 3 months and call the office for anything needed. MRI of the brain was unremarkable.   Cc: dr. Ardelia Mems, MD  [facility] [address] [place], [place] [address]  Phone [phone] Fax [phone]  A total of 30 minutes was spent face-to-face with this patient. Over half this time was spent on counseling patient on the parkinsonism diagnosis and different diagnostic and therapeutic options available.

## 2015-10-13 ENCOUNTER — Encounter (HOSPITAL_COMMUNITY): Payer: Self-pay | Admitting: Emergency Medicine

## 2015-10-13 DIAGNOSIS — M25531 Pain in right wrist: Secondary | ICD-10-CM | POA: Insufficient documentation

## 2015-10-13 DIAGNOSIS — W06XXXA Fall from bed, initial encounter: Secondary | ICD-10-CM | POA: Insufficient documentation

## 2015-10-13 DIAGNOSIS — G20 Parkinson's disease: Secondary | ICD-10-CM | POA: Diagnosis not present

## 2015-10-13 DIAGNOSIS — Z87891 Personal history of nicotine dependence: Secondary | ICD-10-CM | POA: Diagnosis not present

## 2015-10-13 DIAGNOSIS — Y999 Unspecified external cause status: Secondary | ICD-10-CM | POA: Insufficient documentation

## 2015-10-13 DIAGNOSIS — Y939 Activity, unspecified: Secondary | ICD-10-CM | POA: Diagnosis not present

## 2015-10-13 DIAGNOSIS — Y92009 Unspecified place in unspecified non-institutional (private) residence as the place of occurrence of the external cause: Secondary | ICD-10-CM | POA: Diagnosis not present

## 2015-10-13 NOTE — ED Triage Notes (Signed)
Pt states she was reaching for something and fell off the bed and hit the right side of her head on the table  Denies LOC  Denies pain  Pt states she has been a little dizzy since the fall

## 2015-10-14 ENCOUNTER — Emergency Department (HOSPITAL_COMMUNITY)
Admission: EM | Admit: 2015-10-14 | Discharge: 2015-10-14 | Disposition: A | Discharge status: Home / Self Care | Payer: Medicare Other | Attending: Emergency Medicine | Admitting: Emergency Medicine

## 2015-10-14 ENCOUNTER — Emergency Department (HOSPITAL_COMMUNITY): Payer: Medicare Other

## 2015-10-14 DIAGNOSIS — W19XXXA Unspecified fall, initial encounter: Secondary | ICD-10-CM

## 2015-10-14 LAB — URINALYSIS, ROUTINE W REFLEX MICROSCOPIC
BILIRUBIN URINE: NEGATIVE
Glucose, UA: NEGATIVE mg/dL
Hgb urine dipstick: NEGATIVE
KETONES UR: NEGATIVE mg/dL
Leukocytes, UA: NEGATIVE
NITRITE: NEGATIVE
Protein, ur: NEGATIVE mg/dL
Specific Gravity, Urine: 1.018 (ref 1.005–1.030)
pH: 5.5 (ref 5.0–8.0)

## 2015-10-14 NOTE — ED Notes (Signed)
Pt refused vitals 

## 2015-10-14 NOTE — ED Notes (Addendum)
Called daughter of pt- said she was coming to pick her up

## 2015-10-14 NOTE — ED Notes (Signed)
Per daughter, pt is not acting like herself and would like to have her tested for a UTI.

## 2015-10-14 NOTE — ED Notes (Signed)
Daughter picked pt up

## 2015-10-14 NOTE — ED Provider Notes (Signed)
[facility]-EMERGENCY DEPT Provider Note   CSN: [id number] Arrival date & time: 10/13/15  2014  By signing my name below, I, Majel Homer, attest that this documentation has been prepared under the direction and in the presence of non-physician practitioner, Elpidio Anis, PA-C. Electronically Signed: Majel Homer, Scribe. 10/14/2015. 1:53 AM.  History   Chief Complaint Chief Complaint  Patient presents with  . Fall   The history is provided by the patient, medical records and the EMS personnel. No language interpreter was used.   HPI Comments: Susan Simmons is a 81 y.o. female with PMHx of anxiety, depression, heart murmur and parkinson's disease, who presents to the Emergency Department complaining of gradually improving, right side and arm pain s/p a fall that occurred this evening. Per EMS, pt was reaching for something when she fell off her bed and struck the right side of her head on a side table. She did not lose consciousness. Pt denies chest pain. There has been no vomiting. The patient currently denies pain.  Past Medical History:  Diagnosis Date  . Anxiety   . Depression   . Heart murmur   . Parkinson disease [facility])     Patient Active Problem List   Diagnosis Date Noted  . UTI (urinary tract infection) 02/05/2015  . Parkinsonism (HCC) 01/25/2015  . Abnormality of gait   . Leukopenia 01/07/2015  . Anxiety 11/12/2014  . Malnutrition of moderate degree 11/10/2014  . Pressure ulcer stage II 11/09/2014  . Acute encephalopathy 08/02/2014  . Depression 05/02/2014    Past Surgical History:  Procedure Laterality Date  . denies surgical history      OB History    No data available     Home Medications    Prior to Admission medications   Medication Sig Start Date End Date Taking? Authorizing Provider  busPIRone (BUSPAR) 15 MG tablet Take 15 mg by mouth 2 (two) times daily.    Historical Provider, MD  carbidopa-levodopa (SINEMET IR) 25-100 MG tablet Take 1 tablet by mouth 3  (three) times daily. 02/07/15   Leroy Sea, MD  feeding supplement, ENSURE ENLIVE, (ENSURE ENLIVE) LIQD Take 237 mLs by mouth 2 (two) times daily between meals. 11/11/14   Vassie Loll, MD  mirtazapine (REMERON) 15 MG tablet Take 15 mg by mouth at bedtime.    Historical Provider, MD  Multiple Vitamins-Minerals (EMERGEN-C IMMUNE PLUS PO) Take by mouth as directed.    Historical Provider, MD    Family History Family History  Problem Relation Age of Onset  . Dementia Neg Hx   . Parkinsonism Neg Hx     Social History Social History  Substance Use Topics  . Smoking status: Former Smoker    Quit date: 03/04/1996  . Smokeless tobacco: Never Used  . Alcohol use No    Allergies   Review of patient's allergies indicates no known allergies.  Review of Systems Review of Systems  Constitutional: Negative for fever.  Cardiovascular: Negative for chest pain.  Gastrointestinal: Negative for abdominal pain and vomiting.  Musculoskeletal: Positive for arthralgias (Right wrist). Negative for back pain and neck pain.  Neurological: Positive for tremors (chronic with history of parkinson's). Negative for syncope, facial asymmetry, weakness and headaches.   Physical Exam Updated Vital Signs BP 137/64 (BP Location: Left Arm)   Pulse 92   Temp 98.8 F (37.1 C) (Oral)   Resp 20   Ht 5\' 2"  (1.575 m)   Wt 117 lb (53.1 kg)   SpO2 98%  BMI 21.40 kg/m   Physical Exam  Constitutional: She is oriented to person, place, and time. She appears well-developed and well-nourished.  HENT:  Head: Normocephalic and atraumatic.  Eyes: Conjunctivae are normal. Pupils are equal, round, and reactive to light. Right eye exhibits no discharge. Left eye exhibits no discharge. No scleral icterus.  Neck: Normal range of motion. No JVD present. No tracheal deviation present.  Cardiovascular: Normal rate.   Pulmonary/Chest: Effort normal. No stridor. She has no wheezes. She has no rales.  Abdominal: There is  no tenderness.  Musculoskeletal: Normal range of motion. She exhibits no edema.  Neurological: She is alert and oriented to person, place, and time. Coordination normal.  Ambulatory with assistance.  Psychiatric: She has a normal mood and affect. Her behavior is normal. Judgment and thought content normal.  Nursing note and vitals reviewed.  ED Treatments / Results  Labs (all labs ordered are listed, but only abnormal results are displayed) Labs Reviewed - No data to display  EKG  EKG Interpretation None       Radiology No results found.  Procedures Procedures  DIAGNOSTIC STUDIES:  Oxygen Saturation is 100% on RA, normal by my interpretation.    COORDINATION OF CARE:  1:51 AM Discussed treatment plan with pt at bedside and pt agreed to plan.  Medications Ordered in ED Medications - No data to display   Initial Impression / Assessment and Plan / ED Course  I have reviewed the triage vital signs and the nursing notes.  Pertinent labs & imaging results that were available during my care of the patient were reviewed by me and considered in my medical decision making (see chart for details).  Clinical Course  1. Fall at home   Patient arrives after fall at home. No physical exam findings of concern. The patient is in NAD. VSS. Negative CT of head and neck performed because of history of hitting her head. She is felt stable for discharge home.   I personally performed the services described in this documentation, which was scribed in my presence. The recorded information has been reviewed and is accurate.   Final Clinical Impressions(s) / ED Diagnoses   Final diagnoses:  None    New Prescriptions New Prescriptions   No medications on file     Elpidio AnisShari Lillyana Majette, PA-C 10/14/15 0425    Melene Planan Floyd, DO 10/14/15 [phone]

## 2015-10-14 NOTE — ED Notes (Signed)
Bed: WA10 Expected date:  Expected time:  Means of arrival:  Comments: No monitor 

## 2015-11-18 ENCOUNTER — Encounter (HOSPITAL_COMMUNITY): Payer: Self-pay

## 2015-11-18 ENCOUNTER — Emergency Department (HOSPITAL_COMMUNITY): Payer: Medicare Other

## 2015-11-18 ENCOUNTER — Observation Stay (HOSPITAL_COMMUNITY)
Admission: EM | Admit: 2015-11-18 | Discharge: 2015-11-20 | Disposition: A | Discharge status: Home / Self Care | Payer: Medicare Other | Attending: Internal Medicine | Admitting: Internal Medicine

## 2015-11-18 DIAGNOSIS — G20 Parkinson's disease: Secondary | ICD-10-CM | POA: Insufficient documentation

## 2015-11-18 DIAGNOSIS — R262 Difficulty in walking, not elsewhere classified: Secondary | ICD-10-CM

## 2015-11-18 DIAGNOSIS — Z87891 Personal history of nicotine dependence: Secondary | ICD-10-CM | POA: Insufficient documentation

## 2015-11-18 DIAGNOSIS — R109 Unspecified abdominal pain: Secondary | ICD-10-CM | POA: Diagnosis present

## 2015-11-18 DIAGNOSIS — Z79899 Other long term (current) drug therapy: Secondary | ICD-10-CM | POA: Diagnosis not present

## 2015-11-18 DIAGNOSIS — R079 Chest pain, unspecified: Secondary | ICD-10-CM | POA: Insufficient documentation

## 2015-11-18 DIAGNOSIS — N390 Urinary tract infection, site not specified: Secondary | ICD-10-CM | POA: Diagnosis not present

## 2015-11-18 DIAGNOSIS — G9340 Encephalopathy, unspecified: Secondary | ICD-10-CM | POA: Diagnosis present

## 2015-11-18 DIAGNOSIS — Z23 Encounter for immunization: Secondary | ICD-10-CM | POA: Insufficient documentation

## 2015-11-18 LAB — URINE MICROSCOPIC-ADD ON: RBC / HPF: NONE SEEN RBC/hpf (ref 0–5)

## 2015-11-18 LAB — CBC
HCT: 40 % (ref 36.0–46.0)
Hemoglobin: 13.4 g/dL (ref 12.0–15.0)
MCH: 29.2 pg (ref 26.0–34.0)
MCHC: 33.5 g/dL (ref 30.0–36.0)
MCV: 87.1 fL (ref 78.0–100.0)
Platelets: 273 10*3/uL (ref 150–400)
RBC: 4.59 MIL/uL (ref 3.87–5.11)
RDW: 12.9 % (ref 11.5–15.5)
WBC: 7.5 10*3/uL (ref 4.0–10.5)

## 2015-11-18 LAB — BASIC METABOLIC PANEL
Anion gap: 7 (ref 5–15)
BUN: 25 mg/dL — AB (ref 4–21)
BUN: 25 mg/dL — AB (ref 6–20)
CALCIUM: 9.3 mg/dL (ref 8.9–10.3)
CO2: 25 mmol/L (ref 22–32)
CREATININE: 1.13 mg/dL — AB (ref 0.44–1.00)
Chloride: 107 mmol/L (ref 101–111)
Creatinine: 1.1 mg/dL (ref 0.5–1.1)
GFR calc Af Amer: 52 mL/min — ABNORMAL LOW (ref >60)
GFR, EST NON AFRICAN AMERICAN: 45 mL/min — AB (ref >60)
GLUCOSE: 106 mg/dL — AB (ref 65–99)
Glucose: 106 mg/dL
Potassium: 4.3 mmol/L (ref 3.4–5.3)
Potassium: 4.3 mmol/L (ref 3.5–5.1)
SODIUM: 139 mmol/L (ref 137–147)
Sodium: 139 mmol/L (ref 135–145)

## 2015-11-18 LAB — URINALYSIS, ROUTINE W REFLEX MICROSCOPIC
Bilirubin Urine: NEGATIVE
GLUCOSE, UA: NEGATIVE mg/dL
HGB URINE DIPSTICK: NEGATIVE
KETONES UR: NEGATIVE mg/dL
Nitrite: POSITIVE — AB
PROTEIN: NEGATIVE mg/dL
Specific Gravity, Urine: 1.024 (ref 1.005–1.030)
pH: 6 (ref 5.0–8.0)

## 2015-11-18 LAB — I-STAT CG4 LACTIC ACID, ED: Lactic Acid, Venous: 1.06 mmol/L (ref 0.5–1.9)

## 2015-11-18 LAB — CBC AND DIFFERENTIAL: WBC: 7.5 10^3/mL

## 2015-11-18 LAB — I-STAT TROPONIN, ED: TROPONIN I, POC: 0.03 ng/mL (ref 0.00–0.08)

## 2015-11-18 MED ORDER — DEXTROSE 5 % IV SOLN
1.0000 g | Freq: Once | INTRAVENOUS | Status: AC
  Administered 2015-11-18: 1 g via INTRAVENOUS
  Filled 2015-11-18: qty 10

## 2015-11-18 MED ORDER — SODIUM CHLORIDE 0.9 % IV BOLUS (SEPSIS)
1000.0000 mL | Freq: Once | INTRAVENOUS | Status: AC
  Administered 2015-11-18: 1000 mL via INTRAVENOUS

## 2015-11-18 NOTE — ED Triage Notes (Signed)
Patient now expressing to this RN that she is having chest pain.  Will perform EKG.

## 2015-11-18 NOTE — ED Provider Notes (Signed)
[facility]-EMERG[facility]Y DEPT Provider Note   CSN: [id number] Arrival date & time: 11/18/15  1929  By signing my name below, I, Alyssa GroveMartin Green, attest that this documentation has been prepared under the direction and in the presence of TRW AutomotiveKelly Alaylah Heatherington, PA-C. Electronically Signed: Alyssa GroveMartin Green, ED Scribe. 11/18/15. 10:25 PM.    History   Chief Complaint Chief Complaint  Patient presents with  . Chest Pain  . Abdominal Pain  . Fall   The history is provided by the patient and a relative (Daughter). No language interpreter was used.    HPI Comments: Susan Simmons is a 81 y.o. female with PMHx of Anxiety, Depression, Parkinson disease and Heart murmur who presents to the Emergency Department complaining of gradual onset, constant weakness and fatigue onset 2 days. Pt fell 2 days ago due to weakness and per daughter, she has not been herself for the last 2 days. Daughter states she has noticed an overall decline in patients overall physical abilities over the last few days. Pt has had similar symptoms with UTI last year. Daughter reports difficulty walking, abdominal pain, chest pain, confusion, urinary frequency, decreased appetite and generalized body aches. She does not normally use a walker or any other walking aid at home, but now is unable to walk on her own. Pt has not received any medication for pain management. Pt denies current pain. Denies fever, nausea, dysuria,    Past Medical History:  Diagnosis Date  . Anxiety   . Depression   . Heart murmur   . Parkinson disease [facility](HCC)     Patient Active Problem List   Diagnosis Date Noted  . UTI (urinary tract infection) 02/05/2015  . Parkinsonism (HCC) 01/25/2015  . Abnormality of gait   . Leukopenia 01/07/2015  . Anxiety 11/12/2014  . Malnutrition of moderate degree 11/10/2014  . Pressure ulcer stage II 11/09/2014  . Acute encephalopathy 08/02/2014  . Depression 05/02/2014    Past Surgical History:  Procedure Laterality Date  . denies  surgical history      OB History    No data available     Home Medications    Prior to Admission medications   Medication Sig Start Date End Date Taking? Authorizing Provider  busPIRone (BUSPAR) 15 MG tablet Take 15 mg by mouth 2 (two) times daily.   Yes Historical Provider, MD  carbidopa-levodopa (SINEMET IR) 25-100 MG tablet Take 1 tablet by mouth 3 (three) times daily. Patient taking differently: Take 1 tablet by mouth 2 (two) times daily.  02/07/15  Yes Leroy SeaPrashant K Singh, MD  feeding supplement, ENSURE ENLIVE, (ENSURE ENLIVE) LIQD Take 237 mLs by mouth 2 (two) times daily between meals. Patient taking differently: Take 237 mLs by mouth 2 (two) times daily as needed (when appettite is low).  11/11/14  Yes Vassie Lollarlos Madera, MD  mirtazapine (REMERON) 15 MG tablet Take 15 mg by mouth at bedtime.   Yes Historical Provider, MD    Family History Family History  Problem Relation Age of Onset  . Dementia Neg Hx   . Parkinsonism Neg Hx     Social History Social History  Substance Use Topics  . Smoking status: Former Smoker    Quit date: 03/04/1996  . Smokeless tobacco: Never Used  . Alcohol use No     Allergies   Review of patient's allergies indicates no known allergies.   Review of Systems Review of Systems A complete 10 system review of systems was obtained and all systems are negative except as noted in  the HPI and PMH.     Physical Exam Updated Vital Signs BP 146/64   Pulse 85   Temp 98.3 F (36.8 C) (Oral)   Resp 20   SpO2 100%   Physical Exam  Constitutional: She appears well-developed and well-nourished. No distress.  Nontoxic-appearing  HENT:  Head: Normocephalic and atraumatic.  Eyes: Conjunctivae and EOM are normal. No scleral icterus.  Neck: Normal range of motion.  Cardiovascular: Normal rate, regular rhythm and intact distal pulses.   Pulmonary/Chest: Effort normal. No respiratory distress. She has no wheezes. She has no rales.  Respirations even and  unlabored. Lungs clear to auscultation bilaterally.  Abdominal: Soft. She exhibits no distension. There is no tenderness. There is no guarding.  Soft abdomen without focal tenderness. No masses. No peritoneal signs.  Musculoskeletal: Normal range of motion.  Neurological: She is alert. No cranial nerve deficit. She exhibits normal muscle tone. Coordination normal.  GCS 15. Mild confusion noted. No focal deficits on neurologic exam. Patient moving extremities without ataxia.  Skin: Skin is warm and dry. No rash noted. She is not diaphoretic. No erythema. No pallor.  Psychiatric: She has a normal mood and affect. Her behavior is normal.  Nursing note and vitals reviewed.   ED Treatments / Results  DIAGNOSTIC STUDIES: Oxygen Saturation is 100% on RA, normal by my interpretation.    COORDINATION OF CARE: 10:17 PM Discussed treatment plan with pt at bedside which includes Urinalysis and pt agreed to plan.  Labs (all labs ordered are listed, but only abnormal results are displayed) Labs Reviewed  BASIC METABOLIC PANEL - Abnormal; Notable for the following:       Result Value   Glucose, Bld 106 (*)    BUN 25 (*)    Creatinine, Ser 1.13 (*)    GFR calc non Af Amer 45 (*)    GFR calc Af Amer 52 (*)    All other components within normal limits  URINALYSIS, ROUTINE W REFLEX MICROSCOPIC (NOT AT [facility]) - Abnormal; Notable for the following:    Nitrite POSITIVE (*)    Leukocytes, UA TRACE (*)    All other components within normal limits  URINE MICROSCOPIC-ADD ON - Abnormal; Notable for the following:    Squamous Epithelial / LPF 0-5 (*)    Bacteria, UA MANY (*)    All other components within normal limits  URINE CULTURE  CBC  I-STAT TROPOININ, ED  I-STAT CG4 LACTIC ACID, ED    EKG  EKG Interpretation  Date/Time:  Friday November 18 2015 20:39:36 EDT Ventricular Rate:  83 PR Interval:    QRS Duration: 95 QT Interval:  385 QTC Calculation: 453 R Axis:   -40 Text Interpretation:   Sinus rhythm LVH with secondary repolarization abnormality agree. no change from previous. Confirmed by Donnald Garre, MD, Lebron Conners [phone]) on 11/19/2015 12:10:02 AM       Radiology Dg Chest 2 View  Result Date: 11/18/2015 CLINICAL DATA:  Chest pain EXAM: CHEST  2 VIEW COMPARISON:  01/06/2015 FINDINGS: The heart size and mediastinal contours are within normal limits. Both lungs are clear. The visualized skeletal structures are unremarkable. IMPRESSION: No active cardiopulmonary disease. Electronically Signed   By: Marlan Palau M.D.   On: 11/18/2015 21:35    Procedures Procedures (including critical care time)  Medications Ordered in ED Medications  sodium chloride 0.9 % bolus 1,000 mL (0 mLs Intravenous Stopped 11/19/15 0050)  cefTRIAXone (ROCEPHIN) 1 g in dextrose 5 % 50 mL IVPB (0 g Intravenous Stopped  11/19/15 0050)     Initial Impression / Assessment and Plan / ED Course  I have reviewed the triage vital signs and the nursing notes.  Pertinent labs & imaging results that were available during my care of the patient were reviewed by me and considered in my medical decision making (see chart for details).  Clinical Course    81 year old female who lives at home with her daughter, presents to the emergency department for evaluation of generalized weakness. Per daughter, the patient usually ambulates independently. She has been unable to do this for the past 24 hours. Daughter reports that symptoms are similar to when the patient had a urinary tract infection. There is evidence of this today. Patient does not meet SIRS or sepsis criteria; however, she has had a significant decline in her ADLs compared to her baseline. She is unable to ambulate while in the emergency department. Case discussed with Dr. Maryfrances Bunnell of [facility] who is agreeable to observation admission. IV Rocephin given in the emergency department. Temporary admission orders placed.   Final Clinical Impressions(s) / ED Diagnoses    Final diagnoses:  Acute lower UTI (urinary tract infection)    New Prescriptions New Prescriptions   No medications on file    I personally performed the services described in this documentation, which was scribed in my presence. The recorded information has been reviewed and is accurate.      Antony Madura, PA-C 11/19/15 4540    Arby Barrette, MD 11/19/15 [phone]

## 2015-11-18 NOTE — ED Triage Notes (Signed)
Patient c/o abdominal pain that began yesterday.  Patients daughter states that mother is normally able to walk and get herself to the restroom and for the last 2 days has not been able to.  Patient states that also fell x2 days ago and patient states that she did his her head, denies LOC.  Patient states that her body aches all over. Patients daughter states that she has not been herself x2 days.  Patient is alert and is able to speak about her situation in triage.

## 2015-11-19 ENCOUNTER — Encounter (HOSPITAL_COMMUNITY): Payer: Self-pay | Admitting: Family Medicine

## 2015-11-19 DIAGNOSIS — G9340 Encephalopathy, unspecified: Secondary | ICD-10-CM | POA: Diagnosis not present

## 2015-11-19 DIAGNOSIS — G20 Parkinson's disease: Secondary | ICD-10-CM | POA: Diagnosis not present

## 2015-11-19 DIAGNOSIS — N3000 Acute cystitis without hematuria: Secondary | ICD-10-CM | POA: Diagnosis not present

## 2015-11-19 MED ORDER — SODIUM CHLORIDE 0.9 % IV SOLN
INTRAVENOUS | Status: AC
  Administered 2015-11-19: 05:00:00 via INTRAVENOUS

## 2015-11-19 MED ORDER — MIRTAZAPINE 15 MG PO TABS
15.0000 mg | ORAL_TABLET | Freq: Every day | ORAL | Status: DC
  Administered 2015-11-19: 15 mg via ORAL
  Filled 2015-11-19: qty 1

## 2015-11-19 MED ORDER — ACETAMINOPHEN 650 MG RE SUPP: 650.0000 mg | Freq: Four times a day (QID) | RECTAL | Status: DC | PRN

## 2015-11-19 MED ORDER — ENOXAPARIN SODIUM 40 MG/0.4ML ~~LOC~~ SOLN
40.0000 mg | SUBCUTANEOUS | Status: DC
  Administered 2015-11-19 – 2015-11-20 (×2): 40 mg via SUBCUTANEOUS
  Filled 2015-11-19: qty 0.4

## 2015-11-19 MED ORDER — BUSPIRONE HCL 5 MG PO TABS
15.0000 mg | ORAL_TABLET | Freq: Two times a day (BID) | ORAL | Status: DC
  Administered 2015-11-19 – 2015-11-20 (×3): 15 mg via ORAL
  Filled 2015-11-19 (×3): qty 3

## 2015-11-19 MED ORDER — PNEUMOCOCCAL VAC POLYVALENT 25 MCG/0.5ML IJ INJ
0.5000 mL | INJECTION | INTRAMUSCULAR | Status: AC
  Administered 2015-11-20: 0.5 mL via INTRAMUSCULAR
  Filled 2015-11-19 (×2): qty 0.5

## 2015-11-19 MED ORDER — ONDANSETRON HCL 4 MG/2ML IJ SOLN: 4.0000 mg | Freq: Four times a day (QID) | INTRAMUSCULAR | Status: DC | PRN

## 2015-11-19 MED ORDER — CEPHALEXIN 500 MG PO CAPS
500.0000 mg | ORAL_CAPSULE | Freq: Two times a day (BID) | ORAL | Status: DC
  Administered 2015-11-19 – 2015-11-20 (×2): 500 mg via ORAL
  Filled 2015-11-19 (×2): qty 1

## 2015-11-19 MED ORDER — ONDANSETRON HCL 4 MG PO TABS
4.0000 mg | ORAL_TABLET | Freq: Four times a day (QID) | ORAL | Status: DC | PRN
  Administered 2015-11-19: 4 mg via ORAL
  Filled 2015-11-19: qty 1

## 2015-11-19 MED ORDER — INFLUENZA VAC SPLIT QUAD 0.5 ML IM SUSY
0.5000 mL | PREFILLED_SYRINGE | INTRAMUSCULAR | Status: AC
  Administered 2015-11-20: 0.5 mL via INTRAMUSCULAR
  Filled 2015-11-19: qty 0.5

## 2015-11-19 MED ORDER — ENSURE ENLIVE PO LIQD: 237.0000 mL | Freq: Two times a day (BID) | ORAL | Status: DC | PRN

## 2015-11-19 MED ORDER — CARBIDOPA-LEVODOPA 25-100 MG PO TABS
1.0000 | ORAL_TABLET | Freq: Two times a day (BID) | ORAL | Status: DC
  Administered 2015-11-19 – 2015-11-20 (×3): 1 via ORAL
  Filled 2015-11-19 (×3): qty 1

## 2015-11-19 MED ORDER — ACETAMINOPHEN 325 MG PO TABS
650.0000 mg | ORAL_TABLET | Freq: Four times a day (QID) | ORAL | Status: DC | PRN
Start: 2015-11-19 — End: 2015-11-20

## 2015-11-19 NOTE — ED Notes (Signed)
Report on pt given to Floor Unit RN. 

## 2015-11-19 NOTE — Care Management Note (Addendum)
Case Management Note  Patient Details  Name: Susan GasserShirley Simmons MRN: [id number] Date of Birth: February 29, 1934  Subjective/Objective:  UTI, dehydration, fall, encephalopathy                 Action/Plan: Discharge Planning:  NCM spoke to pt's Suann Larrydtr, [name] Adams dtr # [phone]. PT recommended SNF. Dtr states [facility] the past and she prefers that facility. Pt has RW and cane at home. Lives at home with dtr. CSW referral for SNF;  Ricky StabsPCP-RANKINS, [name] MD  Expected Discharge Date:  11/20/2015             Expected Discharge Plan:  Skilled Nursing Facility  In-House Referral:  Clinical Social Work  Discharge planning Services  CM Consult  Post Acute Care Choice:  NA Choice offered to:  NA  DME Arranged:  N/A DME Agency:  NA  HH Arranged:  NA HH Agency:  NA  Status of Service:  Completed, signed off  If discussed at [employer] Length of Stay Meetings, dates discussed:    Additional Comments:  Elliot CousinShavis, Zyasia Halbleib Ellen, RN 11/19/2015, 4:29 PM

## 2015-11-19 NOTE — Care Management Obs Status (Signed)
MEDICARE OBSERVATION STATUS NOTIFICATION   Patient Details  Name: Sabino GasserShirley Langhorne MRN: [id number] Date of Birth: 11-Aug-1934   Medicare Observation Status Notification Given:  Yes Explained to pt's dtr, Deneise LeverPam Adams # [phone] via phone.  Elliot CousinShavis, Quindarrius [place]op[place] Ellen, RN 11/19/2015, 4:57 PM

## 2015-11-19 NOTE — Progress Notes (Signed)
Susan GasserShirley Simmons is a 81 y.o. female with a past medical history significant for Parkinson's disease followed by Dr. Lucia GaskinsAhern and dementia who presents with 3 days progressive weakness., was found to have a UTI, started on rocephin and transitioned to oral keflex. Awaiting PT evaluation .  Please see Dr Sheryn Bisonanford's note in detail for H&P.  She was admitted earlier this am.    Susan Modyvijaya Rollyn Scialdone, MD [phone]

## 2015-11-19 NOTE — H&P (Signed)
History and Physical  Patient Name: Susan Simmons     NWG:[id number]    DOB: Jun 21, 1934    DOA: 11/18/2015 PCP: Clayborn Heron, MD   Patient coming from: Home  Chief Complaint: Weakness  HPI: Susan Simmons is a 81 y.o. female with a past medical history significant for Parkinson's disease followed by Dr. Lucia Gaskins and dementia who presents with 3 days progressive weakness.  The patient was in her usual state of health (lives with her daughter, dependent for IADLs, but otherwise alert, can be home alone, oriented to place and situation) until about three days ago when her daughter noticed she was less active than usual.  She normally does exercises in a chair, can ride a stationary bike, walks, but she was not doing those things.  Then over the last three days, she was weaker and weaker, not taking much by mouth, and appeared more tired and less alert as well, until today, her daughter was bringing her to the bathroom and her legs gave out and she collapsed, so her daughter brought her to the ER.    ED course: -Afebrile, heart rate and respirations normal, BP initially 105/50, improved to 145/65 with 1L NS -In triage, she was able to express herself, complained of "body aches all over", but after being in the ER, she was noted to be unable to stand without 2 person assist. -Na 139, K 4.3, Cr 1.13 (baseline 0.9), WBC 7.5K, Hgb 13.4 -Lactic acid 1.05, troponin normal, ECG showed LVH and no ischemic changes -UA showed bacteria without pyuria, but nitrites and LE -CXR without pneumonia -She was given 1L NS and ceftriaxone and TRH were asked to evaluate for UTI with encephalopathy   Of note, she was admitted with UTI, dehydration and resulting encephalopathy last Dec., here for 4 days, went to SNF for rehab at discharge.  Proteus, cefazolin sensitive.  The patient is encephalopathic but her daughter notes increased urine, decreased oral intake, foul-smelling urine, diarrhea this week.   Denies fever, productive cough, respiratory distress.          ROS: Review of Systems  Unable to perform ROS: Other (encephalopathy)          Past Medical History:  Diagnosis Date  . Anxiety   . Depression   . Heart murmur   . Parkinson disease [facility])     Past Surgical History:  Procedure Laterality Date  . denies surgical history      Social History: Patient lives with her daughter.  The patient walks unassisted.  She is a former smoker.    No Known Allergies  Family history: No family history of dementia.  Prior to Admission medications   Medication Sig Start Date End Date Taking? Authorizing Provider  busPIRone (BUSPAR) 15 MG tablet Take 15 mg by mouth 2 (two) times daily.   Yes Historical Provider, MD  carbidopa-levodopa (SINEMET IR) 25-100 MG tablet Take 1 tablet by mouth 3 (three) times daily. Patient taking differently: Take 1 tablet by mouth 2 (two) times daily.  02/07/15  Yes Leroy Sea, MD  feeding supplement, ENSURE ENLIVE, (ENSURE ENLIVE) LIQD Take 237 mLs by mouth 2 (two) times daily between meals. Patient taking differently: Take 237 mLs by mouth 2 (two) times daily as needed (when appettite is low).  11/11/14  Yes Vassie Loll, MD  mirtazapine (REMERON) 15 MG tablet Take 15 mg by mouth at bedtime.   Yes Historical Provider, MD       Physical Exam: BP 146/64  Pulse 85   Temp 98.3 F (36.8 C) (Oral)   Resp 20   SpO2 100%  General appearance: Well-developed, elderly adult female, awake but sluggish.    Eyes: Anicteric, conjunctiva pink, lids and lashes normal. PERRL.    ENT: No nasal deformity, discharge, epistaxis.  Hearing normal. OP moist without lesions.   Neck: No neck masses.  Trachea midline.  No thyromegaly/tenderness. Lymph: No cervical or supraclavicular lymphadenopathy. Skin: Warm and diaphoretic.  No jaundice.  No suspicious rashes or lesions. Cardiac: RRR, nl S1-S2, S4 or systolic murmur.  Capillary refill is brisk.  JVP  normal.  No LE edema.  Radial and DP pulses 2+ and symmetric. Respiratory: Normal respiratory rate and rhythm.  CTAB without rales or wheezes. Abdomen: Abdomen soft.  No TTP, voluntary guarding present. No ascites, distension, hepatosplenomegaly.   MSK: No deformities or effusions.  No cyanosis or clubbing. Neuro: Does not follow commands for cranial nerve testing, but PERRL, palate elevation equal, face symmetric.  Able to move both upper extremities equally, but with 4/5 strength.  Lower extremity testing deferred, but patient able to stand with nursing.   Psych: Awake, states she is in "Apartment 2" (does not live in an apartment 2, per daughter), oriented to self.  States her daughter is "my sister".  Does not recognize me after my introduction.       Labs on Admission:  I have personally reviewed following labs and imaging studies: CBC:  Recent Labs Lab 11/18/15 2135  WBC 7.5  HGB 13.4  HCT 40.0  MCV 87.1  PLT 273   Basic Metabolic Panel:  Recent Labs Lab 11/18/15 2135  NA 139  K 4.3  CL 107  CO2 25  GLUCOSE 106*  BUN 25*  CREATININE 1.13*  CALCIUM 9.3   GFR: CrCl cannot be calculated (Unknown ideal weight.).  Liver Function Tests: No results for input(s): AST, ALT, ALKPHOS, BILITOT, PROT, ALBUMIN in the last 168 hours. No results for input(s): LIPASE, AMYLASE in the last 168 hours. No results for input(s): AMMONIA in the last 168 hours. Coagulation Profile: No results for input(s): INR, PROTIME in the last 168 hours. Cardiac Enzymes: No results for input(s): CKTOTAL, CKMB, CKMBINDEX, TROPONINI in the last 168 hours. BNP (last 3 results) No results for input(s): PROBNP in the last 8760 hours. HbA1C: No results for input(s): HGBA1C in the last 72 hours. CBG: No results for input(s): GLUCAP in the last 168 hours. Lipid Profile: No results for input(s): CHOL, HDL, LDLCALC, TRIG, CHOLHDL, LDLDIRECT in the last 72 hours. Thyroid Function Tests: No results for  input(s): TSH, T4TOTAL, FREET4, T3FREE, THYROIDAB in the last 72 hours. Anemia Panel: No results for input(s): VITAMINB12, FOLATE, FERRITIN, TIBC, IRON, RETICCTPCT in the last 72 hours. Sepsis Labs: Lactic acid 1.02 Invalid input(s): PROCALCITONIN, LACTICIDVEN No results found for this or any previous visit (from the past 240 hour(s)).       Radiological Exams on Admission: Personally reviewed CXR shows no focal opacity or pneumonia: Dg Chest 2 View  Result Date: 11/18/2015 CLINICAL DATA:  Chest pain EXAM: CHEST  2 VIEW COMPARISON:  01/06/2015 FINDINGS: The heart size and mediastinal contours are within normal limits. Both lungs are clear. The visualized skeletal structures are unremarkable. IMPRESSION: No active cardiopulmonary disease. Electronically Signed   By: Marlan Palau M.D.   On: 11/18/2015 21:35    EKG: Independently reviewed. 83, QTc 453, LVH, no ischemic changes.    Assessment/Plan  1. Acute encephalopathy:  From UTI and  dehydration (elevated BUN and slightly increased creatinine) in the setting of dementia and Parkinson's disease on Sinemet.  No new medications or dose changes recently. -PT eval -CM consult for Home health -IVF overnight    2. UTI:  -Follow culture -Cephalexin 500 mg BID  3. Parkinson's:  -Continue Sinemet BID   4. Depression:  -Continue mirtazapine and Buspar          DVT prophylaxis: Lovenox  Code Status: FULL  Family Communication: Daughter/POA at bedside.  Overnight plan discussed.  Disposition Plan: Anticipate observation overnight for PT eval and CM consult tomorrow to arrange home health.  Likely home tomorrow. Consults called: None Admission status: OBS, med surg At the point of initial evaluation, it is my clinical opinion that admission for OBSERVATION is reasonable and necessary because the patient's presenting complaints in the context of their chronic conditions represent sufficient risk of deterioration or  significant morbidity to constitute reasonable grounds for close observation in the hospital setting, but that the patient may be medically stable for discharge from the hospital within 24 to 48 hours.    Medical decision making: Patient seen at 1:34 AM on 11/19/2015.  The patient was discussed with Antony MaduraKelly Humes, PA-C.  What exists of the patient's chart was reviewed in depth and summarized above.  Clinical condition: stable.        Alberteen SamChristopher P Adylynn Hertenstein [facility] Pager [phone]

## 2015-11-19 NOTE — Evaluation (Addendum)
Physical Therapy Evaluation Patient Details Name: Susan Simmons MRN: [id number] DOB: 1934-04-10 Today's Date: 11/19/2015   History of Present Illness  81 yo female admitted with UTI, dehydration, encephalopathy, fall at home 2 days PTA. Hx of Parkinson's, anxiety, depression, dementia  Clinical Impression  On eval, pt required Mod-Max assist for mobility. She was able to perform a stand pivot x2 with a RW. She remains unsteady and at risk for falls. No family present during session. Recommend SNF.    Follow Up Recommendations SNF    Equipment Recommendations   (TBD at next venue)    Recommendations for Other Services       Precautions / Restrictions Precautions Precautions: Fall Restrictions Weight Bearing Restrictions: No      Mobility  Bed Mobility Overal bed mobility: Needs Assistance Bed Mobility: Supine to Sit;Sit to Supine     Supine to sit: Max assist Sit to supine: Mod assist   General bed mobility comments: Increased time. Assist for trunk and bil LEs. Utilized bedpad for scooting, positioning. Multimodal, repeated cues required.  Transfers Overall transfer level: Needs assistance Equipment used: Rolling walker (2 wheeled) Transfers: Sit to/from [employer] Sit to Stand: Mod assist;From elevated surface Stand pivot transfers: Mod assist       General transfer comment: Increased time. Assist to position LE, rise, stabilize, control descent. Stand pivot, bed <>bsc, with RW. Multimodal, repeated cues required.   Ambulation/Gait                Stairs            Wheelchair Mobility    Modified Rankin (Stroke Patients Only)       Balance Overall balance assessment: Needs assistance;History of Falls           Standing balance-Leahy Scale: Poor                               Pertinent Vitals/Pain Pain Assessment: Faces Faces Pain Scale: No hurt    Home Living Family/patient expects to be discharged  to:: Skilled nursing facility Living Arrangements: Children Available Help at Discharge: Family;Available 24 hours/day Type of Home: House Home Access: Stairs to enter Entrance Stairs-Rails: None Entrance Stairs-Number of Steps: 2 Home Layout: One level   Additional Comments: No family present and pt not reliable historian.    Prior Function Level of Independence: Needs assistance   Gait / Transfers Assistance Needed: pt stated she walks without an assistive device     Comments: pt reports she falls sometimes     Hand Dominance        Extremity/Trunk Assessment   Upper Extremity Assessment: Generalized weakness           Lower Extremity Assessment: Generalized weakness      Cervical / Trunk Assessment: Kyphotic  Communication   Communication: No difficulties  Cognition Arousal/Alertness: Awake/alert Behavior During Therapy: Flat affect Overall Cognitive Status: No family/caregiver present to determine baseline cognitive functioning                      General Comments      Exercises     Assessment/Plan    PT Assessment Patient needs continued PT services  PT Problem List Decreased strength;Decreased mobility;Decreased activity tolerance;Decreased balance;Decreased knowledge of use of DME          PT Treatment Interventions DME instruction;Gait training;Therapeutic activities;Therapeutic exercise;Functional mobility training;Balance training;Patient/family education    PT  Goals (Current goals can be found in the Care Plan section)  Acute Rehab PT Goals Patient Stated Goal: none stated PT Goal Formulation: Patient unable to participate in goal setting Time For Goal Achievement: 12/03/15 Potential to Achieve Goals: Good    Frequency Min 3X/week   Barriers to discharge        Co-evaluation               End of Session   Activity Tolerance: Patient tolerated treatment well Patient left: in bed;with call bell/phone within  reach;with bed alarm set      Functional Assessment Tool Used: clinical judgement Functional Limitation: Mobility: Walking and moving around Mobility: Walking and Moving Around Current Status ([id number]): At least 40 percent but less than 60 percent impaired, limited or restricted Mobility: Walking and Moving Around Goal Status [phone]([id number]): At least 20 percent but less than 40 percent impaired, limited or restricted    Time: [id number] PT Time Calculation (min) (ACUTE ONLY): 30 min   Charges:   PT Evaluation $PT Eval Low Complexity: 1 Procedure PT Treatments $Therapeutic Activity: 8-22 mins   PT G Codes:   PT G-Codes **NOT FOR INPATIENT CLASS** Functional Assessment Tool Used: clinical judgement Functional Limitation: Mobility: Walking and moving around Mobility: Walking and Moving Around Current Status ([phone](G8978): At least 40 percent but less than 60 percent impaired, limited or restricted Mobility: Walking and Moving Around Goal Status [phone](G8979): At least 20 percent but less than 40 percent impaired, limited or restricted    Rebeca AlertJannie Ninette Cotta, MPT Pager: [phone]

## 2015-11-19 NOTE — ED Notes (Signed)
Pt unable to stand and ambulate without assistance.   Pt was able to stand with one-person assist but unable to take steps even with staff assistance.  Pt's balance very unsteady.

## 2015-11-19 NOTE — Progress Notes (Signed)
Spoke with patient's daughter [name], was upadated with the mom's condition.

## 2015-11-20 DIAGNOSIS — G20 Parkinson's disease: Secondary | ICD-10-CM | POA: Diagnosis not present

## 2015-11-20 DIAGNOSIS — N3000 Acute cystitis without hematuria: Secondary | ICD-10-CM | POA: Diagnosis not present

## 2015-11-20 DIAGNOSIS — G9340 Encephalopathy, unspecified: Secondary | ICD-10-CM | POA: Diagnosis not present

## 2015-11-20 LAB — BASIC METABOLIC PANEL
ANION GAP: 4 — AB (ref 5–15)
BUN: 17 mg/dL (ref 6–20)
BUN: 17 mg/dL (ref 4–21)
CHLORIDE: 112 mmol/L — AB (ref 101–111)
CO2: 25 mmol/L (ref 22–32)
Calcium: 8.2 mg/dL — ABNORMAL LOW (ref 8.9–10.3)
Creatinine, Ser: 0.99 mg/dL (ref 0.44–1.00)
Creatinine: 1 mg/dL (ref 0.5–1.1)
GFR calc Af Amer: >60 mL/min (ref >60)
GFR, EST NON AFRICAN AMERICAN: 53 mL/min — AB (ref >60)
GLUCOSE: 89 mg/dL
GLUCOSE: 89 mg/dL (ref 65–99)
POTASSIUM: 3.9 mmol/L (ref 3.5–5.1)
SODIUM: 141 mmol/L (ref 137–147)
Sodium: 141 mmol/L (ref 135–145)

## 2015-11-20 MED ORDER — CEPHALEXIN 500 MG PO CAPS
500.0000 mg | ORAL_CAPSULE | Freq: Two times a day (BID) | ORAL | 0 refills | Status: DC
Start: 2015-11-20 — End: 2015-12-19

## 2015-11-20 NOTE — Clinical Social Work Placement (Signed)
   CLINICAL SOCIAL WORK PLACEMENT  NOTE  Date:  11/20/2015  Patient Details  Name: Susan Simmons MRN: [id number] Date of Birth: 03/29/1934  Clinical Social Work is seeking post-discharge placement for this patient at the Skilled  Nursing Facility level of care (*CSW will initial, date and re-position this form in  chart as items are completed):  Yes   Patient/family provided with [facility] Clinical Social Work Department's list of facilities offering this level of care within the geographic area requested by the patient (or if unable, by the patient's family).  Yes   Patient/family informed of their freedom to choose among providers that offer the needed level of care, that participate in Medicare, Medicaid or managed care program needed by the patient, have an available bed and are willing to accept the patient.  Yes   Patient/family informed of [facility]'s ownership interest in [facility] and [facility], as well as of the fact that they are under no obligation to receive care at these facilities.  PASRR submitted to EDS on       PASRR number received on       Existing PASRR number confirmed on 11/20/15     FL2 transmitted to all facilities in geographic area requested by pt/family on 11/20/15     FL2 transmitted to all facilities within larger geographic area on       Patient informed that his/her managed care company has contracts with or will negotiate with certain facilities, including the following:        Yes   Patient/family informed of bed offers received.  Patient [name]     Physician recommends and patient chooses bed at      Patient to be [name] on 11/20/15.  Patient to be transferred to facility by [facility]     Patient family notified on 11/20/15 of transfer.  Name of family member notified:  [name], [name]     PHYSICIAN       Additional Comment:     _______________________________________________ Dede QuerySarah Ardon Franklin, LCSW 11/20/2015, 2:57 PM

## 2015-11-20 NOTE — Progress Notes (Signed)
Patient discharged to SNF,  copies of all discharge medications and instructions sent to facility and report called to receiving RN.  Pt to be transported via [facility].

## 2015-11-20 NOTE — NC FL2 (Signed)
[place] MEDICAID FL2 LEVEL OF CARE SCREENING TOOL     IDENTIFICATION  Patient Name: Susan GasserShirley Doell Birthdate: 03/07/1934 Sex: female Admission Date (Current Location): 11/18/2015  [facility] [place]   Facility and Address:  [facility], [address], [place], [place] [address]      Provider Number: [id number]  Attending Physician Name and Address:  Kathlen ModyVijaya Akula, MD  Relative Name and Phone Number:       Current Level of Care: Hospital Recommended Level of Care: Skilled Nursing Facility Prior Approval Number:    Date Approved/Denied:   PASRR Number: [id number] [id number]  Discharge Plan: SNF    Current Diagnoses: Patient Active Problem List   Diagnosis Date Noted  . UTI (urinary tract infection) 02/05/2015  . Parkinsonism (HCC) 01/25/2015  . Abnormality of gait   . Leukopenia 01/07/2015  . Anxiety 11/12/2014  . Malnutrition of moderate degree 11/10/2014  . Pressure ulcer stage II 11/09/2014  . Acute encephalopathy 08/02/2014  . Depression 05/02/2014    Orientation RESPIRATION BLADDER Height & Weight     Self  Normal Incontinent Weight: 117 lb 1.6 oz (53.1 kg) Height:  5\' 2"  (157.5 cm)  BEHAVIORAL SYMPTOMS/MOOD NEUROLOGICAL BOWEL NUTRITION STATUS      Continent Diet (Regular Diet)  AMBULATORY STATUS COMMUNICATION OF NEEDS Skin   Extensive Assist Verbally Normal                       Personal Care Assistance Level of Assistance  Bathing, Feeding, Dressing Bathing Assistance: Limited assistance Feeding assistance: Independent Dressing Assistance: Limited assistance     Functional Limitations Info  Sight, Hearing, Speech Sight Info: Adequate Hearing Info: Adequate Speech Info: Adequate    SPECIAL CARE FACTORS FREQUENCY  PT (By licensed PT)     PT Frequency: 5              Contractures Contractures Info: Not present    Additional Factors Info  Code Status, Allergies, Psychotropic Code Status  Info: Full Code Allergies Info: No known allergies Psychotropic Info: Medications         Current Medications (11/20/2015):  This is the current hospital active medication list Current Facility-Administered Medications  Medication Dose Route Frequency Provider Last Rate Last Dose  . acetaminophen (TYLENOL) tablet 650 mg  650 mg Oral Q6H PRN Alberteen Samhristopher P Danford, MD       Or  . acetaminophen (TYLENOL) suppository 650 mg  650 mg Rectal Q6H PRN Alberteen Samhristopher P Danford, MD      . busPIRone (BUSPAR) tablet 15 mg  15 mg Oral BID Alberteen Samhristopher P Danford, MD   15 mg at 11/20/15 0840  . carbidopa-levodopa (SINEMET IR) 25-100 MG per tablet immediate release 1 tablet  1 tablet Oral BID Alberteen Samhristopher P Danford, MD   1 tablet at 11/20/15 0840  . cephALEXin (KEFLEX) capsule 500 mg  500 mg Oral Q12H Alberteen Samhristopher P Danford, MD   500 mg at 11/20/15 0840  . enoxaparin (LOVENOX) injection 40 mg  40 mg Subcutaneous Q24H Alberteen Samhristopher P Danford, MD   40 mg at 11/20/15 0840  . feeding supplement (ENSURE ENLIVE) (ENSURE ENLIVE) liquid 237 mL  237 mL Oral BID PRN Alberteen Samhristopher P Danford, MD      . mirtazapine (REMERON) tablet 15 mg  15 mg Oral QHS Alberteen Samhristopher P Danford, MD   15 mg at 11/19/15 2141  . ondansetron (ZOFRAN) tablet 4 mg  4 mg Oral Q6H PRN Alberteen Samhristopher P Danford,  MD   4 mg at 11/19/15 2141   Or  . ondansetron (ZOFRAN) injection 4 mg  4 mg Intravenous Q6H PRN Alberteen Sam, MD         Discharge Medications: Please see discharge summary for a list of discharge medications.  Relevant Imaging Results:  Relevant Lab Results:   Additional Information SSN:  [id number]  Dede Query, LCSW

## 2015-11-20 NOTE — Discharge Summary (Signed)
Physician Discharge Summary  Susan Simmons [id number] DOB: 12-06-1934 DOA: 11/18/2015  PCP: Clayborn Heron, MD  Admit date: 11/18/2015 Discharge date: 11/20/2015  Admitted From: HOME Disposition: SNF  Recommendations for Outpatient Follow-up:  1. Follow up with PCP in 1-2 weeks 2. Please obtain BMP/CBC in one week 3. Please follow up on THE URINE CULTURE REPORT    Discharge Condition: stable.  CODE STATUS: full.  Diet recommendation: regular.   Brief/Interim Summary: Susan Simmons is a 81 y.o. female with a past medical history significant for Parkinson's disease followed by Dr. Lucia Gaskins and dementia who presents with 3 days progressive weakness,  was found to have a UTI, started on rocephin and transitioned to oral keflex.   Discharge Diagnoses:  Principal Problem:   UTI (urinary tract infection) Active Problems:   Acute encephalopathy   Parkinsonism (HCC)   1. Acute encephalopathy:  From UTI and dehydration (elevated BUN and slightly increased creatinine) in the setting of dementia and Parkinson's disease on Sinemet.  gentle hydration. Encephalopathy resolved.   2. UTI:  -Follow culture -Cephalexin 500 mg BID  3. Parkinson's:  -Continue Sinemet BID   4. Depression:  -Continue mirtazapine and Buspar   Discharge Instructions  Discharge Instructions    Diet general    Complete by:  As directed    Discharge instructions    Complete by:  As directed    FOLLOW up with PCP in one week and follow the urine culture report.       Medication List    TAKE these medications   busPIRone 15 MG tablet Commonly known as:  BUSPAR Take 15 mg by mouth 2 (two) times daily.   carbidopa-levodopa 25-100 MG tablet Commonly known as:  SINEMET IR Take 1 tablet by mouth 3 (three) times daily. What changed:  when to take this   cephALEXin 500 MG capsule Commonly known as:  KEFLEX Take 1 capsule (500 mg total) by mouth every 12 (twelve) hours.   feeding  supplement (ENSURE ENLIVE) Liqd Take 237 mLs by mouth 2 (two) times daily between meals. What changed:  when to take this  reasons to take this   mirtazapine 15 MG tablet Commonly known as:  REMERON Take 15 mg by mouth at bedtime.       No Known Allergies  Consultations:  none   Procedures/Studies: Dg Chest 2 View  Result Date: 11/18/2015 CLINICAL DATA:  Chest pain EXAM: CHEST  2 VIEW COMPARISON:  01/06/2015 FINDINGS: The heart size and mediastinal contours are within normal limits. Both lungs are clear. The visualized skeletal structures are unremarkable. IMPRESSION: No active cardiopulmonary disease. Electronically Signed   By: Marlan Palau M.D.   On: 11/18/2015 21:35      Subjective: No new complaints.   Discharge Exam: Vitals:   11/19/15 2108 11/20/15 0518  BP: (!) 108/43 (!) 102/56  Pulse: 89 68  Resp: 16 16  Temp: 99.3 F (37.4 C) 98.2 F (36.8 C)   Vitals:   11/19/15 0637 11/19/15 1509 11/19/15 2108 11/20/15 0518  BP: (!) 124/52 (!) 108/43 (!) 108/43 (!) 102/56  Pulse: 80 80 89 68  Resp: 16 16 16 16   Temp: 98.8 F (37.1 C) 98.4 F (36.9 C) 99.3 F (37.4 C) 98.2 F (36.8 C)  TempSrc: Oral Oral Oral Oral  SpO2: 99% 100% 97% 98%  Weight:      Height:        General: Pt is alert, awake, not in acute distress Cardiovascular: RRR, S1/S2 +,  no rubs, no gallops Respiratory: CTA bilaterally, no wheezing, no rhonchi Abdominal: Soft, NT, ND, bowel sounds + Extremities: no edema, no cyanosis    The results of significant diagnostics from this hospitalization (including imaging, microbiology, ancillary and laboratory) are listed below for reference.     Microbiology: No results found for this or any previous visit (from the past 240 hour(s)).   Labs: BNP (last 3 results) No results for input(s): BNP in the last 8760 hours. Basic Metabolic Panel:  Recent Labs Lab 11/18/15 2135 11/20/15 0351  NA 139 141  K 4.3 3.9  CL 107 112*  CO2 25 25   GLUCOSE 106* 89  BUN 25* 17  CREATININE 1.13* 0.99  CALCIUM 9.3 8.2*   Liver Function Tests: No results for input(s): AST, ALT, ALKPHOS, BILITOT, PROT, ALBUMIN in the last 168 hours. No results for input(s): LIPASE, AMYLASE in the last 168 hours. No results for input(s): AMMONIA in the last 168 hours. CBC:  Recent Labs Lab 11/18/15 2135  WBC 7.5  HGB 13.4  HCT 40.0  MCV 87.1  PLT 273   Cardiac Enzymes: No results for input(s): CKTOTAL, CKMB, CKMBINDEX, TROPONINI in the last 168 hours. BNP: Invalid input(s): POCBNP CBG: No results for input(s): GLUCAP in the last 168 hours. D-Dimer No results for input(s): DDIMER in the last 72 hours. Hgb A1c No results for input(s): HGBA1C in the last 72 hours. Lipid Profile No results for input(s): CHOL, HDL, LDLCALC, TRIG, CHOLHDL, LDLDIRECT in the last 72 hours. Thyroid function studies No results for input(s): TSH, T4TOTAL, T3FREE, THYROIDAB in the last 72 hours.  Invalid input(s): FREET3 Anemia work up No results for input(s): VITAMINB12, FOLATE, FERRITIN, TIBC, IRON, RETICCTPCT in the last 72 hours. Urinalysis    Component Value Date/Time   COLORURINE YELLOW 11/18/2015 2308   APPEARANCEUR CLEAR 11/18/2015 2308   LABSPEC 1.024 11/18/2015 2308   PHURINE 6.0 11/18/2015 2308   GLUCOSEU NEGATIVE 11/18/2015 2308   HGBUR NEGATIVE 11/18/2015 2308   BILIRUBINUR NEGATIVE 11/18/2015 2308   KETONESUR NEGATIVE 11/18/2015 2308   PROTEINUR NEGATIVE 11/18/2015 2308   UROBILINOGEN 1.0 11/09/2014 0759   NITRITE POSITIVE (A) 11/18/2015 2308   LEUKOCYTESUR TRACE (A) 11/18/2015 2308   Sepsis Labs Invalid input(s): PROCALCITONIN,  WBC,  LACTICIDVEN Microbiology No results found for this or any previous visit (from the past 240 hour(s)).   Time coordinating discharge: Over 30 minutes  SIGNED:   Kathlen ModyAKULA,Veretta Sabourin, MD  [facility] 11/20/2015, 11:41 AM Pager   If 7PM-7AM, please contact night-coverage [email or web address] Password  [email or web address]

## 2015-11-20 NOTE — Clinical Social Work Note (Signed)
Pt is ready for discharge today to [facility]. Pt and pt's daughter are aware and agreeable to discharge plan. Facility is ready at admit pt as the facility has received discharge information. RN will call report. [facility] will provide transportation. CSW is signing off as no further needs identified.   Dede QuerySarah Raekwon Winkowski, MSW, LCSW  Clinical Social Worker  [phone]

## 2015-11-20 NOTE — Clinical Social Work Placement (Signed)
   CLINICAL SOCIAL WORK PLACEMENT  NOTE  Date:  11/20/2015  Patient Details  Name: Susan Simmons MRN: [id number] Date of Birth: 08-08-1934  Clinical Social Work is seeking post-discharge placement for this patient at the Skilled  Nursing Facility level of care (*CSW will initial, date and re-position this form in  chart as items are completed):  Yes   Patient/family provided with [place] Clinical Social Work Department's list of facilities offering this level of care within the geographic area requested by the patient (or if unable, by the patient's family).  Yes   Patient/family informed of their freedom to choose among providers that offer the needed level of care, that participate in Medicare, Medicaid or managed care program needed by the patient, have an available bed and are willing to accept the patient.  Yes   Patient/family informed of [facility]'s ownership interest in [facility] and [facility], as well as of the fact that they are under no obligation to receive care at these facilities.  PASRR submitted to EDS on       PASRR number received on       Existing PASRR number confirmed on 11/20/15     FL2 transmitted to all facilities in geographic area requested by pt/family on 11/20/15     FL2 transmitted to all facilities within larger geographic area on       Patient informed that his/her managed care company has contracts with or will negotiate with certain facilities, including the following:            Patient/family informed of bed offers received.  Patient chooses bed at       Physician recommends and patient chooses bed at      Patient to be transferred to   on  .  Patient to be transferred to facility by       Patient family notified on   of transfer.  Name of family member notified:        PHYSICIAN       Additional Comment:    _______________________________________________ Dede QuerySarah Corona Popovich, LCSW 11/20/2015, 9:24 AM

## 2015-11-20 NOTE — Clinical Social Work Note (Signed)
Clinical Social Work Assessment  Patient Details  Name: Susan Simmons MRN: [id number] Date of Birth: March 06, 1934  Date of referral:  11/20/15               Reason for consult:  Facility Placement, Discharge Planning                Permission sought to share information with:  Family Supports Permission granted to share information::  Yes, Verbal Permission Granted  Name::     Fenton Foy  Relationship::  daughter  Contact Information:  [phone]  Housing/Transportation Living arrangements for the past 2 months:  Apartment Source of Information:  Patient, Adult Children Patient Interpreter Needed:  None Criminal Activity/Legal Involvement Pertinent to Current Situation/Hospitalization:  No - Comment as needed Significant Relationships:  Adult Children Lives with:  Adult Children Do you feel safe going back to the place where you live?  Yes Need for family participation in patient care:  Yes (Comment)  Care giving concerns:  No care giving concerns   Social Worker assessment / plan:  CSW met with pt to address consult. Pt is only oriented to self, however is pleasant. CSW shared that she will be speaking to her daughter. CSW called pt's daughter. CSW introduced herself and explained role of social work. CSW also explained the process of discharging to SNF. [name]'s preference is [facility]. CSW initiated SNF search and left a message for [facility]. CSW will attempt to reach them again to facilitate discharge. CSW also updated RNCM. CSW will continue to follow.   Employment status:  Retired Nurse, adult PT Recommendations:  [place] / Referral to community resources:  [place]  Patient/Family's Response to care:  [name] was Patent attorney of CSW support.   Patient/Family's Understanding of and Emotional Response to Diagnosis, Current Treatment, and Prognosis:  [name] understands that pt  will benefit from STR at [facility].  Emotional Assessment Appearance:  Appears stated age Attitude/Demeanor/Rapport:   (Appropriate) Affect (typically observed):  Pleasant Orientation:  Oriented to Self Alcohol / Substance use:  Never Used Psych involvement (Current and /or in the community):  No (Comment)  Discharge Needs  Concerns to be addressed:  Adjustment to Illness Readmission within the last 30 days:  No Current discharge risk:  Chronically ill Barriers to Discharge:  No Barriers Identified   Darden Dates, LCSW 11/20/2015, 9:26 AM

## 2015-11-21 ENCOUNTER — Encounter: Payer: Self-pay | Admitting: Internal Medicine

## 2015-11-21 ENCOUNTER — Non-Acute Institutional Stay (SKILLED_NURSING_FACILITY): Payer: Medicare Other | Admitting: Internal Medicine

## 2015-11-21 DIAGNOSIS — E860 Dehydration: Secondary | ICD-10-CM

## 2015-11-21 DIAGNOSIS — N179 Acute kidney failure, unspecified: Secondary | ICD-10-CM | POA: Diagnosis not present

## 2015-11-21 DIAGNOSIS — G9340 Encephalopathy, unspecified: Secondary | ICD-10-CM

## 2015-11-21 DIAGNOSIS — F329 Major depressive disorder, single episode, unspecified: Secondary | ICD-10-CM | POA: Diagnosis not present

## 2015-11-21 DIAGNOSIS — N3000 Acute cystitis without hematuria: Secondary | ICD-10-CM | POA: Diagnosis not present

## 2015-11-21 DIAGNOSIS — F32A Depression, unspecified: Secondary | ICD-10-CM

## 2015-11-21 DIAGNOSIS — F419 Anxiety disorder, unspecified: Secondary | ICD-10-CM

## 2015-11-21 LAB — URINE CULTURE: Culture: >=100000 — AB

## 2015-11-21 NOTE — Progress Notes (Signed)
: Provider:  Randon Goldsmith. Lyn Hollingshead, MD Location:  [facility] Room Number: 101P Place of Service:  SNF [phone])  PCP: Clayborn Heron, MD Patient Care Team: Clayborn Heron, MD as PCP - General [facility])  Extended Emergency Contact Information Primary Emergency Contact: Adams,[name] Address: [address]           [place], [place] [address] [place] Home Phone: [phone] Relation: Daughter     Allergies: Review of patient's allergies indicates no known allergies.  Chief Complaint  Patient presents with  . New Admit To SNF    Admit to SNF    HPI: Patient is 81 y.o. female with Parkinson's disease and dementiawho presented with 3 days progressive weakness and inc confusion and lethargy  to ED. Pt was admitted to [facility] from 10/13-15 where she was dx with encephalopathy 2/2 acute UTI and dehydration with AKI, corrected with IVF and Keflex for UTI. Pt is admitted to SNF with generakized weakness for OT/PT. While at SNF pt will be followed for Parkisnson's dx, tx with simemet, depresion, tx with remeron and anxiety, tx with buspar.  Past Medical History:  Diagnosis Date  . Anxiety   . Depression   . Heart murmur   . Parkinson disease [facility])     Past Surgical History:  Procedure Laterality Date  . denies surgical history        Medication List       Accurate as of 11/21/15 10:26 AM. Always use your most recent med list.          busPIRone 15 MG tablet Commonly known as:  BUSPAR Take 15 mg by mouth 2 (two) times daily.   carbidopa-levodopa 25-100 MG tablet Commonly known as:  SINEMET IR Take 1 tablet by mouth 3 (three) times daily.   cephALEXin 500 MG capsule Commonly known as:  KEFLEX Take 1 capsule (500 mg total) by mouth every 12 (twelve) hours.   feeding supplement (ENSURE ENLIVE) Liqd Take 237 mLs by mouth 2 (two) times daily between meals.   mirtazapine 15 MG tablet Commonly known as:  REMERON Take  15 mg by mouth at bedtime.       No orders of the defined types were placed in this encounter.   Immunization History  Administered Date(s) Administered  . Influenza,inj,Quad PF,36+ Mos 11/20/2015  . Pneumococcal Polysaccharide-23 11/20/2015    Social History  Substance Use Topics  . Smoking status: Former Smoker    Quit date: 03/04/1996  . Smokeless tobacco: Never Used  . Alcohol use No    Family history is UTO 2/2 dementia  Family History  Problem Relation Age of Onset  . Dementia Neg Hx   . Parkinsonism Neg Hx       Review of Systems  DATA OBTAINED: from nurse GENERAL:  no fevers, fatigue, appetite changes SKIN: No itching, or rash EYES: No eye pain, redness, discharge EARS: No earache, tinnitus, change in hearing NOSE: No congestion, drainage or bleeding  MOUTH/THROAT: No mouth or tooth pain, No sore throat RESPIRATORY: No cough, wheezing, SOB CARDIAC: No chest pain, palpitations, lower extremity edema  GI: No abdominal pain, No N/V/D or constipation, No heartburn or reflux  GU: No dysuria, frequency or urgency, or incontinence  MUSCULOSKELETAL: No unrelieved bone/joint pain NEUROLOGIC: No headache, dizziness or focal weakness PSYCHIATRIC: No c/o anxiety or sadness   Vitals:   11/21/15 0846  BP: 138/69  Pulse: 99  Resp: 18  Temp: 97.9 F (36.6 C)    SpO2 Readings from Last 1 Encounters:  11/20/15 100%   Body mass index is 21 kg/m.     Physical Exam  GENERAL APPEARANCE: Alert,  No acute distress.  SKIN: No diaphoresis rash HEAD: Normocephalic, atraumatic  EYES: Conjunctiva/lids clear. Pupils round, reactive. EOMs intact.  EARS: External exam WNL, canals clear. Hearing grossly normal.  NOSE: No deformity or discharge.  MOUTH/THROAT: Lips w/o lesions  RESPIRATORY: Breathing is even, unlabored. Lung sounds are clear   CARDIOVASCULAR: Heart RRR no murmurs, rubs or gallops. No peripheral edema.   GASTROINTESTINAL: Abdomen is soft, non-tender,  not distended w/ normal bowel sounds. GENITOURINARY: Bladder non tender, not distended  MUSCULOSKELETAL: No abnormal joints or musculature NEUROLOGIC:  Cranial nerves 2-12 grossly intact. Moves all extremities  PSYCHIATRIC: Mood and affect appropriate to situation with dementia, no behavioral issues  Patient Active Problem List   Diagnosis Date Noted  . UTI (urinary tract infection) 02/05/2015  . Parkinsonism (HCC) 01/25/2015  . Abnormality of gait   . Leukopenia 01/07/2015  . Anxiety 11/12/2014  . Malnutrition of moderate degree 11/10/2014  . Pressure ulcer stage II 11/09/2014  . Acute encephalopathy 08/02/2014  . Depression 05/02/2014      Labs reviewed: Basic Metabolic Panel:    Component Value Date/Time   NA 141 11/20/2015 0351   NA 141 11/20/2015   K 3.9 11/20/2015 0351   CL 112 (H) 11/20/2015 0351   CO2 25 11/20/2015 0351   GLUCOSE 89 11/20/2015 0351   BUN 17 11/20/2015 0351   BUN 17 11/20/2015   CREATININE 0.99 11/20/2015 0351   CALCIUM 8.2 (L) 11/20/2015 0351   PROT 6.7 01/06/2015 1804   ALBUMIN 3.1 (L) 01/06/2015 1804   AST 25 01/06/2015 1804   ALT 12 (L) 01/06/2015 1804   ALKPHOS 71 01/06/2015 1804   BILITOT 0.5 01/06/2015 1804   GFRNONAA 53 (L) 11/20/2015 0351   GFRAA >60 11/20/2015 0351     Recent Labs  02/04/15 1322  11/18/15 11/18/15 2135 11/20/15 11/20/15 0351  NA 141  --  139 139 141 141  K 5.6*  < > 4.3 4.3  --  3.9  CL 105  --   --  107  --  112*  CO2 30  --   --  25  --  25  GLUCOSE 98  --   --  106*  --  89  BUN 18  --  25* 25* 17 17  CREATININE 1.05*  < > 1.1 1.13* 1.0 0.99  CALCIUM 9.2  --   --  9.3  --  8.2*  < > = values in this interval not displayed. Liver Function Tests:  Recent Labs  01/06/15 1804  AST 25  ALT 12*  ALKPHOS 71  BILITOT 0.5  PROT 6.7  ALBUMIN 3.1*   No results for input(s): LIPASE, AMYLASE in the last 8760 hours.  Recent Labs  01/06/15 2001  AMMONIA 22   CBC:  Recent Labs  01/06/15 1804   02/04/15 1417 02/05/15 0104 11/18/15 11/18/15 2135  WBC 3.6*  < > 6.5 3.9* 7.5 7.5  NEUTROABS 0.9*  --  2.7  --   --   --   HGB 12.2  < > 13.0 11.5*  --  13.4  HCT 38.9  < > 41.6 36.1  --  40.0  MCV 89.0  < > 89.1 88.0  --  87.1  PLT 325  < > 283 240  --  273  < > = values in this interval not displayed. Lipid  Recent Labs  01/07/15 0808  CHOL 162  HDL 51  LDLCALC 101*  TRIG 50    Cardiac Enzymes:  Recent Labs  01/06/15 1742  CKTOTAL 110   BNP: No results for input(s): BNP in the last 8760 hours. No results found for: [facility] Lab Results  Component Value Date   HGBA1C 5.5 01/07/2015   Lab Results  Component Value Date   TSH 1.682 01/06/2015   Lab Results  Component Value Date   VITAMINB12 453 08/02/2014   Lab Results  Component Value Date   FOLATE 17.2 08/02/2014   No results found for: IRON, TIBC, FERRITIN  Imaging and Procedures obtained prior to SNF admission: Dg Chest 2 View  Result Date: 11/18/2015 CLINICAL DATA:  Chest pain EXAM: CHEST  2 VIEW COMPARISON:  01/06/2015 FINDINGS: The heart size and mediastinal contours are within normal limits. Both lungs are clear. The visualized skeletal structures are unremarkable. IMPRESSION: No active cardiopulmonary disease. Electronically Signed   By: Marlan Palau M.D.   On: 11/18/2015 21:35     Not all labs, radiology exams or other studies done during hospitalization come through on my EPIC note; however they are reviewed by me.    Assessment and Plan  ACUTE ENCEPHALOPATHY/AKI/ DEHYDRATION - 2/2 UTI; tx with IVF; d/c Cr 0.99 SNF - will f/u BMP  UTI- tx with Keflex 500 mg BID SNF - cont keflex 500 mg BID for 7 days in absence of info otherwise  PARKINSON'S DX-  SNF - not stated as uncontrolled; cont sinemet 25-100 TID  DEPRESSION SNF - not stated as uncontrolled;plan to cont remeron 15 mg qHS  ANXIETY SNF - not stated as uncontrolled; cont buspar 15 mg BID   Time spent > 45 min;> 50% of  time with patient was spent reviewing records, labs, tests and studies, counseling and developing plan of care  Thurston Hole D. Lyn Hollingshead, MD

## 2015-11-22 DIAGNOSIS — N179 Acute kidney failure, unspecified: Secondary | ICD-10-CM | POA: Insufficient documentation

## 2015-11-22 DIAGNOSIS — E860 Dehydration: Secondary | ICD-10-CM | POA: Insufficient documentation

## 2015-12-19 ENCOUNTER — Non-Acute Institutional Stay (SKILLED_NURSING_FACILITY): Payer: Medicare Other | Admitting: Internal Medicine

## 2015-12-19 ENCOUNTER — Encounter: Payer: Self-pay | Admitting: Internal Medicine

## 2015-12-19 DIAGNOSIS — F0281 Dementia in other diseases classified elsewhere with behavioral disturbance: Secondary | ICD-10-CM | POA: Diagnosis not present

## 2015-12-19 DIAGNOSIS — G9340 Encephalopathy, unspecified: Secondary | ICD-10-CM

## 2015-12-19 DIAGNOSIS — N179 Acute kidney failure, unspecified: Secondary | ICD-10-CM

## 2015-12-19 DIAGNOSIS — F29 Unspecified psychosis not due to a substance or known physiological condition: Secondary | ICD-10-CM | POA: Diagnosis not present

## 2015-12-19 DIAGNOSIS — N3000 Acute cystitis without hematuria: Secondary | ICD-10-CM | POA: Diagnosis not present

## 2015-12-19 DIAGNOSIS — F329 Major depressive disorder, single episode, unspecified: Secondary | ICD-10-CM

## 2015-12-19 DIAGNOSIS — E860 Dehydration: Secondary | ICD-10-CM

## 2015-12-19 DIAGNOSIS — F02818 Dementia in other diseases classified elsewhere, unspecified severity, with other behavioral disturbance: Secondary | ICD-10-CM

## 2015-12-19 DIAGNOSIS — F32A Depression, unspecified: Secondary | ICD-10-CM

## 2015-12-19 DIAGNOSIS — G20 Parkinson's disease: Secondary | ICD-10-CM

## 2015-12-19 DIAGNOSIS — F0280 Dementia in other diseases classified elsewhere without behavioral disturbance: Secondary | ICD-10-CM | POA: Insufficient documentation

## 2015-12-19 NOTE — Progress Notes (Signed)
[facility] Room Number: 101P Place of Service:  SNF (31)  Randon Goldsmithnne D. Lyn HollingsheadAlexander, MD  PCP: Clayborn HeronVictoria R Rankins, MD Patient Care Team: Clayborn HeronVictoria R Rankins, MD as PCP - [facility])  Extended Emergency Contact Information Primary Emergency Contact: Adams,[name] Address: [address]           [place], [place] [id number] [place] Home Phone: [phone] Relation: Daughter  No Known Allergies  Chief Complaint  Patient presents with  . Discharge Note    Discharged from SNF    HPI:  81 y.o. female with Parkinson's disease and dementiawho presented with 3 days progressive weakness and inc confusion and lethargy  to ED. [facility] where she was dx with encephalopathy 2/2 acute UTI and dehydration with AKI, corrected with IVF and Keflex for UTI. Pt was admitted to SNF with generakized weakness for OT/PT and is now ready to be d/c to home.     Past Medical History:  Diagnosis Date  . Anxiety   . Depression   . Heart murmur   . Parkinson disease [facility](HCC)     Past Surgical History:  Procedure Laterality Date  . denies surgical history       reports that she quit smoking about 19 years ago. She has never used smokeless tobacco. She reports that she does not drink alcohol or use drugs. Social History   Social History  . Marital status: Divorced    Spouse name: N/A  . Number of children: 4  . Years of education: 7311   Occupational History  . Not on file.   Social History Main Topics  . Smoking status: Former Smoker    Quit date: 03/04/1996  . Smokeless tobacco: Never Used  . Alcohol use No  . Drug use: No  . Sexual activity: Not on file   Other Topics Concern  . Not on file   Social History Narrative   Lives at home with daughter   Caffeine use: none    Pertinent  Health Maintenance Due  Topic Date Due  . DEXA SCAN  09/18/2001  . PNA vac Low Risk Adult (2 of 2 - PCV13)  11/19/2016  . INFLUENZA VACCINE  Completed    Medications:   Medication List       Accurate as of 12/19/15 12:23 PM. Always use your most recent med list.          busPIRone 15 MG tablet Commonly known as:  BUSPAR Take 15 mg by mouth 2 (two) times daily.   carbidopa-levodopa 25-100 MG tablet Commonly known as:  SINEMET IR Take 1 tablet by mouth 3 (three) times daily.   feeding supplement (ENSURE ENLIVE) Liqd Take 237 mLs by mouth 2 (two) times daily between meals.   mirtazapine 15 MG tablet Commonly known as:  REMERON Take 15 mg by mouth at bedtime.   risperiDONE 0.25 MG tablet Commonly known as:  RISPERDAL Take 0.25 mg by mouth daily.        Vitals:   12/19/15 0928  BP: (!) 99/53  Pulse: 63  Resp: 18  Temp: 98.1 F (36.7 C)  Weight: 116 lb 3.2 oz (52.7 kg)  Height: 5\' 2"  (1.575 m)   Body mass index is 21.25 kg/m.  Physical Exam  GENERAL APPEARANCE: Alert, conversant. No acute distress.  HEENT: Unremarkable. RESPIRATORY: Breathing is even, unlabored. Lung sounds are clear   CARDIOVASCULAR: Heart RRR no  murmurs, rubs or gallops. No peripheral edema.  GASTROINTESTINAL: Abdomen is soft, non-tender, not distended w/ normal bowel sounds.  NEUROLOGIC: Cranial nerves 2-12 grossly intact. Moves all extremities   Labs reviewed: Basic Metabolic Panel:  Recent Labs  69/62/9512/30/16 1322  11/18/15 11/18/15 2135 11/20/15 11/20/15 0351  NA 141  --  139 139 141 141  K 5.6*  < > 4.3 4.3  --  3.9  CL 105  --   --  107  --  112*  CO2 30  --   --  25  --  25  GLUCOSE 98  --   --  106*  --  89  BUN 18  --  25* 25* 17 17  CREATININE 1.05*  < > 1.1 1.13* 1.0 0.99  CALCIUM 9.2  --   --  9.3  --  8.2*  < > = values in this interval not displayed. No results found for: [facility] Liver Function Tests:  Recent Labs  01/06/15 1804  AST 25  ALT 12*  ALKPHOS 71  BILITOT 0.5  PROT 6.7  ALBUMIN 3.1*   No results for input(s): LIPASE, AMYLASE in the last 8760  hours.  Recent Labs  01/06/15 2001  AMMONIA 22   CBC:  Recent Labs  01/06/15 1804  02/04/15 1417 02/05/15 0104 11/18/15 11/18/15 2135  WBC 3.6*  < > 6.5 3.9* 7.5 7.5  NEUTROABS 0.9*  --  2.7  --   --   --   HGB 12.2  < > 13.0 11.5*  --  13.4  HCT 38.9  < > 41.6 36.1  --  40.0  MCV 89.0  < > 89.1 88.0  --  87.1  PLT 325  < > 283 240  --  273  < > = values in this interval not displayed. Lipid  Recent Labs  01/07/15 0808  CHOL 162  HDL 51  LDLCALC 101*  TRIG 50   Cardiac Enzymes:  Recent Labs  01/06/15 1742  CKTOTAL 110   BNP: No results for input(s): BNP in the last 8760 hours. CBG:  Recent Labs  01/06/15 1732  GLUCAP 69    Procedures and Imaging Studies During Stay: No results found.  Assessment/Plan:   Acute cystitis without hematuria  AKI (acute kidney injury) (HCC)  Dehydration  Acute encephalopathy  Parkinson's disease (HCC)  Dementia due to Parkinson's disease with behavioral disturbance (HCC)  Psychosis, unspecified psychosis type  Depression, unspecified depression type   Patient is being discharged with the following home health services:  HH/OT/PT  Patient is being discharged with the following durable medical equipment:  Wheelchair  Patient has been advised to f/u with their PCP in 1-2 weeks to bring them up to date on their rehab stay.  Social services at facility was responsible for arranging this appointment.  Pt was provided with a 30 day supply of prescriptions for medications and refills must be obtained from their PCP.  For controlled substances, a more limited supply may be provided adequate until PCP appointment only.  Time spent > 30 min;> 50% of time with patient was spent reviewing records, labs, tests and studies, counseling and developing plan of care   Randon Goldsmithnne D. Lyn HollingsheadAlexander, MD

## 2015-12-21 DIAGNOSIS — F0390 Unspecified dementia without behavioral disturbance: Secondary | ICD-10-CM | POA: Diagnosis not present

## 2015-12-21 DIAGNOSIS — N390 Urinary tract infection, site not specified: Secondary | ICD-10-CM | POA: Diagnosis not present

## 2015-12-21 DIAGNOSIS — G20 Parkinson's disease: Secondary | ICD-10-CM | POA: Diagnosis not present

## 2015-12-21 DIAGNOSIS — R1310 Dysphagia, unspecified: Secondary | ICD-10-CM | POA: Diagnosis not present

## 2016-01-31 ENCOUNTER — Other Ambulatory Visit: Payer: Self-pay | Admitting: Neurology

## 2016-02-01 ENCOUNTER — Encounter: Payer: Self-pay | Admitting: *Deleted

## 2016-02-01 NOTE — Progress Notes (Signed)
Faxed printed rx sinemet to pt pharmacy. Fax: [phone]: [phone]. Received confirmation.

## 2016-04-20 IMAGING — CR DG CHEST 2V
2 series · 2 of 2 positions shown · non-contrast
Comparison: 07/14/2014

CLINICAL DATA: Swollen RIGHT foot since [REDACTED], water blisters on
LEFT forearm, decreased appetite, increased lethargy, fatigue,
former smoker

EXAM:
CHEST  2 VIEW

[w chest lat]
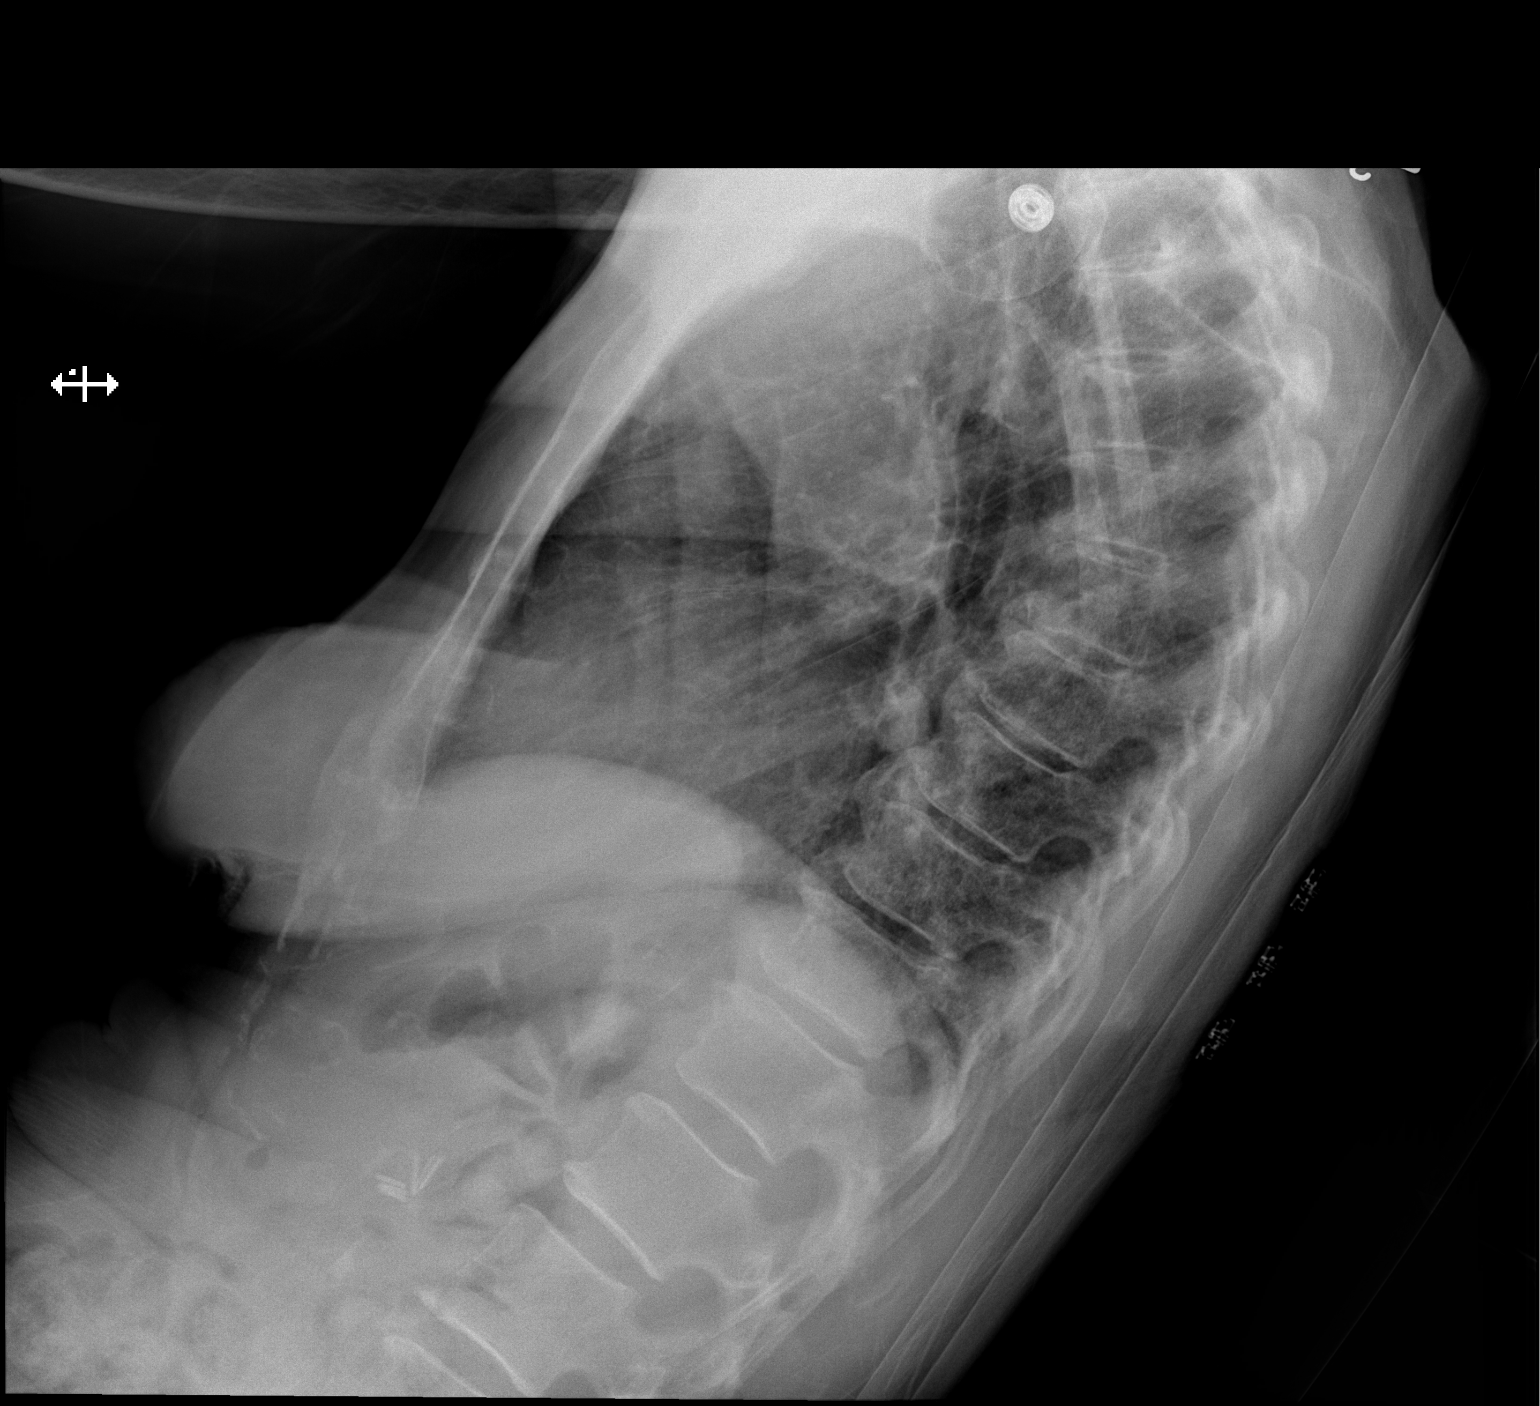

[x chest ap]
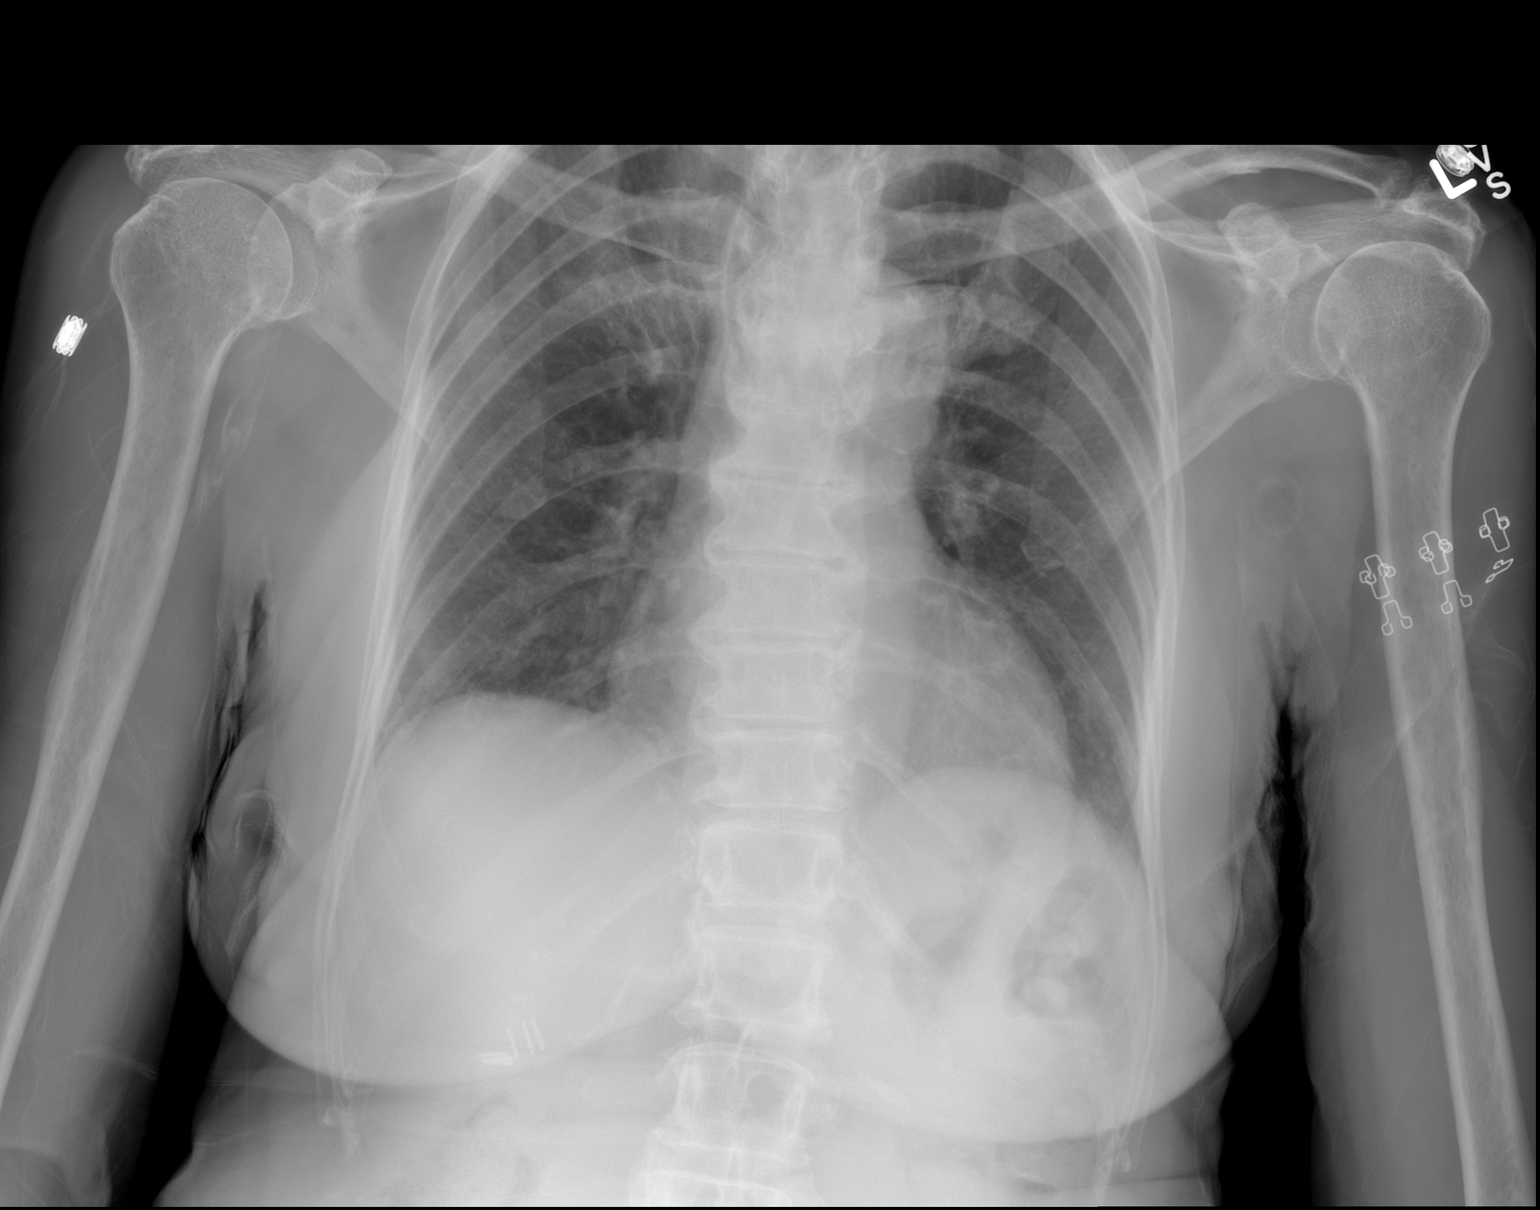

[2 of 2 positions shown; findings below may reference images not displayed]

FINDINGS: Borderline enlargement of cardiac silhouette.

Mediastinal contours and pulmonary vascularity normal.

Mild RIGHT basilar atelectasis.

Lungs otherwise clear.

No pleural effusion or pneumothorax.

Multilevel endplate spur formation thoracic spine.
IMPRESSION: No acute abnormalities.

## 2016-04-20 IMAGING — CT CT HEAD W/O CM
2 series · 16 of 30 positions shown, 20 images · non-contrast
Comparison: 07/14/2014.

CLINICAL DATA: Fatigue. Decreased appetite. Lethargy. Onset of
symptoms 07/23/2014.

EXAM:
CT HEAD WITHOUT CONTRAST
TECHNIQUE: Contiguous axial images were obtained from the base of the skull
through the vertex without intravenous contrast.

[Series 2: head w/o · axial · non-contrast · 0.45mm/px · z∈[-127,-12]mm · 13 of 27 slices shown, 17 images]
[im 2/27  brain]
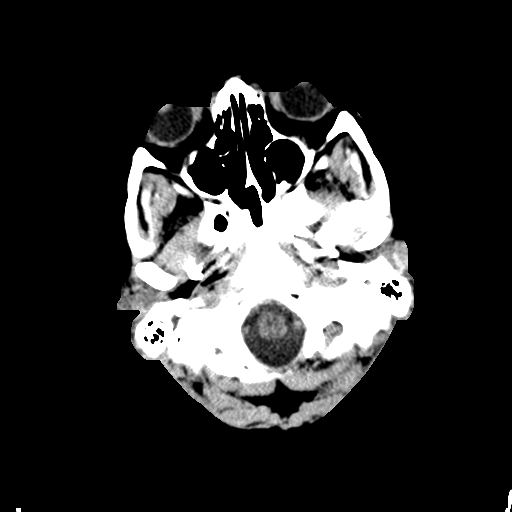
[im 2/27  bone]
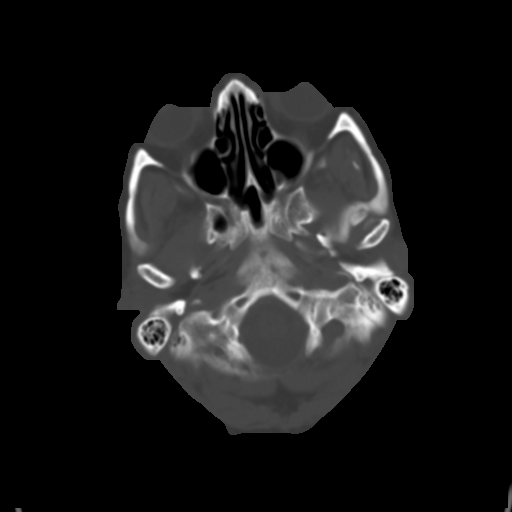
[im 4/27  brain]
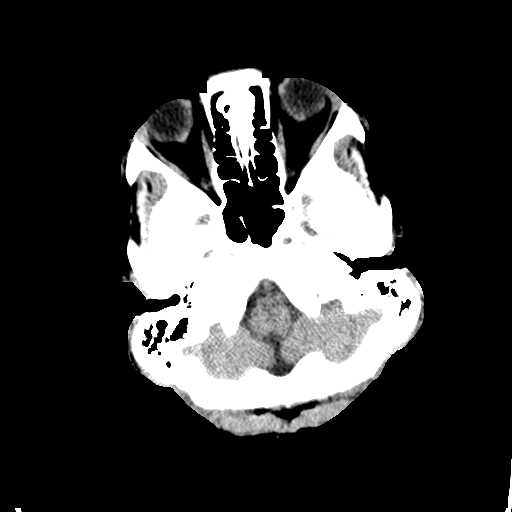
[im 6/27  brain]
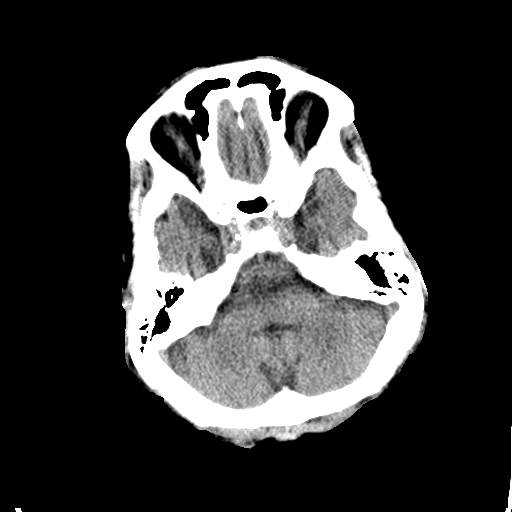
[im 8/27  brain]
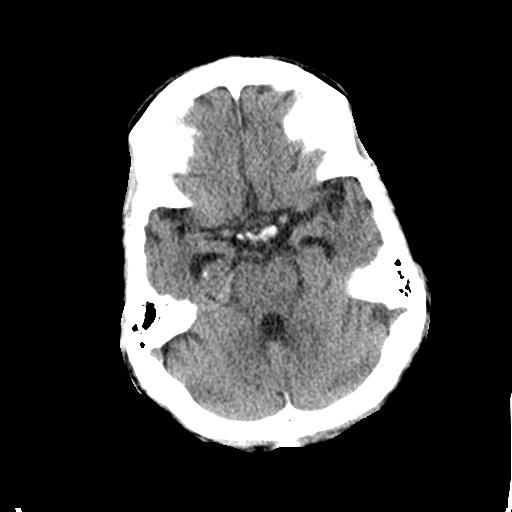
[im 10/27  brain]
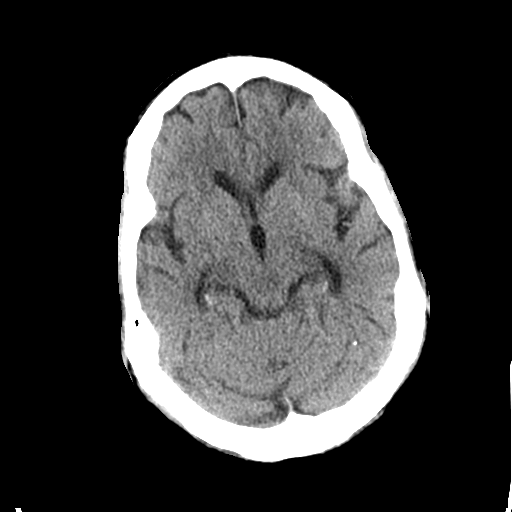
[im 10/27  bone]
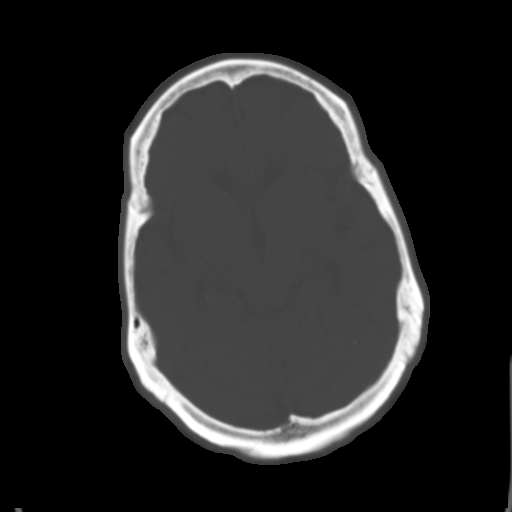
[im 12/27  brain]
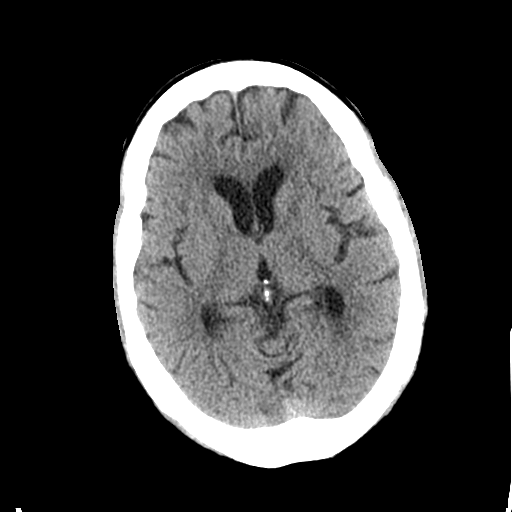
[im 14/27  brain]
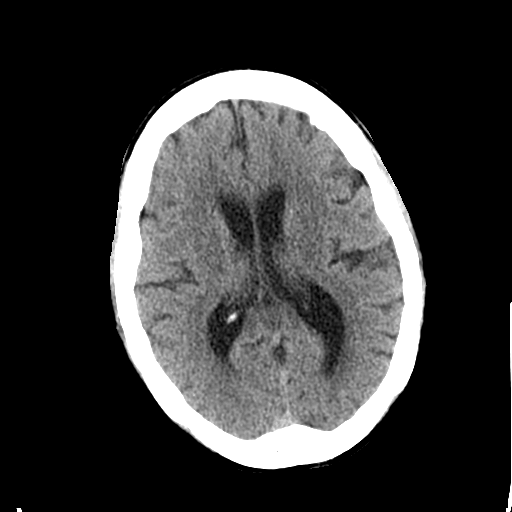
[im 15/27  brain]
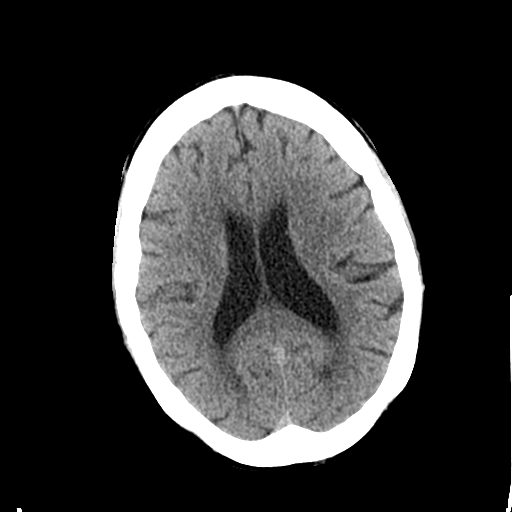
[im 17/27  brain]
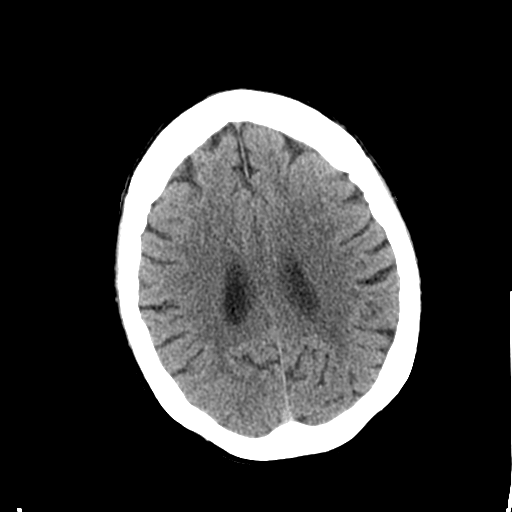
[im 17/27  bone]
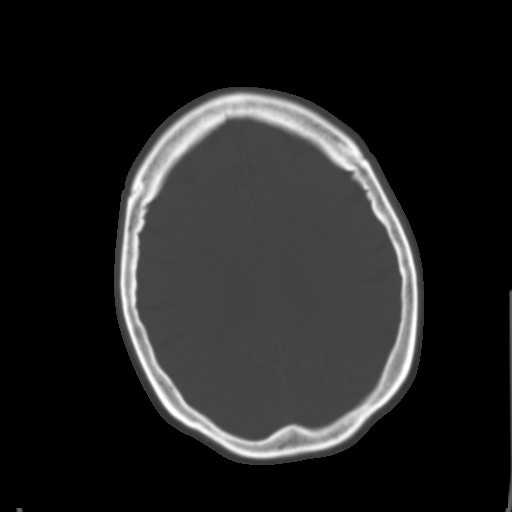
[im 19/27  brain]
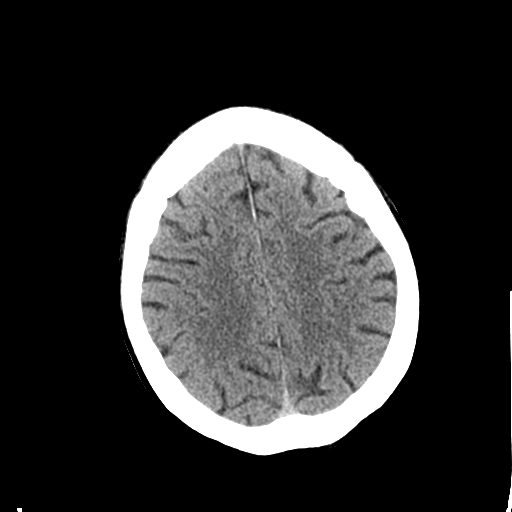
[im 21/27  brain]
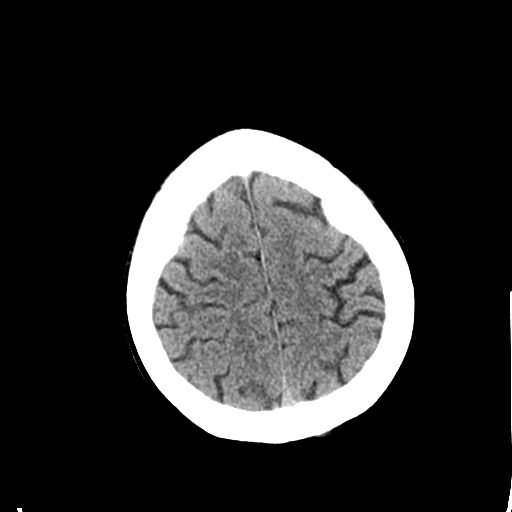
[im 23/27  brain]
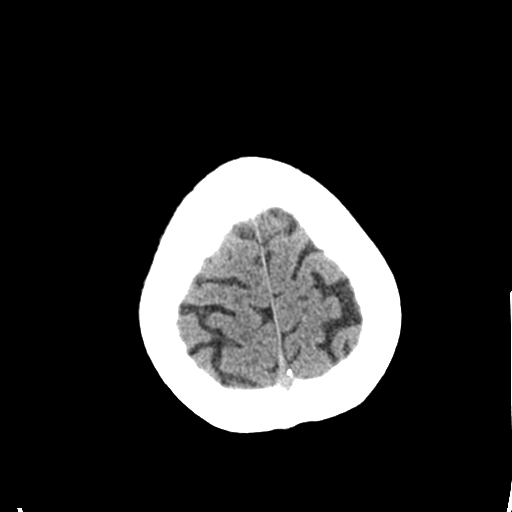
[im 25/27  brain]
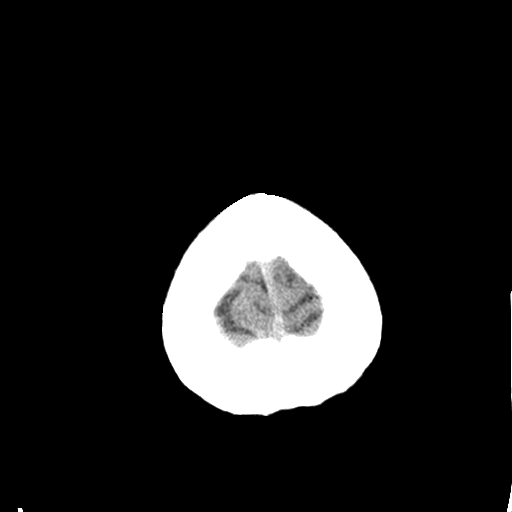
[im 25/27  bone]
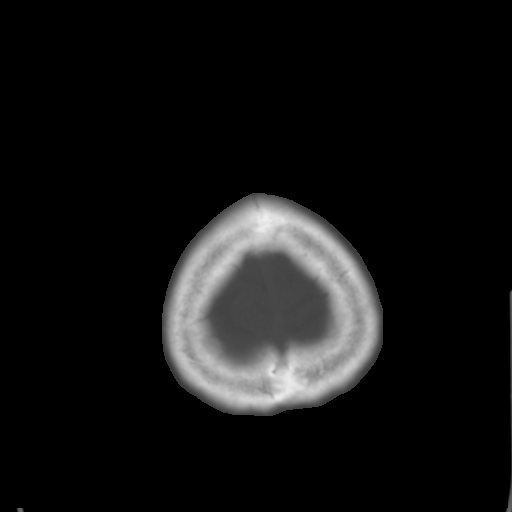

[Series 3: bone windows · axial · 0.45mm/px · z∈[-127,-87]mm · 3 of 27 slices shown]
[im 2/27  bone]
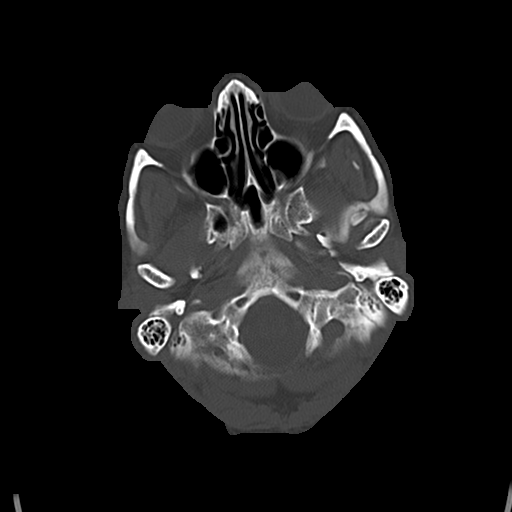
[im 6/27  bone]
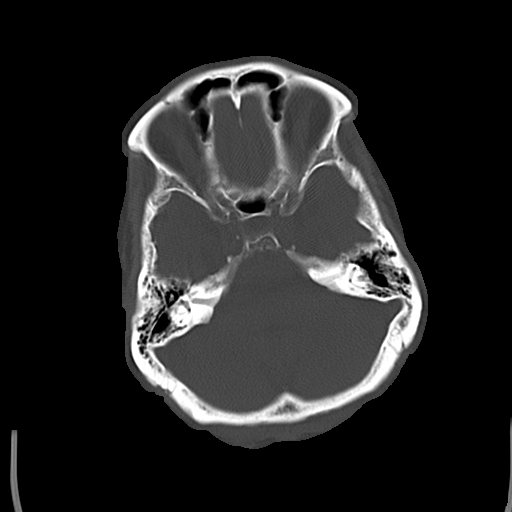
[im 10/27  bone]
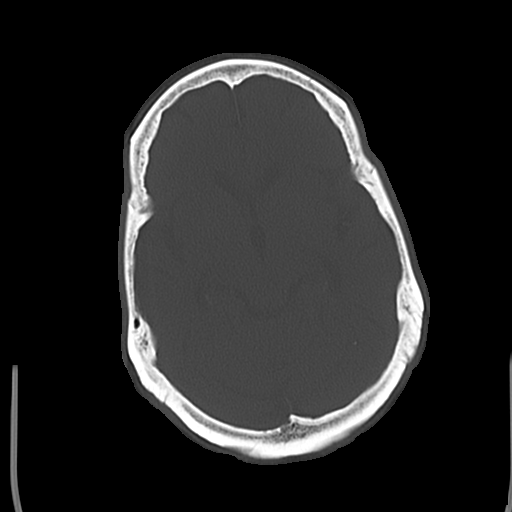

[16 of 30 positions shown; findings below may reference images not displayed]

FINDINGS: No mass lesion, mass effect, midline shift, hydrocephalus,
hemorrhage. No acute territorial cortical ischemia/infarct. Atrophy
and chronic ischemic white matter disease is present. The calvarium
is intact. Mastoid air cells are clear.
IMPRESSION: Mild atrophy and chronic ischemic white matter disease without acute
intracranial abnormality.

## 2016-07-09 ENCOUNTER — Other Ambulatory Visit: Payer: Self-pay | Admitting: Neurology

## 2016-07-12 ENCOUNTER — Telehealth: Payer: Self-pay | Admitting: Neurology

## 2016-07-12 ENCOUNTER — Other Ambulatory Visit: Payer: Self-pay | Admitting: Neurology

## 2016-07-12 MED ORDER — CARBIDOPA-LEVODOPA 25-100 MG PO TABS: 1.0000 | ORAL_TABLET | Freq: Three times a day (TID) | ORAL | 0 refills | Status: DC

## 2016-07-12 NOTE — Telephone Encounter (Signed)
[name] scheduled appt with Megan 6/11, medication follow up. RX for  carbidopa-levodopa (SINEMET IR) 25-100 MG tablet was denied, needed f/u. The patient has been out for 3 days. Can this be refilled now? She can be reached at [phone]/Pamela Adams(daughter)

## 2016-07-12 NOTE — Telephone Encounter (Signed)
Rx e-scribed to pharmacy as requested. Pt to keep f/u for ongoing refills.

## 2016-07-12 NOTE — Addendum Note (Signed)
Addended by: Eston EstersHOGAN, Gisella Alwine on: 07/12/2016 06:40 PM   Modules accepted: Orders

## 2016-07-16 ENCOUNTER — Ambulatory Visit (INDEPENDENT_AMBULATORY_CARE_PROVIDER_SITE_OTHER): Payer: Medicare Other | Admitting: Adult Health

## 2016-07-16 ENCOUNTER — Encounter: Payer: Self-pay | Admitting: Adult Health

## 2016-07-16 VITALS — BP 114/57 | HR 80 | Ht 64.0 in | Wt 114.0 lb

## 2016-07-16 DIAGNOSIS — G20 Parkinson's disease: Secondary | ICD-10-CM

## 2016-07-16 DIAGNOSIS — R269 Unspecified abnormalities of gait and mobility: Secondary | ICD-10-CM

## 2016-07-16 MED ORDER — CARBIDOPA-LEVODOPA 25-100 MG PO TABS: 1.0000 | ORAL_TABLET | Freq: Two times a day (BID) | ORAL | 3 refills | Status: DC

## 2016-07-16 NOTE — Progress Notes (Signed)
PATIENT: Susan Simmons DOB: 05/12/1934  REASON FOR VISIT: follow up- Parkinson's disease HISTORY FROM: patient  HISTORY OF PRESENT ILLNESS: Today 07/16/2016 [name] is a 82 year old female with a history of Parkinson's disease. She returns today for follow-up. Her daughter is with her today. Daughter reports that she has been out of medication for the past week. They went to pick up a refill but the pharmacy told him that it would take a week for them to get the medication in. Her daughter states that she has not noticed much change in her symptoms since being off of the medication. She denies any significant changes with her gait or balance. Reports her mother has always walked on her toes with knees bent. She does not use a walker or cane when ambulating. Daughter reports that this actually makes her walking worse and causes her to fall. Daughter reports that she had a fall approximately 1 month ago but fortunately did not sustain any injuries. Denies any trouble swallowing or chewing. Daughter reports that she is sleeping well with the help of melatonin. She does require assistance with all ADLs. She returns today for an evaluation.  Interval history 04/25/15: Susan Simmons is a 82 y.o. female here as a referral from Dr. Barbaraann Barthel for Parkinsonism disease. Here with lovely daughter who provides most information. She can get out of bed alone. Daughter helps minimally, just being around her so she doesn't fall. She last took her sinemet anout 8:30-9am. She has told her therapist not to tell daughter how well she is doing so that she doesn't have to do it on her own at home. Gaining weight. She is not having swallowing problems. Sometimes she is a little forgetful but maybe a little bit of memory loss.   Interval history 04/25/2015: She can get up on her own. They are walking every day. Daughter says she is walking better, can ambulate on her own. She has gained weight. PT is coming to the home.    Interval history 01/25/2015: She is taking Sinemet IR twice daily. 8:30am and 5pm. She did not want to take it 3 times a day even though she was prescribed inpatient. She does not want to take the CR overnight. She does not want to take the amantadine 100mg  bid she was prescribed. No dyskinesias. Will increase Sinemet to 3x a day. They decline the amantadine and CR at night. She gets increased tremor about 1pm which is likely when the Sinemet may be wearing off. Freezing less now. Walking a little better but still significant shuffling and balance problems. No dizziness. No drooling. Her voice is hypophonic. She is getting physical therapy at home. No swallowing problems, no choking. Appetite is excellent. She has gained weight. No constipation.    Interval history 11/01/2014: Things are getting worse. Worse with walking. More shuffling. No tremor. Voice not hypophonic, She was in rehab for 6 weeks after UTI with encephalopathy. Not acting out dreams or having vivid dreams. She has some drooling. She has difficulty getting out of a chair, won't get up on her own, won't stand on her own. Maybe some hallucinations, she will see family members that are not there. She has urinary incontinence. She has depression and mood changes. Some days her memory is excellent, other days she seems demented. Patient reports she feels she internally shaking.   HPI: Susan Simmons is a 82 y.o. female here as a referral from Dr. Barbaraann Barthel for   For 5 years, she has had  bad depression since her husband passed. She laid in bed for 5 years, stopped doing everything, then had muscle disuse. Things are better now. She has moved in with her daughter. Daughter provides all information. She has been to physical therapy, been to inpatient PT, daughter works every day to keep up her strength, she goes to adult daycare. Patient still has decreased interest, decreased social interest. She can walk normal with encouragement. No  falls, instability, Mother says she has tremors, "like I am scared". Daughter says she she has anxiety. Patient says she notice it when using the hand, like when dressing. Decreased concentration.She heasitates, says she is "scared to get up". Symptoms have improved since she first moved in with daughter, and some days she can even "walk normally". She has been on 14 different medications. Unclear use of dopamine blocking agents. Severe worsening depression, refractory  REVIEW OF SYSTEMS: Out of a complete 14 system review of symptoms, the patient complains only of the following symptoms, and all other reviewed systems are negative.  See history of present illness  ALLERGIES: No Known Allergies  HOME MEDICATIONS: Outpatient Medications Prior to Visit  Medication Sig Dispense Refill  . busPIRone (BUSPAR) 15 MG tablet Take 15 mg by mouth 2 (two) times daily.    . carbidopa-levodopa (SINEMET IR) 25-100 MG tablet Take 1 tablet by mouth 3 (three) times daily. 270 tablet 0  . feeding supplement, ENSURE ENLIVE, (ENSURE ENLIVE) LIQD Take 237 mLs by mouth 2 (two) times daily between meals. 237 mL 12  . mirtazapine (REMERON) 15 MG tablet Take 15 mg by mouth at bedtime.    . risperiDONE (RISPERDAL) 0.25 MG tablet Take 0.25 mg by mouth daily.     No facility-administered medications prior to visit.     PAST MEDICAL HISTORY: Past Medical History:  Diagnosis Date  . Anxiety   . Depression   . Heart murmur   . Parkinson disease (HCC)     PAST SURGICAL HISTORY: Past Surgical History:  Procedure Laterality Date  . denies surgical history      FAMILY HISTORY: Family History  Problem Relation Age of Onset  . Dementia Neg Hx   . Parkinsonism Neg Hx     SOCIAL HISTORY: Social History   Social History  . Marital status: Divorced    Spouse name: N/A  . Number of children: 4  . Years of education: 4011   Occupational History  . Not on file.   Social History Main Topics  . Smoking  status: Former Smoker    Quit date: 03/04/1996  . Smokeless tobacco: Never Used  . Alcohol use No  . Drug use: No  . Sexual activity: Not on file   Other Topics Concern  . Not on file   Social History Narrative   Lives at home with daughter   Caffeine use: none      PHYSICAL EXAM  Vitals:   07/16/16 1310  BP: (!) 114/57  Pulse: 80  Weight: 114 lb (51.7 kg)  Height: 5\' 4"  (1.626 m)   Body mass index is 19.57 kg/m.  Generalized: Well developed, in no acute distress   Neurological examination  Mentation: Alert. Some difficulty following commands. Patient is drowsy. Cranial nerve II-XII: Pupils were equal round reactive to light. Extraocular movements were full, visual field were full on confrontational test. Facial sensation and strength were normal. Uvula tongue midline. Head turning and shoulder shrug  were normal and symmetric. Motor: Good strength throughout. Rigidity noted in the  upper extremities. Sensory: Sensory testing is intact to soft touch on all 4 extremities. No evidence of extinction is noted.  Coordination: Patient has difficulty performing finger-to-nose and heel to shin. I have to repeat directions repetitively. Daughter reports is because she is sleepy. Gait and station: Patient requires assistance with standing. The patient's gait is unsteady. She walks on her toes with knees bent. Daughter has to assist her by holding her arm. Patient has a shuffling gait. Patient almost fell when walking out but daughter caught her.    DIAGNOSTIC DATA (LABS, IMAGING, TESTING) - I reviewed patient records, labs, notes, testing and imaging myself where available.  Lab Results  Component Value Date   WBC 7.5 11/18/2015   HGB 13.4 11/18/2015   HCT 40.0 11/18/2015   MCV 87.1 11/18/2015   PLT 273 11/18/2015      Component Value Date/Time   NA 141 11/20/2015 0351   NA 141 11/20/2015   K 3.9 11/20/2015 0351   CL 112 (H) 11/20/2015 0351   CO2 25 11/20/2015 0351    GLUCOSE 89 11/20/2015 0351   BUN 17 11/20/2015 0351   BUN 17 11/20/2015   CREATININE 0.99 11/20/2015 0351   CALCIUM 8.2 (L) 11/20/2015 0351   PROT 6.7 01/06/2015 1804   ALBUMIN 3.1 (L) 01/06/2015 1804   AST 25 01/06/2015 1804   ALT 12 (L) 01/06/2015 1804   ALKPHOS 71 01/06/2015 1804   BILITOT 0.5 01/06/2015 1804   GFRNONAA 53 (L) 11/20/2015 0351   GFRAA >60 11/20/2015 0351   Lab Results  Component Value Date   CHOL 162 01/07/2015   HDL 51 01/07/2015   LDLCALC 101 (H) 01/07/2015   TRIG 50 01/07/2015   CHOLHDL 3.2 01/07/2015   Lab Results  Component Value Date   HGBA1C 5.5 01/07/2015   Lab Results  Component Value Date   VITAMINB12 453 08/02/2014   Lab Results  Component Value Date   TSH 1.682 01/06/2015      ASSESSMENT AND PLAN 82 y.o. year old female  has a past medical history of Anxiety; Depression; Heart murmur; and Parkinson disease (HCC). here with:  1. Parkinson's disease 2. Abnormality of gait  Patient will continue on Sinemet 25-100 milligrams twice a day. At this time the daughter does not want to increase the dose. I advised that if she started to experience increase in symptoms between dosing she could try taking 1/2-1 tablet around lunchtime. Daughter voiced understanding. Patient's gait is unstable. According to the daughter they have tried assistive devices but that made her walking worse. Patient and daughter are advised that if her symptoms worsen or she develops new symptoms they should  let us know. She will follow-up in 6 months with Dr. Lucia Gaskins.     Butch Penny, MSN, NP-C 07/16/2016, 1:32 PM [facility] [address] [place], [place] [address] [phone]

## 2016-07-16 NOTE — Patient Instructions (Signed)
Continue Sinemet 25-100 mg twice a day. Can increase to 1/2- 1 tablet at lunch if needed If your symptoms worsen or you develop new symptoms please let us know.

## 2016-08-18 NOTE — Progress Notes (Signed)
Personally  participated in, made any corrections needed, and agree with history, physical, neuro exam,assessment and plan as stated.     Antonia Ahern, MD [facility]     

## 2016-09-23 ENCOUNTER — Emergency Department (HOSPITAL_COMMUNITY)
Admission: EM | Admit: 2016-09-23 | Discharge: 2016-09-24 | Disposition: A | Discharge status: Skilled Nursing Facility | Payer: Medicare Other | Attending: Emergency Medicine | Admitting: Emergency Medicine

## 2016-09-23 ENCOUNTER — Emergency Department (HOSPITAL_COMMUNITY): Payer: Medicare Other

## 2016-09-23 ENCOUNTER — Encounter (HOSPITAL_COMMUNITY): Payer: Self-pay | Admitting: Emergency Medicine

## 2016-09-23 DIAGNOSIS — R531 Weakness: Secondary | ICD-10-CM

## 2016-09-23 DIAGNOSIS — Y939 Activity, unspecified: Secondary | ICD-10-CM | POA: Diagnosis not present

## 2016-09-23 DIAGNOSIS — Y92129 Unspecified place in nursing home as the place of occurrence of the external cause: Secondary | ICD-10-CM | POA: Diagnosis not present

## 2016-09-23 DIAGNOSIS — W19XXXA Unspecified fall, initial encounter: Secondary | ICD-10-CM | POA: Diagnosis not present

## 2016-09-23 DIAGNOSIS — Y999 Unspecified external cause status: Secondary | ICD-10-CM | POA: Diagnosis not present

## 2016-09-23 DIAGNOSIS — G20 Parkinson's disease: Secondary | ICD-10-CM | POA: Insufficient documentation

## 2016-09-23 DIAGNOSIS — Z79899 Other long term (current) drug therapy: Secondary | ICD-10-CM | POA: Diagnosis not present

## 2016-09-23 DIAGNOSIS — Z87891 Personal history of nicotine dependence: Secondary | ICD-10-CM | POA: Insufficient documentation

## 2016-09-23 DIAGNOSIS — S8000XA Contusion of unspecified knee, initial encounter: Secondary | ICD-10-CM

## 2016-09-23 DIAGNOSIS — S8002XA Contusion of left knee, initial encounter: Secondary | ICD-10-CM | POA: Diagnosis not present

## 2016-09-23 DIAGNOSIS — S8001XA Contusion of right knee, initial encounter: Secondary | ICD-10-CM | POA: Insufficient documentation

## 2016-09-23 DIAGNOSIS — F0390 Unspecified dementia without behavioral disturbance: Secondary | ICD-10-CM | POA: Diagnosis not present

## 2016-09-23 DIAGNOSIS — Z8669 Personal history of other diseases of the nervous system and sense organs: Secondary | ICD-10-CM

## 2016-09-23 LAB — COMPREHENSIVE METABOLIC PANEL
ALT: 11 U/L — AB (ref 14–54)
AST: 52 U/L — ABNORMAL HIGH (ref 15–41)
Albumin: 3.4 g/dL — ABNORMAL LOW (ref 3.5–5.0)
Alkaline Phosphatase: 59 U/L (ref 38–126)
Anion gap: 9 (ref 5–15)
BUN: 24 mg/dL — AB (ref 6–20)
CHLORIDE: 106 mmol/L (ref 101–111)
CO2: 27 mmol/L (ref 22–32)
CREATININE: 1.02 mg/dL — AB (ref 0.44–1.00)
Calcium: 9 mg/dL (ref 8.9–10.3)
GFR, EST AFRICAN AMERICAN: 59 mL/min — AB (ref >60)
GFR, EST NON AFRICAN AMERICAN: 51 mL/min — AB (ref >60)
GLUCOSE: 95 mg/dL (ref 65–99)
Potassium: 4.3 mmol/L (ref 3.5–5.1)
Sodium: 142 mmol/L (ref 135–145)
Total Bilirubin: 0.5 mg/dL (ref 0.3–1.2)
Total Protein: 7.2 g/dL (ref 6.5–8.1)

## 2016-09-23 LAB — I-STAT CG4 LACTIC ACID, ED: LACTIC ACID, VENOUS: 1.44 mmol/L (ref 0.5–1.9)

## 2016-09-23 LAB — CBC WITH DIFFERENTIAL/PLATELET
Basophils Absolute: 0 10*3/uL (ref 0.0–0.1)
Basophils Relative: 0 %
EOS ABS: 0 10*3/uL (ref 0.0–0.7)
EOS PCT: 0 %
HCT: 40.9 % (ref 36.0–46.0)
Hemoglobin: 13.2 g/dL (ref 12.0–15.0)
LYMPHS ABS: 1.3 10*3/uL (ref 0.7–4.0)
LYMPHS PCT: 19 %
MCH: 28.9 pg (ref 26.0–34.0)
MCHC: 32.3 g/dL (ref 30.0–36.0)
MCV: 89.5 fL (ref 78.0–100.0)
MONO ABS: 1.1 10*3/uL — AB (ref 0.1–1.0)
MONOS PCT: 16 %
Neutro Abs: 4.4 10*3/uL (ref 1.7–7.7)
Neutrophils Relative %: 65 %
PLATELETS: 266 10*3/uL (ref 150–400)
RBC: 4.57 MIL/uL (ref 3.87–5.11)
RDW: 13.1 % (ref 11.5–15.5)
WBC: 6.8 10*3/uL (ref 4.0–10.5)

## 2016-09-23 LAB — URINALYSIS, ROUTINE W REFLEX MICROSCOPIC
BILIRUBIN URINE: NEGATIVE
GLUCOSE, UA: NEGATIVE mg/dL
HGB URINE DIPSTICK: NEGATIVE
Ketones, ur: 5 mg/dL — AB
Leukocytes, UA: NEGATIVE
Nitrite: NEGATIVE
PROTEIN: NEGATIVE mg/dL
Specific Gravity, Urine: 1.024 (ref 1.005–1.030)
pH: 6 (ref 5.0–8.0)

## 2016-09-23 MED ORDER — MIRTAZAPINE 30 MG PO TABS
15.0000 mg | ORAL_TABLET | Freq: Every day | ORAL | Status: DC
  Administered 2016-09-23: 15 mg via ORAL
  Filled 2016-09-23: qty 1

## 2016-09-23 MED ORDER — CARBIDOPA-LEVODOPA 25-100 MG PO TABS
1.0000 | ORAL_TABLET | Freq: Two times a day (BID) | ORAL | Status: DC
  Administered 2016-09-24: 1 via ORAL
  Filled 2016-09-23 (×3): qty 1

## 2016-09-23 MED ORDER — BUSPIRONE HCL 10 MG PO TABS
15.0000 mg | ORAL_TABLET | Freq: Two times a day (BID) | ORAL | Status: DC
  Administered 2016-09-23: 15 mg via ORAL
  Filled 2016-09-23 (×2): qty 2

## 2016-09-23 MED ORDER — ENSURE ENLIVE PO LIQD
237.0000 mL | Freq: Two times a day (BID) | ORAL | Status: DC
  Administered 2016-09-23: 237 mL via ORAL
  Filled 2016-09-23 (×3): qty 237

## 2016-09-23 NOTE — ED Triage Notes (Signed)
Pt brought in by caregiver, caregiver reports she was assisting patient with ambulation today and they fell. Pt c/o pain to RLE. Caregiver also reports that patient has wound to sacrum, urine has foul odor.

## 2016-09-23 NOTE — ED Notes (Signed)
Patient transported to X-ray 

## 2016-09-23 NOTE — ED Provider Notes (Signed)
[facility]-EMERGENCY DEPT Provider Note   CSN: [id number] Arrival date & time: 09/23/16  1338     History   Chief Complaint Chief Complaint  Patient presents with  . Fall  . Leg Pain  . Wound Infection    HPI Susan Simmons is a 82 y.o. female.  Patient is an 55 -year-old female with past mental history of Parkinson's disease presenting after a fall. According to the patient's daughter/caretaker she has been weak since this morning and having difficulty ambulating. This afternoon, her "legs would not work" and she slid to the floor injuring both knees. Daughter is also concerned that she has strong, foul-smelling urine is also concerned about a sore to the sacral region. There've been no fevers or chills. She denies any chest pain, shortness of breath, or other complaints.   The history is provided by the patient.  Fall  This is a new problem. The current episode started 1 to 2 hours ago. The problem occurs constantly. The problem has not changed since onset.Pertinent negatives include no chest pain and no shortness of breath. Exacerbated by: Ambulation. The symptoms are relieved by rest. She has tried nothing for the symptoms.    Past Medical History:  Diagnosis Date  . Anxiety   . Depression   . Heart murmur   . Parkinson disease [facility])     Patient Active Problem List   Diagnosis Date Noted  . Dementia due to Parkinson's disease with behavioral disturbance (HCC) 12/19/2015  . Psychosis 12/19/2015  . AKI (acute kidney injury) (HCC) 11/22/2015  . Dehydration 11/22/2015  . UTI (urinary tract infection) 02/05/2015  . Parkinsonism (HCC) 01/25/2015  . Abnormality of gait   . Leukopenia 01/07/2015  . Anxiety 11/12/2014  . Malnutrition of moderate degree 11/10/2014  . Pressure ulcer stage II 11/09/2014  . Acute encephalopathy 08/02/2014  . Depression 05/02/2014    Past Surgical History:  Procedure Laterality Date  . denies surgical history      OB History    No data  available       Home Medications    Prior to Admission medications   Medication Sig Start Date End Date Taking? Authorizing Provider  busPIRone (BUSPAR) 15 MG tablet Take 15 mg by mouth 2 (two) times daily.    [provider]  carbidopa-levodopa (SINEMET IR) 25-100 MG tablet Take 1 tablet by mouth 2 (two) times daily. 07/16/16   Butch Penny, NP  feeding supplement, ENSURE ENLIVE, (ENSURE ENLIVE) LIQD Take 237 mLs by mouth 2 (two) times daily between meals. 11/11/14   Vassie Loll, MD  mirtazapine (REMERON) 15 MG tablet Take 15 mg by mouth at bedtime.    [provider]    Family History Family History  Problem Relation Age of Onset  . Dementia Neg Hx   . Parkinsonism Neg Hx     Social History Social History  Substance Use Topics  . Smoking status: Former Smoker    Quit date: 03/04/1996  . Smokeless tobacco: Never Used  . Alcohol use No     Allergies   Patient has no known allergies.   Review of Systems Review of Systems  Respiratory: Negative for shortness of breath.   Cardiovascular: Negative for chest pain.  All other systems reviewed and are negative.    Physical Exam Updated Vital Signs BP 120/70 (BP Location: Right Arm)   Pulse 78   Temp 98.3 F (36.8 C) (Oral)   Resp 16   SpO2 95%   Physical Exam  Constitutional: She is oriented to person, place, and time. She appears well-developed and well-nourished. No distress.  HENT:  Head: Normocephalic and atraumatic.  Eyes: Pupils are equal, round, and reactive to light. EOM are normal.  Neck: Normal range of motion. Neck supple.  Cardiovascular: Normal rate and regular rhythm.  Exam reveals no gallop and no friction rub.   No murmur heard. Pulmonary/Chest: Effort normal and breath sounds normal. No respiratory distress. She has no wheezes.  Abdominal: Soft. Bowel sounds are normal. She exhibits no distension. There is no tenderness.  Musculoskeletal: Normal range of motion.  There are  bruises to the anterior aspect of both knees, however no obvious deformity. She appears to have no significant discomfort with range of motion of the hips.  Neurological: She is alert and oriented to person, place, and time.  Skin: Skin is warm and dry. She is not diaphoretic.  Nursing note and vitals reviewed.    ED Treatments / Results  Labs (all labs ordered are listed, but only abnormal results are displayed) Labs Reviewed  COMPREHENSIVE METABOLIC PANEL  CBC WITH DIFFERENTIAL/PLATELET  URINALYSIS, ROUTINE W REFLEX MICROSCOPIC  I-STAT CG4 LACTIC ACID, ED    EKG  EKG Interpretation None       Radiology No results found.  Procedures Procedures (including critical care time)  Medications Ordered in ED Medications - No data to display   Initial Impression / Assessment and Plan / ED Course  I have reviewed the triage vital signs and the nursing notes.  Pertinent labs & imaging results that were available during my care of the patient were reviewed by me and considered in my medical decision making (see chart for details).  Patient with history of Parkinson's disease brought by daughter for evaluation of weakness that has progressed over the past several days. She fell earlier today and is complaining of pain in her knees. X-rays of these areas are negative and workup reveals no obvious cause for her weakness. The daughter states that this has happened in the past and she has required a stay in rehabilitation for her to improve. The daughter does not feel as though she can safely go home.  I see no indication for medical admission. She will remain in the department until morning and will undergo consultation with case management to see if they can arrange placement in a rehabilitation facility.  Final Clinical Impressions(s) / ED Diagnoses   Final diagnoses:  None    New Prescriptions New Prescriptions   No medications on file     Geoffery Lyons, MD 09/23/16  2247

## 2016-09-24 MED ORDER — ENSURE ENLIVE PO LIQD
237.0000 mL | Freq: Two times a day (BID) | ORAL | Status: DC
  Administered 2016-09-24: 237 mL via ORAL
  Filled 2016-09-24 (×2): qty 237

## 2016-09-24 NOTE — Discharge Planning (Signed)
[facility] notes order for SNF placement.  [facility] consulted PT for eval and SW for possible placement.  Will await PT eval before proceeding with disposition.

## 2016-09-24 NOTE — ED Notes (Signed)
Information faxed to [facility] nursing facility per facility request

## 2016-09-24 NOTE — ED Notes (Signed)
Bed: [facility] Expected date:  Expected time:  Means of arrival:  Comments: Hold for 15 

## 2016-09-24 NOTE — Evaluation (Signed)
Physical Therapy Evaluation Patient Details Name: Susan Simmons MRN: [id number] DOB: Apr 13, 1934 Today's Date: 09/24/2016   History of Present Illness  82 yo female brought to ED after sustaining a fall at home. Hx of Parkinson's disease, frequent falls, UTI, depression.   Clinical Impression  Bed level eval only. Pt appears very lethargic. She responds minimally to verbal and tactile stimulation. She did not open her eyes at all during my assessment. During assessment, pt moved her UEs with Max multimodal cueing and assistance from PT. No family present during session. Unsure of pt's baseline of function-chart reports multiple falls. At this time, recommendation is for SNF.  Family may need to consider long term placement if pt does not respond to trial of ST rehab.     Follow Up Recommendations SNF    Equipment Recommendations  None recommended by PT    Recommendations for Other Services OT consult     Precautions / Restrictions Precautions Precautions: Fall Restrictions Weight Bearing Restrictions: No      Mobility  Bed Mobility               General bed mobility comments: NT-pt too lethargic to safely attempt at this time  Transfers                    Ambulation/Gait                Stairs            Wheelchair Mobility    Modified Rankin (Stroke Patients Only)       Balance                                             Pertinent Vitals/Pain Pain Assessment: Faces Faces Pain Scale: Hurts little more Pain Location: L knee when asked which knee hurts Pain Intervention(s): Limited activity within patient's tolerance    Home Living Family/patient expects to be discharged to:: Skilled nursing facility                 Additional Comments: No family present and pt unable to provide history at this time    Prior Function Level of Independence: Needs assistance   Gait / Transfers Assistance Needed: pt unable to  provide PLOF. no family present.      Comments: frequent falls per chart     Hand Dominance        Extremity/Trunk Assessment   Upper Extremity Assessment Upper Extremity Assessment: Generalized weakness (pt assisted minimally with moving UEs with Max multimodal cueing)    Lower Extremity Assessment Lower Extremity Assessment: Difficult to assess due to impaired cognition. Pt did not attempt to move LEs at all when cued.       Communication      Cognition Arousal/Alertness: Lethargic Behavior During Therapy: Flat affect Overall Cognitive Status: Difficult to assess                                        General Comments      Exercises     Assessment/Plan    PT Assessment Patient needs continued PT services  PT Problem List Decreased strength;Decreased cognition;Decreased mobility;Decreased activity tolerance;Pain       PT Treatment Interventions Therapeutic activities;Patient/family education;Functional mobility training;DME instruction;Gait training;Therapeutic exercise;Balance  training    PT Goals (Current goals can be found in the Care Plan section)  Acute Rehab PT Goals Patient Stated Goal: none stated. no family present. PT Goal Formulation: Patient unable to participate in goal setting Time For Goal Achievement: 10/08/16 Potential to Achieve Goals: Fair    Frequency Min 2X/week   Barriers to discharge        Co-evaluation               AM-PAC PT "6 Clicks" Daily Activity  Outcome Measure Difficulty turning over in bed (including adjusting bedclothes, sheets and blankets)?: Unable Difficulty moving from lying on back to sitting on the side of the bed? : Unable Difficulty sitting down on and standing up from a chair with arms (e.g., wheelchair, bedside commode, etc,.)?: Unable Help needed moving to and from a bed to chair (including a wheelchair)?: Total Help needed walking in hospital room?: Total Help needed climbing 3-5  steps with a railing? : Total 6 Click Score: 6    End of Session   Activity Tolerance: Patient limited by lethargy Patient left: in bed   PT Visit Diagnosis: Difficulty in walking, not elsewhere classified (R26.2);Muscle weakness (generalized) (M62.81)    Time: 6160-7371 PT Time Calculation (min) (ACUTE ONLY): 8 min   Charges:   PT Evaluation $PT Eval Moderate Complexity: 1 Mod     PT G Codes:         Rebeca Alert, MPT Pager: [phone]

## 2016-09-24 NOTE — Progress Notes (Signed)
Pt ready to discharge to [facility]. CSW confirmed with [place] at [facility] and sent AVS. RN to arrange transportation with [facility] and call report to [phone] for room 509. CSW updated Charge RN to make RN Fleet Contras aware, as she is currently in pt's room. CSW updated daughter-[name] Pernell Dupre [phone]) of discharge plan. CSW signing off as no further Social Work needs identified.   Corlis Hove, LCSWA, LCASA Clinical Social Work [phone]

## 2016-09-24 NOTE — ED Notes (Signed)
Physical therapy at bedside

## 2016-09-24 NOTE — Progress Notes (Signed)
   09/24/16 0957  PT Time Calculation  PT Start Time (ACUTE ONLY) 5790  PT Stop Time (ACUTE ONLY) 0946  PT Time Calculation (min) (ACUTE ONLY) 8 min  PT G-Codes **NOT FOR INPATIENT CLASS**  Functional Assessment Tool Used AM-PAC 6 Clicks Basic Mobility;Clinical judgement  Functional Limitation Mobility: Walking and moving around  Mobility: Walking and Moving Around Current Status (X8333) CM  Mobility: Walking and Moving Around Goal Status (O3291) CL  PT General Charges  $$ ACUTE PT VISIT 1 Visit  PT Evaluation  $PT Eval Moderate Complexity 1 Mod   [place], MPT [phone]

## 2016-09-24 NOTE — Clinical Social Work Note (Signed)
Clinical Social Work Assessment  Patient Details  Name: Susan Simmons MRN: [id number] Date of Birth: 22-Dec-1934  Date of referral:  09/24/16               Reason for consult:  Discharge Planning                Permission sought to share information with:  Family Supports Permission granted to share information::   (Pt has Dementia)  Name::     [place]::  SNFs  Relationship::  Daughter  Contact Information:  [phone]  Housing/Transportation Living arrangements for the past 2 months:  Single Family Home Source of Information:  Adult Children Engineer, maintenance) Patient Interpreter Needed:  None Criminal Activity/Legal Involvement Pertinent to Current Situation/Hospitalization:  No - Comment as needed Significant Relationships:  Adult Children Lives with:  Adult Children Do you feel safe going back to the place where you live?  Yes Need for family participation in patient care:  Yes (Comment) (Pt has Dementia)  Care giving concerns:  No care giving concerns identified.    Social Worker assessment / plan:  CSW consulted for SNF placement. CSW met with pt at bedside. However, pt minimally responsive and lethargic. Pt has Dementia and Parkinson disease. CSW spoke with daughterJeannene Patella [facility] [phone]) via phone about pt discharge. Daughter states pt has had more falls recently and daughter fell with pt on last fall. Daughter states pt went to [facility] previously for short term rehab following a UTI.   Daughter prefers SNF ([facility]), but is not opposed to [place] services. CSW staffed with MD, RNCM, and Palliative MD. CSW to fax [place]-2 for SNF placement. CSW continuing to follow for discharge needs.   Employment status:  Retired Forensic scientist:  Programmer, applications ([employer]) PT Recommendations:  [place] / Referral to community resources:  [place]  Patient/Family's Response to care:  Pt lethargic,  possibly due to meds given. Daughter responded well to care and is appreciative of CSW support and guidance.   Patient/Family's Understanding of and Emotional Response to Diagnosis, Current Treatment, and Prognosis:  Pt has Dementia and unsure as to pt's understanding of current medical state. Daughter has good understanding of current medical state.   Emotional Assessment Appearance:  Appears stated age Attitude/Demeanor/Rapport:  Lethargic Affect (typically observed):  Unable to Assess Orientation:  Oriented to Self, Oriented to Place, Oriented to  Time Alcohol / Substance use:  Other Psych involvement (Current and /or in the community):  No (Comment)  Discharge Needs  Concerns to be addressed:  Discharge Planning Concerns Readmission within the last 30 days:  No Current discharge risk:  Dependent with Mobility, Cognitively Impaired Barriers to Discharge:  Continued Medical Work up   [employer], LCSW 09/24/2016, 10:25 AM

## 2016-09-24 NOTE — NC FL2 (Signed)
[place] MEDICAID FL2 LEVEL OF CARE SCREENING TOOL     IDENTIFICATION  Patient Name: Susan Simmons Birthdate: 07/23/1934 Sex: female Admission Date (Current Location): 09/23/2016  [facility] and [place] Number:  Producer, television/film/video and Address:  [facility],  [address], [place] [address]      Provider Number: [id number]  Attending Physician Name and Address:  No att. providers found  Relative Name and Phone Number:       Current Level of Care: Hospital Recommended Level of Care: Skilled Nursing Facility Prior Approval Number:    Date Approved/Denied:   PASRR Number:   [id number]  Discharge Plan: SNF    Current Diagnoses: Patient Active Problem List   Diagnosis Date Noted  . Dementia due to Parkinson's disease with behavioral disturbance (HCC) 12/19/2015  . Psychosis 12/19/2015  . AKI (acute kidney injury) (HCC) 11/22/2015  . Dehydration 11/22/2015  . UTI (urinary tract infection) 02/05/2015  . Parkinsonism (HCC) 01/25/2015  . Abnormality of gait   . Leukopenia 01/07/2015  . Anxiety 11/12/2014  . Malnutrition of moderate degree 11/10/2014  . Pressure ulcer stage II 11/09/2014  . Acute encephalopathy 08/02/2014  . Depression 05/02/2014    Orientation RESPIRATION BLADDER Height & Weight        Normal Incontinent Weight:   Height:     BEHAVIORAL SYMPTOMS/MOOD NEUROLOGICAL BOWEL NUTRITION STATUS      Incontinent Diet (regular )  AMBULATORY STATUS COMMUNICATION OF NEEDS Skin   Extensive Assist Verbally Skin abrasions (wound on bottom )                       Personal Care Assistance Level of Assistance  Bathing, Feeding, Dressing Bathing Assistance: Maximum assistance Feeding assistance: Limited assistance Dressing Assistance: Maximum assistance Total Care Assistance: Maximum assistance   Functional Limitations Info             SPECIAL CARE FACTORS FREQUENCY  PT (By licensed PT), OT (By licensed OT)     PT Frequency: 5 OT  Frequency: 5            Contractures      Additional Factors Info  Code Status, Allergies Code Status Info: Prior  Allergies Info: NKA            Current Medications (09/24/2016):  This is the current hospital active medication list Current Facility-Administered Medications  Medication Dose Route Frequency Provider Last Rate Last Dose  . busPIRone (BUSPAR) tablet 15 mg  15 mg Oral BID Geoffery Lyons, MD   15 mg at 09/23/16 2151  . carbidopa-levodopa (SINEMET IR) 25-100 MG per tablet immediate release 1 tablet  1 tablet Oral BID WC Delo, Douglas, MD      . mirtazapine (REMERON) tablet 15 mg  15 mg Oral QHS Geoffery Lyons, MD   15 mg at 09/23/16 2152  . Strawberry Ensure Enlive Liquid  237 mL Oral BID BM Geoffery Lyons, MD       Current Outpatient Prescriptions  Medication Sig Dispense Refill  . busPIRone (BUSPAR) 15 MG tablet Take 15 mg by mouth 2 (two) times daily.    . carbidopa-levodopa (SINEMET IR) 25-100 MG tablet Take 1 tablet by mouth 2 (two) times daily. 180 tablet 3  . feeding supplement, ENSURE ENLIVE, (ENSURE ENLIVE) LIQD Take 237 mLs by mouth 2 (two) times daily between meals. 237 mL 12  . mirtazapine (REMERON) 15 MG tablet Take 15 mg by mouth at bedtime.  Discharge Medications: Please see discharge summary for a list of discharge medications.  Relevant Imaging Results:  Relevant Lab Results:   Additional Information   SSN:  [id number]  Donnie Coffin, LCSW

## 2016-09-24 NOTE — ED Notes (Signed)
Bed: OI32 Expected date:  Expected time:  Means of arrival:  Comments: Held room 1

## 2016-09-25 ENCOUNTER — Encounter: Payer: Self-pay | Admitting: Internal Medicine

## 2016-09-25 ENCOUNTER — Non-Acute Institutional Stay (SKILLED_NURSING_FACILITY): Payer: Medicare Other | Admitting: Internal Medicine

## 2016-09-25 DIAGNOSIS — G20 Parkinson's disease: Secondary | ICD-10-CM

## 2016-09-25 DIAGNOSIS — W19XXXD Unspecified fall, subsequent encounter: Secondary | ICD-10-CM | POA: Diagnosis not present

## 2016-09-25 DIAGNOSIS — L98429 Non-pressure chronic ulcer of back with unspecified severity: Secondary | ICD-10-CM

## 2016-09-25 DIAGNOSIS — F0281 Dementia in other diseases classified elsewhere with behavioral disturbance: Secondary | ICD-10-CM | POA: Diagnosis not present

## 2016-09-25 DIAGNOSIS — F419 Anxiety disorder, unspecified: Secondary | ICD-10-CM

## 2016-09-25 DIAGNOSIS — R6889 Other general symptoms and signs: Secondary | ICD-10-CM | POA: Diagnosis not present

## 2016-09-25 DIAGNOSIS — F329 Major depressive disorder, single episode, unspecified: Secondary | ICD-10-CM

## 2016-09-25 DIAGNOSIS — F32A Depression, unspecified: Secondary | ICD-10-CM

## 2016-09-25 DIAGNOSIS — W19XXXA Unspecified fall, initial encounter: Secondary | ICD-10-CM | POA: Insufficient documentation

## 2016-09-25 NOTE — Progress Notes (Signed)
: Provider:  Randon Goldsmith. Lyn Hollingshead, MD Location:  [facility] Room Number: [phone] Place of Service:  SNF ([phone])  PCP: Clayborn Heron, MD Patient Care Team: Clayborn Heron, MD as PCP - General [facility])  Extended Emergency Contact Information Primary Emergency Contact: Adams,[name] Address: [address]           [place], [place] [address] [place] Home Phone: [phone] Relation: Daughter     Allergies: Patient has no known allergies.  Chief Complaint  Patient presents with  . New Admit To SNF    Admit to Facility    HPI: Patient is 82 y.o. female with Parkinson's disease who was admitted to skilled nursing facility on 09/24/16 from the ED at [facility]. Patient presented to the emergency department after a fall. According to the patient's daughter she had been weak all morning of presentation to the ED and was having difficulty ambulating. By the afternoon her legs would not work and she slipped to the floor injuring both knees daughter was also concerned the patient has strong foul-smelling urine and a sore place on her sacrum. She denied there been any fever or chills. She denied any chest pain shortness of breath or other complaint. Exacerbated by ambulation, relieved by rest. Patient has not tried anything for the symptoms of weakness. Patient had a CMP, CBC UA and i-STAT lactic acid in the emergency department and no abnormalities were found it was felt the patient did not meet criteria for admission to the hospital but that she was not going to be safe to go home either. Case management recommended skilled nursing facility and patient is admitted to [facility] on 8/20. While at skilled nursing facility patient will be treated for Parkinson's was Sinemet 25-100 , anxiety treated with BuSpar and depression treated with Remeron  Past Medical History:  Diagnosis Date  . Anxiety   . Depression   . Heart murmur   .  Parkinson disease [facility])     Past Surgical History:  Procedure Laterality Date  . denies surgical history      Allergies as of 09/25/2016   No Known Allergies     Medication List       Accurate as of 09/25/16 10:39 AM. Always use your most recent med list.          busPIRone 15 MG tablet Commonly known as:  BUSPAR Take 15 mg by mouth 2 (two) times daily.   carbidopa-levodopa 25-100 MG tablet Commonly known as:  SINEMET IR Take 1 tablet by mouth 2 (two) times daily.   feeding supplement (ENSURE ENLIVE) Liqd Take 237 mLs by mouth 2 (two) times daily between meals.   mirtazapine 15 MG tablet Commonly known as:  REMERON Take 15 mg by mouth at bedtime.       No orders of the defined types were placed in this encounter.   Immunization History  Administered Date(s) Administered  . Influenza,inj,Quad PF,6+ Mos 11/20/2015  . Pneumococcal Polysaccharide-23 11/20/2015    Social History  Substance Use Topics  . Smoking status: Former Smoker    Quit date: 03/04/1996  . Smokeless tobacco: Never Used  . Alcohol use No    Family history is UTO secondary to patient's dementia  Family History  Problem Relation Age of Onset  . Dementia Neg Hx   . Parkinsonism Neg Hx       Review of Systems  UTO secondary to dementia  Vitals:   09/25/16 0833  BP: (!) 90/52  Pulse: 72  Resp: 18  Temp: (!) 97.1 F (36.2 C)    SpO2 Readings from Last 1 Encounters:  09/24/16 98%   Body mass index is 19.57 kg/m.     Physical Exam  GENERAL APPEARANCE: Sleepy, minimally conversant,  No acute distress. Initial blood pressure was in the 90s but one taken 10 minutes later systolic blood pressure was 115 SKIN: Unstageable pressure ulcer with eschar on sacrum; suspect is soft tissue injury bilateral heels HEAD: Normocephalic, atraumatic  EYES: Conjunctiva/lids clear. Pupils round, reactive. EOMs intact.  EARS: External exam WNL, canals clear. Hearing grossly normal.  NOSE:  No deformity or discharge.  MOUTH/THROAT: Lips w/o lesions  RESPIRATORY: Breathing is even, unlabored. Lung sounds are clear   CARDIOVASCULAR: Heart RRR no murmurs, rubs or gallops. No peripheral edema.   GASTROINTESTINAL: Abdomen is soft, non-tender, not distended w/ normal bowel sounds. GENITOURINARY: Bladder non tender, not distended  MUSCULOSKELETAL: No abnormal joints or musculature NEUROLOGIC:  Cranial nerves 2-12 grossly intact. Moves all extremities  PSYCHIATRIC: sleepy  Patient Active Problem List   Diagnosis Date Noted  . Dementia due to Parkinson's disease with behavioral disturbance (HCC) 12/19/2015  . Psychosis 12/19/2015  . AKI (acute kidney injury) (HCC) 11/22/2015  . Dehydration 11/22/2015  . UTI (urinary tract infection) 02/05/2015  . Parkinsonism (HCC) 01/25/2015  . Abnormality of gait   . Leukopenia 01/07/2015  . Anxiety 11/12/2014  . Malnutrition of moderate degree 11/10/2014  . Pressure ulcer stage II 11/09/2014  . Acute encephalopathy 08/02/2014  . Depression 05/02/2014      Labs reviewed: Basic Metabolic Panel:    Component Value Date/Time   NA 142 09/23/2016 1652   NA 141 11/20/2015   K 4.3 09/23/2016 1652   CL 106 09/23/2016 1652   CO2 27 09/23/2016 1652   GLUCOSE 95 09/23/2016 1652   BUN 24 (H) 09/23/2016 1652   BUN 17 11/20/2015   CREATININE 1.02 (H) 09/23/2016 1652   CALCIUM 9.0 09/23/2016 1652   PROT 7.2 09/23/2016 1652   ALBUMIN 3.4 (L) 09/23/2016 1652   AST 52 (H) 09/23/2016 1652   ALT 11 (L) 09/23/2016 1652   ALKPHOS 59 09/23/2016 1652   BILITOT 0.5 09/23/2016 1652   GFRNONAA 51 (L) 09/23/2016 1652   GFRAA 59 (L) 09/23/2016 1652     Recent Labs  11/18/15 2135 11/20/15 11/20/15 0351 09/23/16 1652  NA 139 141 141 142  K 4.3  --  3.9 4.3  CL 107  --  112* 106  CO2 25  --  25 27  GLUCOSE 106*  --  89 95  BUN 25* 17 17 24*  CREATININE 1.13* 1.0 0.99 1.02*  CALCIUM 9.3  --  8.2* 9.0   Liver Function Tests:  Recent Labs   09/23/16 1652  AST 52*  ALT 11*  ALKPHOS 59  BILITOT 0.5  PROT 7.2  ALBUMIN 3.4*   No results for input(s): LIPASE, AMYLASE in the last 8760 hours. No results for input(s): AMMONIA in the last 8760 hours. CBC:  Recent Labs  11/18/15 11/18/15 2135 09/23/16 1652  WBC 7.5 7.5 6.8  NEUTROABS  --   --  4.4  HGB  --  13.4 13.2  HCT  --  40.0 40.9  MCV  --  87.1 89.5  PLT  --  273 266   Lipid No results for input(s): CHOL, HDL, LDLCALC, TRIG in the last 8760 hours.  Cardiac Enzymes: No  results for input(s): CKTOTAL, CKMB, CKMBINDEX, TROPONINI in the last 8760 hours. BNP: No results for input(s): BNP in the last 8760 hours. No results found for: [facility] Lab Results  Component Value Date   HGBA1C 5.5 01/07/2015   Lab Results  Component Value Date   TSH 1.682 01/06/2015   Lab Results  Component Value Date   VITAMINB12 453 08/02/2014   Lab Results  Component Value Date   FOLATE 17.2 08/02/2014   No results found for: IRON, TIBC, FERRITIN  Imaging and Procedures obtained prior to SNF admission: Dg Pelvis 1-2 Views  Result Date: 09/23/2016 CLINICAL DATA:  Pain after fall. EXAM: PELVIS - 1-2 VIEW COMPARISON:  None. FINDINGS: There is no evidence of pelvic fracture or diastasis. No pelvic bone lesions are seen. IMPRESSION: Negative. Electronically Signed   By: Gerome Sam III M.D   On: 09/23/2016 16:55   Dg Knee Complete 4 Views Left  Result Date: 09/23/2016 CLINICAL DATA:  Pain after fall. EXAM: LEFT KNEE - COMPLETE 4+ VIEW COMPARISON:  None. FINDINGS: There is a small joint effusion. Mild tricompartmental degenerative changes. No acute fracture is seen. IMPRESSION: Small joint effusion.  No identified fracture. Electronically Signed   By: Gerome Sam III M.D   On: 09/23/2016 16:54   Dg Knee Complete 4 Views Right  Result Date: 09/23/2016 CLINICAL DATA:  Pain after trauma. EXAM: RIGHT KNEE - COMPLETE 4+ VIEW COMPARISON:  None. FINDINGS: Mild tricompartmental  degenerative changes. No fracture or joint effusion identified. IMPRESSION: Mild degenerative changes. Electronically Signed   By: Gerome Sam III M.D   On: 09/23/2016 16:55     Not all labs, radiology exams or other studies done during hospitalization come through on my EPIC note; however they are reviewed by me.    Assessment and Plan  FALL/PARKINSON'S DISEASE/DEMENTIA DUE TO PARKINSON'S DISEASE WITH BEHAVIORS-patient was worked up in the emergency department with no acute injuries; however was felt like with her fall she was not stable to return to home SNF - admitted with generalized weakness for OT/PT; will continue Sinemet 25-100 milligrams 1 by mouth twice a day; patient is on no dementia specific meds since this is dementia due to Parkinson's  DEPRESSION SNF -does not appear unstable; plan to continue Remeron 15 mg by mouth daily at bedtime  ANXIETY SNF - patient does not appear anxious plan to continue BuSpar 15 mg twice a day  SACRAL ULCER UNSTAGEABLE/SUSPECTED DEEP TISSUE INJURY OF HEEL SNF - wound care nurse to assess and follow   Time spent greater than 45 minutes;> 50% of time with patient was spent reviewing records, labs, tests and studies, counseling and developing plan of care  Thurston Hole D. Lyn Hollingshead, MD

## 2016-09-28 ENCOUNTER — Other Ambulatory Visit: Payer: Self-pay

## 2016-09-28 LAB — BASIC METABOLIC PANEL
BUN: 18 (ref 4–21)
Creatinine: 0.9 (ref 0.5–1.1)
Glucose: 85
Potassium: 4.8 (ref 3.4–5.3)
Sodium: 143 (ref 137–147)

## 2016-09-28 LAB — CBC AND DIFFERENTIAL
HEMATOCRIT: 38 (ref 36–46)
HEMOGLOBIN: 12 (ref 12.0–16.0)
PLATELETS: 291 (ref 150–399)
WBC: 4

## 2016-10-05 ENCOUNTER — Encounter (HOSPITAL_BASED_OUTPATIENT_CLINIC_OR_DEPARTMENT_OTHER): Payer: Medicare Other | Attending: Internal Medicine

## 2016-10-30 ENCOUNTER — Encounter: Payer: Self-pay | Admitting: Internal Medicine

## 2016-10-30 ENCOUNTER — Non-Acute Institutional Stay (SKILLED_NURSING_FACILITY): Payer: Medicare Other | Admitting: Internal Medicine

## 2016-10-30 DIAGNOSIS — F419 Anxiety disorder, unspecified: Secondary | ICD-10-CM

## 2016-10-30 DIAGNOSIS — F329 Major depressive disorder, single episode, unspecified: Secondary | ICD-10-CM

## 2016-10-30 DIAGNOSIS — F32A Depression, unspecified: Secondary | ICD-10-CM

## 2016-10-30 DIAGNOSIS — G20 Parkinson's disease: Secondary | ICD-10-CM

## 2016-10-30 DIAGNOSIS — W19XXXD Unspecified fall, subsequent encounter: Secondary | ICD-10-CM | POA: Diagnosis not present

## 2016-10-30 DIAGNOSIS — L98429 Non-pressure chronic ulcer of back with unspecified severity: Secondary | ICD-10-CM

## 2016-10-30 DIAGNOSIS — F0281 Dementia in other diseases classified elsewhere with behavioral disturbance: Secondary | ICD-10-CM

## 2016-10-30 NOTE — Progress Notes (Signed)
Location:  [facility] Room Number: [phone] Place of Service:  SNF [phone])  Provider: Armanda Heritage. Lyn Hollingshead, MD  PCP: Clayborn Heron, MD Patient Care Team: Clayborn Heron, MD as PCP - General [facility])  Extended Emergency Contact Information Primary Emergency Contact: Adams,[name] Address: [address]           [place], [place] [address] [place] Home Phone: [phone] Relation: Daughter  No Known Allergies  Chief Complaint  Patient presents with  . Discharge Note    discharge from SNF to home    HPI:  82 y.o. female  with Parkinson's disease who was admitted to skilled nursing facility on 09/24/16 from the ED at [facility]. Patient had presented to the emergency department after a fall. According to the patient's daughter she only recall morning of presentation and had had difficulty ambulating. By the afternoon her legs do not work and she slept on the floor injuring both knees. There was also concern the patient has strong foul-smelling urine and a sore place on her sacrum. She denies a patient had any fever or chills, chest pain, shortness of breath, or other complaint. Symptoms were exacerbated by ambulation and relieved by rest. Patient completed work up done in the emergency department that did not have any abnormality requiring admission, however she has not felt safe to go home either. At this point case management recommended skilled nursing facility and information was admitted to [facility] on 8/20 with generalized weakness for OT/PT. Patient is now ready to be discharged home    Past Medical History:  Diagnosis Date  . Anxiety   . Depression   . Heart murmur   . Parkinson disease [facility])     Past Surgical History:  Procedure Laterality Date  . denies surgical history       reports that she quit smoking about 20 years ago. She has never used smokeless tobacco. She reports that she does not drink alcohol  or use drugs. Social History   Social History  . Marital status: Divorced    Spouse name: N/A  . Number of children: 4  . Years of education: 10   Occupational History  . Not on file.   Social History Main Topics  . Smoking status: Former Smoker    Quit date: 03/04/1996  . Smokeless tobacco: Never Used  . Alcohol use No  . Drug use: No  . Sexual activity: No   Other Topics Concern  . Not on file   Social History Narrative   Lives at home with daughter   Caffeine use: none    Pertinent  Health Maintenance Due  Topic Date Due  . DEXA SCAN  09/18/2001  . INFLUENZA VACCINE  09/05/2016  . PNA vac Low Risk Adult (2 of 2 - PCV13) 11/19/2016    Medications: Allergies as of 10/30/2016   No Known Allergies     Medication List       Accurate as of 10/30/16  1:50 PM. Always use your most recent med list.          busPIRone 15 MG tablet Commonly known as:  BUSPAR Take 15 mg by mouth 2 (two) times daily.   carbidopa-levodopa 25-100 MG tablet Commonly known as:  SINEMET IR Take 1 tablet by mouth 2 (two) times daily.   ENSURE Take 237 mLs by mouth. One can three time daily 2/2 promote wound healing and support weight status  MIDODRINE HCL PO Take 10 mg by mouth. Take one tablet twice daily for low blood pressure   mirtazapine 15 MG tablet Commonly known as:  REMERON Take 15 mg by mouth at bedtime.   PROSTATE PO Take by mouth. 30 ml twice daily to aide in wound healing   vitamin C 500 MG tablet Commonly known as:  ASCORBIC ACID Take 500 mg by mouth. Take one tablet twice daily        Vitals:   10/30/16 0935  BP: 138/69  Pulse: 75  Resp: 18  Temp: 97.9 F (36.6 C)  Weight: 108 lb (49 kg)  Height:  (1.626 m)   Body mass index is 18.54 kg/m.  Physical Exam  GENERAL APPEARANCE: Alert, No acute distress.  HEENT: Unremarkable. RESPIRATORY: Breathing is even, unlabored. Lung sounds are clear   CARDIOVASCULAR: Heart RRR no murmurs, rubs or  gallops. No peripheral edema.  GASTROINTESTINAL: Abdomen is soft, non-tender, not distended w/ normal bowel sounds.  NEUROLOGIC: Cranial nerves 2-12 grossly intact. Moves all extremities   Labs reviewed: Basic Metabolic Panel:  Recent Labs  16/10/96 2135  11/20/15 0351 09/23/16 1652 09/28/16  NA 139  < > 141 142 143  K 4.3  --  3.9 4.3 4.8  CL 107  --  112* 106  --   CO2 25  --  25 27  --   GLUCOSE 106*  --  89 95  --   BUN 25*  < > 17 24* 18  CREATININE 1.13*  < > 0.99 1.02* 0.9  CALCIUM 9.3  --  8.2* 9.0  --   < > = values in this interval not displayed. No results found for: [facility] Liver Function Tests:  Recent Labs  09/23/16 1652  AST 52*  ALT 11*  ALKPHOS 59  BILITOT 0.5  PROT 7.2  ALBUMIN 3.4*   No results for input(s): LIPASE, AMYLASE in the last 8760 hours. No results for input(s): AMMONIA in the last 8760 hours. CBC:  Recent Labs  11/18/15 2135 09/23/16 1652 09/28/16  WBC 7.5 6.8 4.0  NEUTROABS  --  4.4  --   HGB 13.4 13.2 12.0  HCT 40.0 40.9 38  MCV 87.1 89.5  --   PLT 273 266 291   Lipid No results for input(s): CHOL, HDL, LDLCALC, TRIG in the last 8760 hours. Cardiac Enzymes: No results for input(s): CKTOTAL, CKMB, CKMBINDEX, TROPONINI in the last 8760 hours. BNP: No results for input(s): BNP in the last 8760 hours. CBG: No results for input(s): GLUCAP in the last 8760 hours.  Procedures and Imaging Studies During Stay: No results found.  Assessment/Plan:   Parkinson disease (HCC)  Dementia due to Parkinson's disease with behavioral disturbance (HCC)  Sacral ulcer, with unspecified severity (HCC)  Depression, unspecified depression type  Anxiety  Fall, subsequent encounter   Patient is being discharged with the following home health services:  OT/PT/nursing  Patient is being discharged with the following durable medical equipment:  None  Patient has been advised to f/u with their PCP in 1-2 weeks to bring them up to date  on their rehab stay.  Social services at facility was responsible for arranging this appointment.  Pt was provided with a 30 day supply of prescriptions for medications and refills must be obtained from their PCP.  For controlled substances, a more limited supply may be provided adequate until PCP appointment only.  Medications have been reconciled.  Time spent greater than 30 minutes;> 50% of  time with patient was spent reviewing records, labs, tests and studies, counseling and developing plan of care  Noah Delaine. Sheppard Coil, MD

## 2017-01-13 ENCOUNTER — Telehealth: Payer: Self-pay | Admitting: *Deleted

## 2017-01-13 NOTE — Telephone Encounter (Signed)
Attempted to call patient for a third time to inform her of the office closure tomorrow 12/10. Unable to leave a message due to voice mail box remaining full.

## 2017-01-13 NOTE — Telephone Encounter (Signed)
Attempted to call patient regarding appt. Voicemail box full, unable to leave message (per DPR can leave detailed VM).

## 2017-01-14 ENCOUNTER — Ambulatory Visit: Payer: Medicare Other | Admitting: Neurology

## 2017-01-16 NOTE — Telephone Encounter (Signed)
Called to reschedule, spoke with her daughter ([place] She wants to wait until weather clears before rescheduling. I advised if patient is doing well, that is fine. [facility]

## 2017-01-27 ENCOUNTER — Other Ambulatory Visit: Payer: Self-pay | Admitting: Internal Medicine

## 2017-02-05 DIAGNOSIS — K047 Periapical abscess without sinus: Secondary | ICD-10-CM

## 2017-02-05 HISTORY — DX: Periapical abscess without sinus: K04.7

## 2017-04-01 ENCOUNTER — Other Ambulatory Visit: Payer: Self-pay | Admitting: Internal Medicine

## 2017-04-09 ENCOUNTER — Other Ambulatory Visit: Payer: Self-pay | Admitting: Internal Medicine

## 2017-06-14 ENCOUNTER — Other Ambulatory Visit: Payer: Self-pay | Admitting: Neurology

## 2017-07-08 ENCOUNTER — Encounter (HOSPITAL_COMMUNITY): Payer: Self-pay | Admitting: Emergency Medicine

## 2017-07-08 ENCOUNTER — Other Ambulatory Visit: Payer: Self-pay

## 2017-07-08 ENCOUNTER — Emergency Department (HOSPITAL_COMMUNITY)
Admission: EM | Admit: 2017-07-08 | Discharge: 2017-07-09 | Disposition: A | Discharge status: Home / Self Care | Payer: Medicare Other | Attending: Emergency Medicine | Admitting: Emergency Medicine

## 2017-07-08 DIAGNOSIS — G20 Parkinson's disease: Secondary | ICD-10-CM | POA: Insufficient documentation

## 2017-07-08 DIAGNOSIS — F0280 Dementia in other diseases classified elsewhere without behavioral disturbance: Secondary | ICD-10-CM | POA: Diagnosis not present

## 2017-07-08 DIAGNOSIS — Z79899 Other long term (current) drug therapy: Secondary | ICD-10-CM | POA: Insufficient documentation

## 2017-07-08 DIAGNOSIS — K222 Esophageal obstruction: Secondary | ICD-10-CM | POA: Diagnosis not present

## 2017-07-08 DIAGNOSIS — R1310 Dysphagia, unspecified: Secondary | ICD-10-CM | POA: Diagnosis present

## 2017-07-08 DIAGNOSIS — T18128A Food in esophagus causing other injury, initial encounter: Secondary | ICD-10-CM

## 2017-07-08 NOTE — ED Triage Notes (Signed)
Pt family reports that pt was having difficulty coughing up mucus and was having difficulty swallowing. Symptoms started around 11pm

## 2017-07-08 NOTE — ED Notes (Signed)
Pt's family states that they thought she was choking earlier, pt is able to speak Pt is unable to clear her throat

## 2017-07-09 LAB — BASIC METABOLIC PANEL
Anion gap: 12 (ref 5–15)
BUN: 31 mg/dL — AB (ref 6–20)
CHLORIDE: 104 mmol/L (ref 101–111)
CO2: 29 mmol/L (ref 22–32)
CREATININE: 0.98 mg/dL (ref 0.44–1.00)
Calcium: 9.7 mg/dL (ref 8.9–10.3)
GFR calc Af Amer: >60 mL/min (ref >60)
GFR calc non Af Amer: 53 mL/min — ABNORMAL LOW (ref >60)
GLUCOSE: 95 mg/dL (ref 65–99)
Potassium: 4.6 mmol/L (ref 3.5–5.1)
Sodium: 145 mmol/L (ref 135–145)

## 2017-07-09 LAB — CBC WITH DIFFERENTIAL/PLATELET
Basophils Absolute: 0 10*3/uL (ref 0.0–0.1)
Basophils Relative: 0 %
Eosinophils Absolute: 0.3 10*3/uL (ref 0.0–0.7)
Eosinophils Relative: 5 %
HEMATOCRIT: 46.3 % — AB (ref 36.0–46.0)
HEMOGLOBIN: 14.7 g/dL (ref 12.0–15.0)
LYMPHS ABS: 2.4 10*3/uL (ref 0.7–4.0)
LYMPHS PCT: 45 %
MCH: 29.4 pg (ref 26.0–34.0)
MCHC: 31.7 g/dL (ref 30.0–36.0)
MCV: 92.6 fL (ref 78.0–100.0)
MONO ABS: 0.7 10*3/uL (ref 0.1–1.0)
MONOS PCT: 13 %
NEUTROS ABS: 2 10*3/uL (ref 1.7–7.7)
NEUTROS PCT: 37 %
Platelets: 300 10*3/uL (ref 150–400)
RBC: 5 MIL/uL (ref 3.87–5.11)
RDW: 13.3 % (ref 11.5–15.5)
WBC: 5.4 10*3/uL (ref 4.0–10.5)

## 2017-07-09 MED ORDER — PANTOPRAZOLE SODIUM 40 MG PO TBEC: 40.0000 mg | DELAYED_RELEASE_TABLET | Freq: Every day | ORAL | 0 refills | Status: DC

## 2017-07-09 MED ORDER — GLUCAGON HCL RDNA (DIAGNOSTIC) 1 MG IJ SOLR
1.0000 mg | Freq: Once | INTRAMUSCULAR | Status: AC
  Administered 2017-07-09: 1 mg via INTRAVENOUS
  Filled 2017-07-09: qty 1

## 2017-07-09 NOTE — Discharge Instructions (Addendum)
Try to avoid meat, chicken, fish until you see the gastroenterologist.

## 2017-07-09 NOTE — ED Provider Notes (Signed)
[facility]-EMERGENCY DEPT Provider Note   CSN: [id number] Arrival date & time: 07/08/17  2314     History   Chief Complaint Chief Complaint  Patient presents with  . Dysphagia    HPI Susan Simmons is a 83 y.o. female.  The history is provided by a caregiver. The history is limited by the condition of the patient (Dementia).  She has history of Parkinson's disease and was brought in because of difficulty swallowing.  She had eaten a dinner of spaghetti and meatballs at about 6 PM.  At about 11 PM, caregiver heard her choking.  She did not not seem to be coughing.  She was spitting out her saliva.  Caregiver tried to get her to swallow some water and thinks that some did go down.  However, she continues to have to spit saliva.  Patient is not able to give any reliable history.  Past Medical History:  Diagnosis Date  . Anxiety   . Depression   . Heart murmur   . Parkinson disease [facility])     Patient Active Problem List   Diagnosis Date Noted  . Fall 09/25/2016  . Sacral ulcer, with unspecified severity (HCC) 09/25/2016  . Suspected deep tissue injury of unknown depth of heel 09/25/2016  . Dementia due to Parkinson's disease with behavioral disturbance (HCC) 12/19/2015  . Psychosis (HCC) 12/19/2015  . AKI (acute kidney injury) (HCC) 11/22/2015  . Dehydration 11/22/2015  . UTI (urinary tract infection) 02/05/2015  . Parkinsonism (HCC) 01/25/2015  . Abnormality of gait   . Leukopenia 01/07/2015  . Anxiety 11/12/2014  . Malnutrition of moderate degree 11/10/2014  . Pressure ulcer stage II 11/09/2014  . Acute encephalopathy 08/02/2014  . Depression 05/02/2014    Past Surgical History:  Procedure Laterality Date  . denies surgical history       OB History   None      Home Medications    Prior to Admission medications   Medication Sig Start Date End Date Taking? Authorizing Provider  busPIRone (BUSPAR) 15 MG tablet Take 15 mg by mouth daily.     Yes [provider]  carbidopa-levodopa (SINEMET IR) 25-100 MG tablet Take 1 tablet by mouth 2 (two) times daily. Patient taking differently: Take 1 tablet by mouth daily.  07/16/16  Yes [place], Aundra Millet, NP  ENSURE (ENSURE) Take 237 mLs by mouth 3 (three) times daily between meals. One can three times daily to promote wound healing and support weight status   Yes [provider]  mirtazapine (REMERON) 15 MG tablet Take 15 mg by mouth at bedtime.   Yes [provider]  vitamin C (ASCORBIC ACID) 500 MG tablet Take 500 mg by mouth daily. Take one tablet twice daily    Yes [provider]    Family History Family History  Problem Relation Age of Onset  . Dementia Neg Hx   . Parkinsonism Neg Hx     Social History Social History   Tobacco Use  . Smoking status: Former Smoker    Last attempt to quit: 03/04/1996    Years since quitting: 21.3  . Smokeless tobacco: Never Used  Substance Use Topics  . Alcohol use: No  . Drug use: No     Allergies   Patient has no known allergies.   Review of Systems Review of Systems  Unable to perform ROS: Dementia     Physical Exam Updated Vital Signs BP 124/60 (BP Location: Right Arm)   Pulse 76  Temp (!) 97.4 F (36.3 C) (Oral)   Resp 16   Ht 5\' 4"  (1.626 m)   Wt 47.6 kg (105 lb)   SpO2 100%   BMI 18.02 kg/m   Physical Exam  Nursing note and vitals reviewed.  83 year old female, resting comfortably and in no acute distress. Vital signs are normal. Oxygen saturation is 100%, which is normal. Head is normocephalic and atraumatic. PERRLA, EOMI. Oropharynx is clear.  Pooling of secretions is noted in the mouth.  Voice is normal. Neck is nontender and supple without adenopathy or JVD. Back is nontender and there is no CVA tenderness. Lungs are clear without rales, wheezes, or rhonchi. Chest is nontender. Heart has regular rate and rhythm without murmur. Abdomen is soft, flat, nontender without  masses or hepatosplenomegaly and peristalsis is normoactive. Extremities have no cyanosis or edema, full range of motion is present. Skin is warm and dry without rash. Neurologic: Flat affect, masklike facies, minimally verbal, cranial nerves are intact, there are no motor or sensory deficits.  Moderate cogwheel rigidity noted.  ED Treatments / Results  Labs (all labs ordered are listed, but only abnormal results are displayed) Labs Reviewed  CBC WITH DIFFERENTIAL/PLATELET - Abnormal; Notable for the following components:      Result Value   HCT 46.3 (*)    All other components within normal limits  BASIC METABOLIC PANEL - Abnormal; Notable for the following components:   BUN 31 (*)    GFR calc non Af Amer 53 (*)    All other components within normal limits    Procedures Procedures   Medications Ordered in ED Medications  glucagon (human recombinant) (GLUCAGEN) injection 1 mg (1 mg Intravenous Given 07/09/17 0104)     Initial Impression / Assessment and Plan / ED Course  I have reviewed the triage vital signs and the nursing notes.  Pertinent lab results that were available during my care of the patient were reviewed by me and considered in my medical decision making (see chart for details).  Difficulty swallowing which I suspect is an esophageal food impaction.  Old records are reviewed, and she has no relevant past visits.  We will give therapeutic trial of glucagon.  1:39 AM She seems to be doing better after glucagon.  We will give oral fluid challenge.  2:45 AM She has completed the fluid challenge without any problems.  No further spitting up of saliva.  She will be discharged with prescription for pantoprazole, referred to GI for outpatient endoscopy and consideration for esophageal dilatation.  Final Clinical Impressions(s) / ED Diagnoses   Final diagnoses:  Esophageal obstruction due to food impaction    ED Discharge Orders        Ordered    pantoprazole  (PROTONIX) 40 MG tablet  Daily     07/09/17 0240       Dione BoozeGlick, Shaquel Chavous, MD 07/09/17 0246

## 2017-07-09 NOTE — ED Notes (Signed)
Pt able to drink water  

## 2017-08-12 ENCOUNTER — Other Ambulatory Visit: Payer: Self-pay | Admitting: Internal Medicine

## 2017-08-16 ENCOUNTER — Other Ambulatory Visit: Payer: Self-pay | Admitting: Neurology

## 2017-08-16 MED ORDER — CARBIDOPA-LEVODOPA 25-100 MG PO TABS: 1.0000 | ORAL_TABLET | Freq: Two times a day (BID) | ORAL | 0 refills | Status: DC

## 2017-08-16 NOTE — Telephone Encounter (Signed)
Pt called requesting a refill for carbidopa-levodopa (SINEMET IR) 25-100 MG tablet sent to [facility]. Stating she is currently out

## 2017-08-16 NOTE — Telephone Encounter (Signed)
Sent the medication to the pharmacy for the patient. She must keep upcoming apts to continue to get refills.

## 2017-09-18 ENCOUNTER — Ambulatory Visit: Payer: Medicare Other | Admitting: Neurology

## 2017-09-18 ENCOUNTER — Encounter

## 2017-09-18 ENCOUNTER — Encounter: Payer: Self-pay | Admitting: Neurology

## 2017-09-18 VITALS — BP 154/76 | HR 98 | Ht 64.0 in | Wt 107.0 lb

## 2017-09-18 DIAGNOSIS — F0281 Dementia in other diseases classified elsewhere with behavioral disturbance: Secondary | ICD-10-CM

## 2017-09-18 DIAGNOSIS — G20 Parkinson's disease: Secondary | ICD-10-CM | POA: Diagnosis not present

## 2017-09-18 MED ORDER — DONEPEZIL HCL 5 MG PO TABS: 5.0000 mg | ORAL_TABLET | Freq: Every day | ORAL | 11 refills | Status: DC

## 2017-09-18 NOTE — Patient Instructions (Signed)
Start Aricept for cognitive decline, start Aricept 5mg . Call in a month and may increase to 10mg   Donepezil tablets What is this medicine? DONEPEZIL (doe NEP e zil) is used to treat mild to moderate dementia caused by Alzheimer's disease. This medicine may be used for other purposes; ask your health care provider or pharmacist if you have questions. COMMON BRAND NAME(S): Aricept What should I tell my health care provider before I take this medicine? They need to know if you have any of these conditions: -asthma or other lung disease -difficulty passing urine -head injury -heart disease -history of irregular heartbeat -liver disease -seizures (convulsions) -stomach or intestinal disease, ulcers or stomach bleeding -an unusual or allergic reaction to donepezil, other medicines, foods, dyes, or preservatives -pregnant or trying to get pregnant -breast-feeding How should I use this medicine? Take this medicine by mouth with a glass of water. Follow the directions on the prescription label. You may take this medicine with or without food. Take this medicine at regular intervals. This medicine is usually taken before bedtime. Do not take it more often than directed. Continue to take your medicine even if you feel better. Do not stop taking except on your doctor's advice. If you are taking the 23 mg donepezil tablet, swallow it whole; do not cut, crush, or chew it. Talk to your pediatrician regarding the use of this medicine in children. Special care may be needed. Overdosage: If you think you have taken too much of this medicine contact a poison control center or emergency room at once. NOTE: This medicine is only for you. Do not share this medicine with others. What if I miss a dose? If you miss a dose, take it as soon as you can. If it is almost time for your next dose, take only that dose, do not take double or extra doses. What may interact with this medicine? Do not take this medicine  with any of the following medications: -certain medicines for fungal infections like itraconazole, fluconazole, posaconazole, and voriconazole -cisapride -dextromethorphan; quinidine -dofetilide -dronedarone -pimozide -quinidine -thioridazine -ziprasidone This medicine may also interact with the following medications: -antihistamines for allergy, cough and cold -atropine -bethanechol -carbamazepine -certain medicines for bladder problems like oxybutynin, tolterodine -certain medicines for Parkinson's disease like benztropine, trihexyphenidyl -certain medicines for stomach problems like dicyclomine, hyoscyamine -certain medicines for travel sickness like scopolamine -dexamethasone -ipratropium -NSAIDs, medicines for pain and inflammation, like ibuprofen or naproxen -other medicines for Alzheimer's disease -other medicines that prolong the QT interval (cause an abnormal heart rhythm) -phenobarbital -phenytoin -rifampin, rifabutin or rifapentine This list may not describe all possible interactions. Give your health care provider a list of all the medicines, herbs, non-prescription drugs, or dietary supplements you use. Also tell them if you smoke, drink alcohol, or use illegal drugs. Some items may interact with your medicine. What should I watch for while using this medicine? Visit your doctor or health care professional for regular checks on your progress. Check with your doctor or health care professional if your symptoms do not get better or if they get worse. You may get drowsy or dizzy. Do not drive, use machinery, or do anything that needs mental alertness until you know how this drug affects you. What side effects may I notice from receiving this medicine? Side effects that you should report to your doctor or health care professional as soon as possible: -allergic reactions like skin rash, itching or hives, swelling of the face, lips, or tongue -feeling faint or lightheaded,  falls -loss of bladder control -seizures -signs and symptoms of a dangerous change in heartbeat or heart rhythm like chest pain; dizziness; fast or irregular heartbeat; palpitations; feeling faint or lightheaded, falls; breathing problems -signs and symptoms of infection like fever or chills; cough; sore throat; pain or trouble passing urine -signs and symptoms of liver injury like dark yellow or brown urine; general ill feeling or flu-like symptoms; light-colored stools; loss of appetite; nausea; right upper belly pain; unusually weak or tired; yellowing of the eyes or skin -slow heartbeat or palpitations -unusual bleeding or bruising -vomiting Side effects that usually do not require medical attention (report to your doctor or health care professional if they continue or are bothersome): -diarrhea, especially when starting treatment -headache -loss of appetite -muscle cramps -nausea -stomach upset This list may not describe all possible side effects. Call your doctor for medical advice about side effects. You may report side effects to FDA at 1-800-FDA-1088. Where should I keep my medicine? Keep out of reach of children. Store at room temperature between 15 and 30 degrees C (59 and 86 degrees F). Throw away any unused medicine after the expiration date. NOTE: This sheet is a summary. It may not cover all possible information. If you have questions about this medicine, talk to your doctor, pharmacist, or health care provider.  2018 Elsevier/Gold Standard (2015-07-11 21:00:42)

## 2017-09-18 NOTE — Progress Notes (Signed)
[facility]    Provider:  Dr Susan Simmons Referring Provider: Clayborn Heronankins, [name], MD Primary Care Physician:  Susan Heronankins, [name], MD  CC: Parkinsonism, Parkinson's disease dementia  09/18/2017: Susan GasserShirley Simmons is a 83 y.o. female here as a referral from Dr. Barbaraann Simmons for Parkinsonism. Unclear if this is idiopathic Parkinson's Disease or Parkinson's variant. She is on Sinemet and reported response in the past. She has had infection and is being treated. Cognitive decline. She only takes 1 sinemet IR tab 2x daily. Did not tolerate ER in the past with significant confusion.  Also currently pressure ulcerations on sacrum and heels [facility]. She has had difficulty swallowing and was seen in the ED for this in June with recommendations for GI for outpatient endoscopy and possibly esophageal dilation (suspected esophageal food impaction). She is very anxious today which is unusual, but being treated for infection. They never went to GI, daughter says she is doing well without anymore problems, no choking, no coughing, no dysphagia at all. No dyskinesias. She has had mental decline ill start Aricept. No falls. She gives Sinemet but can cause confusion so takes only 1, 1-2x a day.    Interval history 04/25/2015: She can get up on her own. They are walking every day. Daughter says she is walking better, can ambulate on her own. She has gained weight. PT is coming to the home. ere with lovely daughter who provides most information. She can get out of bed alone. Daughter helps minimally, just being around her so she doesn't fall. She last took her sinemet anout 8:30-9am. She has told her therapist not to tell daughter how well she is doing so that she doesn't have to do it on her own at home. Gaining weight. She is not having swallowing problems. Sometimes she is a little forgetful but maybe a little bit of memory loss.    Interval history 01/25/2015: She is taking Sinemet IR  twice daily. 8:30am and 5pm. She did not want to take it 3 times a day even though she was prescribed inpatient. She does not want to take the CR overnight. She does not want to take the amantadine 100mg  bid she was prescribed. No dyskinesias. Will increase Sinemet to 3x a day. They decline the amantadine and CR at night. She gets increased tremor about 1pm which is likely when the Sinemet may be wearing off. Freezing less now. Walking a little better but still significant shuffling and balance problems. No dizziness. No drooling. Her voice is hypophonic. She is getting physical therapy at home. No swallowing problems, no choking. Appetite is excellent. She has gained weight. No constipation.    Interval history 11/01/2014: Things are getting worse. Worse with walking. More shuffling. No tremor. Voice not hypophonic, She was in rehab for 6 weeks after UTI with encephalopathy. Not acting out dreams or having vivid dreams. She has some drooling. She has difficulty getting out of a chair, won't get up on her own, won't stand on her own. Maybe some hallucinations, she will see family members that are not there. She has urinary incontinence. She has depression and mood changes. Some days her memory is excellent, other days she seems demented. Patient reports she feels she internally shaking.   HPI: Susan Simmons is a 83 y.o. female here as a referral from Dr. Barbaraann Simmons for   For 5 years, she has had bad depression since her husband passed. She laid in bed for 5 years, stopped doing everything, then  had muscle disuse. Things are better now. She has moved in with her daughter. Daughter provides all information. She has been to physical therapy, been to inpatient PT, daughter works every day to keep up her strength, she goes to adult daycare. Patient still has decreased interest, decreased social interest. She can walk normal with encouragement. No falls, instability, Mother says she has tremors, "like I am  scared". Daughter says she she has anxiety. Patient says she notice it when using the hand, like when dressing. Decreased concentration.She heasitates, says she is "scared to get up". Symptoms have improved since she first moved in with daughter, and some days she can even "walk normally". She has been on 14 different medications. Unclear use of dopamine blocking agents. Severe worsening depression, refractory.  Review of Systems: Patient complains of symptoms per HPI as well as the following symptoms: No CP, No SOB. Pertinent negatives per HPI. All others negative.   Social History   Socioeconomic History  . Marital status: Divorced    Spouse name: Not on file  . Number of children: 4  . Years of education: 5811  . Highest education level: Not on file  Occupational History  . Not on file  Social Needs  . Financial resource strain: Not on file  . Food insecurity:    Worry: Not on file    Inability: Not on file  . Transportation needs:    Medical: Not on file    Non-medical: Not on file  Tobacco Use  . Smoking status: Former Smoker    Last attempt to quit: 03/04/1996    Years since quitting: 21.5  . Smokeless tobacco: Never Used  Substance and Sexual Activity  . Alcohol use: No  . Drug use: No  . Sexual activity: Never  Lifestyle  . Physical activity:    Days per week: Not on file    Minutes per session: Not on file  . Stress: Not on file  Relationships  . Social connections:    Talks on phone: Not on file    Gets together: Not on file    Attends religious service: Not on file    Active member of club or organization: Not on file    Attends meetings of clubs or organizations: Not on file    Relationship status: Not on file  . Intimate partner violence:    Fear of current or ex partner: Not on file    Emotionally abused: Not on file    Physically abused: Not on file    Forced sexual activity: Not on file  Other Topics Concern  . Not on file  Social History Narrative    Lives at home with daughter   Caffeine use: none    Family History  Problem Relation Age of Onset  . Dementia Neg Hx   . Parkinsonism Neg Hx     Past Medical History:  Diagnosis Date  . Anxiety   . Depression   . Heart murmur   . Parkinson disease (HCC)   . Tooth infection 2019    Past Surgical History:  Procedure Laterality Date  . denies surgical history      Current Outpatient Medications  Medication Sig Dispense Refill  . amoxicillin (AMOXIL) 500 MG capsule Take 500 mg by mouth 4 (four) times daily.    . busPIRone (BUSPAR) 15 MG tablet Take 15 mg by mouth daily.     . carbidopa-levodopa (SINEMET IR) 25-100 MG tablet Take 1 tablet by mouth 2 (two)  times daily. 180 tablet 0  . ENSURE (ENSURE) Take 237 mLs by mouth 3 (three) times daily between meals. One can three times daily to promote wound healing and support weight status    . mirtazapine (REMERON) 15 MG tablet Take 15 mg by mouth at bedtime.    . vitamin C (ASCORBIC ACID) 500 MG tablet Take 500 mg by mouth daily. Take one tablet twice daily     . donepezil (ARICEPT) 5 MG tablet Take 1 tablet (5 mg total) by mouth at bedtime. 30 tablet 11   No current facility-administered medications for this visit.     Allergies as of 09/18/2017  . (No Known Allergies)    Vitals: BP (!) 154/76 (BP Location: Left Arm, Patient Position: Sitting)   Pulse 98   Ht 5\' 4"  (1.626 m)   Wt 107 lb (48.5 kg)   BMI 18.37 kg/m  Last Weight:  Wt Readings from Last 1 Encounters:  09/18/17 107 lb (48.5 kg)   Last Height:   Ht Readings from Last 1 Encounters:  09/18/17 5\' 4"  (1.626 m)   General: Very anxious today   Speech:  Not aphasic, not dysarthric Cognition:   The patient is oriented to person only  Cranial Nerves: Hypomimia  The pupils are equal, round, and reactive to light. Visual fields are full to finger confrontation. Impaired upgaze, otherwise extraocular movements are intact with saccades. Trigeminal sensation  is intact and the muscles of mastication are normal. The face is symmetric. The palate elevates in the midline. Hearing intact to voice. Voice is hypo. Shoulder shrug is normal. The tongue has normal motion without fasciculations.   Coordination:  significant bradykinesia  Gait:  significant Shuffling, with freezing, decreased arm swing with re-emergent right hand tremor  Motor Observation:  right hand resting tremor, course  Tone:  increased in the upper extremities with cogwheeling  Posture:  Posture is severely stooped   Strength:  Poor effort, anti-gravity, symmetric     Assessment/Plan: 83 y.o. female here as a referral from Dr. Barbaraann Barthel for Parkinsonism. Unclear if this is idiopathic Parkinson's Disease or Parkinson's variant.  Also Dementia due to PD. She has shuffling gait, decreased arm swing, decreased blink reflex, Upper extremity cogwheeling. She has responded to Sinemet IR but doesn't take often due to cognitive side effects. She lives at home with her lovelydaughter and has very excellent care.   - MRI of the brain was unremarkable.  -  Unclear if this is idiopathic Parkinson's Disease or Parkinson's variant - Continue Sinemet as tolerated - Start Aricept low dose for dementia - Denies dysphagia, coughing, falls - Cognition is declining, very anxious today in the office - Daughter is wonderful and provides excellent care  Cc: dr. Barbaraann Barthel  Meds ordered this encounter  Medications  . donepezil (ARICEPT) 5 MG tablet    Sig: Take 1 tablet (5 mg total) by mouth at bedtime.    Dispense:  30 tablet    Refill:  11    Naomie Dean, MD  [facility] [address] [place], [place] [address]  Phone [phone] Fax [phone]  A total of 15 minutes was spent face-to-face with this patient. Over half this time was spent on counseling patient on the  1. Parkinson's disease (HCC)   2. Dementia due to Parkinson's  disease with behavioral disturbance (HCC)    diagnosis and different diagnostic and therapeutic options available.

## 2017-11-01 ENCOUNTER — Telehealth: Payer: Self-pay | Admitting: *Deleted

## 2017-11-01 NOTE — Telephone Encounter (Signed)
Megan with [facility] Called and left message on clinical intake wanting orders for speech therapy. Stated that she had concerns with mucus and pocketing food.   I LMOM to Aundra Millet that she would need to call patient's PCP because we haven't seen this patient in over a year. Patient discharged from facility.

## 2018-01-13 ENCOUNTER — Other Ambulatory Visit: Payer: Self-pay | Admitting: *Deleted

## 2018-01-13 ENCOUNTER — Telehealth: Payer: Self-pay | Admitting: Neurology

## 2018-01-13 MED ORDER — DONEPEZIL HCL 10 MG PO TABS: 10.0000 mg | ORAL_TABLET | Freq: Every day | ORAL | 5 refills | Status: DC

## 2018-01-13 NOTE — Telephone Encounter (Signed)
Order for Aricept 10 mg placed.

## 2018-01-13 NOTE — Telephone Encounter (Signed)
Per Dr. Lucia GaskinsAhern, can increase Aricept to 10 mg at bedtime.

## 2018-01-13 NOTE — Telephone Encounter (Signed)
Pts daughter [name] requesting a call stating she would like to discuss rasing the dosing for medication donepezil (ARICEPT) 5 MG tablet stating the pt has taken a months supply with no s/e

## 2018-01-13 NOTE — Telephone Encounter (Signed)
Spoke with pt's daughter [name]. She stated pt is doing well on Aricept and would like to try to increase the dose. Advised [name] that Dr. Lucia GaskinsAhern said she can increase the Aricept to 10 mg daily at bedtime. We will send the prescription in. She was very Adult nurseappreciative. She was encouraged to call if she has any s/e, ex. Diarrhea and we can discuss if dosage needs change again. [name] was very Adult nurseappreciative.

## 2018-01-13 NOTE — Progress Notes (Signed)
Aricept 10 mg now. D/c Aricept 5 mg.

## 2018-02-25 ENCOUNTER — Other Ambulatory Visit: Payer: Self-pay | Admitting: *Deleted

## 2018-02-25 MED ORDER — CARBIDOPA-LEVODOPA 25-100 MG PO TABS: 1.0000 | ORAL_TABLET | Freq: Two times a day (BID) | ORAL | 1 refills | Status: DC

## 2018-03-02 ENCOUNTER — Inpatient Hospital Stay (HOSPITAL_COMMUNITY)
Admission: EM | Admit: 2018-03-02 | Discharge: 2018-03-10 | DRG: 643 | Disposition: A | Discharge status: Home Health | Payer: Medicare Other | Attending: Family Medicine | Admitting: Family Medicine

## 2018-03-02 ENCOUNTER — Other Ambulatory Visit: Payer: Self-pay

## 2018-03-02 ENCOUNTER — Emergency Department (HOSPITAL_COMMUNITY): Payer: Medicare Other

## 2018-03-02 ENCOUNTER — Encounter (HOSPITAL_COMMUNITY): Payer: Self-pay

## 2018-03-02 DIAGNOSIS — J189 Pneumonia, unspecified organism: Secondary | ICD-10-CM

## 2018-03-02 DIAGNOSIS — R509 Fever, unspecified: Secondary | ICD-10-CM

## 2018-03-02 DIAGNOSIS — E049 Nontoxic goiter, unspecified: Principal | ICD-10-CM | POA: Diagnosis present

## 2018-03-02 DIAGNOSIS — F329 Major depressive disorder, single episode, unspecified: Secondary | ICD-10-CM | POA: Diagnosis present

## 2018-03-02 DIAGNOSIS — R680 Hypothermia, not associated with low environmental temperature: Secondary | ICD-10-CM | POA: Diagnosis present

## 2018-03-02 DIAGNOSIS — L89893 Pressure ulcer of other site, stage 3: Secondary | ICD-10-CM | POA: Diagnosis present

## 2018-03-02 DIAGNOSIS — Z681 Body mass index (BMI) 19 or less, adult: Secondary | ICD-10-CM

## 2018-03-02 DIAGNOSIS — R1310 Dysphagia, unspecified: Secondary | ICD-10-CM | POA: Diagnosis not present

## 2018-03-02 DIAGNOSIS — L98429 Non-pressure chronic ulcer of back with unspecified severity: Secondary | ICD-10-CM | POA: Diagnosis present

## 2018-03-02 DIAGNOSIS — F419 Anxiety disorder, unspecified: Secondary | ICD-10-CM | POA: Diagnosis present

## 2018-03-02 DIAGNOSIS — Z87891 Personal history of nicotine dependence: Secondary | ICD-10-CM

## 2018-03-02 DIAGNOSIS — Z79899 Other long term (current) drug therapy: Secondary | ICD-10-CM

## 2018-03-02 DIAGNOSIS — R4702 Dysphasia: Secondary | ICD-10-CM | POA: Diagnosis present

## 2018-03-02 DIAGNOSIS — L89112 Pressure ulcer of right upper back, stage 2: Secondary | ICD-10-CM | POA: Diagnosis present

## 2018-03-02 DIAGNOSIS — E162 Hypoglycemia, unspecified: Secondary | ICD-10-CM | POA: Diagnosis present

## 2018-03-02 DIAGNOSIS — G20 Parkinson's disease: Secondary | ICD-10-CM | POA: Diagnosis present

## 2018-03-02 DIAGNOSIS — F0281 Dementia in other diseases classified elsewhere with behavioral disturbance: Secondary | ICD-10-CM | POA: Diagnosis present

## 2018-03-02 DIAGNOSIS — G20A1 Parkinson's disease without dyskinesia, without mention of fluctuations: Secondary | ICD-10-CM | POA: Diagnosis present

## 2018-03-02 DIAGNOSIS — A419 Sepsis, unspecified organism: Secondary | ICD-10-CM | POA: Diagnosis not present

## 2018-03-02 DIAGNOSIS — J9859 Other diseases of mediastinum, not elsewhere classified: Secondary | ICD-10-CM | POA: Diagnosis present

## 2018-03-02 DIAGNOSIS — G9341 Metabolic encephalopathy: Secondary | ICD-10-CM | POA: Diagnosis present

## 2018-03-02 DIAGNOSIS — G9340 Encephalopathy, unspecified: Secondary | ICD-10-CM | POA: Diagnosis present

## 2018-03-02 DIAGNOSIS — F0280 Dementia in other diseases classified elsewhere without behavioral disturbance: Secondary | ICD-10-CM | POA: Diagnosis present

## 2018-03-02 DIAGNOSIS — L89153 Pressure ulcer of sacral region, stage 3: Secondary | ICD-10-CM | POA: Diagnosis present

## 2018-03-02 DIAGNOSIS — E440 Moderate protein-calorie malnutrition: Secondary | ICD-10-CM | POA: Diagnosis present

## 2018-03-02 DIAGNOSIS — L89120 Pressure ulcer of left upper back, unstageable: Secondary | ICD-10-CM | POA: Diagnosis present

## 2018-03-02 LAB — URINALYSIS, ROUTINE W REFLEX MICROSCOPIC
Bilirubin Urine: NEGATIVE
Glucose, UA: NEGATIVE mg/dL
Hgb urine dipstick: NEGATIVE
Ketones, ur: NEGATIVE mg/dL
Leukocytes, UA: NEGATIVE
Nitrite: NEGATIVE
PH: 6 (ref 5.0–8.0)
Protein, ur: NEGATIVE mg/dL
Specific Gravity, Urine: 1.003 — ABNORMAL LOW (ref 1.005–1.030)

## 2018-03-02 LAB — COMPREHENSIVE METABOLIC PANEL
ALT: 6 U/L (ref 0–44)
AST: 27 U/L (ref 15–41)
Albumin: 3.4 g/dL — ABNORMAL LOW (ref 3.5–5.0)
Alkaline Phosphatase: 57 U/L (ref 38–126)
Anion gap: 9 (ref 5–15)
BUN: 23 mg/dL (ref 8–23)
CALCIUM: 8.8 mg/dL — AB (ref 8.9–10.3)
CO2: 28 mmol/L (ref 22–32)
Chloride: 103 mmol/L (ref 98–111)
Creatinine, Ser: 0.81 mg/dL (ref 0.44–1.00)
GFR calc Af Amer: >60 mL/min (ref >60)
GFR calc non Af Amer: >60 mL/min (ref >60)
Glucose, Bld: 92 mg/dL (ref 70–99)
Potassium: 4 mmol/L (ref 3.5–5.1)
Sodium: 140 mmol/L (ref 135–145)
Total Bilirubin: 0.7 mg/dL (ref 0.3–1.2)
Total Protein: 7.3 g/dL (ref 6.5–8.1)

## 2018-03-02 LAB — CBC WITH DIFFERENTIAL/PLATELET
Abs Immature Granulocytes: 0.02 10*3/uL (ref 0.00–0.07)
Basophils Absolute: 0 10*3/uL (ref 0.0–0.1)
Basophils Relative: 1 %
EOS PCT: 3 %
Eosinophils Absolute: 0.2 10*3/uL (ref 0.0–0.5)
HCT: 44.4 % (ref 36.0–46.0)
Hemoglobin: 13.5 g/dL (ref 12.0–15.0)
Immature Granulocytes: 0 %
LYMPHS ABS: 2 10*3/uL (ref 0.7–4.0)
Lymphocytes Relative: 35 %
MCH: 29 pg (ref 26.0–34.0)
MCHC: 30.4 g/dL (ref 30.0–36.0)
MCV: 95.5 fL (ref 80.0–100.0)
Monocytes Absolute: 0.5 10*3/uL (ref 0.1–1.0)
Monocytes Relative: 8 %
NRBC: 0 % (ref 0.0–0.2)
Neutro Abs: 3.1 10*3/uL (ref 1.7–7.7)
Neutrophils Relative %: 53 %
Platelets: 246 10*3/uL (ref 150–400)
RBC: 4.65 MIL/uL (ref 3.87–5.11)
RDW: 13.3 % (ref 11.5–15.5)
WBC: 5.8 10*3/uL (ref 4.0–10.5)

## 2018-03-02 LAB — LACTIC ACID, PLASMA
LACTIC ACID, VENOUS: 1 mmol/L (ref 0.5–1.9)
Lactic Acid, Venous: 1.2 mmol/L (ref 0.5–1.9)

## 2018-03-02 LAB — CORTISOL: Cortisol, Plasma: 6.7 ug/dL

## 2018-03-02 LAB — TSH: TSH: 1 u[IU]/mL (ref 0.350–4.500)

## 2018-03-02 LAB — AMMONIA: Ammonia: 39 umol/L — ABNORMAL HIGH (ref 9–35)

## 2018-03-02 LAB — ETHANOL: Alcohol, Ethyl (B): <10 mg/dL (ref <10)

## 2018-03-02 MED ORDER — IOHEXOL 300 MG/ML  SOLN
75.0000 mL | Freq: Once | INTRAMUSCULAR | Status: AC | PRN
  Administered 2018-03-02: 75 mL via INTRAVENOUS

## 2018-03-02 MED ORDER — SODIUM CHLORIDE 0.9 % IV BOLUS
1000.0000 mL | Freq: Once | INTRAVENOUS | Status: AC
  Administered 2018-03-02: 1000 mL via INTRAVENOUS

## 2018-03-02 MED ORDER — SODIUM CHLORIDE 0.9% FLUSH
3.0000 mL | Freq: Two times a day (BID) | INTRAVENOUS | Status: DC
  Administered 2018-03-02 – 2018-03-10 (×10): 3 mL via INTRAVENOUS

## 2018-03-02 MED ORDER — ONDANSETRON HCL 4 MG/2ML IJ SOLN: 4.0000 mg | Freq: Four times a day (QID) | INTRAMUSCULAR | Status: DC | PRN

## 2018-03-02 MED ORDER — CARBIDOPA-LEVODOPA 25-100 MG PO TABS
1.0000 | ORAL_TABLET | Freq: Two times a day (BID) | ORAL | Status: DC
  Administered 2018-03-03 – 2018-03-10 (×15): 1 via ORAL
  Filled 2018-03-02 (×15): qty 1

## 2018-03-02 MED ORDER — ONDANSETRON HCL 4 MG PO TABS: 4.0000 mg | ORAL_TABLET | Freq: Four times a day (QID) | ORAL | Status: DC | PRN

## 2018-03-02 MED ORDER — MIRTAZAPINE 15 MG PO TABS
15.0000 mg | ORAL_TABLET | Freq: Every day | ORAL | Status: DC
  Administered 2018-03-03 – 2018-03-09 (×7): 15 mg via ORAL
  Filled 2018-03-02 (×7): qty 1

## 2018-03-02 MED ORDER — KETOROLAC TROMETHAMINE 15 MG/ML IJ SOLN
15.0000 mg | Freq: Four times a day (QID) | INTRAMUSCULAR | Status: AC | PRN
  Administered 2018-03-07 (×2): 15 mg via INTRAVENOUS
  Filled 2018-03-02 (×2): qty 1

## 2018-03-02 MED ORDER — BUSPIRONE HCL 5 MG PO TABS
15.0000 mg | ORAL_TABLET | Freq: Every day | ORAL | Status: DC
  Administered 2018-03-03 – 2018-03-10 (×8): 15 mg via ORAL
  Filled 2018-03-02 (×8): qty 1

## 2018-03-02 MED ORDER — SODIUM CHLORIDE (PF) 0.9 % IJ SOLN
INTRAMUSCULAR | Status: AC
  Filled 2018-03-02: qty 50

## 2018-03-02 MED ORDER — DONEPEZIL HCL 10 MG PO TABS
10.0000 mg | ORAL_TABLET | Freq: Every day | ORAL | Status: DC
  Administered 2018-03-03 – 2018-03-09 (×7): 10 mg via ORAL
  Filled 2018-03-02 (×7): qty 1

## 2018-03-02 MED ORDER — HEPARIN SODIUM (PORCINE) 5000 UNIT/ML IJ SOLN
5000.0000 [IU] | Freq: Three times a day (TID) | INTRAMUSCULAR | Status: DC
  Administered 2018-03-02 – 2018-03-07 (×15): 5000 [IU] via SUBCUTANEOUS
  Filled 2018-03-02 (×15): qty 1

## 2018-03-02 MED ORDER — SODIUM CHLORIDE 0.9 % IV SOLN
INTRAVENOUS | Status: DC
  Administered 2018-03-02 – 2018-03-03 (×2): via INTRAVENOUS

## 2018-03-02 NOTE — H&P (Signed)
History and Physical    Susan Simmons UEA:[id number] DOB: [birth year] DOA: 03/02/2018  PCP: Clayborn Heron, MD \ Patient coming from: Home    Chief Complaint: Dysphagia, weakness, altered mental status  HPI: Susan Simmons is a 84 y.o. female with medical history significant of Parkinson's disease, anxiety/depression, mild dementia who presents from home with daughter for dysphagia, decreased p.o. intake, weakness and found to be hypothermic.  In brief patient's daughter reports that for the past several days patient is noted to be increasingly weaker.  At baseline she can shuffle along with a festinating gait with assistance from the daughter.  This is been more more difficult.  Patient is also had increasing belching of food and regurgitation of food as well as rumination.  This is been ongoing for the past day.  Daughter has been trying to force feed patient however patient keeps coughing up food.  Patient denies any fevers, chills, nausea, vomiting, diarrhea, cough, congestion, chest pain.  Patient has been out of her Parkinson's medications for approximately 1 week as they are pending refills.  Her daughter is only been able to give her her home buspirone.  She is not had any weight loss, though she does complain of some dysphasia.  ED Course: In the ER patient's vitals and labs were unremarkable.  Chest x-ray showed evidence of a mediastinal mass impinging on the airway.  A CT scan of the chest is pending.  Review of Systems: As per HPI otherwise 10 point review of systems negative.    Past Medical History:  Diagnosis Date  . Anxiety   . Depression   . Heart murmur   . Parkinson disease (HCC)   . Tooth infection 2019    Past Surgical History:  Procedure Laterality Date  . denies surgical history       reports that she quit smoking about 22 years ago. She has never used smokeless tobacco. She reports that she does not drink alcohol or use drugs.  No Known  Allergies  Family History  Problem Relation Age of Onset  . Dementia Neg Hx   . Parkinsonism Neg Hx     Prior to Admission medications   Medication Sig Start Date End Date Taking? Authorizing Provider  busPIRone (BUSPAR) 15 MG tablet Take 15 mg by mouth daily.    Yes [provider]  carbidopa-levodopa (SINEMET IR) 25-100 MG tablet Take 1 tablet by mouth 2 (two) times daily. 02/25/18  Yes Anson Fret, MD  donepezil (ARICEPT) 10 MG tablet Take 1 tablet (10 mg total) by mouth at bedtime. 01/13/18  Yes Anson Fret, MD  ENSURE (ENSURE) Take 237 mLs by mouth 3 (three) times daily between meals. One can three times daily to promote wound healing and support weight status   Yes [provider]  mirtazapine (REMERON) 15 MG tablet Take 15 mg by mouth at bedtime.   Yes [provider]  vitamin C (ASCORBIC ACID) 500 MG tablet Take 500 mg by mouth daily. Take one tablet twice daily    Yes [provider]    Physical Exam: Vitals:   03/02/18 1335 03/02/18 1400 03/02/18 1415 03/02/18 1438  BP: 117/79 (!) 122/59  138/72  Pulse: 97 (!) 52 (!) 53 (!) 57  Resp: 16  11 15   Temp:    (!) 95.7 F (35.4 C)  TempSrc:    Rectal  SpO2: 99% 100% 99% 100%  Weight:        Constitutional: NAD, calm, comfortable  Vitals:   03/02/18 1335 03/02/18 1400 03/02/18 1415 03/02/18 1438  BP: 117/79 (!) 122/59  138/72  Pulse: 97 (!) 52 (!) 53 (!) 57  Resp: 16  11 15   Temp:    (!) 95.7 F (35.4 C)  TempSrc:    Rectal  SpO2: 99% 100% 99% 100%  Weight:       Eyes: Icteric sclera ENMT: Edentulous, crowded posterior pharynx Neck: normal, supple, enlarged mass posterior neck Respiratory: No increased work of breathing, diminished lung sounds at bases, no wheezes, rhonchi, rales Cardiovascular: Regular rate and rhythm, no murmurs Abdomen: Soft, nondistended, no rebound or guarding, plus bowel sounds Musculoskeletal: Trace lower extremity edema Skin: Bilateral shoulder  pressure ulcers and well-healing sacral ulcer Neurologic: Cranial nerves intact, moving all extremities, cogwheel rigidity noted Psychiatric: Unable to assess due to medical condition   Labs on Admission: I have personally reviewed following labs and imaging studies  CBC: Recent Labs  Lab 03/02/18 1414  WBC 5.8  NEUTROABS 3.1  HGB 13.5  HCT 44.4  MCV 95.5  PLT 246   Basic Metabolic Panel: Recent Labs  Lab 03/02/18 1414  NA 140  K 4.0  CL 103  CO2 28  GLUCOSE 92  BUN 23  CREATININE 0.81  CALCIUM 8.8*   GFR: Estimated Creatinine Clearance: 41.3 mL/min (by C-G formula based on SCr of 0.81 mg/dL). Liver Function Tests: Recent Labs  Lab 03/02/18 1414  AST 27  ALT 6  ALKPHOS 57  BILITOT 0.7  PROT 7.3  ALBUMIN 3.4*   No results for input(s): LIPASE, AMYLASE in the last 168 hours. Recent Labs  Lab 03/02/18 1414  AMMONIA 39*   Coagulation Profile: No results for input(s): INR, PROTIME in the last 168 hours. Cardiac Enzymes: No results for input(s): CKTOTAL, CKMB, CKMBINDEX, TROPONINI in the last 168 hours. BNP (last 3 results) No results for input(s): PROBNP in the last 8760 hours. HbA1C: No results for input(s): HGBA1C in the last 72 hours. CBG: No results for input(s): GLUCAP in the last 168 hours. Lipid Profile: No results for input(s): CHOL, HDL, LDLCALC, TRIG, CHOLHDL, LDLDIRECT in the last 72 hours. Thyroid Function Tests: Recent Labs    03/02/18 1448  TSH 1.000   Anemia Panel: No results for input(s): VITAMINB12, FOLATE, FERRITIN, TIBC, IRON, RETICCTPCT in the last 72 hours. Urine analysis:    Component Value Date/Time   COLORURINE STRAW (A) 03/02/2018 1414   APPEARANCEUR CLEAR 03/02/2018 1414   LABSPEC 1.003 (L) 03/02/2018 1414   PHURINE 6.0 03/02/2018 1414   GLUCOSEU NEGATIVE 03/02/2018 1414   HGBUR NEGATIVE 03/02/2018 1414   BILIRUBINUR NEGATIVE 03/02/2018 1414   KETONESUR NEGATIVE 03/02/2018 1414   PROTEINUR NEGATIVE 03/02/2018 1414    UROBILINOGEN 1.0 11/09/2014 0759   NITRITE NEGATIVE 03/02/2018 1414   LEUKOCYTESUR NEGATIVE 03/02/2018 1414    Radiological Exams on Admission: Dg Chest 2 View  Result Date: 03/02/2018 CLINICAL DATA:  The patient is unable to swallow this morning. EXAM: CHEST - 2 VIEW COMPARISON:  11/18/2015 FINDINGS: The trachea is deviated to the right. There is an abnormal convex contour of the left mediastinal border. Mild enlargement of the cardiac silhouette. There is no evidence of focal airspace consolidation, pleural effusion or pneumothorax. Osseous structures are without acute abnormality. Soft tissues are grossly normal. IMPRESSION: Abnormal convex contour of the left mediastinal border, suspicious for a mediastinal soft tissue mass or a vascular abnormality. If further imaging evaluation is desired, CT of the chest with contrast should be considered. Electronically  Signed   By: Ted Mcalpineobrinka  Dimitrova M.D.   On: 03/02/2018 15:22   Ct Head Wo Contrast  Result Date: 03/02/2018 CLINICAL DATA:  Unresponsive. EXAM: CT HEAD WITHOUT CONTRAST TECHNIQUE: Contiguous axial images were obtained from the base of the skull through the vertex without intravenous contrast. COMPARISON:  10/14/2015 FINDINGS: Brain: There is some progression of small vessel disease since the prior study. There is no evidence of hemorrhage, acute infarction, edema, mass effect, extra-axial fluid collection, hydrocephalus or mass lesion. Vascular: No hyperdense vessel or unexpected calcification. Skull: Normal. Negative for fracture or focal lesion. Sinuses/Orbits: No acute finding. Other: None. IMPRESSION: No acute findings. Some progression periventricular small vessel disease is noted since the prior study. Electronically Signed   By: Irish LackGlenn  Yamagata M.D.   On: 03/02/2018 15:07    EKG: Independently reviewed. None preformed  Assessment/Plan Principal Problem:   Dysphagia Active Problems:   Acute encephalopathy   Malnutrition of  moderate degree   Anxiety   Parkinsonism (HCC)   Dementia due to Parkinson's disease with behavioral disturbance (HCC)   Sacral ulcer, with unspecified severity (HCC)    #) Dysphagia/mediastinal mass: On brief review of the CT scan this appears to be large goiter. -N.p.o. -Speech-language pathology consult -We will consult general surgery -IV fluids  #) Acute metabolic encephalopathy/hypothermia: Rectal temperature was 95..  Patient does not have any signs or symptoms of infection.  Suspect most likely related to dehydration. -IV fluids per above - TSH normal -Follow-up cortisol random  #) Anxiety/depression: -Hold buspirone 15 mg daily -Continue mirtazapine 15 mg nightly  #) Parkinson's disease: -Hold carbidopa-levodopa twice daily  #) Dementia: -Hold donepezil 10 mg nightly  Fluids: IV fluids Electrolytes: Monitor and supplement Nutrition: N.p.o.  Prophylaxis: SQ heparin  Disposition: Pending etiology of mediastinal mass and discussed with general surgery  Full code     Delaine LameShrey C Cylus Douville MD [facility]  If 7PM-7AM, please contact night-coverage [email or web address] Password [email or web address]  03/02/2018, 4:22 PM

## 2018-03-02 NOTE — Progress Notes (Signed)
Pt has oral medicine and she is NPO. paged on call  C. Bodenheimer . Received order to held oral medicine until speech evaluate the  Patient. Will continue to monitor.

## 2018-03-02 NOTE — ED Triage Notes (Addendum)
Pt was not able to eat this morning. Pt is unable to swallow. Pt is drooling on herself, which per daughter is not regular.  Pt normally is more alert than she is. Pt is unable to follow commands, when she is normally able to follow simple commands. RR is WNL.

## 2018-03-02 NOTE — ED Notes (Signed)
ED TO INPATIENT HANDOFF REPORT  Name/Age/Gender Susan Simmons 84 y.o. female  Code Status Code Status History    Date Active Date Inactive Code Status Order ID Comments User Context   11/19/2015 0230 11/20/2015 1949 Full Code [id number]  Alberteen Sam, MD Inpatient   02/05/2015 0007 02/07/2015 1919 Full Code [id number]  Rhetta Mura, MD Inpatient   01/06/2015 2020 01/10/2015 2153 Full Code [id number]  Bobette Mo, MD Inpatient   11/09/2014 0212 11/11/2014 1957 Full Code [id number]  Eduard Clos, MD Inpatient   08/01/2014 1456 08/05/2014 1802 Full Code [id number]  Elgergawy, Leana Roe, MD Inpatient    Advance Directive Documentation     Most Recent Value  Type of Advance Directive  Healthcare Power of Attorney, Living will  Pre-existing out of facility DNR order (yellow form or pink MOST form)  -  "MOST" Form in Place?  -      Home/SNF/Other Home  Chief Complaint difficulty swallowing, choking  Level of Care/Admitting Diagnosis ED Disposition    ED Disposition Condition Comment   Admit  Hospital Area: [facility] [[id number]]  Level of Care: Med-Surg [16]  Diagnosis: Mediastinal mass [[id number]]  Admitting Physician: Delaine Lame [[id number]]  Attending Physician: Delaine Lame [[id number]]  Estimated length of stay: past midnight tomorrow  Certification:: I certify this patient will need inpatient services for at least 2 midnights  PT Class (Do Not Modify): Inpatient [101]  PT Acc Code (Do Not Modify): Private [1]       Medical History Past Medical History:  Diagnosis Date  . Anxiety   . Depression   . Heart murmur   . Parkinson disease (HCC)   . Tooth infection 2019    Allergies No Known Allergies  IV Location/Drains/Wounds Patient Lines/Drains/Airways Status   Active Line/Drains/Airways    Name:   Placement date:   Placement time:   Site:   Days:   Peripheral IV 07/09/17 Right Forearm   07/09/17    0120    Forearm    236   Pressure Ulcer 11/09/14 Stage III -  Full thickness tissue loss. Subcutaneous fat may be visible but bone, tendon or muscle are NOT exposed. wound has evolved into stage 3 to upper sacrum upon assessment on 12/5   11/09/14    8338     1209   Pressure Ulcer 11/09/14 Unstageable - Full thickness tissue loss in which the base of the ulcer is covered by slough (yellow, tan, gray, green or brown) and/or eschar (tan, brown or black) in the wound bed.   11/09/14    1000     1209   Pressure Ulcer 01/06/15 Unstageable - Full thickness tissue loss in which the base of the ulcer is covered by slough (yellow, tan, gray, green or brown) and/or eschar (tan, brown or black) in the wound bed. present during last admission   01/06/15    -     1151          Labs/Imaging Results for orders placed or performed during the hospital encounter of 03/02/18 (from the past 48 hour(s))  Lactic acid, plasma     Status: None   Collection Time: 03/02/18  1:56 PM  Result Value Ref Range   Lactic Acid, Venous 1.2 0.5 - 1.9 mmol/L    Comment: Performed at [facility], [address] [place], [place] [address]  Ammonia     Status: Abnormal   Collection Time: 03/02/18  2:14 PM  Result Value Ref Range   Ammonia 39 (H) 9 - 35 umol/L    Comment: Performed at [facility], [address] [place], [place] [address]  Comprehensive metabolic panel     Status: Abnormal   Collection Time: 03/02/18  2:14 PM  Result Value Ref Range   Sodium 140 135 - 145 mmol/L   Potassium 4.0 3.5 - 5.1 mmol/L   Chloride 103 98 - 111 mmol/L   CO2 28 22 - 32 mmol/L   Glucose, Bld 92 70 - 99 mg/dL   BUN 23 8 - 23 mg/dL   Creatinine, Ser 9.510.81 0.44 - 1.00 mg/dL   Calcium 8.8 (L) 8.9 - 10.3 mg/dL   Total Protein 7.3 6.5 - 8.1 g/dL   Albumin 3.4 (L) 3.5 - 5.0 g/dL   AST 27 15 - 41 U/L   ALT 6 0 - 44 U/L   Alkaline Phosphatase 57 38 - 126 U/L   Total Bilirubin 0.7 0.3 - 1.2 mg/dL   GFR calc non Af Amer  >60 >60 mL/min   GFR calc Af Amer >60 >60 mL/min   Anion gap 9 5 - 15    Comment: Performed at [facility], [address] [place], [place] [address]  Ethanol     Status: None   Collection Time: 03/02/18  2:14 PM  Result Value Ref Range   Alcohol, Ethyl (B) <10 <10 mg/dL    Comment: (NOTE) Lowest detectable limit for serum alcohol is 10 mg/dL. For medical purposes only. Performed at [facility], [address] [place], [place] [address]   CBC WITH DIFFERENTIAL     Status: None   Collection Time: 03/02/18  2:14 PM  Result Value Ref Range   WBC 5.8 4.0 - 10.5 K/uL   RBC 4.65 3.87 - 5.11 MIL/uL   Hemoglobin 13.5 12.0 - 15.0 g/dL   HCT 16.044.4 10.936.0 - 32.346.0 %   MCV 95.5 80.0 - 100.0 fL   MCH 29.0 26.0 - 34.0 pg   MCHC 30.4 30.0 - 36.0 g/dL   RDW 55.713.3 32.211.5 - 02.515.5 %   Platelets 246 150 - 400 K/uL   nRBC 0.0 0.0 - 0.2 %   Neutrophils Relative % 53 %   Neutro Abs 3.1 1.7 - 7.7 K/uL   Lymphocytes Relative 35 %   Lymphs Abs 2.0 0.7 - 4.0 K/uL   Monocytes Relative 8 %   Monocytes Absolute 0.5 0.1 - 1.0 K/uL   Eosinophils Relative 3 %   Eosinophils Absolute 0.2 0.0 - 0.5 K/uL   Basophils Relative 1 %   Basophils Absolute 0.0 0.0 - 0.1 K/uL   Immature Granulocytes 0 %   Abs Immature Granulocytes 0.02 0.00 - 0.07 K/uL    Comment: Performed at [facility], [address] [place], [place] [address]  Urinalysis, Routine w reflex microscopic     Status: Abnormal   Collection Time: 03/02/18  2:14 PM  Result Value Ref Range   Color, Urine STRAW (A) YELLOW   APPearance CLEAR CLEAR   Specific Gravity, Urine 1.003 (L) 1.005 - 1.030   pH 6.0 5.0 - 8.0   Glucose, UA NEGATIVE NEGATIVE mg/dL   Hgb urine dipstick NEGATIVE NEGATIVE   Bilirubin Urine NEGATIVE NEGATIVE   Ketones, ur NEGATIVE NEGATIVE mg/dL   Protein, ur NEGATIVE NEGATIVE mg/dL   Nitrite NEGATIVE NEGATIVE   Leukocytes, UA NEGATIVE NEGATIVE    Comment: Performed at [facility],  [address], [place], [place] [address]  TSH     Status: None   Collection Time: 03/02/18  2:48 PM  Result Value Ref Range   TSH 1.000 0.350 - 4.500 uIU/mL    Comment: Performed by a 3rd Generation assay with a functional sensitivity of <=0.01 uIU/mL. Performed at [facility], [address] [place], [place] [address]    Dg Chest 2 View  Result Date: 03/02/2018 CLINICAL DATA:  The patient is unable to swallow this morning. EXAM: CHEST - 2 VIEW COMPARISON:  11/18/2015 FINDINGS: The trachea is deviated to the right. There is an abnormal convex contour of the left mediastinal border. Mild enlargement of the cardiac silhouette. There is no evidence of focal airspace consolidation, pleural effusion or pneumothorax. Osseous structures are without acute abnormality. Soft tissues are grossly normal. IMPRESSION: Abnormal convex contour of the left mediastinal border, suspicious for a mediastinal soft tissue mass or a vascular abnormality. If further imaging evaluation is desired, CT of the chest with contrast should be considered. Electronically Signed   By: Ted Mcalpine M.D.   On: 03/02/2018 15:22   Ct Head Wo Contrast  Result Date: 03/02/2018 CLINICAL DATA:  Unresponsive. EXAM: CT HEAD WITHOUT CONTRAST TECHNIQUE: Contiguous axial images were obtained from the base of the skull through the vertex without intravenous contrast. COMPARISON:  10/14/2015 FINDINGS: Brain: There is some progression of small vessel disease since the prior study. There is no evidence of hemorrhage, acute infarction, edema, mass effect, extra-axial fluid collection, hydrocephalus or mass lesion. Vascular: No hyperdense vessel or unexpected calcification. Skull: Normal. Negative for fracture or focal lesion. Sinuses/Orbits: No acute finding. Other: None. IMPRESSION: No acute findings. Some progression periventricular small vessel disease is noted since the prior study.  Electronically Signed   By: Irish Lack M.D.   On: 03/02/2018 15:07   Ct Chest W Contrast  Result Date: 03/02/2018 CLINICAL DATA:  Abnormal chest x-ray EXAM: CT CHEST WITH CONTRAST TECHNIQUE: Multidetector CT imaging of the chest was performed during intravenous contrast administration. CONTRAST:  14mL OMNIPAQUE IOHEXOL 300 MG/ML  SOLN COMPARISON:  Film from earlier in the same day. FINDINGS: Cardiovascular: Thoracic aorta demonstrates atherosclerotic calcifications without aneurysmal dilatation or dissection. No cardiac enlargement is seen. Mild coronary calcifications are noted. The pulmonary artery as visualized is within normal limits. Mediastinum/Nodes: Thoracic inlet is within normal limits with the exception of an enlarged thyroid with multiple small nodules within. The overall appearance is stable from prior CT examination from 2017. no hilar or mediastinal adenopathy is noted. The esophagus is within normal limits. Lungs/Pleura: Lungs are well aerated bilaterally without focal infiltrate or sizable effusion. No focal mass lesion is noted. The abnormality seen on the prior chest x-ray is related to patient rotation and a combination of vascular, bony and parenchymal shadows. No mass lesion is seen. Upper Abdomen: Changes of prior cholecystectomy are noted with dilatation of the biliary tree. Musculoskeletal: Degenerative changes of the cervicothoracic spine are seen. No acute bony abnormality is noted. No compression deformities are seen. IMPRESSION: Abnormality seen on recent chest x-ray does not represent a mass but a combination of vascular parenchymal and bony shadows accentuated by patient rotation to the right. Stable goiter dating back to 2017. No acute abnormality noted. Aortic Atherosclerosis (ICD10-I70.0). Electronically Signed   By: Alcide Clever M.D.   On: 03/02/2018 16:37   None  Pending Labs Unresulted Labs (From admission, onward)    Start     Ordered   03/02/18  1440  T4  ONCE -  STAT,   STAT     03/02/18 1439   03/02/18 1440  Cortisol  Once,   R     03/02/18 1439   03/02/18 1356  Blood Cultures (routine x 2)  BLOOD CULTURE X 2,   STAT     03/02/18 1356   03/02/18 1356  Lactic acid, plasma  Now then every 2 hours,   STAT     03/02/18 1356          Vitals/Pain Today's Vitals   03/02/18 1415 03/02/18 1438 03/02/18 1530 03/02/18 1600  BP:  138/72 (!) 126/54 (!) 118/55  Pulse: (!) 53 (!) 57 (!) 51 (!) 58  Resp: 11 15 15 10   Temp:  (!) 95.7 F (35.4 C)    TempSrc:  Rectal    SpO2: 99% 100% 100% 100%  Weight:        Isolation Precautions No active isolations  Medications Medications  sodium chloride (PF) 0.9 % injection (has no administration in time range)  sodium chloride 0.9 % bolus 1,000 mL (0 mLs Intravenous Stopped 03/02/18 1606)  iohexol (OMNIPAQUE) 300 MG/ML solution 75 mL (75 mLs Intravenous Contrast Given 03/02/18 1609)    Mobility walks with person assist

## 2018-03-02 NOTE — Progress Notes (Signed)
Patient received from ED via stretcher; purewick in place; NSL to right A/C. On skin assessment, multiple areas of pressure injury noted: right and left shoulders, right elbow, right hip, sacral area. Heels red and dry; elevated on pillows. WOC consult placed; SW/CM consults placed for home evaluation and discharge needs. Patient lives at home with daughter; history of dementia; when directly asked if she feels safe at home, patient answered yes.   Daughter: [name] [phone]

## 2018-03-02 NOTE — ED Provider Notes (Signed)
[facility]-EMERGENCY DEPT Provider Note   CSN: [id number] Arrival date & time: 03/02/18  1300     History   Chief Complaint Chief Complaint  Patient presents with  . Dysphagia    HPI Susan Simmons is a 84 y.o. female.  84 yo F with a chief complaint of inability to swallow.  Family states that she has been increasingly weak over the past few days and that she had been taking in less oral intake since yesterday.  They deny choking on anything.  Today they tried to give her water and she ended up having it come out of her mouth.  They deny overt vomiting.  She has a history of Parkinson's disease and normally can walk with assistance and is somewhat conversive per the caregiver.  The history is provided by the patient.  Illness  Severity:  Mild Onset quality:  Gradual Duration:  2 days Timing:  Constant Progression:  Worsening Chronicity:  New Associated symptoms: vomiting   Associated symptoms: no chest pain, no congestion, no fever, no headaches, no myalgias, no nausea, no rhinorrhea, no shortness of breath and no wheezing     Past Medical History:  Diagnosis Date  . Anxiety   . Depression   . Heart murmur   . Parkinson disease (HCC)   . Tooth infection 2019    Patient Active Problem List   Diagnosis Date Noted  . Dysphagia 03/02/2018  . Mediastinal mass 03/02/2018  . Fall 09/25/2016  . Sacral ulcer, with unspecified severity (HCC) 09/25/2016  . Suspected deep tissue injury of unknown depth of heel 09/25/2016  . Dementia due to Parkinson's disease with behavioral disturbance (HCC) 12/19/2015  . Psychosis (HCC) 12/19/2015  . AKI (acute kidney injury) (HCC) 11/22/2015  . Dehydration 11/22/2015  . Parkinsonism (HCC) 01/25/2015  . Abnormality of gait   . Leukopenia 01/07/2015  . Anxiety 11/12/2014  . Malnutrition of moderate degree 11/10/2014  . Pressure ulcer stage II 11/09/2014  . Acute encephalopathy 08/02/2014  . Depression 05/02/2014     Past Surgical History:  Procedure Laterality Date  . denies surgical history       OB History   No obstetric history on file.      Home Medications    Prior to Admission medications   Medication Sig Start Date End Date Taking? Authorizing Provider  busPIRone (BUSPAR) 15 MG tablet Take 15 mg by mouth daily.    Yes [provider]  carbidopa-levodopa (SINEMET IR) 25-100 MG tablet Take 1 tablet by mouth 2 (two) times daily. 02/25/18  Yes Anson Fret, MD  donepezil (ARICEPT) 10 MG tablet Take 1 tablet (10 mg total) by mouth at bedtime. 01/13/18  Yes Anson Fret, MD  ENSURE (ENSURE) Take 237 mLs by mouth 3 (three) times daily between meals. One can three times daily to promote wound healing and support weight status   Yes [provider]  mirtazapine (REMERON) 15 MG tablet Take 15 mg by mouth at bedtime.   Yes [provider]  vitamin C (ASCORBIC ACID) 500 MG tablet Take 500 mg by mouth daily. Take one tablet twice daily    Yes [provider]    Family History Family History  Problem Relation Age of Onset  . Dementia Neg Hx   . Parkinsonism Neg Hx     Social History Social History   Tobacco Use  . Smoking status: Former Smoker    Last attempt to quit: 03/04/1996    Years  since quitting: 22.0  . Smokeless tobacco: Never Used  Substance Use Topics  . Alcohol use: No  . Drug use: No     Allergies   Patient has no known allergies.   Review of Systems Review of Systems  Constitutional: Negative for chills and fever.  HENT: Negative for congestion and rhinorrhea.   Eyes: Negative for redness and visual disturbance.  Respiratory: Negative for shortness of breath and wheezing.   Cardiovascular: Negative for chest pain and palpitations.  Gastrointestinal: Positive for vomiting. Negative for nausea.  Genitourinary: Negative for dysuria and urgency.  Musculoskeletal: Negative for arthralgias and myalgias.  Skin: Negative for  pallor and wound.  Neurological: Negative for dizziness and headaches.     Physical Exam Updated Vital Signs BP (!) 121/53   Pulse 65   Temp 97.6 F (36.4 C) (Oral)   Resp 20   Wt 48 kg   SpO2 97%   BMI 18.16 kg/m   Physical Exam Vitals signs and nursing note reviewed.  Constitutional:      General: She is not in acute distress.    Appearance: She is well-developed. She is ill-appearing (chronically so). She is not diaphoretic.  HENT:     Head: Normocephalic and atraumatic.  Eyes:     Pupils: Pupils are equal, round, and reactive to light.  Neck:     Musculoskeletal: Normal range of motion and neck supple.  Cardiovascular:     Rate and Rhythm: Normal rate and regular rhythm.     Heart sounds: No murmur. No friction rub. No gallop.   Pulmonary:     Effort: Pulmonary effort is normal.     Breath sounds: No wheezing or rales.  Abdominal:     General: There is no distension.     Palpations: Abdomen is soft.     Tenderness: There is no abdominal tenderness.  Musculoskeletal:        General: No tenderness.     Comments: Diffuse muscle waisting.   Skin:    General: Skin is warm and dry.  Neurological:     Mental Status: She is alert and oriented to person, place, and time.  Psychiatric:        Behavior: Behavior normal.      ED Treatments / Results  Labs (all labs ordered are listed, but only abnormal results are displayed) Labs Reviewed  AMMONIA - Abnormal; Notable for the following components:      Result Value   Ammonia 39 (*)    All other components within normal limits  COMPREHENSIVE METABOLIC PANEL - Abnormal; Notable for the following components:   Calcium 8.8 (*)    Albumin 3.4 (*)    All other components within normal limits  URINALYSIS, ROUTINE W REFLEX MICROSCOPIC - Abnormal; Notable for the following components:   Color, Urine STRAW (*)    Specific Gravity, Urine 1.003 (*)    All other components within normal limits  GLUCOSE, CAPILLARY -  Abnormal; Notable for the following components:   Glucose-Capillary 34 (*)    All other components within normal limits  GLUCOSE, CAPILLARY - Abnormal; Notable for the following components:   Glucose-Capillary 141 (*)    All other components within normal limits  GLUCOSE, CAPILLARY - Abnormal; Notable for the following components:   Glucose-Capillary 135 (*)    All other components within normal limits  CULTURE, BLOOD (ROUTINE X 2)  CULTURE, BLOOD (ROUTINE X 2)  ETHANOL  CBC WITH DIFFERENTIAL/PLATELET  LACTIC ACID, PLASMA  LACTIC  ACID, PLASMA  TSH  CORTISOL  T4    EKG None  Radiology Dg Chest 2 View  Result Date: 03/02/2018 CLINICAL DATA:  The patient is unable to swallow this morning. EXAM: CHEST - 2 VIEW COMPARISON:  11/18/2015 FINDINGS: The trachea is deviated to the right. There is an abnormal convex contour of the left mediastinal border. Mild enlargement of the cardiac silhouette. There is no evidence of focal airspace consolidation, pleural effusion or pneumothorax. Osseous structures are without acute abnormality. Soft tissues are grossly normal. IMPRESSION: Abnormal convex contour of the left mediastinal border, suspicious for a mediastinal soft tissue mass or a vascular abnormality. If further imaging evaluation is desired, CT of the chest with contrast should be considered. Electronically Signed   By: Ted Mcalpine M.D.   On: 03/02/2018 15:22   Ct Head Wo Contrast  Result Date: 03/02/2018 CLINICAL DATA:  Unresponsive. EXAM: CT HEAD WITHOUT CONTRAST TECHNIQUE: Contiguous axial images were obtained from the base of the skull through the vertex without intravenous contrast. COMPARISON:  10/14/2015 FINDINGS: Brain: There is some progression of small vessel disease since the prior study. There is no evidence of hemorrhage, acute infarction, edema, mass effect, extra-axial fluid collection, hydrocephalus or mass lesion. Vascular: No hyperdense vessel or unexpected  calcification. Skull: Normal. Negative for fracture or focal lesion. Sinuses/Orbits: No acute finding. Other: None. IMPRESSION: No acute findings. Some progression periventricular small vessel disease is noted since the prior study. Electronically Signed   By: Irish Lack M.D.   On: 03/02/2018 15:07   Ct Chest W Contrast  Result Date: 03/02/2018 CLINICAL DATA:  Abnormal chest x-ray EXAM: CT CHEST WITH CONTRAST TECHNIQUE: Multidetector CT imaging of the chest was performed during intravenous contrast administration. CONTRAST:  75mL OMNIPAQUE IOHEXOL 300 MG/ML  SOLN COMPARISON:  Film from earlier in the same day. FINDINGS: Cardiovascular: Thoracic aorta demonstrates atherosclerotic calcifications without aneurysmal dilatation or dissection. No cardiac enlargement is seen. Mild coronary calcifications are noted. The pulmonary artery as visualized is within normal limits. Mediastinum/Nodes: Thoracic inlet is within normal limits with the exception of an enlarged thyroid with multiple small nodules within. The overall appearance is stable from prior CT examination from 2017. no hilar or mediastinal adenopathy is noted. The esophagus is within normal limits. Lungs/Pleura: Lungs are well aerated bilaterally without focal infiltrate or sizable effusion. No focal mass lesion is noted. The abnormality seen on the prior chest x-ray is related to patient rotation and a combination of vascular, bony and parenchymal shadows. No mass lesion is seen. Upper Abdomen: Changes of prior cholecystectomy are noted with dilatation of the biliary tree. Musculoskeletal: Degenerative changes of the cervicothoracic spine are seen. No acute bony abnormality is noted. No compression deformities are seen. IMPRESSION: Abnormality seen on recent chest x-ray does not represent a mass but a combination of vascular parenchymal and bony shadows accentuated by patient rotation to the right. Stable goiter dating back to 2017. No acute abnormality  noted. Aortic Atherosclerosis (ICD10-I70.0). Electronically Signed   By: Alcide Clever M.D.   On: 03/02/2018 16:37    Procedures Procedures (including critical care time)  Medications Ordered in ED Medications  sodium chloride (PF) 0.9 % injection (has no administration in time range)  heparin injection 5,000 Units (5,000 Units Subcutaneous Given 03/03/18 0619)  sodium chloride flush (NS) 0.9 % injection 3 mL (3 mLs Intravenous Given 03/03/18 1040)  ketorolac (TORADOL) 15 MG/ML injection 15 mg (has no administration in time range)  ondansetron (ZOFRAN) tablet 4 mg (has  no administration in time range)    Or  ondansetron (ZOFRAN) injection 4 mg (has no administration in time range)  busPIRone (BUSPAR) tablet 15 mg (15 mg Oral Given 03/03/18 1039)  donepezil (ARICEPT) tablet 10 mg (10 mg Oral Not Given 03/02/18 2224)  mirtazapine (REMERON) tablet 15 mg (15 mg Oral Not Given 03/02/18 2224)  carbidopa-levodopa (SINEMET IR) 25-100 MG per tablet immediate release 1 tablet (1 tablet Oral Given 03/03/18 1039)  dextrose 5 %-0.9 % sodium chloride infusion ( Intravenous New Bag/Given 03/03/18 0652)  sodium chloride 0.9 % bolus 1,000 mL (0 mLs Intravenous Stopped 03/02/18 1606)  iohexol (OMNIPAQUE) 300 MG/ML solution 75 mL (75 mLs Intravenous Contrast Given 03/02/18 1609)  dextrose 50 % solution (50 mLs  Given 03/03/18 0610)     Initial Impression / Assessment and Plan / ED Course  I have reviewed the triage vital signs and the nursing notes.  Pertinent labs & imaging results that were available during my care of the patient were reviewed by me and considered in my medical decision making (see chart for details).     84 yo F with a chief complaints of weakness and difficulty swallowing.  This was noted at least since yesterday.  Historically they state that she normally can ambulate with assistance and also is conversant though looking at her she has severe muscle wasting to bilateral lower extremities,  not totally consistent with that history.  She is not cognizant enough to complete a neurologic exam.  She has pressure ulcers to bilateral shoulders and the right ear.  Patient with mild improvement with iv fluids, still not back to baseline.  Will discuss with the hospitalist.   CRITICAL CARE Performed by: Rae Roam   Total critical care time: 35 minutes  Critical care time was exclusive of separately billable procedures and treating other patients.  Critical care was necessary to treat or prevent imminent or life-threatening deterioration.  Critical care was time spent personally by me on the following activities: development of treatment plan with patient and/or surrogate as well as nursing, discussions with consultants, evaluation of patient's response to treatment, examination of patient, obtaining history from patient or surrogate, ordering and performing treatments and interventions, ordering and review of laboratory studies, ordering and review of radiographic studies, pulse oximetry and re-evaluation of patient's condition.  The patients results and plan were reviewed and discussed.   Any x-rays performed were independently reviewed by myself.   Differential diagnosis were considered with the presenting HPI.  Medications  sodium chloride (PF) 0.9 % injection (has no administration in time range)  heparin injection 5,000 Units (5,000 Units Subcutaneous Given 03/03/18 0619)  sodium chloride flush (NS) 0.9 % injection 3 mL (3 mLs Intravenous Given 03/03/18 1040)  ketorolac (TORADOL) 15 MG/ML injection 15 mg (has no administration in time range)  ondansetron (ZOFRAN) tablet 4 mg (has no administration in time range)    Or  ondansetron (ZOFRAN) injection 4 mg (has no administration in time range)  busPIRone (BUSPAR) tablet 15 mg (15 mg Oral Given 03/03/18 1039)  donepezil (ARICEPT) tablet 10 mg (10 mg Oral Not Given 03/02/18 2224)  mirtazapine (REMERON) tablet 15 mg (15 mg  Oral Not Given 03/02/18 2224)  carbidopa-levodopa (SINEMET IR) 25-100 MG per tablet immediate release 1 tablet (1 tablet Oral Given 03/03/18 1039)  dextrose 5 %-0.9 % sodium chloride infusion ( Intravenous New Bag/Given 03/03/18 0652)  sodium chloride 0.9 % bolus 1,000 mL (0 mLs Intravenous Stopped 03/02/18 1606)  iohexol (  OMNIPAQUE) 300 MG/ML solution 75 mL (75 mLs Intravenous Contrast Given 03/02/18 1609)  dextrose 50 % solution (50 mLs  Given 03/03/18 0610)    Vitals:   03/03/18 0557 03/03/18 0625 03/03/18 0829 03/03/18 0845  BP: (!) 124/53 (!) 120/55 (!) 114/104 (!) 121/53  Pulse: 64 68 60 65  Resp:  14 12 20   Temp:   97.6 F (36.4 C)   TempSrc:   Oral   SpO2:  100% 97%   Weight:        Final diagnoses:  Hypothermia of newborn    Admission/ observation were discussed with the admitting physician, patient and/or family and they are comfortable with the plan.    Final Clinical Impressions(s) / ED Diagnoses   Final diagnoses:  Hypothermia of newborn    ED Discharge Orders    None       Melene Plan, DO 03/03/18 1112

## 2018-03-02 NOTE — ED Notes (Signed)
Bed: WA17 Expected date:  Expected time:  Means of arrival:  Comments: [name]

## 2018-03-03 ENCOUNTER — Other Ambulatory Visit: Payer: Self-pay

## 2018-03-03 DIAGNOSIS — R1310 Dysphagia, unspecified: Secondary | ICD-10-CM | POA: Diagnosis not present

## 2018-03-03 LAB — GLUCOSE, CAPILLARY
Glucose-Capillary: 141 mg/dL — ABNORMAL HIGH (ref 70–99)
Glucose-Capillary: 34 mg/dL — CL (ref 70–99)
Glucose-Capillary: 135 mg/dL — ABNORMAL HIGH (ref 70–99)

## 2018-03-03 MED ORDER — DEXTROSE 50 % IV SOLN
INTRAVENOUS | Status: AC
  Administered 2018-03-03: 50 mL
  Filled 2018-03-03: qty 50

## 2018-03-03 MED ORDER — DEXTROSE-NACL 5-0.9 % IV SOLN
INTRAVENOUS | Status: DC
  Administered 2018-03-03 – 2018-03-04 (×3): via INTRAVENOUS

## 2018-03-03 MED ORDER — COLLAGENASE 250 UNIT/GM EX OINT
TOPICAL_OINTMENT | Freq: Every day | CUTANEOUS | Status: DC
  Administered 2018-03-03 – 2018-03-10 (×8): via TOPICAL
  Filled 2018-03-03: qty 90

## 2018-03-03 MED ORDER — BACITRACIN 500 UNIT/GM EX OINT
TOPICAL_OINTMENT | Freq: Two times a day (BID) | CUTANEOUS | Status: DC
  Administered 2018-03-03: 1 via TOPICAL
  Administered 2018-03-03 – 2018-03-08 (×11): via TOPICAL
  Administered 2018-03-09: 1 via TOPICAL
  Administered 2018-03-09 – 2018-03-10 (×3): via TOPICAL
  Filled 2018-03-03: qty 14

## 2018-03-03 NOTE — Progress Notes (Signed)
PROGRESS NOTE    [name]  WGN:[id number] DOB: 11-May-1934 DOA: 03/02/2018 PCP: Susan Heron, MD   Brief Narrative:  84 y.o. female with medical history significant of Parkinson's disease, anxiety/depression, mild dementia who presents from home with daughter for dysphagia, decreased p.o. intake, weakness and found to be hypothermic.     Assessment & Plan:   Principal Problem:   Dysphagia Active Problems:   Acute encephalopathy   Malnutrition of moderate degree   Anxiety   Parkinsonism (HCC)   Dementia due to Parkinson's disease with behavioral disturbance (HCC)   Sacral ulcer, with unspecified severity (HCC)   Mediastinal mass  #)  Hypoglycemia/hypothermia: Suspect patient symptoms are almost certainly related to decreased p.o. intake.  Her hypoglycemia is likely driving factor for hypothermia. -IV fluids with D5 - TSH normal -Random cortisol was 6.7, difficult to interpret  #) Dysphagia: CT scan showed only large goiter that was not impinging -N.p.o. -Speech-language pathology consult -IV fluids  #) Anxiety/depression: -Continue buspirone 15 mg daily -Continue mirtazapine 15 mg nightly  #) Parkinson's disease: -Continue carbidopa-levodopa twice daily  #) Dementia: -Continue donepezil 10 mg nightly  Fluids: IV fluids Electrolytes: Monitor and supplement Nutrition: N.p.o.  Prophylaxis: SQ heparin  Disposition: Pending evaluation by speech and tolerating p.o.  Full code       Consultants:   None  Procedures:   None  Antimicrobials:   None   Subjective: This morning patient was doing fairly well.  She does not have any complaints that she is not complete alert and oriented.  Her daughter is now in the room.  A rapid response was called as patient was noted to have possible hypoxia though the probe was not picking up well.  Patient was noted to be hypoglycemic and was given an amp of D50 and started on  D5.  Objective: Vitals:   03/03/18 0557 03/03/18 0625 03/03/18 0829 03/03/18 0845  BP: (!) 124/53 (!) 120/55 (!) 114/104 (!) 121/53  Pulse: 64 68 60 65  Resp:  14 12 20   Temp:   97.6 F (36.4 C)   TempSrc:   Oral   SpO2:  100% 97%   Weight:        Intake/Output Summary (Last 24 hours) at 03/03/2018 1022 Last data filed at 03/03/2018 0659 Gross per 24 hour  Intake 2068.69 ml  Output 300 ml  Net 1768.69 ml   Filed Weights   03/02/18 1312  Weight: 48 kg    Examination:  General exam: No acute distress Respiratory system: Clear to auscultation. Respiratory effort normal. Cardiovascular system: Regular rate and rhythm, no murmurs Gastrointestinal system: Soft, nondistended, no rebound or guarding, plus bowel sounds Central nervous system: Alert but not oriented, moving all extremities, cogwheel rigidity and slowing noted Extremities: Symmetric 5 x 5 power. Skin: Bilateral shoulder pressure ulcers and well-healing sacral ulcers Psychiatry: Unable to assess due to medical condition    Data Reviewed: I have personally reviewed following labs and imaging studies  CBC: Recent Labs  Lab 03/02/18 1414  WBC 5.8  NEUTROABS 3.1  HGB 13.5  HCT 44.4  MCV 95.5  PLT 246   Basic Metabolic Panel: Recent Labs  Lab 03/02/18 1414  NA 140  K 4.0  CL 103  CO2 28  GLUCOSE 92  BUN 23  CREATININE 0.81  CALCIUM 8.8*   GFR: Estimated Creatinine Clearance: 41.3 mL/min (by C-G formula based on SCr of 0.81 mg/dL). Liver Function Tests: Recent Labs  Lab 03/02/18 1414  AST  27  ALT 6  ALKPHOS 57  BILITOT 0.7  PROT 7.3  ALBUMIN 3.4*   No results for input(s): LIPASE, AMYLASE in the last 168 hours. Recent Labs  Lab 03/02/18 1414  AMMONIA 39*   Coagulation Profile: No results for input(s): INR, PROTIME in the last 168 hours. Cardiac Enzymes: No results for input(s): CKTOTAL, CKMB, CKMBINDEX, TROPONINI in the last 168 hours. BNP (last 3 results) No results for input(s):  PROBNP in the last 8760 hours. HbA1C: No results for input(s): HGBA1C in the last 72 hours. CBG: Recent Labs  Lab 03/03/18 0606 03/03/18 0628 03/03/18 0738  GLUCAP 34* 141* 135*   Lipid Profile: No results for input(s): CHOL, HDL, LDLCALC, TRIG, CHOLHDL, LDLDIRECT in the last 72 hours. Thyroid Function Tests: Recent Labs    03/02/18 1448  TSH 1.000   Anemia Panel: No results for input(s): VITAMINB12, FOLATE, FERRITIN, TIBC, IRON, RETICCTPCT in the last 72 hours. Sepsis Labs: Recent Labs  Lab 03/02/18 1356 03/02/18 1414  LATICACIDVEN 1.2 1.0    No results found for this or any previous visit (from the past 240 hour(s)).       Radiology Studies: Dg Chest 2 View  Result Date: 03/02/2018 CLINICAL DATA:  The patient is unable to swallow this morning. EXAM: CHEST - 2 VIEW COMPARISON:  11/18/2015 FINDINGS: The trachea is deviated to the right. There is an abnormal convex contour of the left mediastinal border. Mild enlargement of the cardiac silhouette. There is no evidence of focal airspace consolidation, pleural effusion or pneumothorax. Osseous structures are without acute abnormality. Soft tissues are grossly normal. IMPRESSION: Abnormal convex contour of the left mediastinal border, suspicious for a mediastinal soft tissue mass or a vascular abnormality. If further imaging evaluation is desired, CT of the chest with contrast should be considered. Electronically Signed   By: Ted Mcalpineobrinka  Dimitrova M.D.   On: 03/02/2018 15:22   Ct Head Wo Contrast  Result Date: 03/02/2018 CLINICAL DATA:  Unresponsive. EXAM: CT HEAD WITHOUT CONTRAST TECHNIQUE: Contiguous axial images were obtained from the base of the skull through the vertex without intravenous contrast. COMPARISON:  10/14/2015 FINDINGS: Brain: There is some progression of small vessel disease since the prior study. There is no evidence of hemorrhage, acute infarction, edema, mass effect, extra-axial fluid collection, hydrocephalus  or mass lesion. Vascular: No hyperdense vessel or unexpected calcification. Skull: Normal. Negative for fracture or focal lesion. Sinuses/Orbits: No acute finding. Other: None. IMPRESSION: No acute findings. Some progression periventricular small vessel disease is noted since the prior study. Electronically Signed   By: Irish LackGlenn  Yamagata M.D.   On: 03/02/2018 15:07   Ct Chest W Contrast  Result Date: 03/02/2018 CLINICAL DATA:  Abnormal chest x-ray EXAM: CT CHEST WITH CONTRAST TECHNIQUE: Multidetector CT imaging of the chest was performed during intravenous contrast administration. CONTRAST:  75mL OMNIPAQUE IOHEXOL 300 MG/ML  SOLN COMPARISON:  Film from earlier in the same day. FINDINGS: Cardiovascular: Thoracic aorta demonstrates atherosclerotic calcifications without aneurysmal dilatation or dissection. No cardiac enlargement is seen. Mild coronary calcifications are noted. The pulmonary artery as visualized is within normal limits. Mediastinum/Nodes: Thoracic inlet is within normal limits with the exception of an enlarged thyroid with multiple small nodules within. The overall appearance is stable from prior CT examination from 2017. no hilar or mediastinal adenopathy is noted. The esophagus is within normal limits. Lungs/Pleura: Lungs are well aerated bilaterally without focal infiltrate or sizable effusion. No focal mass lesion is noted. The abnormality seen on the prior chest x-ray  is related to patient rotation and a combination of vascular, bony and parenchymal shadows. No mass lesion is seen. Upper Abdomen: Changes of prior cholecystectomy are noted with dilatation of the biliary tree. Musculoskeletal: Degenerative changes of the cervicothoracic spine are seen. No acute bony abnormality is noted. No compression deformities are seen. IMPRESSION: Abnormality seen on recent chest x-ray does not represent a mass but a combination of vascular parenchymal and bony shadows accentuated by patient rotation to the  right. Stable goiter dating back to 2017. No acute abnormality noted. Aortic Atherosclerosis (ICD10-I70.0). Electronically Signed   By: Alcide CleverMark  Lukens M.D.   On: 03/02/2018 16:37        Scheduled Meds: . busPIRone  15 mg Oral Daily  . carbidopa-levodopa  1 tablet Oral BID  . donepezil  10 mg Oral QHS  . heparin  5,000 Units Subcutaneous Q8H  . mirtazapine  15 mg Oral QHS  . sodium chloride flush  3 mL Intravenous Q12H   Continuous Infusions: . dextrose 5 % and 0.9% NaCl 100 mL/hr at 03/03/18 0652     LOS: 1 day    Time spent: 35    Delaine LameShrey C Thomes Burak, MD [facility]  If 7PM-7AM, please contact night-coverage [email or web address] Password [email or web address] 03/03/2018, 10:22 AM

## 2018-03-03 NOTE — Consult Note (Signed)
WOC Nurse wound consult note Reason for Consult: Patient with Unstageable pressure injury to left shoulder, Stage 3 pressure injuries to sacrum, ear and right shoulder, Stage 2 to right hip Wound type: Pressure Pressure Injury POA: Yes Measurement: Wound bed: As described above Drainage (amount, consistency, odor) Small amount of serous exudate from sacrum, right ear and right hip.  Moderate amount of drainage due to early autolysis beneath form addressing from left shoulder. Periwound: intact, dry Dressing procedure/placement/frequency: A mattress replacement with low air loss feature is ordered for placement today as are bilateral pressure redistribution heel boots.Topical care to the right ear, right shoulder and right hip lesions will be bacitracin ointment and moist to dry dressings twice daily, topical care to the sacral pressure injury will be to fill defect with a silver hydrofiber, fan-fold the remaining dressing over the ulcer and top with dry gauze 2x2s prior to applying the sacral silicone foam dressing.  The left shoulder Unstageable pressure injury with eschar will be treated with collagenase (Santyl) to enzymatically debride the eschar and reveal the true depth of injury. Paitent to be turned from side to side but care is to be taken to avoid placing full weight on shoulders.  Currently, an external urinary incontinence device is in place (PurWick). Post discharge, timely incontinence care will be an important part of the care plan for her integumentary system.  WOC nursing team will not follow, but will remain available to this patient, the nursing and medical teams.  Please re-consult if needed. Thanks, Ladona MowLaurie Ousman Dise, MSN, RN, GNP, Hans EdenCWOCN, CWON-AP, FAAN  Pager# [phone]

## 2018-03-03 NOTE — Progress Notes (Signed)
Notify Dr. Lauretta Chester regarding patient's B/P. No new orders

## 2018-03-03 NOTE — Evaluation (Signed)
Clinical/Bedside Swallow Evaluation Patient Details  Name: Sabino GasserShirley Percival MRN: [id number] Date of Birth: 1934-08-19  Today's Date: 03/03/2018 Time: SLP Start Time (ACUTE ONLY): 1001 SLP Stop Time (ACUTE ONLY): 1031 SLP Time Calculation (min) (ACUTE ONLY): 30 min  Past Medical History:  Past Medical History:  Diagnosis Date  . Anxiety   . Depression   . Heart murmur   . Parkinson disease (HCC)   . Tooth infection 2019   Past Surgical History:  Past Surgical History:  Procedure Laterality Date  . denies surgical history     HPI:  84 [facility], decreased po intake, lethargy.  PMH + for Parkinsons disease, dementia, depression, multiple falls, hypoglycemia, stable goiter, possible esophageal narrowing - "choked' on spaghetti in June 2019 prompting ED visit- no endoscopy completed.  Pt did not follow up with GI as daughter reported to neurologist that pt did better. Swallow eval ordered now.    Assessment / Plan / Recommendation Clinical Impression  Patient presents with functional oropharyngeal swallow based on clinical assessment. She does not open her oral cavity wide despite cues which may impair/decrease mastication ability.  She does however take only very small boluses of foods - suspect cogntiive based.  Also h/o possible esophageal dysphagia - although she was not scoped in June 2019 after "choking" on spaghetti.  Anterior Cervical Osteophytes present per CT neck (done after fall) in 10/2015 that may impact pharyngeal clearance.  However no indication of residuals noted nor s/s of aspiration.  Suspect pt's dysphagia was due to mentation and poor intake due to dementia.  Recommend dys3/ground meats/thin with medication with puree - whole if small.  SlP will follow up to assure tolerance.  Spoke to RN regarding recommendations and posted swallow precaution sign.   SLP Visit Diagnosis: Dysphagia, oropharyngeal phase (R13.12)    Aspiration Risk  Mild aspiration risk     Diet Recommendation Dysphagia 3 (Mech soft);Thin liquid   Liquid Administration via: Cup;Straw Medication Administration: Whole meds with puree Supervision: Full supervision/cueing for compensatory strategies;Staff to assist with self feeding Compensations: Slow rate;Small sips/bites(drink liquids t/o  meals) Postural Changes: Seated upright at 90 degrees;Remain upright for at least 30 minutes after po intake    Other  Recommendations Oral Care Recommendations: Oral care BID   Follow up Recommendations        Frequency and Duration min 1 x/week  1 week       Prognosis Prognosis for Safe Diet Advancement: Fair Barriers to Reach Goals: Cognitive deficits      Swallow Study   General Date of Onset: 03/03/18 HPI: 84 [facility], decreased po intake, lethargy.  PMH + for Parkinsons disease, dementia, depression, multiple falls, hypoglycemia, stable goiter, possible esophageal narrowing - "choked' on spaghetti in June 2019 prompting ED visit- no endoscopy completed.  Pt did not follow up with GI as daughter reported to neurologist that pt did better. Swallow eval ordered now.  Type of Study: Bedside Swallow Evaluation Diet Prior to this Study: NPO Temperature Spikes Noted: No Respiratory Status: Nasal cannula History of Recent Intubation: No Behavior/Cognition: Alert;Doesn't follow directions(pt has dementia) Oral Cavity Assessment: Within Functional Limits(pt only opens mouth minimally) Oral Care Completed by SLP: No Oral Cavity - Dentition: Adequate natural dentition Self-Feeding Abilities: Total assist Patient Positioning: Upright in bed Baseline Vocal Quality: Normal Volitional Cough: Cognitively unable to elicit Volitional Swallow: Unable to elicit    Oral/Motor/Sensory Function Overall Oral Motor/Sensory Function: Within functional  limits   Ice Chips Ice chips: Not tested Other Comments: pt did not accept x3 attempts   Thin Liquid Thin Liquid: Within  functional limits Presentation: Straw;Spoon    Nectar Thick Nectar Thick Liquid: Not tested   Honey Thick Honey Thick Liquid: Not tested   Puree Puree: Within functional limits Presentation: Spoon Other Comments: pt only took very small bites, suspect mild delay in oral transit/swallow   Solid     Solid: Within functional limits Other Comments: cereal bar - pt accepted small bites      Chales AbrahamsKimball, Carianna Lague Ann 03/03/2018,11:08 AM  Donavan Burnetamara Ailine Hefferan, MS [facility] SLP Acute Rehab Services Pager [phone] Office [phone]

## 2018-03-03 NOTE — Progress Notes (Signed)
Pt's noted lethargic, could not get o2 sats. . Pt's fingers were cold. Applied heating pad and applied pulse ox probe on her finger. o2 sats showed 80-88% on o2 [place]@2lit /min. Rapid response called. Changed pulse ox probe to ear. o2 sats showed 100% . Checked CBG@0606  result-34. Administered 50%dextrose x1 IV push. Rechecked CBG at 0628.it was 141. Notified to [employer] Received order to change her IVF to D5%NS. Pt's V/S @0625  B/P-120/25,HR-68,R-12,T-97.9,o2 sats 100% with o2 [place] 2l/m. Pt still looks sleepy but responsive with voice. Will continue to monitor.

## 2018-03-03 NOTE — Progress Notes (Signed)
Hypoglycemic Event  CBG: 34  Treatment: 50%dex 42ml administered  Symptoms:pt noted lethargic Follow-up CBG: MGNO:0370 CBG Result:141  Possible Reasons for Event:pt is NPO Comments/MD notified:C.Bodenheimer     Zeffie Bickert D Jeane Cashatt

## 2018-03-04 DIAGNOSIS — R1310 Dysphagia, unspecified: Secondary | ICD-10-CM | POA: Diagnosis not present

## 2018-03-04 LAB — CBC
HCT: 38.4 % (ref 36.0–46.0)
Hemoglobin: 11.6 g/dL — ABNORMAL LOW (ref 12.0–15.0)
MCH: 28.5 pg (ref 26.0–34.0)
MCHC: 30.2 g/dL (ref 30.0–36.0)
MCV: 94.3 fL (ref 80.0–100.0)
Platelets: 233 10*3/uL (ref 150–400)
RBC: 4.07 MIL/uL (ref 3.87–5.11)
RDW: 13 % (ref 11.5–15.5)
WBC: 6.9 K/uL (ref 4.0–10.5)
nRBC: 0 % (ref 0.0–0.2)

## 2018-03-04 LAB — BASIC METABOLIC PANEL
Anion gap: 5 (ref 5–15)
BUN: 12 mg/dL (ref 8–23)
CO2: 26 mmol/L (ref 22–32)
Calcium: 8.1 mg/dL — ABNORMAL LOW (ref 8.9–10.3)
Chloride: 108 mmol/L (ref 98–111)
Creatinine, Ser: 0.69 mg/dL (ref 0.44–1.00)
GFR calc Af Amer: >60 mL/min (ref >60)
GFR calc non Af Amer: >60 mL/min (ref >60)
Glucose, Bld: 138 mg/dL — ABNORMAL HIGH (ref 70–99)
Potassium: 3.6 mmol/L (ref 3.5–5.1)
Sodium: 139 mmol/L (ref 135–145)

## 2018-03-04 LAB — MAGNESIUM: Magnesium: 2.1 mg/dL (ref 1.7–2.4)

## 2018-03-04 LAB — T4: T4, Total: 7.7 ug/dL (ref 4.5–12.0)

## 2018-03-04 NOTE — Evaluation (Addendum)
Physical Therapy Evaluation Patient Details Name: Susan Simmons MRN: [id number] DOB: Feb 26, 1934 Today's Date: 03/04/2018   History of Present Illness  84 y.o. female with medical history significant of Parkinson's disease, anxiety/depression, mild dementia who presents from home with daughter for dysphagia, decreased p.o. intake, weakness and found to be hypothermic.    Clinical Impression  Pt admitted with above diagnosis. Pt currently with functional limitations due to the deficits listed below (see PT Problem List). Pt will benefit from skilled PT to increase their independence and safety with mobility to allow discharge to the venue listed below.  Pt requiring TOT A at time of eval. She was fairly lethargic, but did say she got into a w/c at home.  Recommend SNF.     Follow Up Recommendations SNF    Equipment Recommendations       Recommendations for Other Services       Precautions / Restrictions Precautions Precautions: Fall Precaution Comments: sacral wound, pressure wound L shoulder Restrictions Weight Bearing Restrictions: No      Mobility  Bed Mobility Overal bed mobility: Needs Assistance Bed Mobility: Rolling Rolling: Total assist         General bed mobility comments: Pt laying in semi-R sidelying position and does not attempt to A with mobility.  She was straightened up in bed away from bed rail, but still in semi R sidelying position at time of exit.  Transfers                    Ambulation/Gait                Stairs            Wheelchair Mobility    Modified Rankin (Stroke Patients Only)       Balance Overall balance assessment: History of Falls                                           Pertinent Vitals/Pain Pain Assessment: Faces Faces Pain Scale: No hurt    Home Living Family/patient expects to be discharged to:: Skilled nursing facility Living Arrangements: Children                Additional Comments: No family present and pt unable to provide much of a history. She did reply "yes" when asked if she got up to a w/c.    Prior Function Level of Independence: Needs assistance   Gait / Transfers Assistance Needed: No family present, but pt states she did get into a w/c.       Comments: Per ED notes, family reports pt ambulatory.     Hand Dominance        Extremity/Trunk Assessment   Upper Extremity Assessment Upper Extremity Assessment: Generalized weakness(tremors noted)    Lower Extremity Assessment Lower Extremity Assessment: LLE deficits/detail;RLE deficits/detail RLE Deficits / Details: limited hip and knee PROM, she did not perform any AROM with cueing. Noted rigidity. LLE Deficits / Details: limited hip and knee PROM, she did not perform any AROM with cueing. Noted rigidity.       Communication      Cognition Arousal/Alertness: Lethargic   Overall Cognitive Status: No family/caregiver present to determine baseline cognitive functioning  General Comments      Exercises     Assessment/Plan    PT Assessment Patient needs continued PT services  PT Problem List Decreased range of motion;Decreased activity tolerance;Decreased strength;Decreased mobility       PT Treatment Interventions Functional mobility training;Therapeutic activities;Therapeutic exercise;Neuromuscular re-education;Balance training;Patient/family education    PT Goals (Current goals can be found in the Care Plan section)  Acute Rehab PT Goals PT Goal Formulation: Patient unable to participate in goal setting Time For Goal Achievement: 03/18/18 Potential to Achieve Goals: Fair    Frequency Min 2X/week   Barriers to discharge        Co-evaluation               AM-PAC PT "6 Clicks" Mobility  Outcome Measure Help needed turning from your back to your side while in a flat bed without using bedrails?:  Total Help needed moving from lying on your back to sitting on the side of a flat bed without using bedrails?: Total Help needed moving to and from a bed to a chair (including a wheelchair)?: Total Help needed standing up from a chair using your arms (e.g., wheelchair or bedside chair)?: Total Help needed to walk in hospital room?: Total Help needed climbing 3-5 steps with a railing? : Total 6 Click Score: 6    End of Session   Activity Tolerance: Patient limited by lethargy Patient left: in bed;with call bell/phone within reach Nurse Communication: Mobility status PT Visit Diagnosis: Muscle weakness (generalized) (M62.81);Adult, failure to thrive (R62.7);Other symptoms and signs involving the nervous system (G86.761)    Time: 9509-3267 PT Time Calculation (min) (ACUTE ONLY): 14 min   Charges:   PT Evaluation $PT Eval Low Complexity: 1 Low          Lamond Glantz L. Katrinka Blazing, [place] Pager [phone] 03/04/2018   Enzo Montgomery 03/04/2018, 9:22 AM

## 2018-03-04 NOTE — NC FL2 (Signed)
[place] MEDICAID FL2 LEVEL OF CARE SCREENING TOOL     IDENTIFICATION  Patient Name: Susan Simmons Birthdate: [birth year] Sex: female Admission Date (Current Location): 03/02/2018  [facility] [place] Number:  Producer, television/film/video and Address:  [facility],  [address] [place], [place] [address]      Provider Number: [id number]  Attending Physician Name and Address:  Delaine Lame, MD  Relative Name and Phone Number:  [name], [phone]    Current Level of Care: Hospital Recommended Level of Care: Skilled Nursing Facility Prior Approval Number:    Date Approved/Denied:   PASRR Number: [id number]  Discharge Plan:      Current Diagnoses: Patient Active Problem List   Diagnosis Date Noted  . Dysphagia 03/02/2018  . Mediastinal mass 03/02/2018  . Fall 09/25/2016  . Sacral ulcer, with unspecified severity (HCC) 09/25/2016  . Suspected deep tissue injury of unknown depth of heel 09/25/2016  . Dementia due to Parkinson's disease with behavioral disturbance (HCC) 12/19/2015  . Psychosis (HCC) 12/19/2015  . AKI (acute kidney injury) (HCC) 11/22/2015  . Dehydration 11/22/2015  . Parkinsonism (HCC) 01/25/2015  . Abnormality of gait   . Leukopenia 01/07/2015  . Anxiety 11/12/2014  . Malnutrition of moderate degree 11/10/2014  . Pressure ulcer stage II 11/09/2014  . Acute encephalopathy 08/02/2014  . Depression 05/02/2014    Orientation RESPIRATION BLADDER Height & Weight     Self  O2(2L) External catheter, Incontinent Weight: 105 lb 13.1 oz (48 kg) Height:  5\' 4"  (162.6 cm)  BEHAVIORAL SYMPTOMS/MOOD NEUROLOGICAL BOWEL NUTRITION STATUS      Continent Diet(dsy 3)  AMBULATORY STATUS COMMUNICATION OF NEEDS Skin   Extensive Assist Verbally Other (Comment)(sacral wound, pressure wound L shoulder)                       Personal Care Assistance Level of Assistance  Bathing, Feeding, Dressing Bathing Assistance: Maximum assistance Feeding  assistance: Limited assistance Dressing Assistance: Maximum assistance     Functional Limitations Info  Sight, Hearing, Speech Sight Info: Adequate Hearing Info: Adequate Speech Info: Adequate    SPECIAL CARE FACTORS FREQUENCY  PT (By licensed PT), OT (By licensed OT)     PT Frequency: 5x wk OT Frequency: 5x wk            Contractures      Additional Factors Info  Code Status, Allergies Code Status Info: full code Allergies Info: nka           Current Medications (03/04/2018):  This is the current hospital active medication list Current Facility-Administered Medications  Medication Dose Route Frequency Provider Last Rate Last Dose  . bacitracin ointment   Topical BID Purohit, Shrey C, MD      . busPIRone (BUSPAR) tablet 15 mg  15 mg Oral Daily Purohit, Shrey C, MD   15 mg at 03/04/18 1006  . carbidopa-levodopa (SINEMET IR) 25-100 MG per tablet immediate release 1 tablet  1 tablet Oral BID Purohit, Salli Quarry, MD   1 tablet at 03/04/18 1006  . collagenase (SANTYL) ointment   Topical Daily Purohit, Shrey C, MD      . donepezil (ARICEPT) tablet 10 mg  10 mg Oral QHS Purohit, Shrey C, MD   10 mg at 03/03/18 2205  . heparin injection 5,000 Units  5,000 Units Subcutaneous Q8H Purohit, Shrey C, MD   5,000 Units at 03/04/18 1331  . ketorolac (TORADOL) 15 MG/ML injection 15 mg  15 mg  Intravenous Q6H PRN Purohit, Shrey C, MD      . mirtazapine (REMERON) tablet 15 mg  15 mg Oral QHS Purohit, Shrey C, MD   15 mg at 03/03/18 2205  . ondansetron (ZOFRAN) tablet 4 mg  4 mg Oral Q6H PRN Purohit, Shrey C, MD       Or  . ondansetron (ZOFRAN) injection 4 mg  4 mg Intravenous Q6H PRN Purohit, Shrey C, MD      . sodium chloride flush (NS) 0.9 % injection 3 mL  3 mL Intravenous Q12H Purohit, Shrey C, MD   3 mL at 03/04/18 1007     Discharge Medications: Please see discharge summary for a list of discharge medications.  Relevant Imaging Results:  Relevant Lab Results:   Additional  Information SSN:  [id number]  Althea Charon, LCSW

## 2018-03-04 NOTE — Progress Notes (Signed)
PROGRESS NOTE    Susan GasserShirley Simmons  ZOX:[id number] DOB: 12-15-1934 DOA: 03/02/2018 PCP: Susan Heronankins, [name], MD   Brief Narrative:  84 y.o. female with medical history significant of Parkinson's disease, anxiety/depression, mild dementia who presents from home with daughter for dysphagia, decreased p.o. intake, weakness and found to be hypothermic.     Assessment & Plan:   Principal Problem:   Dysphagia Active Problems:   Acute encephalopathy   Malnutrition of moderate degree   Anxiety   Parkinsonism (HCC)   Dementia due to Parkinson's disease with behavioral disturbance (HCC)   Sacral ulcer, with unspecified severity (HCC)   Mediastinal mass  #)  Hypoglycemia/hypothermia: Resolved -IV fluids with D5, discontinue - TSH normal -Random cortisol was 6.7, difficult to interpret  #) Dysphagia: CT scan showed only large goiter that was not impinging -Recommend dysphagia 3 diet with thin liquids -Speech-language pathology consult -IV fluids continue  #) Anxiety/depression: -Continue buspirone 15 mg daily -Continue mirtazapine 15 mg nightly  #) Parkinson's disease: -Continue carbidopa-levodopa twice daily  #) Dementia: -Continue donepezil 10 mg nightly  Fluids: Tolerating p.o. Electrolytes: Monitor and supplement Nutrition: Dysphagia 3 diet with thin liquids  Prophylaxis: SQ heparin  Disposition: Discharge to skilled nursing facility  Full code       Consultants:   None  Procedures:   None  Antimicrobials:   None   Subjective: This morning patient was doing fairly well.  She is sleeping in bed.  She denies any nausea, vomiting, diarrhea, cough, congestion, rhinorrhea.  Objective: Vitals:   03/04/18 0340 03/04/18 0617 03/04/18 0626 03/04/18 0934  BP: (!) 114/57 (!) 142/66  (!) 117/52  Pulse:  70  73  Resp:   (!) 22 20  Temp:  97.9 F (36.6 C)  97.8 F (36.6 C)  TempSrc:  Oral  Oral  SpO2:  100%  (!) 88%  Weight:      Height:         Intake/Output Summary (Last 24 hours) at 03/04/2018 1147 Last data filed at 03/04/2018 1121 Gross per 24 hour  Intake 2797.61 ml  Output 2250 ml  Net 547.61 ml   Filed Weights   03/02/18 1312  Weight: 48 kg    Examination:  General exam: No acute distress Respiratory system: Clear to auscultation. Respiratory effort normal.  Diminished lung sounds at bases Cardiovascular system: Regular rate and rhythm, no murmurs Gastrointestinal system: Soft, nondistended, no rebound or guarding, plus bowel sounds Central nervous system: Alert but not oriented, moving all extremities, cogwheel rigidity and slowing noted Extremities: Symmetric 5 x 5 power. Skin: Bilateral shoulder pressure ulcers and well-healing sacral ulcers Psychiatry: Unable to assess due to medical condition    Data Reviewed: I have personally reviewed following labs and imaging studies  CBC: Recent Labs  Lab 03/02/18 1414 03/04/18 0410  WBC 5.8 6.9  NEUTROABS 3.1  --   HGB 13.5 11.6*  HCT 44.4 38.4  MCV 95.5 94.3  PLT 246 233   Basic Metabolic Panel: Recent Labs  Lab 03/02/18 1414 03/04/18 0410  NA 140 139  K 4.0 3.6  CL 103 108  CO2 28 26  GLUCOSE 92 138*  BUN 23 12  CREATININE 0.81 0.69  CALCIUM 8.8* 8.1*  MG  --  2.1   GFR: Estimated Creatinine Clearance: 41.8 mL/min (by C-G formula based on SCr of 0.69 mg/dL). Liver Function Tests: Recent Labs  Lab 03/02/18 1414  AST 27  ALT 6  ALKPHOS 57  BILITOT 0.7  PROT 7.3  ALBUMIN 3.4*   No results for input(s): LIPASE, AMYLASE in the last 168 hours. Recent Labs  Lab 03/02/18 1414  AMMONIA 39*   Coagulation Profile: No results for input(s): INR, PROTIME in the last 168 hours. Cardiac Enzymes: No results for input(s): CKTOTAL, CKMB, CKMBINDEX, TROPONINI in the last 168 hours. BNP (last 3 results) No results for input(s): PROBNP in the last 8760 hours. HbA1C: No results for input(s): HGBA1C in the last 72 hours. CBG: Recent Labs   Lab 03/03/18 0606 03/03/18 0628 03/03/18 0738  GLUCAP 34* 141* 135*   Lipid Profile: No results for input(s): CHOL, HDL, LDLCALC, TRIG, CHOLHDL, LDLDIRECT in the last 72 hours. Thyroid Function Tests: Recent Labs    03/02/18 1448  TSH 1.000  T4TOTAL 7.7   Anemia Panel: No results for input(s): VITAMINB12, FOLATE, FERRITIN, TIBC, IRON, RETICCTPCT in the last 72 hours. Sepsis Labs: Recent Labs  Lab 03/02/18 1356 03/02/18 1414  LATICACIDVEN 1.2 1.0    Recent Results (from the past 240 hour(s))  Blood Cultures (routine x 2)     Status: None (Preliminary result)   Collection Time: 03/02/18  2:14 PM  Result Value Ref Range Status   Specimen Description   Final    BLOOD RIGHT ANTECUBITAL Performed at [facility], [address] [place], [place] [address]    Special Requests   Final    BOTTLES DRAWN AEROBIC AND ANAEROBIC Blood Culture adequate volume Performed at [facility], [address] [place], [place] [address]    Culture   Final    NO GROWTH 2 DAYS Performed at [facility], [address] [place], [place] [address]    Report Status PENDING  Incomplete  Blood Cultures (routine x 2)     Status: None (Preliminary result)   Collection Time: 03/02/18  2:14 PM  Result Value Ref Range Status   Specimen Description   Final    BLOOD LEFT ANTECUBITAL Performed at [facility], [address] [place], [place] [address]    Special Requests   Final    BOTTLES DRAWN AEROBIC AND ANAEROBIC Blood Culture adequate volume Performed at [facility], [address] [place], [place] [address]    Culture   Final    NO GROWTH 2 DAYS Performed at [facility], [address] [place], [place] [address]    Report Status PENDING  Incomplete         Radiology Studies: Dg Chest 2 View  Result Date: 03/02/2018 CLINICAL DATA:  The patient is unable to swallow this morning. EXAM: CHEST  - 2 VIEW COMPARISON:  11/18/2015 FINDINGS: The trachea is deviated to the right. There is an abnormal convex contour of the left mediastinal border. Mild enlargement of the cardiac silhouette. There is no evidence of focal airspace consolidation, pleural effusion or pneumothorax. Osseous structures are without acute abnormality. Soft tissues are grossly normal. IMPRESSION: Abnormal convex contour of the left mediastinal border, suspicious for a mediastinal soft tissue mass or a vascular abnormality. If further imaging evaluation is desired, CT of the chest with contrast should be considered. Electronically Signed   By: Ted Mcalpineobrinka  Dimitrova M.D.   On: 03/02/2018 15:22   Ct Head Wo Contrast  Result Date: 03/02/2018 CLINICAL DATA:  Unresponsive. EXAM: CT HEAD WITHOUT CONTRAST TECHNIQUE: Contiguous axial images were obtained from the base of the skull through the vertex without intravenous contrast. COMPARISON:  10/14/2015 FINDINGS: Brain: There is some progression of  small vessel disease since the prior study. There is no evidence of hemorrhage, acute infarction, edema, mass effect, extra-axial fluid collection, hydrocephalus or mass lesion. Vascular: No hyperdense vessel or unexpected calcification. Skull: Normal. Negative for fracture or focal lesion. Sinuses/Orbits: No acute finding. Other: None. IMPRESSION: No acute findings. Some progression periventricular small vessel disease is noted since the prior study. Electronically Signed   By: Irish Lack M.D.   On: 03/02/2018 15:07   Ct Chest W Contrast  Result Date: 03/02/2018 CLINICAL DATA:  Abnormal chest x-ray EXAM: CT CHEST WITH CONTRAST TECHNIQUE: Multidetector CT imaging of the chest was performed during intravenous contrast administration. CONTRAST:  47mL OMNIPAQUE IOHEXOL 300 MG/ML  SOLN COMPARISON:  Film from earlier in the same day. FINDINGS: Cardiovascular: Thoracic aorta demonstrates atherosclerotic calcifications without aneurysmal dilatation or  dissection. No cardiac enlargement is seen. Mild coronary calcifications are noted. The pulmonary artery as visualized is within normal limits. Mediastinum/Nodes: Thoracic inlet is within normal limits with the exception of an enlarged thyroid with multiple small nodules within. The overall appearance is stable from prior CT examination from 2017. no hilar or mediastinal adenopathy is noted. The esophagus is within normal limits. Lungs/Pleura: Lungs are well aerated bilaterally without focal infiltrate or sizable effusion. No focal mass lesion is noted. The abnormality seen on the prior chest x-ray is related to patient rotation and a combination of vascular, bony and parenchymal shadows. No mass lesion is seen. Upper Abdomen: Changes of prior cholecystectomy are noted with dilatation of the biliary tree. Musculoskeletal: Degenerative changes of the cervicothoracic spine are seen. No acute bony abnormality is noted. No compression deformities are seen. IMPRESSION: Abnormality seen on recent chest x-ray does not represent a mass but a combination of vascular parenchymal and bony shadows accentuated by patient rotation to the right. Stable goiter dating back to 2017. No acute abnormality noted. Aortic Atherosclerosis (ICD10-I70.0). Electronically Signed   By: Alcide Clever M.D.   On: 03/02/2018 16:37        Scheduled Meds: . bacitracin   Topical BID  . busPIRone  15 mg Oral Daily  . carbidopa-levodopa  1 tablet Oral BID  . collagenase   Topical Daily  . donepezil  10 mg Oral QHS  . heparin  5,000 Units Subcutaneous Q8H  . mirtazapine  15 mg Oral QHS  . sodium chloride flush  3 mL Intravenous Q12H   Continuous Infusions: . dextrose 5 % and 0.9% NaCl 100 mL/hr at 03/04/18 1000     LOS: 2 days    Time spent: 35    Delaine Lame, MD [facility]  If 7PM-7AM, please contact night-coverage [email or web address] Password [facility] 03/04/2018, 11:46 AM

## 2018-03-04 NOTE — Progress Notes (Signed)
  Speech Language Pathology Treatment: Dysphagia  Patient Details Name: Susan Simmons MRN: [id number] DOB: [birth year] Today's Date: 03/04/2018 Time: 8242-3536 SLP Time Calculation (min) (ACUTE ONLY): 10 min  Assessment / Plan / Recommendation Clinical Impression  Pt intake listed as 50% for meals.  She is currently side lying and complains of mild discomfort of bottom - for which she reports recently being adjusted.  Did not provide pt with intake due to aspiration risk with position and dysphagia.  Provided written information for staff/pt regarding compensation strategies.  Recommend continue diet as listed and follow up at SNF if indicated.  No acute follow up at this time.    HPI HPI: 84 yo female adm to [facility] with AMS, decreased po intake, lethargy.  PMH + for Parkinsons disease, dementia, depression, multiple falls, hypoglycemia, stable goiter, possible esophageal narrowing - "choked' on spaghetti in June 2019 prompting ED visit- no endoscopy completed.  Pt did not follow up with GI as daughter reported to neurologist that pt did better. Swallow eval ordered now.       SLP Plan  Discharge SLP treatment due to (comment)(pt has dementia and tolerating po intake, will sign off)       Recommendations  Diet recommendations: Dysphagia 3 (mechanical soft);Thin liquid Liquids provided via: Cup;Straw Medication Administration: Whole meds with puree Supervision: Staff to assist with self feeding Compensations: Minimize environmental distractions;Slow rate;Small sips/bites(drink liquids t/o meal, stay upright for 30 minutes after meals) Postural Changes and/or Swallow Maneuvers: Seated upright 90 degrees;Upright 30-60 min after meal                Oral Care Recommendations: Oral care QID(as pt wil allow) Follow up Recommendations: None SLP Visit Diagnosis: Dysphagia, oropharyngeal phase (R13.12) Plan: Discharge SLP treatment due to (comment)(pt has dementia and tolerating po intake,  will sign off)       GO                Chales Abrahams 03/04/2018, 12:58 PM  Donavan Burnet, MS [facility] SLP Acute Rehab Services Pager [phone] Office [phone]

## 2018-03-05 LAB — CBC
HCT: 36.7 % (ref 36.0–46.0)
Hemoglobin: 11.3 g/dL — ABNORMAL LOW (ref 12.0–15.0)
MCH: 29.4 pg (ref 26.0–34.0)
MCHC: 30.8 g/dL (ref 30.0–36.0)
MCV: 95.3 fL (ref 80.0–100.0)
Platelets: 225 10*3/uL (ref 150–400)
RBC: 3.85 MIL/uL — ABNORMAL LOW (ref 3.87–5.11)
RDW: 13.2 % (ref 11.5–15.5)
WBC: 4.7 10*3/uL (ref 4.0–10.5)
nRBC: 0 % (ref 0.0–0.2)

## 2018-03-05 LAB — BASIC METABOLIC PANEL
Anion gap: 4 — ABNORMAL LOW (ref 5–15)
CO2: 26 mmol/L (ref 22–32)
Calcium: 8.3 mg/dL — ABNORMAL LOW (ref 8.9–10.3)
Creatinine, Ser: 0.73 mg/dL (ref 0.44–1.00)
GFR calc Af Amer: >60 mL/min (ref >60)
GFR calc non Af Amer: >60 mL/min (ref >60)
Glucose, Bld: 85 mg/dL (ref 70–99)
Potassium: 3.7 mmol/L (ref 3.5–5.1)
Sodium: 139 mmol/L (ref 135–145)

## 2018-03-05 LAB — BASIC METABOLIC PANEL WITH GFR
BUN: 14 mg/dL (ref 8–23)
Chloride: 109 mmol/L (ref 98–111)

## 2018-03-05 MED ORDER — DONEPEZIL HCL 10 MG PO TABS: 10.0000 mg | ORAL_TABLET | Freq: Every day | ORAL | 0 refills | Status: AC

## 2018-03-05 MED ORDER — CARBIDOPA-LEVODOPA 25-100 MG PO TABS: 1.0000 | ORAL_TABLET | Freq: Two times a day (BID) | ORAL | 1 refills | Status: DC

## 2018-03-05 MED ORDER — COLLAGENASE 250 UNIT/GM EX OINT: TOPICAL_OINTMENT | Freq: Every day | CUTANEOUS | 0 refills | Status: DC

## 2018-03-05 MED ORDER — MIRTAZAPINE 15 MG PO TABS: 15.0000 mg | ORAL_TABLET | Freq: Every day | ORAL | 0 refills | Status: AC

## 2018-03-05 MED ORDER — BUSPIRONE HCL 15 MG PO TABS: 15.0000 mg | ORAL_TABLET | Freq: Every day | ORAL | 0 refills | Status: AC

## 2018-03-05 NOTE — Care Management CC44 (Deleted)
Condition Code 44 Documentation Completed  Patient Details  Name: Susan Simmons MRN: [id number] Date of Birth: [birth year]   Condition Code 44 given:    Patient signature on Condition Code 44 notice:    Documentation of 2 MD's agreement:    Code 44 added to claim:       Bartholome Bill, RN 03/05/2018, 9:51 AM

## 2018-03-05 NOTE — Discharge Summary (Signed)
Physician Discharge Summary  Susan Simmons LNL:[id number] DOB: 05-23-1934 DOA: 03/02/2018  PCP: Clayborn Heron, MD  Admit date: 03/02/2018 Discharge date: 03/05/2018  Time spent: 25 minutes  Recommendations for Outpatient Follow-up:  1. Needs repeat CXR 2 vw in 1-2 Weeks 2. Needs oxygen 2 l at rest 3. Dress wounds with Santyl shoulders bilaterally and keep bottom dry 4. Needs CBC and basic metabolic panel 1 week  Discharge Diagnoses:  Principal Problem:   Dysphagia Active Problems:   Acute encephalopathy   Malnutrition of moderate degree   Anxiety   Parkinsonism (HCC)   Dementia due to Parkinson's disease with behavioral disturbance (HCC)   Sacral ulcer, with unspecified severity (HCC)   Mediastinal mass   Discharge Condition: Guarded  Diet recommendation: Regular  Filed Weights   03/02/18 1312  Weight: 48 kg    History of present illness:  84 year old female with Parkinson's disease and Parkinson's dementia bipolar-1/26 with hypothermia weakness and poor p.o. intake She has been having increasing rumination regurgitation for the past 24 to 48 hours at baseline is able to ambulate very slowly and was off of her Parkinson meds 1 week prior to admission  Hospital Course:  She was admitted, speech therapy saw the patient 3 diet CT scan showed a large goiter and she was recommended dysphagia 3 with thin liquids she had complication of hypoglycemia and was started on D5 but this resolved that she picked up her appetite Her medications for bipolar buspirone and Remeron were continued she was also continued on her Parkinson's meds and dementia meds She was stabilized for discharge to a skilled facility She will need further follow-up in the outpatient setting with may be a chest x-ray in 1 month given abnormality I discussed with the daughter who understands clearly she will be discharging to skilled facility when available    Discharge Exam: Vitals:   03/04/18 2132  03/05/18 0525  BP: 123/70 139/79  Pulse: 81 76  Resp: 15 15  Temp: 98.4 F (36.9 C) 97.9 F (36.6 C)  SpO2: 97% 95%    General: Awake alert pleasant no distress EOMI NCAT Cardiovascular: S1-S2 no murmur rub or gallop Respiratory: Clinically clear no added sound no rales no rhonchi Abdomen soft nontender no rebound no guarding Bilateral shoulders have wounds Chest is clear No lower extremity edema  Discharge Instructions   Discharge Instructions    Diet - low sodium heart healthy   Complete by:  As directed    Increase activity slowly   Complete by:  As directed      Allergies as of 03/05/2018   No Known Allergies     Medication List    TAKE these medications   busPIRone 15 MG tablet Commonly known as:  BUSPAR Take 1 tablet (15 mg total) by mouth daily.   carbidopa-levodopa 25-100 MG tablet Commonly known as:  SINEMET IR Take 1 tablet by mouth 2 (two) times daily.   collagenase ointment Commonly known as:  SANTYL Apply topically daily.   donepezil 10 MG tablet Commonly known as:  ARICEPT Take 1 tablet (10 mg total) by mouth at bedtime.   ENSURE Take 237 mLs by mouth 3 (three) times daily between meals. One can three times daily to promote wound healing and support weight status   mirtazapine 15 MG tablet Commonly known as:  REMERON Take 1 tablet (15 mg total) by mouth at bedtime.   vitamin C 500 MG tablet Commonly known as:  ASCORBIC ACID Take 500 mg by  mouth daily. Take one tablet twice daily      No Known Allergies    The results of significant diagnostics from this hospitalization (including imaging, microbiology, ancillary and laboratory) are listed below for reference.    Significant Diagnostic Studies: Dg Chest 2 View  Result Date: 03/02/2018 CLINICAL DATA:  The patient is unable to swallow this morning. EXAM: CHEST - 2 VIEW COMPARISON:  11/18/2015 FINDINGS: The trachea is deviated to the right. There is an abnormal convex contour of the  left mediastinal border. Mild enlargement of the cardiac silhouette. There is no evidence of focal airspace consolidation, pleural effusion or pneumothorax. Osseous structures are without acute abnormality. Soft tissues are grossly normal. IMPRESSION: Abnormal convex contour of the left mediastinal border, suspicious for a mediastinal soft tissue mass or a vascular abnormality. If further imaging evaluation is desired, CT of the chest with contrast should be considered. Electronically Signed   By: Ted Mcalpineobrinka  Dimitrova M.D.   On: 03/02/2018 15:22   Ct Head Wo Contrast  Result Date: 03/02/2018 CLINICAL DATA:  Unresponsive. EXAM: CT HEAD WITHOUT CONTRAST TECHNIQUE: Contiguous axial images were obtained from the base of the skull through the vertex without intravenous contrast. COMPARISON:  10/14/2015 FINDINGS: Brain: There is some progression of small vessel disease since the prior study. There is no evidence of hemorrhage, acute infarction, edema, mass effect, extra-axial fluid collection, hydrocephalus or mass lesion. Vascular: No hyperdense vessel or unexpected calcification. Skull: Normal. Negative for fracture or focal lesion. Sinuses/Orbits: No acute finding. Other: None. IMPRESSION: No acute findings. Some progression periventricular small vessel disease is noted since the prior study. Electronically Signed   By: Irish LackGlenn  Yamagata M.D.   On: 03/02/2018 15:07   Ct Chest W Contrast  Result Date: 03/02/2018 CLINICAL DATA:  Abnormal chest x-ray EXAM: CT CHEST WITH CONTRAST TECHNIQUE: Multidetector CT imaging of the chest was performed during intravenous contrast administration. CONTRAST:  75mL OMNIPAQUE IOHEXOL 300 MG/ML  SOLN COMPARISON:  Film from earlier in the same day. FINDINGS: Cardiovascular: Thoracic aorta demonstrates atherosclerotic calcifications without aneurysmal dilatation or dissection. No cardiac enlargement is seen. Mild coronary calcifications are noted. The pulmonary artery as visualized is  within normal limits. Mediastinum/Nodes: Thoracic inlet is within normal limits with the exception of an enlarged thyroid with multiple small nodules within. The overall appearance is stable from prior CT examination from 2017. no hilar or mediastinal adenopathy is noted. The esophagus is within normal limits. Lungs/Pleura: Lungs are well aerated bilaterally without focal infiltrate or sizable effusion. No focal mass lesion is noted. The abnormality seen on the prior chest x-ray is related to patient rotation and a combination of vascular, bony and parenchymal shadows. No mass lesion is seen. Upper Abdomen: Changes of prior cholecystectomy are noted with dilatation of the biliary tree. Musculoskeletal: Degenerative changes of the cervicothoracic spine are seen. No acute bony abnormality is noted. No compression deformities are seen. IMPRESSION: Abnormality seen on recent chest x-ray does not represent a mass but a combination of vascular parenchymal and bony shadows accentuated by patient rotation to the right. Stable goiter dating back to 2017. No acute abnormality noted. Aortic Atherosclerosis (ICD10-I70.0). Electronically Signed   By: Alcide CleverMark  Lukens M.D.   On: 03/02/2018 16:37    Microbiology: Recent Results (from the past 240 hour(s))  Blood Cultures (routine x 2)     Status: None (Preliminary result)   Collection Time: 03/02/18  2:14 PM  Result Value Ref Range Status   Specimen Description   Final  BLOOD RIGHT ANTECUBITAL Performed at [facility], [address], [place], [place] [address]    Special Requests   Final    BOTTLES DRAWN AEROBIC AND ANAEROBIC Blood Culture adequate volume Performed at [facility], [address] [place], [place] [address]    Culture   Final    NO GROWTH 3 DAYS Performed at [facility], [address], [place], [place] [address]    Report Status PENDING  Incomplete  Blood Cultures (routine x 2)     Status: None  (Preliminary result)   Collection Time: 03/02/18  2:14 PM  Result Value Ref Range Status   Specimen Description   Final    BLOOD LEFT ANTECUBITAL Performed at [facility], [address] [place], [place] [address]    Special Requests   Final    BOTTLES DRAWN AEROBIC AND ANAEROBIC Blood Culture adequate volume Performed at [facility], [address] [place], [place] [address]    Culture   Final    NO GROWTH 3 DAYS Performed at [facility], [address], [place], [place] [address]    Report Status PENDING  Incomplete     Labs: Basic Metabolic Panel: Recent Labs  Lab 03/02/18 1414 03/04/18 0410 03/05/18 0513  NA 140 139 139  K 4.0 3.6 3.7  CL 103 108 109  CO2 28 26 26   GLUCOSE 92 138* 85  BUN 23 12 14   CREATININE 0.81 0.69 0.73  CALCIUM 8.8* 8.1* 8.3*  MG  --  2.1  --    Liver Function Tests: Recent Labs  Lab 03/02/18 1414  AST 27  ALT 6  ALKPHOS 57  BILITOT 0.7  PROT 7.3  ALBUMIN 3.4*   No results for input(s): LIPASE, AMYLASE in the last 168 hours. Recent Labs  Lab 03/02/18 1414  AMMONIA 39*   CBC: Recent Labs  Lab 03/02/18 1414 03/04/18 0410 03/05/18 0513  WBC 5.8 6.9 4.7  NEUTROABS 3.1  --   --   HGB 13.5 11.6* 11.3*  HCT 44.4 38.4 36.7  MCV 95.5 94.3 95.3  PLT 246 233 225   Cardiac Enzymes: No results for input(s): CKTOTAL, CKMB, CKMBINDEX, TROPONINI in the last 168 hours. BNP: BNP (last 3 results) No results for input(s): BNP in the last 8760 hours.  ProBNP (last 3 results) No results for input(s): PROBNP in the last 8760 hours.  CBG: Recent Labs  Lab 03/03/18 0606 03/03/18 0628 03/03/18 0738  GLUCAP 34* 141* 135*       Signed:  Rhetta Mura MD   [facility] 03/05/2018, 9:25 AM

## 2018-03-05 NOTE — Care Management CC44 (Addendum)
Condition Code 44 Documentation Completed  Patient Details  Name: Susan Simmons MRN: [id number] Date of Birth: 1934-02-28   Condition Code 44 given:  Yes Patient signature on Condition Code 44 notice:  Yes Unable to sign, confused. No family available. Copy left in room. Documentation of 2 MD's agreement:  Yes Code 44 added to claim:  Yes    Bartholome Bill, RN 03/05/2018, 9:52 AM

## 2018-03-05 NOTE — Clinical Social Work Note (Signed)
Clinical Social Work Assessment  Patient Details  Name: Susan Simmons MRN: [id number] Date of Birth: [birth year]  Date of referral:  03/05/18               Reason for consult:  Discharge Planning, Facility Placement                Permission sought to share information with:  Family Supports Permission granted to share information::     Name::     Susan Simmons  Agency::     Relationship::  [phone]  Contact Information:  daughter  Housing/Transportation Living arrangements for the past 2 months:  Single Family Home Source of Information:  Patient Patient Interpreter Needed:  None Criminal Activity/Legal Involvement Pertinent to Current Situation/Hospitalization:  No - Comment as needed Significant Relationships:  Adult Children Lives with:  Adult Children Do you feel safe going back to the place where you live?  Yes Need for family participation in patient care:  Yes (Comment)  Care giving concerns:  No family at bedside. CSW spoke with [name] patients daughter via phone since patient is only orient to self.   Social Worker assessment / plan:  Patients daughter Susan Simmons stated she takes care of patient at home and feels that a short stay at rehab would help patient. Daughter stated she would like [address]. CSW has reached out to [facility] but they stated that facility is completely full and will be unable to take patient. CSW will reach out to [facility] to check on bed availability.  Employment status:  Retired Database administrator PT Recommendations:  Skilled Nursing Facility Information / Referral to community resources:  Skilled Nursing Facility  Patient/Family's Response to care:  Daughter appreciates CSW role in care  Patient/Family's Understanding of and Emotional Response to Diagnosis, Current Treatment, and Prognosis:  Family agreeable with discharge to SNF   Emotional Assessment Appearance:  Appears stated  age Attitude/Demeanor/Rapport:  Unable to Assess Affect (typically observed):  Unable to Assess Orientation:  Oriented to Self Alcohol / Substance use:  Not Applicable Psych involvement (Current and /or in the community):  No (Comment)  Discharge Needs  Concerns to be addressed:  Care Coordination Readmission within the last 30 days:  No Current discharge risk:  Dependent with Mobility Barriers to Discharge:  Insurance Authorization   Althea Charon, LCSW 03/05/2018, 11:04 AM

## 2018-03-05 NOTE — Care Management Obs Status (Addendum)
MEDICARE OBSERVATION STATUS NOTIFICATION   Patient Details  Name: Susan Simmons MRN: [id number] Date of Birth: 07-24-1934   Medicare Observation Status Notification Given:  Yes Unable to sign, confused. No family available. Copy left in room.   Bartholome Bill, RN 03/05/2018, 9:52 AM

## 2018-03-06 ENCOUNTER — Observation Stay (HOSPITAL_COMMUNITY): Payer: Medicare Other

## 2018-03-06 MED ORDER — ACETAMINOPHEN 160 MG/5ML PO SOLN
1000.0000 mg | Freq: Once | ORAL | Status: AC
  Administered 2018-03-06: 1000 mg via ORAL
  Filled 2018-03-06: qty 40.6

## 2018-03-06 MED ORDER — SODIUM CHLORIDE 0.9 % IV BOLUS
1000.0000 mL | Freq: Once | INTRAVENOUS | Status: AC
  Administered 2018-03-06: 1000 mL via INTRAVENOUS

## 2018-03-06 NOTE — Progress Notes (Signed)
Pt spiked a temp of 101 with a BP of 99/44 MAP of 57. MEWS of 5. Pt in NAD. Ordered 1000 mg of Tylenol x 1, 1 Liter NS bolus, Blood cultures x2 , and a CXR.  Stevie Kern AGPCNP-BC, AGNP-C [facility] Pager [phone]

## 2018-03-06 NOTE — Progress Notes (Signed)
Physical Therapy Treatment Patient Details Name: Susan Simmons MRN: [id number] DOB: [birth year] Today's Date: 03/06/2018    History of Present Illness 84 y.o. female with medical history significant of Parkinson's disease, anxiety/depression, mild dementia who presents from home with daughter for dysphagia, decreased p.o. intake, weakness and found to be hypothermic.      PT Comments    Patient seen to attempt to progress mobility. Patient with eyes closed throughout and only stating "yes" or "no" to simple questions. Patient soiled with BM and urine with nursing and PT performing pericare and repositioning to prevent further skin breakdown. Requires total A +2 for all bed level mobility. Will continue to recommend SNF at discharge.     Follow Up Recommendations  SNF     Equipment Recommendations  Other (comment)(defer)    Recommendations for Other Services       Precautions / Restrictions Precautions Precautions: Fall Precaution Comments: sacral wound, pressure wound L shoulder Restrictions Weight Bearing Restrictions: No    Mobility  Bed Mobility Overal bed mobility: Needs Assistance Bed Mobility: Rolling Rolling: Total assist;+2 for physical assistance         General bed mobility comments: nursing assist with rolling for peri-care and repositioning  Transfers                 General transfer comment: deferred  Ambulation/Gait                 Stairs             Wheelchair Mobility    Modified Rankin (Stroke Patients Only)       Balance Overall balance assessment: History of Falls                                          Cognition Arousal/Alertness: Lethargic Behavior During Therapy: Flat affect Overall Cognitive Status: No family/caregiver present to determine baseline cognitive functioning                                 General Comments: will say "yes" and "no"       Exercises       General Comments General comments (skin integrity, edema, etc.): nursing changing dressings with PT in room      Pertinent Vitals/Pain Pain Assessment: Faces Faces Pain Scale: Hurts little more Pain Location: generalized with rolling Pain Descriptors / Indicators: Grimacing Pain Intervention(s): Limited activity within patient's tolerance;Monitored during session;Repositioned    Home Living                      Prior Function            PT Goals (current goals can now be found in the care plan section) Acute Rehab PT Goals Patient Stated Goal: none stated PT Goal Formulation: Patient unable to participate in goal setting Time For Goal Achievement: 03/18/18 Potential to Achieve Goals: Fair Progress towards PT goals: Progressing toward goals    Frequency    Min 2X/week      PT Plan Current plan remains appropriate    Co-evaluation              AM-PAC PT "6 Clicks" Mobility   Outcome Measure  Help needed turning from your back to your side while in a flat bed without using bedrails?: Total  Help needed moving from lying on your back to sitting on the side of a flat bed without using bedrails?: Total Help needed moving to and from a bed to a chair (including a wheelchair)?: Total Help needed standing up from a chair using your arms (e.g., wheelchair or bedside chair)?: Total Help needed to walk in hospital room?: Total Help needed climbing 3-5 steps with a railing? : Total 6 Click Score: 6    End of Session Equipment Utilized During Treatment: Oxygen Activity Tolerance: Patient limited by lethargy Patient left: in bed;with call bell/phone within reach;with nursing/sitter in room Nurse Communication: Mobility status PT Visit Diagnosis: Muscle weakness (generalized) (M62.81);Adult, failure to thrive (R62.7);Other symptoms and signs involving the nervous system (R29.898)     Time: 3220-2542 PT Time Calculation (min) (ACUTE ONLY): 19 min  Charges:   $Therapeutic Activity: 8-22 mins                      Kipp Laurence, PT, DPT Supplemental Physical Therapist 03/06/18 1:50 PM Pager: [phone] Office: [phone]

## 2018-03-06 NOTE — Plan of Care (Signed)
Plan of care reviewed.  The patient has limited cognitive ability that prevents her from understanding and retaining new information.

## 2018-03-06 NOTE — Progress Notes (Signed)
Seen and examined no acute changes tolerating 40 to 80% of diet however sugars 80s 200s otherwise is stabilized for discharge whenever skilled facility can be obtained Social worker is aware

## 2018-03-07 ENCOUNTER — Observation Stay (HOSPITAL_COMMUNITY): Payer: Medicare Other

## 2018-03-07 DIAGNOSIS — L89112 Pressure ulcer of right upper back, stage 2: Secondary | ICD-10-CM | POA: Diagnosis present

## 2018-03-07 DIAGNOSIS — E440 Moderate protein-calorie malnutrition: Secondary | ICD-10-CM | POA: Diagnosis present

## 2018-03-07 DIAGNOSIS — Z79899 Other long term (current) drug therapy: Secondary | ICD-10-CM | POA: Diagnosis not present

## 2018-03-07 DIAGNOSIS — E049 Nontoxic goiter, unspecified: Secondary | ICD-10-CM | POA: Diagnosis present

## 2018-03-07 DIAGNOSIS — L89893 Pressure ulcer of other site, stage 3: Secondary | ICD-10-CM | POA: Diagnosis present

## 2018-03-07 DIAGNOSIS — L89120 Pressure ulcer of left upper back, unstageable: Secondary | ICD-10-CM | POA: Diagnosis present

## 2018-03-07 DIAGNOSIS — Z87891 Personal history of nicotine dependence: Secondary | ICD-10-CM | POA: Diagnosis not present

## 2018-03-07 DIAGNOSIS — R1310 Dysphagia, unspecified: Secondary | ICD-10-CM | POA: Diagnosis present

## 2018-03-07 DIAGNOSIS — G9341 Metabolic encephalopathy: Secondary | ICD-10-CM | POA: Diagnosis present

## 2018-03-07 DIAGNOSIS — G20 Parkinson's disease: Secondary | ICD-10-CM | POA: Diagnosis present

## 2018-03-07 DIAGNOSIS — R680 Hypothermia, not associated with low environmental temperature: Secondary | ICD-10-CM | POA: Diagnosis present

## 2018-03-07 DIAGNOSIS — L89153 Pressure ulcer of sacral region, stage 3: Secondary | ICD-10-CM | POA: Diagnosis present

## 2018-03-07 DIAGNOSIS — Z681 Body mass index (BMI) 19 or less, adult: Secondary | ICD-10-CM | POA: Diagnosis not present

## 2018-03-07 DIAGNOSIS — E162 Hypoglycemia, unspecified: Secondary | ICD-10-CM | POA: Diagnosis present

## 2018-03-07 DIAGNOSIS — F0281 Dementia in other diseases classified elsewhere with behavioral disturbance: Secondary | ICD-10-CM | POA: Diagnosis present

## 2018-03-07 DIAGNOSIS — R4702 Dysphasia: Secondary | ICD-10-CM | POA: Diagnosis present

## 2018-03-07 DIAGNOSIS — A419 Sepsis, unspecified organism: Secondary | ICD-10-CM | POA: Diagnosis not present

## 2018-03-07 DIAGNOSIS — F329 Major depressive disorder, single episode, unspecified: Secondary | ICD-10-CM | POA: Diagnosis present

## 2018-03-07 DIAGNOSIS — F419 Anxiety disorder, unspecified: Secondary | ICD-10-CM | POA: Diagnosis present

## 2018-03-07 LAB — COMPREHENSIVE METABOLIC PANEL
ALT: 6 U/L (ref 0–44)
AST: 20 U/L (ref 15–41)
Albumin: 2.4 g/dL — ABNORMAL LOW (ref 3.5–5.0)
Alkaline Phosphatase: 46 U/L (ref 38–126)
Anion gap: 6 (ref 5–15)
BUN: 25 mg/dL — AB (ref 8–23)
CO2: 28 mmol/L (ref 22–32)
CREATININE: 0.96 mg/dL (ref 0.44–1.00)
Calcium: 8.3 mg/dL — ABNORMAL LOW (ref 8.9–10.3)
Chloride: 105 mmol/L (ref 98–111)
GFR, EST NON AFRICAN AMERICAN: 55 mL/min — AB (ref >60)
Glucose, Bld: 110 mg/dL — ABNORMAL HIGH (ref 70–99)
Potassium: 4 mmol/L (ref 3.5–5.1)
Sodium: 139 mmol/L (ref 135–145)
Total Bilirubin: 0.3 mg/dL (ref 0.3–1.2)
Total Protein: 5.5 g/dL — ABNORMAL LOW (ref 6.5–8.1)

## 2018-03-07 LAB — CBC WITH DIFFERENTIAL/PLATELET
ABS IMMATURE GRANULOCYTES: 0.03 10*3/uL (ref 0.00–0.07)
Basophils Absolute: 0 10*3/uL (ref 0.0–0.1)
Basophils Relative: 0 %
Eosinophils Absolute: 0.2 10*3/uL (ref 0.0–0.5)
Eosinophils Relative: 3 %
HCT: 36.9 % (ref 36.0–46.0)
Hemoglobin: 11 g/dL — ABNORMAL LOW (ref 12.0–15.0)
IMMATURE GRANULOCYTES: 0 %
Lymphocytes Relative: 15 %
Lymphs Abs: 1.3 10*3/uL (ref 0.7–4.0)
MCH: 28.3 pg (ref 26.0–34.0)
MCHC: 29.8 g/dL — ABNORMAL LOW (ref 30.0–36.0)
MCV: 94.9 fL (ref 80.0–100.0)
MONO ABS: 0.7 10*3/uL (ref 0.1–1.0)
Monocytes Relative: 8 %
NEUTROS ABS: 6.3 10*3/uL (ref 1.7–7.7)
Neutrophils Relative %: 74 %
Platelets: 240 10*3/uL (ref 150–400)
RBC: 3.89 MIL/uL (ref 3.87–5.11)
RDW: 13.4 % (ref 11.5–15.5)
WBC: 8.6 10*3/uL (ref 4.0–10.5)
nRBC: 0 % (ref 0.0–0.2)

## 2018-03-07 LAB — CULTURE, BLOOD (ROUTINE X 2)
Culture: NO GROWTH
Culture: NO GROWTH
Special Requests: ADEQUATE
Special Requests: ADEQUATE

## 2018-03-07 MED ORDER — HEPARIN SODIUM (PORCINE) 5000 UNIT/ML IJ SOLN
5000.0000 [IU] | Freq: Three times a day (TID) | INTRAMUSCULAR | Status: DC
  Administered 2018-03-08 – 2018-03-10 (×8): 5000 [IU] via SUBCUTANEOUS
  Filled 2018-03-07 (×8): qty 1

## 2018-03-07 MED ORDER — SODIUM CHLORIDE 0.9 % IV SOLN
1.5000 g | Freq: Three times a day (TID) | INTRAVENOUS | Status: DC
  Administered 2018-03-07 – 2018-03-09 (×5): 1.5 g via INTRAVENOUS
  Filled 2018-03-07 (×6): qty 1.5

## 2018-03-07 MED ORDER — SODIUM CHLORIDE 0.9 % IV SOLN
INTRAVENOUS | Status: DC | PRN
  Administered 2018-03-07: 250 mL via INTRAVENOUS
  Administered 2018-03-08: 500 mL via INTRAVENOUS

## 2018-03-07 MED ORDER — POLYETHYLENE GLYCOL 3350 17 G PO PACK
17.0000 g | PACK | Freq: Every day | ORAL | Status: DC
  Administered 2018-03-07 – 2018-03-10 (×4): 17 g via ORAL
  Filled 2018-03-07 (×4): qty 1

## 2018-03-07 MED ORDER — SODIUM CHLORIDE 0.9 % IV SOLN
3.0000 g | Freq: Once | INTRAVENOUS | Status: AC
  Administered 2018-03-07: 3 g via INTRAVENOUS
  Filled 2018-03-07: qty 3

## 2018-03-07 NOTE — Progress Notes (Signed)
Pharmacy Antibiotic Note  Susan Simmons is a 84 y.o. female admitted on 03/02/2018 with dysphagia.  Pharmacy has been consulted for Unasyn dosing for aspiration PNA. CXR 1/30 PM: L base atelectasis. Tmax 101 w. BP 99/44  Plan: Unasyn 3 gm loading dose followed by Unasyn 1.5 gm IV q8h Pharmacy to sign off  Height: 5\' 4"  (162.6 cm) Weight: 105 lb 13.1 oz (48 kg) IBW/kg (Calculated) : 54.7  Temp (24hrs), Avg:98.3 F (36.8 C), Min:97.4 F (36.3 C), Max:101 F (38.3 C)  Recent Labs  Lab 03/02/18 1356 03/02/18 1414 03/04/18 0410 03/05/18 0513  WBC  --  5.8 6.9 4.7  CREATININE  --  0.81 0.69 0.73  LATICACIDVEN 1.2 1.0  --   --     Estimated Creatinine Clearance: 41.8 mL/min (by C-G formula based on SCr of 0.73 mg/dL).    No Known Allergies   Thank you for allowing pharmacy to be a part of this patient's care.  Herby Abraham, Pharm.D [phone] 03/07/2018 9:22 AM

## 2018-03-07 NOTE — Progress Notes (Signed)
MD notified of rt arm swollen on pt.  Arm is not red or hot to the touch.  Arm is elevated, will continue to monitor.

## 2018-03-07 NOTE — Progress Notes (Signed)
[facility] PROGRESS NOTE  Susan Simmons TKZ:[id number] DOB: 11/10/1934 DOA: 03/02/2018 PCP: Clayborn Heron, MD   Narrative: 84 year old female with Parkinson's disease Parkinson's dementia bipolar admitted with hypothermia weakness and significant hypoglycemia on admission she was started on D5 She was discharged but then spiked a fever  101 with a blood pressure of 99/44 and blood cultures and x-ray were ordered   A & Plan ?  Aspiration leading to sepsis Nursing reports that patient was fed 2 whole trays of food on 1/30 by daughter and I suspect aspiration despite negative x-ray Patient also hypotensive and given IVF  Bolus and rate Starting Unasyn 1/31 monitor and repeat chest x-ray in a.m. Cycle CBC a.m. Hypoglycemia This is resolved and sugars are in the 85-1 40 range Parkinson's with dementia Continuing Sinemet IR 1 tab twice daily, continue Aricept 10 daily buspirone 15 daily and Remeron as well Bipolar See above Multiple unstageable full-thickness wounds on shoulders bilaterally in addition to right ear Wounds reviewed personally by me 1/31 she has a tunneling wound in her sacrum but it has no slough or drainage the wound on the left shoulder has some slough but again no drainage Encounter for palliative care I had a discussion with daughter today regarding goals of care Moderate to severe malnutrition BMI18  DVT heparin  Code Status: Full  Communication: called to daughter and updated  Disposition Plan:  Inpatient--not ready for discharge as fever    Mahala Menghini, MD  [facility] Via [employer] app OR [email or web address] 7PM-7AM contact night coverage as above 03/07/2018, 8:38 AM  LOS: 3 days   Consultants:  n  Procedures:  n  Antimicrobials:  Unasyn started  Interval history/Subjective: Awakens but minimally responsive  Objective:  Vitals:  Vitals:   03/07/18 0436 03/07/18 0642  BP: (!) 83/44 (!) 93/46  Pulse: 60 61  Resp: 18 20  Temp: (!) 97.5  F (36.4 C) 97.6 F (36.4 C)  SpO2: 98% 100%    Exam:   eomi ncat no distress no icterus no pallor cta b n added  abd soft scaphopid L shoudler 5x 7 wound Lateral aspect R shoulder looks better Sacrum deep tunneling wound at natal cleft   I have personally reviewed the following:  DATA   Imaging studies:  CXR reviewed shows no significant large PNA maybe some L sided and R sdied atelecatasis   Scheduled Meds: . bacitracin   Topical BID  . busPIRone  15 mg Oral Daily  . carbidopa-levodopa  1 tablet Oral BID  . collagenase   Topical Daily  . donepezil  10 mg Oral QHS  . heparin  5,000 Units Subcutaneous Q8H  . mirtazapine  15 mg Oral QHS  . sodium chloride flush  3 mL Intravenous Q12H   Continuous Infusions:  Principal Problem:   Dysphagia Active Problems:   Acute encephalopathy   Malnutrition of moderate degree   Anxiety   Parkinsonism (HCC)   Dementia due to Parkinson's disease with behavioral disturbance (HCC)   Sacral ulcer, with unspecified severity (HCC)   Mediastinal mass   LOS: 3 days

## 2018-03-07 NOTE — Progress Notes (Signed)
Patient's MEWS score is 5. T:101 BP: 99/44, RR: 26.  C. Bodenheimer, NP notified and orders received and being implemented.

## 2018-03-07 NOTE — Progress Notes (Signed)
Clinical Social Worker following patient for support and discharge needs. CSW received a phone call from [facility] stating that patient insurance has denied her claim to discharge to a skilled nursing facility for rehab. CSW made MD and RNCM aware of denial. CSW signing off as social work needs have been met.   Rhea Pink, MSW,  [place]

## 2018-03-08 LAB — CBC WITH DIFFERENTIAL/PLATELET
Abs Immature Granulocytes: 0.02 10*3/uL (ref 0.00–0.07)
Basophils Absolute: 0 10*3/uL (ref 0.0–0.1)
Basophils Relative: 0 %
EOS ABS: 0.1 10*3/uL (ref 0.0–0.5)
Eosinophils Relative: 1 %
HCT: 46.5 % — ABNORMAL HIGH (ref 36.0–46.0)
Hemoglobin: 14.3 g/dL (ref 12.0–15.0)
IMMATURE GRANULOCYTES: 0 %
Lymphocytes Relative: 12 %
Lymphs Abs: 1 10*3/uL (ref 0.7–4.0)
MCH: 28.8 pg (ref 26.0–34.0)
MCHC: 30.8 g/dL (ref 30.0–36.0)
MCV: 93.8 fL (ref 80.0–100.0)
Monocytes Absolute: 0.4 10*3/uL (ref 0.1–1.0)
Monocytes Relative: 5 %
NEUTROS PCT: 82 %
Neutro Abs: 6.7 10*3/uL (ref 1.7–7.7)
Platelets: 223 10*3/uL (ref 150–400)
RBC: 4.96 MIL/uL (ref 3.87–5.11)
RDW: 13.6 % (ref 11.5–15.5)
WBC: 8.2 10*3/uL (ref 4.0–10.5)
nRBC: 0 % (ref 0.0–0.2)

## 2018-03-08 LAB — RENAL FUNCTION PANEL
Albumin: 3 g/dL — ABNORMAL LOW (ref 3.5–5.0)
Anion gap: 12 (ref 5–15)
BUN: 22 mg/dL (ref 8–23)
CO2: 26 mmol/L (ref 22–32)
Calcium: 9 mg/dL (ref 8.9–10.3)
Chloride: 104 mmol/L (ref 98–111)
Creatinine, Ser: 0.79 mg/dL (ref 0.44–1.00)
GFR calc Af Amer: >60 mL/min (ref >60)
GFR calc non Af Amer: >60 mL/min (ref >60)
Glucose, Bld: 95 mg/dL (ref 70–99)
Phosphorus: 3 mg/dL (ref 2.5–4.6)
Potassium: 4.6 mmol/L (ref 3.5–5.1)
Sodium: 142 mmol/L (ref 135–145)

## 2018-03-08 NOTE — Progress Notes (Signed)
I have reviewed and concur with this student's documentation.   

## 2018-03-08 NOTE — Progress Notes (Signed)
Nursing student from [facility] changed all dressings with instructor this shift, except for sacral dressing.

## 2018-03-08 NOTE — Progress Notes (Signed)
[facility] PROGRESS NOTE  Wylodene Burningham [id number]:[id number] DOB: 1934-09-10 DOA: 03/02/2018 PCP: Clayborn Heron, MD   Narrative: 84 year old female with Parkinson's disease Parkinson's dementia bipolar admitted with hypothermia weakness and significant hypoglycemia on admission she was started on D5 She was discharged but then spiked a fever  101 with a blood pressure of 99/44 and blood cultures and x-ray were ordered   A & Plan ?  Aspiration leading to sepsis Aspiration event on 1/31 with hypotension No better-eating some but listless No fever overnight--will re-xray in am and reasses-White count not suggestive of sepsis rpt in am Hypoglycemia This is resolved and sugars ranging 80-120's Parkinson's with dementia Continuing Sinemet IR 1 tab twice daily, continue Aricept 10 daily buspirone 15 daily and Remeron as well Bipolar See above Multiple unstageable full-thickness wounds on shoulders bilaterally in addition to right ear Wounds reviewed personally by me 1/31  No changes overtly Encounter for palliative care I had a discussion with daughter today regarding goals of care--she wishes Full code status Moderate to severe malnutrition BMI18  DVT heparin  Code Status: Full  Communication: called to daughter and updated  Disposition Plan:  Inpatient--not ready for discharge as fever    Mahala Menghini, MD  [facility] Via [employer] app OR -[email or web address] 7PM-7AM contact night coverage as above 03/08/2018, 1:57 PM  LOS: 4 days   Consultants:  n  Procedures:  n  Antimicrobials:  Unasyn started  Interval history/Subjective: Awakens but minimally responsive Has been eating some per RN remains listless to some degree-unclear if she has been OOB at all  Objective:  Vitals:  Vitals:   03/07/18 2131 03/08/18 0616  BP: 103/85 (!) 122/49  Pulse: 87 85  Resp: 16 15  Temp:  98.7 F (37.1 C)  SpO2:      Exam:   eomi ncat no distress no icterus no pallor cta b no  added sound no rales nor rhonchi abd soft scaphopid Wounds no reviewed today   I have personally reviewed the following:  DATA  Wbc 8.2 dwn from 8.6 Hemoglobin 14 Creat improved from 25/0.9-->22/0.76   Imaging studies:  CXR reviewed shows no significant large PNA maybe some L sided and R sdied atelecatasis   Scheduled Meds: . bacitracin   Topical BID  . busPIRone  15 mg Oral Daily  . carbidopa-levodopa  1 tablet Oral BID  . collagenase   Topical Daily  . donepezil  10 mg Oral QHS  . heparin  5,000 Units Subcutaneous Q8H  . mirtazapine  15 mg Oral QHS  . polyethylene glycol  17 g Oral Daily  . sodium chloride flush  3 mL Intravenous Q12H   Continuous Infusions: . sodium chloride 250 mL (03/07/18 1128)  . ampicillin-sulbactam (UNASYN) IV 1.5 g (03/08/18 1025)    Principal Problem:   Dysphagia Active Problems:   Acute encephalopathy   Malnutrition of moderate degree   Anxiety   Parkinsonism (HCC)   Dementia due to Parkinson's disease with behavioral disturbance (HCC)   Sacral ulcer, with unspecified severity (HCC)   Mediastinal mass   LOS: 4 days

## 2018-03-09 ENCOUNTER — Inpatient Hospital Stay (HOSPITAL_COMMUNITY): Payer: Medicare Other

## 2018-03-09 LAB — CBC WITH DIFFERENTIAL/PLATELET
Abs Immature Granulocytes: 0.02 10*3/uL (ref 0.00–0.07)
Basophils Absolute: 0 10*3/uL (ref 0.0–0.1)
Basophils Relative: 0 %
Eosinophils Absolute: 0.3 10*3/uL (ref 0.0–0.5)
Eosinophils Relative: 5 %
HCT: 33.8 % — ABNORMAL LOW (ref 36.0–46.0)
HEMOGLOBIN: 10.2 g/dL — AB (ref 12.0–15.0)
Immature Granulocytes: 0 %
Lymphocytes Relative: 27 %
Lymphs Abs: 1.8 10*3/uL (ref 0.7–4.0)
MCH: 29.5 pg (ref 26.0–34.0)
MCHC: 30.2 g/dL (ref 30.0–36.0)
MCV: 97.7 fL (ref 80.0–100.0)
Monocytes Absolute: 0.6 10*3/uL (ref 0.1–1.0)
Monocytes Relative: 9 %
Neutro Abs: 3.9 10*3/uL (ref 1.7–7.7)
Neutrophils Relative %: 59 %
Platelets: 231 10*3/uL (ref 150–400)
RBC: 3.46 MIL/uL — AB (ref 3.87–5.11)
RDW: 13.6 % (ref 11.5–15.5)
WBC: 6.6 10*3/uL (ref 4.0–10.5)
nRBC: 0 % (ref 0.0–0.2)

## 2018-03-09 LAB — BASIC METABOLIC PANEL
Anion gap: 4 — ABNORMAL LOW (ref 5–15)
BUN: 23 mg/dL (ref 8–23)
CO2: 28 mmol/L (ref 22–32)
Calcium: 7.9 mg/dL — ABNORMAL LOW (ref 8.9–10.3)
Chloride: 108 mmol/L (ref 98–111)
Creatinine, Ser: 0.7 mg/dL (ref 0.44–1.00)
GFR calc Af Amer: >60 mL/min (ref >60)
GFR calc non Af Amer: >60 mL/min (ref >60)
GLUCOSE: 91 mg/dL (ref 70–99)
Potassium: 4 mmol/L (ref 3.5–5.1)
Sodium: 140 mmol/L (ref 135–145)

## 2018-03-09 MED ORDER — AMOXICILLIN-POT CLAVULANATE 875-125 MG PO TABS
1.0000 | ORAL_TABLET | Freq: Two times a day (BID) | ORAL | Status: DC
  Administered 2018-03-09 – 2018-03-10 (×3): 1 via ORAL
  Filled 2018-03-09 (×3): qty 1

## 2018-03-09 NOTE — Progress Notes (Signed)
[facility] PROGRESS NOTE  Susan Simmons [id number] DOB: 12-Mar-1934 DOA: 03/02/2018 PCP: Clayborn Heron, MD   Narrative: 84 year old female with Parkinson's disease Parkinson's dementia bipolar admitted with hypothermia weakness and significant hypoglycemia on admission she was started on D5 She was discharged but then spiked a fever  101 with a blood pressure of 99/44 and blood cultures and x-ray were ordered   A & Plan ?  Aspiration leading to sepsis Aspiration event on 1/31 with hypotension No better-eating some but listless CXR 2 vw pending at this time transitioning to po Augmentin 03/09/18 to finish a 10 day course Expect high risk on-going for aspiration and frequent re-hospitlizations Hypoglycemia This is resolved and sugars ranging 91-105 Parkinson's with dementia Continuing Sinemet IR 1 tab twice daily, continue Aricept 10 daily buspirone 15 daily and Remeron as well Bipolar See above Multiple unstageable full-thickness wounds on shoulders bilaterally in addition to right ear Wounds reviewed personally by me 1/31  Recheck wounds in am Encounter for palliative care  Moderate to severe malnutrition BMI18  DVT heparin  Code Status: Full  Communication: called to daughter and updated-  Disposition Plan:  Inpatient--nearing d/c in 24 hours likely Will try for peer to peer call in am but have mentioned might not change likely outcome of d/c home with [facility], MD  [facility] Via [employer] app OR -[email or web address] 7PM-7AM contact night coverage as above 03/09/2018, 8:47 AM  LOS: 5 days   Consultants:  n  Procedures:  n  Antimicrobials:  Unasyn started  Interval history/Subjective:  More awake alert in nad No fever  Nursing reports was more awake alert and was eatign a tray  Has been more interactive No reports of dark stool  Objective:  Vitals:  Vitals:   03/08/18 2147 03/09/18 0617  BP: (!) 141/55 (!) 112/51  Pulse: 96 64  Resp: 18  20  Temp: 99 F (37.2 C) (!) 97.5 F (36.4 C)  SpO2: 98%     Exam:   eomi ncat no distress no icterus no pallor Wound on the R ear cta b no added sound no rales nor rhonchi abd soft scaphopid Wounds no reviewed today   I have personally reviewed the following:  DATA  Wbc 8.2 dwn from 8.6-->6.6 Hemoglobin 14-->10 Creat improved from 25/0.9-->22/0.76-->23/0.7   Imaging studies:  CXR reviewed shows no significant large PNA maybe some L sided and R sdied atelecatasis   Scheduled Meds: . bacitracin   Topical BID  . busPIRone  15 mg Oral Daily  . carbidopa-levodopa  1 tablet Oral BID  . collagenase   Topical Daily  . donepezil  10 mg Oral QHS  . heparin  5,000 Units Subcutaneous Q8H  . mirtazapine  15 mg Oral QHS  . polyethylene glycol  17 g Oral Daily  . sodium chloride flush  3 mL Intravenous Q12H   Continuous Infusions: . sodium chloride 10 mL/hr at 03/09/18 0200  . ampicillin-sulbactam (UNASYN) IV 1.5 g (03/09/18 0201)    Principal Problem:   Dysphagia Active Problems:   Acute encephalopathy   Malnutrition of moderate degree   Anxiety   Parkinsonism (HCC)   Dementia due to Parkinson's disease with behavioral disturbance (HCC)   Sacral ulcer, with unspecified severity (HCC)   Mediastinal mass   LOS: 5 days

## 2018-03-10 LAB — RENAL FUNCTION PANEL
ANION GAP: 5 (ref 5–15)
Albumin: 2.1 g/dL — ABNORMAL LOW (ref 3.5–5.0)
BUN: 22 mg/dL (ref 8–23)
CO2: 27 mmol/L (ref 22–32)
Calcium: 8.1 mg/dL — ABNORMAL LOW (ref 8.9–10.3)
Chloride: 107 mmol/L (ref 98–111)
Creatinine, Ser: 0.75 mg/dL (ref 0.44–1.00)
GFR calc Af Amer: >60 mL/min (ref >60)
GFR calc non Af Amer: >60 mL/min (ref >60)
Glucose, Bld: 92 mg/dL (ref 70–99)
Phosphorus: 2 mg/dL — ABNORMAL LOW (ref 2.5–4.6)
Potassium: 3.9 mmol/L (ref 3.5–5.1)
Sodium: 139 mmol/L (ref 135–145)

## 2018-03-10 LAB — CBC WITH DIFFERENTIAL/PLATELET
Abs Immature Granulocytes: 0.02 10*3/uL (ref 0.00–0.07)
Basophils Absolute: 0 10*3/uL (ref 0.0–0.1)
Basophils Relative: 0 %
EOS PCT: 5 %
Eosinophils Absolute: 0.3 10*3/uL (ref 0.0–0.5)
HCT: 34.1 % — ABNORMAL LOW (ref 36.0–46.0)
Hemoglobin: 10.3 g/dL — ABNORMAL LOW (ref 12.0–15.0)
Immature Granulocytes: 0 %
Lymphocytes Relative: 34 %
Lymphs Abs: 1.7 10*3/uL (ref 0.7–4.0)
MCH: 29.2 pg (ref 26.0–34.0)
MCHC: 30.2 g/dL (ref 30.0–36.0)
MCV: 96.6 fL (ref 80.0–100.0)
MONO ABS: 0.6 10*3/uL (ref 0.1–1.0)
Monocytes Relative: 12 %
Neutro Abs: 2.4 10*3/uL (ref 1.7–7.7)
Neutrophils Relative %: 49 %
Platelets: 231 10*3/uL (ref 150–400)
RBC: 3.53 MIL/uL — ABNORMAL LOW (ref 3.87–5.11)
RDW: 13.4 % (ref 11.5–15.5)
WBC: 4.9 10*3/uL (ref 4.0–10.5)
nRBC: 0 % (ref 0.0–0.2)

## 2018-03-10 MED ORDER — AMOXICILLIN-POT CLAVULANATE 875-125 MG PO TABS: 1.0000 | ORAL_TABLET | Freq: Two times a day (BID) | ORAL | 0 refills | Status: DC

## 2018-03-10 NOTE — Care Management Important Message (Signed)
Important Message  Patient Details  Name: Ayodele Spooner MRN: [id number] Date of Birth: 12-Aug-1934   Medicare Important Message Given:  Yes    Caren Macadam 03/10/2018, 11:03 AMImportant Message  Patient Details  Name: Coley Perdue MRN: [id number] Date of Birth: [birth year]   Medicare Important Message Given:  Yes    Caren Macadam 03/10/2018, 11:03 AM

## 2018-03-10 NOTE — Care Management (Signed)
This CM spoke with daughter for choice for home health services. Daughter states pt is active with [facility]. [facility] rep alerted that pt wants to continue services and plan is for dc today. Daughter states there is no DME needs and she would transport pt by car home. Sandford Craze RN,BSN [phone]

## 2018-03-10 NOTE — Progress Notes (Signed)
PT Cancellation Note  Patient Details Name: Susan Simmons MRN: [id number] DOB: Jul 13, 1934   Cancelled Treatment:    Reason Eval/Treat Not Completed: Other (comment) PT attempting to see patient today for mobility progression. Patient with eyes closed and only mumbling a few words with PT attempting to engage patient in conversation. Due to level of arousal PT deferring mobility for patient/therapist safety. Of note, PT speaking with nursing/chart review - patient to d/c home today with daughter and to receive [facility] services. PT to continue to follow in the event patient does not return home.   Kipp Laurence, PT, DPT Supplemental Physical Therapist 03/10/18 4:06 PM Pager: [phone] Office: [phone]

## 2018-03-10 NOTE — Discharge Summary (Signed)
Physician Discharge Summary  Susan Simmons ZOX:[id number] DOB: 1934-08-30 DOA: 03/02/2018  PCP: Clayborn Heron, MD  Admit date: 03/02/2018 Discharge date: 03/10/2018  Time spent: 25 minutes  Recommendations for Outpatient Follow-up:  1. Needs repeat CXR 2 vw in 1-2 Weeks 2. Needs oxygen 2 l at rest 3. Dress wounds with Santyl shoulders bilaterally and keep bottom dry 4. Needs CBC and basic metabolic panel 1 week 5. Expect very high risk of rehospitalization given chronic aspiration and multiple comorbidities   Discharge Diagnoses:  Principal Problem:   Dysphagia Active Problems:   Acute encephalopathy   Malnutrition of moderate degree   Anxiety   Parkinsonism (HCC)   Dementia due to Parkinson's disease with behavioral disturbance (HCC)   Sacral ulcer, with unspecified severity (HCC)   Mediastinal mass   Discharge Condition: Guarded  Diet recommendation: Regular  Filed Weights   03/02/18 1312  Weight: 48 kg    History of present illness:  84 year old female with Parkinson's disease and Parkinson's dementia bipolar-1/26 with hypothermia weakness and poor p.o. intake She has been having increasing rumination regurgitation for the past 24 to 48 hours at baseline is able to ambulate very slowly and was off of her Parkinson meds 1 week prior to admission  Hospital Course:  & Plan ?  Aspiration leading to sepsis Aspiration event on 1/31 with hypotension No better-eating some but listless CXR 2 vw shows L Lower lobe opacity Needs cxr in 1 mo transitioning to po Augmentin 03/09/18 to finish a 10 day course Expect high risk on-going for aspiration and frequent re-hospitalizations as remains full code acc to daughter Hypoglycemia This is resolved and sugars ranging 91-105 Parkinson's with dementia Continuing Sinemet IR 1 tab twice daily, continue Aricept 10 daily buspirone 15 daily and Remeron as well Bipolar See above Multiple unstageable full-thickness wounds on  shoulders bilaterally in addition to right ear Wounds reviewed personally by me 1/31  Dress with Santly daily and Home health requested to come out to home Encounter for palliative care  Moderate to severe malnutrition BMI18   Discharge Exam: Vitals:   03/09/18 2213 03/10/18 0634  BP: 138/65 124/62  Pulse: 77 69  Resp: 20 18  Temp: 99.1 F (37.3 C) 98.2 F (36.8 C)  SpO2: 99% 100%    General: Awake alert pleasan noncoherent Cardiovascular: S1-S2 no murmur rub or gallop Respiratory: Clinically clear no added sound no rales no rhonchi Abdomen soft nontender no rebound no guarding Bilateral shoulders have wounds which were examined last on 2/1 Chest is clear No lower extremity edema  Discharge Instructions   Discharge Instructions    Diet - low sodium heart healthy   Complete by:  As directed    Diet - low sodium heart healthy   Complete by:  As directed    Discharge instructions   Complete by:  As directed    copmplete course of antibiotics Use oxygen at all times Dressings with Santyl daily to areas of arms and hips etc We will arrange maximal home health at home in addition to a Social worker to help at home   Increase activity slowly   Complete by:  As directed    Increase activity slowly   Complete by:  As directed      Allergies as of 03/10/2018   No Known Allergies     Medication List    TAKE these medications   amoxicillin-clavulanate 875-125 MG tablet Commonly known as:  AUGMENTIN Take 1 tablet by mouth every 12 (twelve)  hours.   busPIRone 15 MG tablet Commonly known as:  BUSPAR Take 1 tablet (15 mg total) by mouth daily.   carbidopa-levodopa 25-100 MG tablet Commonly known as:  SINEMET IR Take 1 tablet by mouth 2 (two) times daily.   collagenase ointment Commonly known as:  SANTYL Apply topically daily.   donepezil 10 MG tablet Commonly known as:  ARICEPT Take 1 tablet (10 mg total) by mouth at bedtime.   ENSURE Take 237 mLs by mouth 3  (three) times daily between meals. One can three times daily to promote wound healing and support weight status   mirtazapine 15 MG tablet Commonly known as:  REMERON Take 1 tablet (15 mg total) by mouth at bedtime.   vitamin C 500 MG tablet Commonly known as:  ASCORBIC ACID Take 500 mg by mouth daily. Take one tablet twice daily            Durable Medical Equipment  (From admission, onward)         Start     Ordered   03/10/18 1202  DME Oxygen  Once    Question Answer Comment  Mode or (Route) Nasal cannula   Liters per Minute 2   Frequency Continuous (stationary and portable oxygen unit needed)   Oxygen conserving device No   Oxygen delivery system Gas      03/10/18 1202         No Known Allergies Contact information for after-discharge care    Destination    [facility] .   Service:  Skilled Nursing Contact information: [address] [place] [address] [phone]               The results of significant diagnostics from this hospitalization (including imaging, microbiology, ancillary and laboratory) are listed below for reference.    Significant Diagnostic Studies: Dg Chest 2 View  Result Date: 03/09/2018 CLINICAL DATA:  Dysphagia, decreased PO intake, weakness, hypothermia EXAM: CHEST - 2 VIEW COMPARISON:  03/07/2018 FINDINGS: Retrocardiac/left lower lobe opacity, atelectasis versus pneumonia. Small left pleural effusion. Right lung is clear. No pneumothorax. The heart is normal in size. IMPRESSION: Left lower lobe opacity, atelectasis versus pneumonia. Small left pleural effusion. Electronically Signed   By: Charline Bills M.D.   On: 03/09/2018 11:02   Dg Chest 2 View  Result Date: 03/07/2018 CLINICAL DATA:  Unresponsive, history pneumonia, Parkinson's EXAM: CHEST - 2 VIEW COMPARISON:  03/06/2018 FINDINGS: Upper normal size of cardiac silhouette. Atherosclerotic calcification aorta. Atelectasis versus consolidation of  LEFT lower lobe progressive since prior study. Remaining lungs clear. Layering dependent LEFT pleural effusion. No pneumothorax. Bones demineralized. IMPRESSION: Increased atelectasis versus consolidation in LEFT lower lobe with associated dependent LEFT pleural effusion. Electronically Signed   By: Ulyses Southward M.D.   On: 03/07/2018 10:44   Dg Chest 2 View  Result Date: 03/02/2018 CLINICAL DATA:  The patient is unable to swallow this morning. EXAM: CHEST - 2 VIEW COMPARISON:  11/18/2015 FINDINGS: The trachea is deviated to the right. There is an abnormal convex contour of the left mediastinal border. Mild enlargement of the cardiac silhouette. There is no evidence of focal airspace consolidation, pleural effusion or pneumothorax. Osseous structures are without acute abnormality. Soft tissues are grossly normal. IMPRESSION: Abnormal convex contour of the left mediastinal border, suspicious for a mediastinal soft tissue mass or a vascular abnormality. If further imaging evaluation is desired, CT of the chest with contrast should be considered. Electronically Signed   By: Sharlyne Pacas  Dimitrova M.D.   On: 03/02/2018 15:22   Ct Head Wo Contrast  Result Date: 03/02/2018 CLINICAL DATA:  Unresponsive. EXAM: CT HEAD WITHOUT CONTRAST TECHNIQUE: Contiguous axial images were obtained from the base of the skull through the vertex without intravenous contrast. COMPARISON:  10/14/2015 FINDINGS: Brain: There is some progression of small vessel disease since the prior study. There is no evidence of hemorrhage, acute infarction, edema, mass effect, extra-axial fluid collection, hydrocephalus or mass lesion. Vascular: No hyperdense vessel or unexpected calcification. Skull: Normal. Negative for fracture or focal lesion. Sinuses/Orbits: No acute finding. Other: None. IMPRESSION: No acute findings. Some progression periventricular small vessel disease is noted since the prior study. Electronically Signed   By: Irish LackGlenn  Yamagata M.D.    On: 03/02/2018 15:07   Ct Chest W Contrast  Result Date: 03/02/2018 CLINICAL DATA:  Abnormal chest x-ray EXAM: CT CHEST WITH CONTRAST TECHNIQUE: Multidetector CT imaging of the chest was performed during intravenous contrast administration. CONTRAST:  75mL OMNIPAQUE IOHEXOL 300 MG/ML  SOLN COMPARISON:  Film from earlier in the same day. FINDINGS: Cardiovascular: Thoracic aorta demonstrates atherosclerotic calcifications without aneurysmal dilatation or dissection. No cardiac enlargement is seen. Mild coronary calcifications are noted. The pulmonary artery as visualized is within normal limits. Mediastinum/Nodes: Thoracic inlet is within normal limits with the exception of an enlarged thyroid with multiple small nodules within. The overall appearance is stable from prior CT examination from 2017. no hilar or mediastinal adenopathy is noted. The esophagus is within normal limits. Lungs/Pleura: Lungs are well aerated bilaterally without focal infiltrate or sizable effusion. No focal mass lesion is noted. The abnormality seen on the prior chest x-ray is related to patient rotation and a combination of vascular, bony and parenchymal shadows. No mass lesion is seen. Upper Abdomen: Changes of prior cholecystectomy are noted with dilatation of the biliary tree. Musculoskeletal: Degenerative changes of the cervicothoracic spine are seen. No acute bony abnormality is noted. No compression deformities are seen. IMPRESSION: Abnormality seen on recent chest x-ray does not represent a mass but a combination of vascular parenchymal and bony shadows accentuated by patient rotation to the right. Stable goiter dating back to 2017. No acute abnormality noted. Aortic Atherosclerosis (ICD10-I70.0). Electronically Signed   By: Alcide CleverMark  Lukens M.D.   On: 03/02/2018 16:37   Dg Chest Port 1 View  Result Date: 03/06/2018 CLINICAL DATA:  Fever EXAM: PORTABLE CHEST 1 VIEW COMPARISON:  CT and CXR 03/02/2018 FINDINGS: Stable cardiomegaly  with aortic atherosclerosis. Left basilar atelectasis is noted. Opacities projecting over the left upper lobe are consistent with costochondral calcifications based on recent CT. No effusion or pneumothorax. Osteoarthritis of the included glenohumeral and right AC joints. Cholecystectomy clips are seen in the right upper quadrant of the abdomen. IMPRESSION: 1. No active pulmonary disease. 2. Left base atelectasis. Stable cardiomegaly with aortic atherosclerosis. Electronically Signed   By: Tollie Ethavid  Kwon M.D.   On: 03/06/2018 22:59    Microbiology: Recent Results (from the past 240 hour(s))  Blood Cultures (routine x 2)     Status: None   Collection Time: 03/02/18  2:14 PM  Result Value Ref Range Status   Specimen Description   Final    BLOOD RIGHT ANTECUBITAL Performed at [facility], [address] [place], [place] [address]    Special Requests   Final    BOTTLES DRAWN AEROBIC AND ANAEROBIC Blood Culture adequate volume Performed at [facility], [address] [place], [place] [address]    Culture  Final    NO GROWTH 5 DAYS Performed at [facility], [address], [place], [place] [address]    Report Status 03/07/2018 FINAL  Final  Blood Cultures (routine x 2)     Status: None   Collection Time: 03/02/18  2:14 PM  Result Value Ref Range Status   Specimen Description   Final    BLOOD LEFT ANTECUBITAL Performed at [facility], [address], [place], [place] [address]    Special Requests   Final    BOTTLES DRAWN AEROBIC AND ANAEROBIC Blood Culture adequate volume Performed at [facility], [address] [place], [place] [address]    Culture   Final    NO GROWTH 5 DAYS Performed at [facility], [address] [place], [place] [address]    Report Status 03/07/2018 FINAL  Final  Culture, blood (routine x 2)     Status: None (Preliminary result)   Collection Time: 03/06/18 10:37 PM   Result Value Ref Range Status   Specimen Description   Final    BLOOD RIGHT WRIST Performed at [facility], [address] [place], [place] [address]    Special Requests   Final    BOTTLES DRAWN AEROBIC AND ANAEROBIC Blood Culture adequate volume Performed at [facility], [address] [place], [place] [address]    Culture   Final    NO GROWTH 3 DAYS Performed at [facility], [address], [place], [place] [address]    Report Status PENDING  Incomplete  Culture, blood (routine x 2)     Status: None (Preliminary result)   Collection Time: 03/06/18 10:37 PM  Result Value Ref Range Status   Specimen Description   Final    BLOOD RIGHT HAND Performed at [facility], [address], [place], [place] [address]    Special Requests   Final    BOTTLES DRAWN AEROBIC AND ANAEROBIC Blood Culture adequate volume Performed at [facility], [address], [place], [place] [address]    Culture   Final    NO GROWTH 3 DAYS Performed at [facility], [address] [place], [place] [address]    Report Status PENDING  Incomplete     Labs: Basic Metabolic Panel: Recent Labs  Lab 03/04/18 0410 03/05/18 0513 03/07/18 0927 03/08/18 0421 03/09/18 0359 03/10/18 0405  NA 139 139 139 142 140 139  K 3.6 3.7 4.0 4.6 4.0 3.9  CL 108 109 105 104 108 107  CO2 26 26 28 26 28 27   GLUCOSE 138* 85 110* 95 91 92  BUN 12 14 25* 22 23 22   CREATININE 0.69 0.73 0.96 0.79 0.70 0.75  CALCIUM 8.1* 8.3* 8.3* 9.0 7.9* 8.1*  MG 2.1  --   --   --   --   --   PHOS  --   --   --  3.0  --  2.0*   Liver Function Tests: Recent Labs  Lab 03/07/18 0927 03/08/18 0421 03/10/18 0405  AST 20  --   --   ALT 6  --   --   ALKPHOS 46  --   --   BILITOT 0.3  --   --   PROT 5.5*  --   --   ALBUMIN 2.4* 3.0* 2.1*   No results for input(s): LIPASE, AMYLASE in the last 168 hours. No results for input(s): AMMONIA in  the  last 168 hours. CBC: Recent Labs  Lab 03/05/18 0513 03/07/18 0927 03/08/18 0421 03/09/18 0359 03/10/18 0405  WBC 4.7 8.6 8.2 6.6 4.9  NEUTROABS  --  6.3 6.7 3.9 2.4  HGB 11.3* 11.0* 14.3 10.2* 10.3*  HCT 36.7 36.9 46.5* 33.8* 34.1*  MCV 95.3 94.9 93.8 97.7 96.6  PLT 225 240 223 231 231   Cardiac Enzymes: No results for input(s): CKTOTAL, CKMB, CKMBINDEX, TROPONINI in the last 168 hours. BNP: BNP (last 3 results) No results for input(s): BNP in the last 8760 hours.  ProBNP (last 3 results) No results for input(s): PROBNP in the last 8760 hours.  CBG: No results for input(s): GLUCAP in the last 168 hours.     Signed:  Rhetta Mura MD   [facility] 03/10/2018, 12:03 PM

## 2018-03-11 ENCOUNTER — Telehealth: Payer: Self-pay | Admitting: Neurology

## 2018-03-11 NOTE — Telephone Encounter (Signed)
I tried to call pt's daughter Rinaldo Cloud however vm box was full. Will try again later. If she calls back, please give her Dr. Trevor Mace message.

## 2018-03-11 NOTE — Telephone Encounter (Signed)
Pt daughter(on DPR) has called to inform that pt was just released from hospital after 8 days for having difficulty swallowing.  Daughter is asking if there is anything Dr Lucia Gaskins would suggest or recommend.  Daughter was asked if she wanted to schedule an appointment and she declined stating there is not need for Dr Lucia Gaskins to just look at pt.  Please call

## 2018-03-11 NOTE — Telephone Encounter (Signed)
I read the discharge summary and I agree with the physicians in the hospital. I have nothing more to add. I am so sorry she is declining. Dr a

## 2018-03-12 LAB — CULTURE, BLOOD (ROUTINE X 2)
Culture: NO GROWTH
Culture: NO GROWTH
Special Requests: ADEQUATE
Special Requests: ADEQUATE

## 2018-03-12 NOTE — Telephone Encounter (Signed)
I spoke with the pt's daughter and gave her Dr. Trevor MaceAhern's message. She verbalized understanding. She stated that pt is actually doing better since discharge. She said she is walking on her "tippy toes" and is halfway feeding herself again. She has a doctor's appt today and is going to walk there. She did express that she was not happy that pt wasn't put in the chair more often while in the hospital. She said she is going to get a second opinion on pt but did not express which type of doctor she was referring to.

## 2018-03-17 ENCOUNTER — Emergency Department (HOSPITAL_COMMUNITY): Payer: Medicare Other

## 2018-03-17 ENCOUNTER — Encounter (HOSPITAL_COMMUNITY): Payer: Self-pay

## 2018-03-17 ENCOUNTER — Emergency Department (HOSPITAL_COMMUNITY)
Admission: EM | Admit: 2018-03-17 | Discharge: 2018-03-17 | Disposition: A | Discharge status: Home / Self Care | Payer: Medicare Other | Attending: Emergency Medicine | Admitting: Emergency Medicine

## 2018-03-17 DIAGNOSIS — Z79899 Other long term (current) drug therapy: Secondary | ICD-10-CM | POA: Diagnosis not present

## 2018-03-17 DIAGNOSIS — J690 Pneumonitis due to inhalation of food and vomit: Secondary | ICD-10-CM | POA: Diagnosis not present

## 2018-03-17 DIAGNOSIS — F0390 Unspecified dementia without behavioral disturbance: Secondary | ICD-10-CM | POA: Diagnosis not present

## 2018-03-17 DIAGNOSIS — Z87891 Personal history of nicotine dependence: Secondary | ICD-10-CM | POA: Insufficient documentation

## 2018-03-17 DIAGNOSIS — G20 Parkinson's disease: Secondary | ICD-10-CM | POA: Insufficient documentation

## 2018-03-17 DIAGNOSIS — R05 Cough: Secondary | ICD-10-CM | POA: Diagnosis present

## 2018-03-17 LAB — CBC WITH DIFFERENTIAL/PLATELET
ABS IMMATURE GRANULOCYTES: 0.04 10*3/uL (ref 0.00–0.07)
Basophils Absolute: 0 10*3/uL (ref 0.0–0.1)
Basophils Relative: 0 %
Eosinophils Absolute: 0.1 10*3/uL (ref 0.0–0.5)
Eosinophils Relative: 1 %
HCT: 37.7 % (ref 36.0–46.0)
Hemoglobin: 11.4 g/dL — ABNORMAL LOW (ref 12.0–15.0)
Immature Granulocytes: 0 %
Lymphocytes Relative: 10 %
Lymphs Abs: 1 10*3/uL (ref 0.7–4.0)
MCH: 28.8 pg (ref 26.0–34.0)
MCHC: 30.2 g/dL (ref 30.0–36.0)
MCV: 95.2 fL (ref 80.0–100.0)
Monocytes Absolute: 0.7 10*3/uL (ref 0.1–1.0)
Monocytes Relative: 7 %
NEUTROS ABS: 8.6 10*3/uL — AB (ref 1.7–7.7)
NEUTROS PCT: 82 %
Platelets: 395 10*3/uL (ref 150–400)
RBC: 3.96 MIL/uL (ref 3.87–5.11)
RDW: 13.7 % (ref 11.5–15.5)
WBC: 10.5 10*3/uL (ref 4.0–10.5)
nRBC: 0 % (ref 0.0–0.2)

## 2018-03-17 LAB — COMPREHENSIVE METABOLIC PANEL
ALT: 9 U/L (ref 0–44)
ANION GAP: 7 (ref 5–15)
AST: 24 U/L (ref 15–41)
Albumin: 2.8 g/dL — ABNORMAL LOW (ref 3.5–5.0)
Alkaline Phosphatase: 47 U/L (ref 38–126)
BUN: 22 mg/dL (ref 8–23)
CO2: 28 mmol/L (ref 22–32)
Calcium: 8.5 mg/dL — ABNORMAL LOW (ref 8.9–10.3)
Chloride: 104 mmol/L (ref 98–111)
Creatinine, Ser: 0.93 mg/dL (ref 0.44–1.00)
GFR calc non Af Amer: 58 mL/min — ABNORMAL LOW (ref >60)
Glucose, Bld: 99 mg/dL (ref 70–99)
Potassium: 3.8 mmol/L (ref 3.5–5.1)
Sodium: 139 mmol/L (ref 135–145)
Total Bilirubin: 0.4 mg/dL (ref 0.3–1.2)
Total Protein: 6.8 g/dL (ref 6.5–8.1)

## 2018-03-17 LAB — LACTIC ACID, PLASMA: LACTIC ACID, VENOUS: 1.1 mmol/L (ref 0.5–1.9)

## 2018-03-17 MED ORDER — OXYMETAZOLINE HCL 0.05 % NA SOLN: 1.0000 | Freq: Two times a day (BID) | NASAL | 0 refills | Status: DC | PRN

## 2018-03-17 MED ORDER — SODIUM CHLORIDE 0.9 % IV BOLUS
1000.0000 mL | Freq: Once | INTRAVENOUS | Status: AC
  Administered 2018-03-17: 1000 mL via INTRAVENOUS

## 2018-03-17 MED ORDER — SODIUM CHLORIDE 0.9 % IV SOLN
2.0000 g | Freq: Once | INTRAVENOUS | Status: AC
  Administered 2018-03-17: 2 g via INTRAVENOUS
  Filled 2018-03-17: qty 2

## 2018-03-17 MED ORDER — VANCOMYCIN HCL IN DEXTROSE 1-5 GM/200ML-% IV SOLN
1000.0000 mg | Freq: Once | INTRAVENOUS | Status: AC
  Administered 2018-03-17: 1000 mg via INTRAVENOUS
  Filled 2018-03-17: qty 200

## 2018-03-17 MED ORDER — LEVOFLOXACIN 750 MG PO TABS: 750.0000 mg | ORAL_TABLET | Freq: Every day | ORAL | 0 refills | Status: DC

## 2018-03-17 NOTE — Discharge Instructions (Signed)
Take levaquin daily for a week   Use afrin nasal spray twice daily as needed for congestion. Stop after 3 days   See your doctor this week. Talk to you doctor about how to prevent aspiration   Return to ER if you have worse cough, congestion, fever, cough.

## 2018-03-17 NOTE — ED Notes (Signed)
Patient transported to X-ray 

## 2018-03-17 NOTE — Progress Notes (Signed)
A consult was received from an ED physician for Vancomycin & Cefepime per pharmacy dosing.  The patient's profile has been reviewed for ht/wt/allergies/indication/available labs.    A one time order has been placed for Vancomycin 1gm and Cefepime 2gm.  Further antibiotics/pharmacy consults should be ordered by admitting physician if indicated.                       Thank you,  Otho Bellows 03/17/2018  2:40 PM

## 2018-03-17 NOTE — ED Provider Notes (Signed)
[facility]-EMERGENCY DEPT Provider Note   CSN: [id number] Arrival date & time: 03/17/18  1120     History   Chief Complaint Chief Complaint  Patient presents with  . Cough  . URI    HPI Susan Simmons is a 84 y.o. female history of Parkinson's disease, depression, anxiety, aspiration pneumonia here presenting with increasing congestion and hypoxia.  Patient was recently admitted for aspiration pneumonia and just finished a course of Augmentin about a week ago.  Over the last 3 to 4 days, she has been having worsening nasal congestion and postnasal drip.  Patient also has some productive cough with brownish sputum.  She has home health that comes to check on her and was here this morning and noticed that she was hypoxic around 84 to 86%.  Patient is not on oxygen at home. No fever at home.   The history is provided by the patient and a relative.    Past Medical History:  Diagnosis Date  . Anxiety   . Depression   . Heart murmur   . Parkinson disease (HCC)   . Tooth infection 2019    Patient Active Problem List   Diagnosis Date Noted  . Dysphagia 03/02/2018  . Mediastinal mass 03/02/2018  . Fall 09/25/2016  . Sacral ulcer, with unspecified severity (HCC) 09/25/2016  . Suspected deep tissue injury of unknown depth of heel 09/25/2016  . Dementia due to Parkinson's disease with behavioral disturbance (HCC) 12/19/2015  . Psychosis (HCC) 12/19/2015  . AKI (acute kidney injury) (HCC) 11/22/2015  . Dehydration 11/22/2015  . Parkinsonism (HCC) 01/25/2015  . Abnormality of gait   . Leukopenia 01/07/2015  . Anxiety 11/12/2014  . Malnutrition of moderate degree 11/10/2014  . Pressure ulcer stage II 11/09/2014  . Acute encephalopathy 08/02/2014  . Depression 05/02/2014    Past Surgical History:  Procedure Laterality Date  . denies surgical history       OB History   No obstetric history on file.      Home Medications    Prior to Admission  medications   Medication Sig Start Date End Date Taking? Authorizing Provider  amoxicillin-clavulanate (AUGMENTIN) 875-125 MG tablet Take 1 tablet by mouth every 12 (twelve) hours. 03/10/18   Rhetta Mura, MD  busPIRone (BUSPAR) 15 MG tablet Take 1 tablet (15 mg total) by mouth daily. 03/05/18   Rhetta Mura, MD  carbidopa-levodopa (SINEMET IR) 25-100 MG tablet Take 1 tablet by mouth 2 (two) times daily. 03/05/18   Rhetta Mura, MD  collagenase (SANTYL) ointment Apply topically daily. 03/05/18   Rhetta Mura, MD  donepezil (ARICEPT) 10 MG tablet Take 1 tablet (10 mg total) by mouth at bedtime. 03/05/18   Rhetta Mura, MD  ENSURE (ENSURE) Take 237 mLs by mouth 3 (three) times daily between meals. One can three times daily to promote wound healing and support weight status    [provider]  mirtazapine (REMERON) 15 MG tablet Take 1 tablet (15 mg total) by mouth at bedtime. 03/05/18   Rhetta Mura, MD  vitamin C (ASCORBIC ACID) 500 MG tablet Take 500 mg by mouth daily. Take one tablet twice daily     [provider]    Family History Family History  Problem Relation Age of Onset  . Dementia Neg Hx   . Parkinsonism Neg Hx     Social History Social History   Tobacco Use  . Smoking status: Former Smoker    Last attempt to quit: 03/04/1996  Years since quitting: 22.0  . Smokeless tobacco: Never Used  Substance Use Topics  . Alcohol use: No  . Drug use: No     Allergies   Patient has no known allergies.   Review of Systems Review of Systems  Respiratory: Positive for cough and shortness of breath.   All other systems reviewed and are negative.    Physical Exam Updated Vital Signs BP (!) 126/59 (BP Location: Right Arm)   Pulse 79   Temp 98.4 F (36.9 C) (Rectal)   Resp 14   SpO2 93%   Physical Exam Vitals signs and nursing note reviewed.  HENT:     Head: Normocephalic.     Right Ear: Tympanic membrane  normal.     Left Ear: Tympanic membrane normal.     Nose: Nose normal.     Mouth/Throat:     Mouth: Mucous membranes are moist.  Eyes:     Pupils: Pupils are equal, round, and reactive to light.  Neck:     Musculoskeletal: Normal range of motion.  Cardiovascular:     Rate and Rhythm: Normal rate.     Pulses: Normal pulses.     Heart sounds: Normal heart sounds.  Pulmonary:     Effort: Pulmonary effort is normal.     Comments: Diminished bilateral bases  Abdominal:     General: Abdomen is flat.     Palpations: Abdomen is soft.  Musculoskeletal: Normal range of motion.     Comments: Stage 4 R ear ulcer, stage 2 R elbow ulcer   Skin:    General: Skin is warm.     Capillary Refill: Capillary refill takes less than 2 seconds.  Neurological:     General: No focal deficit present.     Mental Status: She is alert.  Psychiatric:        Mood and Affect: Mood normal.      ED Treatments / Results  Labs (all labs ordered are listed, but only abnormal results are displayed) Labs Reviewed  CULTURE, BLOOD (ROUTINE X 2)  CULTURE, BLOOD (ROUTINE X 2)  URINE CULTURE  LACTIC ACID, PLASMA  LACTIC ACID, PLASMA  COMPREHENSIVE METABOLIC PANEL  CBC WITH DIFFERENTIAL/PLATELET  URINALYSIS, ROUTINE W REFLEX MICROSCOPIC    EKG None  Radiology No results found.  Procedures Procedures (including critical care time)  Medications Ordered in ED Medications  vancomycin (VANCOCIN) IVPB 1000 mg/200 mL premix (has no administration in time range)  ceFEPIme (MAXIPIME) 2 g in sodium chloride 0.9 % 100 mL IVPB (has no administration in time range)  sodium chloride 0.9 % bolus 1,000 mL (has no administration in time range)     Initial Impression / Assessment and Plan / ED Course  I have reviewed the triage vital signs and the nursing notes.  Pertinent labs & imaging results that were available during my care of the patient were reviewed by me and considered in my medical decision making (see  chart for details).    Susan Simmons is a 84 y.o. female history of dysphagia here presenting with possible aspiration pneumonia.  Patient is afebrile in the ED.  Possible hypoxia per home health but her oxygen level is normal here.  Patient appears chronically ill. Will do sepsis work up with labs, CXR, lactate.   2:57 PM WBC nl, lactate nl. CXR clear. Given vanc/cefepime. She likely has recurrent aspiration events. However, she is not septic currently. Will dc home with levaquin. Will have her follow up with PCP for  further workup for dysphagia. Can try afrin as needed for congestion.    Final Clinical Impressions(s) / ED Diagnoses   Final diagnoses:  None    ED Discharge Orders    None       Charlynne PanderYao, Driana Dazey Hsienta, MD 03/17/18 [phone]

## 2018-03-17 NOTE — ED Triage Notes (Signed)
Patient has had a productive (white, thick) x2 days. Patient has choked a couple times while coughing and daughter thinks she may have aspirated.   Home care came in and checked her 36. Stayed around 84-86% and the highest was 91%   Oriented X1.   Ambulatory with assistance only.   Not able to get get a good 02 reading in triage.

## 2018-03-17 NOTE — ED Notes (Signed)
Bed: WA21 Expected date:  Expected time:  Means of arrival:  Comments: TRIAGE 3

## 2018-03-17 NOTE — Discharge Planning (Signed)
Pt currently active with [facility] for RN services.  Resumption of care requested will addition of PT and OT. Angela Adam of [facility] notified.  DME needs identified at this time include hospital bed.  EDCM explained that a space for the bed would need to be cleared prior to delivery of bed.  EDP will place order and [facility] will have it delivered when space is cleared.

## 2018-03-22 LAB — CULTURE, BLOOD (ROUTINE X 2)
CULTURE: NO GROWTH
Culture: NO GROWTH
Special Requests: ADEQUATE
Special Requests: ADEQUATE

## 2018-03-24 ENCOUNTER — Encounter: Payer: Self-pay | Admitting: Neurology

## 2018-04-03 NOTE — Progress Notes (Signed)
Susan Simmons was seen today in the movement disorders clinic for neurologic consultation at the request of Rankins, Fanny Dance, MD.  The consultation is for the evaluation of PD.  The records that were made available to me were reviewed.  Pt has previously been under the care of Dr. Lucia Gaskins.  She last saw her in 09/2017.  Daughter supplements hx today.  Pt unable to provide any history.  Daughter states that she is not awake in the AM (and was seen in the AM).  States that she is given greek yogurt protein in the AM and she seems in pain after that and she will go back to sleep in the AM.  Patient first presented to Dr. Lucia Gaskins in March, 2016 for evaluation of Parkinson's disease.  At that point in time, physical exam was noted for decreased arm swing, shuffling gait, decreased blink, but it was felt that she did not have Parkinson's disease at the time, but rather worsening depression from the death of her husband 5 years earlier (apparently laid in the bed for 5 years).  She was seen in June, 2016 in the hospital and started on levodopa by the neuro hospitalist.  It does not appear that when she followed up in September, 2016, she was on the medication.  However, some hallucinations were reported at that period of time.  Dr. Lucia Gaskins did start her on carbidopa/levodopa 25/100 in September, 2016.  She went to the hospital 1 month later and saw Dr. Thad Ranger for mental status change.  Dr. Thad Ranger wrote that the patient had been taking "long-acting Sinemet" and it was unclear if she was tolerating it well.  She saw Dr. Hosie Poisson.  He recommended NG tube in the hospital for administration of the levodopa and taking it 3 times a day (was only taking it twice per day).  She followed back up with Dr. Lucia Gaskins and was only taking it twice per day.  She was given amantadine, but did not take that.  She ended back up in the hospital in December, 2016 with increasing confusion.  No changes were made to her medication regimen.   She continued to take levodopa on a twice daily basis.  She was last seen in August, 2019, at which point Aricept was started.  She was admitted to the hospital on March 02, 2018 with dysphagia.  Bedside evaluation was done on March 03, 2018 and recommended mechanical soft diet with thin liquids  Currently giving carbidopa/levodopa 25/100 at 8am and 2:30pm.  Daughter admits that she just went to 2 tablets in the last month.  Drooling has come overwhelming.     Specific Symptoms:  Tremor: Yes.  , R hand Family hx of similar:  No. Voice: she talks per daughter and can be quiet when speaks Sleep: sleeps in the AM  Vivid Dreams:  No.  Acting out dreams:  No.  But did have that in the past  Wet Pillows: Yes.   Postural symptoms:  Yes.    Falls?  Yes.   Bradykinesia symptoms: shuffling gait, slow movements, difficulty getting out of a chair and difficulty regaining balance Urinary Incontinence:  Yes.   Difficulty Swallowing:  Yes.  , will cough after foods Handwriting, micrographia: unknown Trouble with ADL's:  Yes.    (100% dependent for ADL's)  Trouble buttoning clothing: Yes.   Memory changes:  Yes.   (knows family members per daughter) Hallucinations:  Daughter states that she doesn't know but doesn't think so N/V:  No. Lightheaded:  unknown  Syncope: No. Diplopia:  Unknown  Daughter questions whether or not there may have been a baseline psychiatric issue which contributed to hallucinations.  She does know that her mother was in a "mental hospital" in the past and thinks that there may have been hallucinations in the past.  Daughter is unaware of prior antipsychotic exposure.   ALLERGIES:  No Known Allergies  CURRENT MEDICATIONS:  Outpatient Encounter Medications as of 04/08/2018  Medication Sig  . busPIRone (BUSPAR) 15 MG tablet Take 1 tablet (15 mg total) by mouth daily.  . carbidopa-levodopa (SINEMET IR) 25-100 MG tablet Take 1 tablet by mouth 2 (two) times daily.  .  collagenase (SANTYL) ointment Apply topically daily.  Marland Kitchen donepezil (ARICEPT) 10 MG tablet Take 1 tablet (10 mg total) by mouth at bedtime.  . ENSURE (ENSURE) Take 237 mLs by mouth 3 (three) times daily between meals. One can three times daily to promote wound healing and support weight status  . loratadine (CLARITIN) 10 MG tablet Take 10 mg by mouth daily.  . mirtazapine (REMERON) 15 MG tablet Take 1 tablet (15 mg total) by mouth at bedtime.  . vitamin C (ASCORBIC ACID) 500 MG tablet Take 500 mg by mouth daily. Take one tablet twice daily   . [DISCONTINUED] amoxicillin-clavulanate (AUGMENTIN) 875-125 MG tablet Take 1 tablet by mouth every 12 (twelve) hours.  . [DISCONTINUED] levofloxacin (LEVAQUIN) 750 MG tablet Take 1 tablet (750 mg total) by mouth daily. X 7 days  . [DISCONTINUED] oxymetazoline (AFRIN NASAL SPRAY) 0.05 % nasal spray Place 1 spray into both nostrils 2 (two) times daily as needed for congestion. Stop after 3 days   No facility-administered encounter medications on file as of 04/08/2018.     PAST MEDICAL HISTORY:   Past Medical History:  Diagnosis Date  . Anxiety   . Depression   . Heart murmur   . Parkinson disease (HCC)   . Tooth infection 2019    PAST SURGICAL HISTORY:   Past Surgical History:  Procedure Laterality Date  . denies surgical history      SOCIAL HISTORY:   Social History   Socioeconomic History  . Marital status: Divorced    Spouse name: Not on file  . Number of children: 4  . Years of education: 14  . Highest education level: Not on file  Occupational History  . Not on file  Social Needs  . Financial resource strain: Not on file  . Food insecurity:    Worry: Not on file    Inability: Not on file  . Transportation needs:    Medical: Not on file    Non-medical: Not on file  Tobacco Use  . Smoking status: Former Smoker    Last attempt to quit: 03/04/1996    Years since quitting: 22.1  . Smokeless tobacco: Never Used  Substance and Sexual  Activity  . Alcohol use: No  . Drug use: No  . Sexual activity: Never  Lifestyle  . Physical activity:    Days per week: Not on file    Minutes per session: Not on file  . Stress: Not on file  Relationships  . Social connections:    Talks on phone: Not on file    Gets together: Not on file    Attends religious service: Not on file    Active member of club or organization: Not on file    Attends meetings of clubs or organizations: Not on file    Relationship  status: Not on file  . Intimate partner violence:    Fear of current or ex partner: Not on file    Emotionally abused: Not on file    Physically abused: Not on file    Forced sexual activity: Not on file  Other Topics Concern  . Not on file  Social History Narrative   Lives at home with daughter   Caffeine use: none    FAMILY HISTORY:   Family Status  Relation Name Status  . Mother  Deceased  . Father  Deceased  . Daughter [employer]  . Sister x2 Alive  . Sister x1 Deceased  . Daughter  Alive  . Neg Hx  (Not Specified)    ROS:  Review of Systems  Unable to perform ROS: Dementia    PHYSICAL EXAMINATION:    VITALS:   Vitals:   04/08/18 0937  BP: 116/66  Pulse: 84    GEN:  The patient appears stated age and is in NAD.  She is leaned to the left in the chair.   HEENT:  Normocephalic, atraumatic.  The mucous membranes are moist. The superficial temporal arteries are without ropiness or tenderness.  She has copious drooling.   CV:  RRR Lungs:  CTAB Neck/HEME:  There are no carotid bruits bilaterally.  Neurological examination:  Orientation: MOCA is attempted.  Pt cannot participate.   Cranial nerves: There is good facial symmetry. She squeezes her eyes tightly shut when attempting to examine the pupils and fundus and refuses.  Eyes are not open to examine eomi.  Cannot participate with eom testing.  Doesn't speak at all .   Doesn't allow for examination of oropharynx.   Sensation: Patient does not  participate with sensory exam. Motor: Strength is 5/5 in the bilateral upper and lower extremities.   Shoulder shrug is equal and symmetric.  There is no pronator drift. Deep tendon reflexes: Deep tendon reflexes are 0/4 at the bilateral biceps, triceps, brachioradialis, patella and achilles. Plantar responses are downgoing bilaterally.  Movement examination: Tone: There is mod increased tone in the R>L, upper and Lower extremities.  She has rigidity, but she also has contractures of the right hand. Abnormal movements: there is intermittent RUE resting tremor.  There is R jaw tremor Coordination:  Pt refuses to participate Gait and Station: The patients daughter pulls her out of the chair to arise.  She holds her daughter around the neck.  The patients legs are flexed at the knee and she stands on the toes.  Her daughter drags her on the toes and the patient shuffles along.  She barely picks up the feet at all.   ASSESSMENT/PLAN:  1.  Probable Parkinson's disease  -It is very difficult to tell at this point in time, due to difficulty in getting history and physical exam at this point.  The patient apparently had a neurologist in [place], but those records are unavailable.  My suspicion today is that the patient did not have Lewy body disease, but rather that she had an extreme delay in diagnosis of Parkinson's disease and now has advanced Parkinson's disease with Parkinson's disease dementia.  Her delay in diagnosis has led to delay in treatment, which ultimately has led to some contractures in her hand.  I do think that she has some baseline psychiatric issues as well, which has complicated things.  Her daughter knows that she has spent some time in a mental hospital, but is unaware of prior diagnoses surrounding that.  I  do not think that we are going to have a great outcome now, given severity of disease, both mentally and physically.  We discussed goals of care, but her daughter very much wants  everything done.  I will try to change her from current carbidopa/levodopa 25/100 immediate release twice daily to Rytary 145, 1 tablet 3 times per day and see if she tolerates this.  If she does, perhaps we can increase the dose from there.  Her daughter will watch closely for hallucinations.    -Patient has physical therapy coming into the home currently.  She also has wound care therapy coming into the home.  -Patient is not a candidate for [facility] because of inability to go to the restroom by herself.  Her daughter wants to be able to care for her in an ambulatory setting, but would like more care in the home, but affording it has been tough.  2.  Sialorrhea  -Patient had very significant sialorrhea in the room.  Her entire outer coat was full of drooling.  She is certainly at risk for skin breakdown because of this.  She is not a candidate for anticholinergics because of confusion.  Daughter is interested in Myobloc.  Discussed risk, benefits, and side effects.  Daughter would like to proceed.  We will try to get authorization.  3.  I will see the patient back in the next 5 to 6 months.  We will do the Myobloc before then.  Greater than 50% of this 60-minute visit was spent in counseling with patient's daughter.  This did not include the 40 min of record review which was detailed above, which was non face to face time.   Cc:  Rankins, Fanny Dance, MD

## 2018-04-08 ENCOUNTER — Telehealth: Payer: Self-pay | Admitting: Neurology

## 2018-04-08 ENCOUNTER — Ambulatory Visit: Payer: Medicare Other | Admitting: Neurology

## 2018-04-08 ENCOUNTER — Encounter: Payer: Self-pay | Admitting: Neurology

## 2018-04-08 VITALS — BP 116/66 | HR 84

## 2018-04-08 DIAGNOSIS — F0280 Dementia in other diseases classified elsewhere without behavioral disturbance: Secondary | ICD-10-CM | POA: Diagnosis not present

## 2018-04-08 DIAGNOSIS — G20 Parkinson's disease: Secondary | ICD-10-CM

## 2018-04-08 DIAGNOSIS — R441 Visual hallucinations: Secondary | ICD-10-CM | POA: Diagnosis not present

## 2018-04-08 DIAGNOSIS — R1319 Other dysphagia: Secondary | ICD-10-CM

## 2018-04-08 MED ORDER — CARBIDOPA-LEVODOPA ER 36.25-145 MG PO CPCR: 1.0000 | ORAL_CAPSULE | Freq: Three times a day (TID) | ORAL | 5 refills | Status: DC

## 2018-04-08 MED ORDER — CARBIDOPA-LEVODOPA ER 36.25-145 MG PO CPCR: 1.0000 | ORAL_CAPSULE | Freq: Three times a day (TID) | ORAL | 0 refills | Status: AC

## 2018-04-08 NOTE — Telephone Encounter (Signed)
Please advise 

## 2018-04-08 NOTE — Telephone Encounter (Signed)
Schedule MBE.  In the meantime, patients daughter told me that she gives her greek yogurt.  Can give her meds in the greek yogurt.

## 2018-04-08 NOTE — Telephone Encounter (Signed)
Patient's daughter made aware of advise. She will try to give pill in yogurt. She states patient had swallow study in January. Will try to locate report in system.

## 2018-04-08 NOTE — Patient Instructions (Addendum)
1. Stop Carbidopa Levodopa  Start Rytary 145 mg - 1 tablet at 7am, 1 at 11am, and 1 at 4pm.   2. Myobloc appt scheduled 05/01/2018 at 3:30 pm - we will make sure insurance covers this and call you if there is an issue.

## 2018-04-08 NOTE — Telephone Encounter (Signed)
Patient called regarding her mom and her medications. She said she forgot to mention while at the appointment today that she cannot swallow pills, they all have to be crushed. She said that at 11:00 AM she will give her her regular medication. Please call her regarding an alternative way to take her medications. Thanks

## 2018-04-08 NOTE — Telephone Encounter (Signed)
Patient had bedside swallow study - not modified swallow study done at the hospital (they were unable to perform). MBE ordered.

## 2018-04-09 ENCOUNTER — Other Ambulatory Visit (HOSPITAL_COMMUNITY): Payer: Self-pay

## 2018-04-09 DIAGNOSIS — R1310 Dysphagia, unspecified: Secondary | ICD-10-CM

## 2018-04-09 NOTE — Telephone Encounter (Signed)
MBE scheduled 04/25/2018.

## 2018-04-10 ENCOUNTER — Telehealth: Payer: Self-pay | Admitting: Neurology

## 2018-04-10 NOTE — Telephone Encounter (Signed)
She has already tried the cheaper versions of the medication.  Have they called/used the [facility] resource to see if they can help with grant funding?  I gave them samples.

## 2018-04-10 NOTE — Telephone Encounter (Signed)
Neil Crouch called on the patient's behalf. She said that the Rytary medication will cost them $160.00. She wanted to see if there was another medication she could try or if that is the best one, then she will do what she can. Also, she would like to know if she can wear compression socks to help with Swelling? Please Call. Thanks

## 2018-04-11 NOTE — Telephone Encounter (Signed)
Spoke with patient's daughter. Made her aware. She will come by the office to sign the MyRytary forms. At the front for completion.

## 2018-04-11 NOTE — Telephone Encounter (Addendum)
Tried to perform a prior authorization and it was not required. This is the cost with insurance coverage. No tier exception available. Tried to call patient's daughter to make her aware. Also to offer the [facility] program, which can help with cost. Will need her to come by for a signature. No answer and vm full. Will await call back.

## 2018-04-14 NOTE — Telephone Encounter (Signed)
MyRytary forms completed and faxed to [phone] with confirmation received.

## 2018-04-15 ENCOUNTER — Other Ambulatory Visit: Payer: Self-pay | Admitting: Neurology

## 2018-04-22 ENCOUNTER — Telehealth: Payer: Self-pay | Admitting: Neurology

## 2018-04-22 NOTE — Telephone Encounter (Signed)
Spoke with patient's daughter. Rozelle Logan is sending them a 14 day supply. They should have more by them (off backorder) but if not she will let us know where else to send the prescription.

## 2018-04-22 NOTE — Telephone Encounter (Signed)
Patient's daughter [name] called regarding patient's Rytary medication being on back order until April. She uses [facility]. She said she was just starting to get the medication for free. Please Call. Thanks

## 2018-04-22 NOTE — Telephone Encounter (Signed)
She probably needs to call another pharmacy to see if available elsewhere

## 2018-04-23 ENCOUNTER — Telehealth: Payer: Self-pay | Admitting: Neurology

## 2018-04-23 ENCOUNTER — Telehealth: Payer: Self-pay

## 2018-04-23 NOTE — Telephone Encounter (Signed)
Received call from [facility] with [facility] imaging department stating that pt is scheduled Swallow study on Friday.  Rene Kocher is in the process of rescheduling all non-urgent image studies 4 weeks out.  Rene Kocher needs verbal OK from Dr. Arbutus Leas to reschedule.  Advised that Dr. Arbutus Leas was out of the office but will be in tomorrow morning.   Regina CB# [phone].  She will be in at 7:30am

## 2018-04-23 NOTE — Telephone Encounter (Signed)
I have received records from "neurology clinic [facility] in [place], [place].  Patient had neurocognitive testing in November, 2013.  Diagnosis was mild dementia, severe depression, severe anxiety.  It stated "while anxiety and depression issues are clearly the most problematic concerns with respect to her functioning, I also see support for a much more mild dementia type process.  In other words, the profile is not consistent with pseudodementia."  Records indicate that she scored a 21 on an MMSE in December, 2015 and that her prior one (does not stay when) was 19.  I only have one visit from the neurologist, which is dated September 25, 2013.  This was done in [place], [place] by Dr. Loetta Rough.  At that point in time, stated that patient had been declining in memory for 4 years and had not driven for at least 3 years at that point in time because of mistakes due to memory.  She had a relative monitoring her activities of daily living.  It stated that the patient had "slow, cautious, small steps, uses cane but can walk without it."  Otherwise, movement exam was limited.  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

## 2018-04-24 NOTE — Telephone Encounter (Signed)
Susan Simmons aware of Dr Don Perking decision.

## 2018-04-24 NOTE — Telephone Encounter (Signed)
This one is probably an urgent need

## 2018-04-25 ENCOUNTER — Ambulatory Visit (HOSPITAL_COMMUNITY): Payer: Medicare Other

## 2018-04-25 ENCOUNTER — Encounter (HOSPITAL_COMMUNITY): Payer: Medicare Other

## 2018-05-01 ENCOUNTER — Ambulatory Visit: Payer: Medicare Other | Admitting: Neurology

## 2018-05-02 ENCOUNTER — Encounter: Payer: Self-pay | Admitting: Neurology

## 2018-05-02 NOTE — Progress Notes (Signed)
Myobloc approval valid through 02/06/2019. Patient to use [employer].  

## 2018-05-15 ENCOUNTER — Other Ambulatory Visit (HOSPITAL_COMMUNITY): Payer: Self-pay | Admitting: *Deleted

## 2018-05-15 DIAGNOSIS — R1310 Dysphagia, unspecified: Secondary | ICD-10-CM

## 2018-05-19 ENCOUNTER — Telehealth: Payer: Self-pay | Admitting: Neurology

## 2018-05-19 NOTE — Telephone Encounter (Signed)
5am/9am/1pm/5pm.

## 2018-05-19 NOTE — Telephone Encounter (Signed)
Confirm currently on 3 per day.  If so, can add an additional pill.  May need to send new RX/get new authorization

## 2018-05-19 NOTE — Telephone Encounter (Signed)
She is on 3.  One at 7, 11, and 4.   How would you like her to take 4 a day?

## 2018-05-19 NOTE — Telephone Encounter (Signed)
[name] called regarding Susan Simmons and her medication Rytary. She is wanting to know if she can add an extra pill in the day? Ramsie is now getting up at 5:00 AM and she goes to bed at 5:00 PM. Please Call. Thanks

## 2018-05-20 NOTE — Telephone Encounter (Signed)
Called [name] and gave her the new times.

## 2018-06-03 ENCOUNTER — Telehealth: Payer: Self-pay | Admitting: Neurology

## 2018-06-03 NOTE — Telephone Encounter (Signed)
Daughter wants to know about the Vitamin D pills. Would she be able to take those with all her other medications? She is taking it for the Bone/wound strengthening, she said she has a wound and some bone issues so she is just trying anything that would help. Please call her back at [phone]. Thanks!

## 2018-06-03 NOTE — Telephone Encounter (Signed)
No objection from my standpoint.

## 2018-06-03 NOTE — Telephone Encounter (Signed)
Called pt daugther- its ok to take vitamin D with the other medication.

## 2018-06-11 ENCOUNTER — Emergency Department (HOSPITAL_COMMUNITY)

## 2018-06-11 ENCOUNTER — Other Ambulatory Visit: Payer: Self-pay

## 2018-06-11 ENCOUNTER — Emergency Department (HOSPITAL_COMMUNITY)
Admission: EM | Admit: 2018-06-11 | Discharge: 2018-06-11 | Disposition: A | Discharge status: Home / Self Care | Attending: Emergency Medicine | Admitting: Emergency Medicine

## 2018-06-11 ENCOUNTER — Encounter (HOSPITAL_COMMUNITY): Payer: Self-pay

## 2018-06-11 DIAGNOSIS — Z79899 Other long term (current) drug therapy: Secondary | ICD-10-CM | POA: Insufficient documentation

## 2018-06-11 DIAGNOSIS — T6591XA Toxic effect of unspecified substance, accidental (unintentional), initial encounter: Secondary | ICD-10-CM | POA: Insufficient documentation

## 2018-06-11 DIAGNOSIS — Z87891 Personal history of nicotine dependence: Secondary | ICD-10-CM | POA: Insufficient documentation

## 2018-06-11 DIAGNOSIS — R0602 Shortness of breath: Secondary | ICD-10-CM | POA: Insufficient documentation

## 2018-06-11 DIAGNOSIS — Z1159 Encounter for screening for other viral diseases: Secondary | ICD-10-CM | POA: Insufficient documentation

## 2018-06-11 DIAGNOSIS — J698 Pneumonitis due to inhalation of other solids and liquids: Secondary | ICD-10-CM | POA: Insufficient documentation

## 2018-06-11 DIAGNOSIS — J690 Pneumonitis due to inhalation of food and vomit: Secondary | ICD-10-CM

## 2018-06-11 DIAGNOSIS — F0280 Dementia in other diseases classified elsewhere without behavioral disturbance: Secondary | ICD-10-CM | POA: Insufficient documentation

## 2018-06-11 DIAGNOSIS — G20 Parkinson's disease: Secondary | ICD-10-CM | POA: Diagnosis not present

## 2018-06-11 DIAGNOSIS — R05 Cough: Secondary | ICD-10-CM | POA: Diagnosis present

## 2018-06-11 LAB — COMPREHENSIVE METABOLIC PANEL
ALT: 5 U/L (ref 0–44)
AST: 22 U/L (ref 15–41)
Albumin: 2.3 g/dL — ABNORMAL LOW (ref 3.5–5.0)
Alkaline Phosphatase: 64 U/L (ref 38–126)
Anion gap: 8 (ref 5–15)
BUN: 23 mg/dL (ref 8–23)
CO2: 26 mmol/L (ref 22–32)
Calcium: 8.5 mg/dL — ABNORMAL LOW (ref 8.9–10.3)
Chloride: 104 mmol/L (ref 98–111)
Creatinine, Ser: 0.77 mg/dL (ref 0.44–1.00)
GFR calc Af Amer: >60 mL/min (ref >60)
GFR calc non Af Amer: >60 mL/min (ref >60)
Glucose, Bld: 102 mg/dL — ABNORMAL HIGH (ref 70–99)
Potassium: 4.3 mmol/L (ref 3.5–5.1)
Sodium: 138 mmol/L (ref 135–145)
Total Bilirubin: 0.3 mg/dL (ref 0.3–1.2)
Total Protein: 7.3 g/dL (ref 6.5–8.1)

## 2018-06-11 LAB — CBC WITH DIFFERENTIAL/PLATELET
Abs Immature Granulocytes: 0.06 10*3/uL (ref 0.00–0.07)
Basophils Absolute: 0.1 10*3/uL (ref 0.0–0.1)
Basophils Relative: 1 %
Eosinophils Absolute: 0.1 10*3/uL (ref 0.0–0.5)
Eosinophils Relative: 1 %
HCT: 36.1 % (ref 36.0–46.0)
Hemoglobin: 10.8 g/dL — ABNORMAL LOW (ref 12.0–15.0)
Immature Granulocytes: 1 %
Lymphocytes Relative: 16 %
Lymphs Abs: 1.4 10*3/uL (ref 0.7–4.0)
MCH: 25.4 pg — ABNORMAL LOW (ref 26.0–34.0)
MCHC: 29.9 g/dL — ABNORMAL LOW (ref 30.0–36.0)
MCV: 84.7 fL (ref 80.0–100.0)
Monocytes Absolute: 0.5 10*3/uL (ref 0.1–1.0)
Monocytes Relative: 6 %
Neutro Abs: 6.7 10*3/uL (ref 1.7–7.7)
Neutrophils Relative %: 75 %
Platelets: 377 10*3/uL (ref 150–400)
RBC: 4.26 MIL/uL (ref 3.87–5.11)
RDW: 16.1 % — ABNORMAL HIGH (ref 11.5–15.5)
WBC: 8.8 10*3/uL (ref 4.0–10.5)
nRBC: 0 % (ref 0.0–0.2)

## 2018-06-11 LAB — LACTIC ACID, PLASMA: Lactic Acid, Venous: 1.4 mmol/L (ref 0.5–1.9)

## 2018-06-11 LAB — CBG MONITORING, ED
Glucose-Capillary: 127 mg/dL — ABNORMAL HIGH (ref 70–99)
Glucose-Capillary: 51 mg/dL — ABNORMAL LOW (ref 70–99)

## 2018-06-11 LAB — TRIGLYCERIDES: Triglycerides: 63 mg/dL (ref <150)

## 2018-06-11 LAB — D-DIMER, QUANTITATIVE: D-Dimer, Quant: 5.3 ug/mL-FEU — ABNORMAL HIGH (ref 0.00–0.50)

## 2018-06-11 LAB — LACTATE DEHYDROGENASE: LDH: 221 U/L — ABNORMAL HIGH (ref 98–192)

## 2018-06-11 LAB — FERRITIN: Ferritin: 218 ng/mL (ref 11–307)

## 2018-06-11 LAB — FIBRINOGEN: Fibrinogen: 794 mg/dL — ABNORMAL HIGH (ref 210–475)

## 2018-06-11 LAB — TROPONIN I: Troponin I: <0.03 ng/mL (ref <0.03)

## 2018-06-11 LAB — C-REACTIVE PROTEIN: CRP: 7.3 mg/dL — ABNORMAL HIGH (ref <1.0)

## 2018-06-11 LAB — SARS CORONAVIRUS 2 BY RT PCR (HOSPITAL ORDER, PERFORMED IN ~~LOC~~ HOSPITAL LAB): SARS Coronavirus 2: NEGATIVE

## 2018-06-11 LAB — PROCALCITONIN: Procalcitonin: 0.53 ng/mL

## 2018-06-11 LAB — BRAIN NATRIURETIC PEPTIDE: B Natriuretic Peptide: 131.8 pg/mL — ABNORMAL HIGH (ref 0.0–100.0)

## 2018-06-11 MED ORDER — IOHEXOL 350 MG/ML SOLN
100.0000 mL | Freq: Once | INTRAVENOUS | Status: AC | PRN
  Administered 2018-06-11: 55 mL via INTRAVENOUS

## 2018-06-11 MED ORDER — DEXTROSE 50 % IV SOLN
50.0000 mL | Freq: Once | INTRAVENOUS | Status: AC
  Administered 2018-06-11: 50 mL via INTRAVENOUS
  Filled 2018-06-11: qty 50

## 2018-06-11 MED ORDER — DEXTROSE 50 % IV SOLN
INTRAVENOUS | Status: AC
  Administered 2018-06-11: 16:00:00
  Filled 2018-06-11: qty 50

## 2018-06-11 MED ORDER — SODIUM CHLORIDE (PF) 0.9 % IJ SOLN
INTRAMUSCULAR | Status: AC
  Filled 2018-06-11: qty 50

## 2018-06-11 MED ORDER — LEVOFLOXACIN 500 MG PO TABS: 500.0000 mg | ORAL_TABLET | Freq: Every day | ORAL | 0 refills | Status: AC

## 2018-06-11 NOTE — ED Notes (Signed)
Bed: ME26 Expected date:  Expected time:  Means of arrival:  Comments: EMS/uri sx

## 2018-06-11 NOTE — ED Notes (Signed)
RX X 1 CALLED IN BY SOCIAL WORK ALISHIA

## 2018-06-11 NOTE — ED Notes (Signed)
PLEASE SEE ENCOUNTER NOTE FOR WOUND CARE AND STAGE. MULTI SITES ALL AREA. PT SEEN AT WOUND CLINIC FOR CARE.  DISCHARGE TO HOSPICE FOR CONTINUES CARE. THIS WRITER CHANGED ALL WOUNDS ON THIS VISIT. SOCIAL WORK AND EDP AWARE.  OXYGEN LABILE. PT HAS TREMORS. FAMILY IS AWARE. HOSPICE WILL BE OUT THIS EVENING.

## 2018-06-11 NOTE — Progress Notes (Signed)
CRITICAL VALUE ALERT  Critical Value:  CBG 51  Date & Time Notied: Hypoglycemic Event  CBG: 51  Treatment: D50 administration, food and juice  Symptoms: nonw  Follow-up CBG: Time: 1619 CBG Result: 127  Possible Reasons for Event: low PO intake  Comments/MD notified: Curatolo MD, orders placed    Reyne Dumas A   [id number]  Provider Notified: Curatolo MD  Orders Received/Actions taken: D50 administration with food and juice.

## 2018-06-11 NOTE — ED Notes (Signed)
[facility] called to transport patient  

## 2018-06-11 NOTE — TOC Initial Note (Signed)
Transition of Care [facility]) - Initial/Assessment Note    Patient Details  Name: Susan Simmons MRN: [id number] Date of Birth: Jul 27, 1934  [facility] CM/SW Contact:    Montine Circle, LCSW Phone Number: 06/11/2018, 10:37 AM  Clinical Narrative:    CSW spoke with patient's daughter, [name], regarding patient's needs. Patient's daughter stated that patient is receiving PT and OT in home with [facility]. She states everything is going well with [facility]. Per patient's provider in the ED patient is also receiving wound care in home. She states there is not current need for medical equipment as patient struggles to use it independetly due to weakness. Patient's daughter states she helps and support patient with ADLs and getting around.  Patient's daughter states there are no additional needs at this time.   Expected Discharge Plan: Home w Home Health Services Barriers to Discharge: No Barriers Identified   Patient Goals and CMS Choice Patient states their goals for this hospitalization and ongoing recovery are:: get healtheir CMS Medicare.gov Compare Post Acute Care list provided to:: Other (Comment Required)(not provided, already with [facility]) Choice offered to / list presented to : NA  Expected Discharge Plan and Services Expected Discharge Plan: Home w Home Health Services In-house Referral: NA Discharge Planning Services: Other - See comment(continue with home health services) Post Acute Care Choice: NA Living arrangements for the past 2 months: Single Family Home                 DME Arranged: N/A         HH Arranged: (already with [facility]) HH Agency: (already with [facility])        Prior Living Arrangements/Services Living arrangements for the past 2 months: Single Family Home Lives with:: Adult Children Patient language and need for interpreter reviewed:: No Do you feel safe going back to the place where you live?: Yes      Need for Family  Participation in Patient Care: Yes (Comment) Care giver support system in place?: Yes (comment) Current home services: Home OT, Home PT, Other (comment)(wound care) Criminal Activity/Legal Involvement Pertinent to Current Situation/Hospitalization: No - Comment as needed  Activities of Daily Living      Permission Sought/Granted Permission sought to share information with : Family Supports Permission granted to share information with : Yes, Verbal Permission Granted  Share Information with NAME: [name] [phone]     Permission granted to share info w Relationship: Daughter     Emotional Assessment Appearance:: Other (Comment Required(spoke with daughter via phone, did not meet with patient) Attitude/Demeanor/Rapport: Unable to Assess(spoke with patient's daughter via phone) Affect (typically observed): Unable to Assess(spoke with patient's daughter) Orientation: : (spoke with patient's daughter via phone) Alcohol / Substance Use: Never Used Psych Involvement: No (comment)  Admission diagnosis:  cough;shob Patient Active Problem List   Diagnosis Date Noted  . Dysphagia 03/02/2018  . Mediastinal mass 03/02/2018  . Fall 09/25/2016  . Sacral ulcer, with unspecified severity (HCC) 09/25/2016  . Suspected deep tissue injury of unknown depth of heel 09/25/2016  . Dementia due to Parkinson's disease without behavioral disturbance (HCC) 12/19/2015  . Psychosis (HCC) 12/19/2015  . AKI (acute kidney injury) (HCC) 11/22/2015  . Dehydration 11/22/2015  . Parkinson's disease (HCC) 01/25/2015  . Abnormality of gait   . Leukopenia 01/07/2015  . Anxiety 11/12/2014  . Malnutrition of moderate degree 11/10/2014  . Pressure ulcer stage II 11/09/2014  . Acute encephalopathy 08/02/2014  . Depression 05/02/2014   PCP:  Rankins, Fanny DanceVictoria R, MD Pharmacy:   [facility] [address] - [place], [place] - [address] [place] [address] Phone: [phone]  Fax: [phone]     Social Determinants of Health (SDOH) Interventions    Readmission Risk Interventions No flowsheet data found.

## 2018-06-11 NOTE — TOC Initial Note (Addendum)
Transition of Care [facility]) - Initial/Assessment Note    Patient Details  Name: Susan Simmons MRN: [id number] Date of Birth: 1934-05-30  Transition of Care [facility]) CM/SW Contact:    Elliot Cousin, RN Phone Number: 06/11/2018, 1:10 PM  Clinical Narrative:                 ED TOC CM contacted [facility] and spoke to [facility]. States pt was referred to [facility] from PCP's office on 06/10/2018. Requesting a call back if Hospice services are to start. They will close pt's case. Contacted Dtr, [name] and states she is agreeable to Hospice. States she received a call and they have an appt to start tomorrow. CM notified ED attending. Contacted Civil engineer, contracting ([facility]), Wallis Bamberg with referral. States she will work on referral for start of care today.   Pt oxygen sat 100% on RA. Plan is dc to home with Hospice. Notified [facility], Driscilla Grammes.   [facility] did not have referral. Contacted Dr Barbaraann Barthel office and referral was sent to [facility], contacted [facility], and they can come out this evening for a start of care. Cancelled referral with [facility]. Contacted pt's dtr and explained referral was arranged from PCP's office with [facility].    Expected Discharge Plan: Home w Hospice Care Barriers to Discharge: No Barriers Identified   Patient Goals and CMS Choice Patient states their goals for this hospitalization and ongoing recovery are:: make sure she is breathing better CMS Medicare.gov Compare Post Acute Care list provided to:: Patient Represenative (must comment)([name] Adams) Choice offered to / list presented to : Adult Children  Expected Discharge Plan and Services Expected Discharge Plan: Home w Hospice Care In-house Referral: Clinical Social Work Discharge Planning Services: CM Consult Post Acute Care Choice: Hospice Living arrangements for the past 2 months: Single Family Home                 DME Arranged: N/A         HH Arranged:  RN HH Agency: [facility] Date HH Agency Contacted: 06/11/18 Time HH Agency Contacted: 1130 Representative spoke with at [facility] Agency: Driscilla Grammes  Prior Living Arrangements/Services Living arrangements for the past 2 months: Single Family Home Lives with:: Adult Children Patient language and need for interpreter reviewed:: Yes Do you feel safe going back to the place where you live?: Yes      Need for Family Participation in Patient Care: Yes (Comment) Care giver support system in place?: Yes (comment) Current home services: Home RN Criminal Activity/Legal Involvement Pertinent to Current Situation/Hospitalization: No - Comment as needed  Activities of Daily Living      Permission Sought/Granted Permission sought to share information with : Case Manager, PCP, Family Supports Permission granted to share information with : Yes, Verbal Permission Granted  Share Information with NAME: [name] [phone]  Permission granted to share info w AGENCY: [facility] (Civil engineer, contracting)  Permission granted to share info w Relationship: Daughter  Permission granted to share info w Contact Information: [phone]  Emotional Assessment Appearance:: Other (Comment Required(spoke with daughter via phone, did not meet with patient) Attitude/Demeanor/Rapport: Unable to Assess(spoke with patient's daughter via phone) Affect (typically observed): Unable to Assess(spoke with patient's daughter) Orientation: : (spoke with patient's daughter via phone) Alcohol / Substance Use: Never Used Psych Involvement: No (comment)  Admission diagnosis:  cough;shob Patient Active Problem List   Diagnosis Date Noted  . Dysphagia 03/02/2018  . Mediastinal mass 03/02/2018  .  Fall 09/25/2016  . Sacral ulcer, with unspecified severity (HCC) 09/25/2016  . Suspected deep tissue injury of unknown depth of heel 09/25/2016  . Dementia due to Parkinson's disease without behavioral disturbance  (HCC) 12/19/2015  . Psychosis (HCC) 12/19/2015  . AKI (acute kidney injury) (HCC) 11/22/2015  . Dehydration 11/22/2015  . Parkinson's disease (HCC) 01/25/2015  . Abnormality of gait   . Leukopenia 01/07/2015  . Anxiety 11/12/2014  . Malnutrition of moderate degree 11/10/2014  . Pressure ulcer stage II 11/09/2014  . Acute encephalopathy 08/02/2014  . Depression 05/02/2014   PCP:  Clayborn Heronankins, [name], MD Pharmacy:   [facility] [address] - [place], [place] - [address] [place] [id number] Phone: [phone] Fax: [phone]     Social Determinants of Health (SDOH) Interventions    Readmission Risk Interventions No flowsheet data found.

## 2018-06-11 NOTE — Care Management (Signed)
SATURATION QUALIFICATIONS: (This note is used to comply with regulatory documentation for home oxygen)  Patient Saturations on Room Air at Rest = 100%    Please briefly explain why patient needs home oxygen: Civil engineer, contracting Hospice will provide oxygen at her home admission.

## 2018-06-11 NOTE — ED Notes (Signed)
ED Provider at bedside. EVALUATING CURRENT VITAL SIGNS AND PT'S NEED FOR OXYGEN AT HOME

## 2018-06-11 NOTE — ED Notes (Signed)
DISCHARGED INSTRUCTIONS DISCUSSED WITH DAUGHTER BY THIS WRITER AND SOCIAL WORK. SOFT FOOD GATHER AND GIVEN TO DAUGHTER. SOCIAL WORK INFORMED DAUGHTER HOSPICE WILL BE THERE THIS Cyndy Freeze

## 2018-06-11 NOTE — ED Notes (Signed)
Attempted to in/out cath pt. Pt began to urinate before task could be completed. RN made aware. Pure wick placed

## 2018-06-11 NOTE — ED Notes (Signed)
RETURNED FROM CT WITHOUT OXYGEN

## 2018-06-11 NOTE — ED Triage Notes (Signed)
Per [facility]- Pt resides at home with family. Shortness of breath and cough. Cough on Monday. Productive white clear. Seen by wound clinic and presented with SOB and transferred here for evaluation. Denies Fever N/V/D

## 2018-06-11 NOTE — ED Provider Notes (Signed)
[facility]-EMERGENCY DEPT Provider Note   CSN: [id number] Arrival date & time: 06/11/18  0905    History   Chief Complaint Chief Complaint  Patient presents with  . Cough  . Shortness of Breath    HPI Susan Simmons is a 84 y.o. female.     The history is provided by the patient, the EMS personnel, a caregiver and a relative.  Shortness of Breath  Severity:  Mild Onset quality:  Gradual Duration:  2 days Timing:  Constant Progression:  Unchanged Chronicity:  New Context: URI (cough with sputum production per daughter.)   Relieved by:  Nothing Worsened by:  Nothing Associated symptoms: cough and sputum production   Associated symptoms: no abdominal pain, no chest pain, no ear pain, no fever, no rash, no sore throat and no vomiting   Risk factors comment:  Parkinsons, dementia   Past Medical History:  Diagnosis Date  . Anxiety   . Depression   . Heart murmur   . Parkinson disease (HCC)   . Tooth infection 2019    Patient Active Problem List   Diagnosis Date Noted  . Dysphagia 03/02/2018  . Mediastinal mass 03/02/2018  . Fall 09/25/2016  . Sacral ulcer, with unspecified severity (HCC) 09/25/2016  . Suspected deep tissue injury of unknown depth of heel 09/25/2016  . Dementia due to Parkinson's disease without behavioral disturbance (HCC) 12/19/2015  . Psychosis (HCC) 12/19/2015  . AKI (acute kidney injury) (HCC) 11/22/2015  . Dehydration 11/22/2015  . Parkinson's disease (HCC) 01/25/2015  . Abnormality of gait   . Leukopenia 01/07/2015  . Anxiety 11/12/2014  . Malnutrition of moderate degree 11/10/2014  . Pressure ulcer stage II 11/09/2014  . Acute encephalopathy 08/02/2014  . Depression 05/02/2014    Past Surgical History:  Procedure Laterality Date  . denies surgical history       OB History   No obstetric history on file.      Home Medications    Prior to Admission medications   Medication Sig Start Date End Date  Taking? Authorizing Provider  busPIRone (BUSPAR) 15 MG tablet Take 1 tablet (15 mg total) by mouth daily. Patient taking differently: Take 15 mg by mouth 2 (two) times daily.  03/05/18  Yes Rhetta Mura, MD  Carbidopa-Levodopa ER (RYTARY) 36.25-145 MG CPCR Take 1 tablet by mouth 3 (three) times daily. Patient taking differently: Take 1 tablet by mouth 4 (four) times daily.  04/08/18  Yes Tat, Octaviano Batty, DO  collagenase (SANTYL) ointment Apply topically daily. Patient taking differently: Apply 1 application topically daily. legs 03/05/18  Yes Rhetta Mura, MD  donepezil (ARICEPT) 10 MG tablet Take 1 tablet (10 mg total) by mouth at bedtime. 03/05/18  Yes Rhetta Mura, MD  ENSURE (ENSURE) Take 237 mLs by mouth 3 (three) times daily between meals. One can three times daily to promote wound healing and support weight status   Yes [provider]  loratadine (CLARITIN) 10 MG tablet Take 10 mg by mouth daily.   Yes [provider]  mirtazapine (REMERON) 15 MG tablet Take 1 tablet (15 mg total) by mouth at bedtime. 03/05/18  Yes Rhetta Mura, MD  sodium hypochlorite (DAKIN'S 1/2 STRENGTH) external solution Apply 1 application topically daily. Wound on butt 06/03/18  Yes [provider]  levofloxacin (LEVAQUIN) 500 MG tablet Take 1 tablet (500 mg total) by mouth daily for 10 days. 06/11/18 06/21/18  Virgina Norfolk, DO    Family History Family History  Problem Relation Age  of Onset  . Aneurysm Sister   . Dementia Neg Hx   . Parkinsonism Neg Hx     Social History Social History   Tobacco Use  . Smoking status: Former Smoker    Last attempt to quit: 03/04/1996    Years since quitting: 22.2  . Smokeless tobacco: Never Used  Substance Use Topics  . Alcohol use: No  . Drug use: No     Allergies   Patient has no known allergies.   Review of Systems Review of Systems  Constitutional: Negative for chills and fever.  HENT: Negative for ear  pain and sore throat.   Eyes: Negative for pain and visual disturbance.  Respiratory: Positive for cough, sputum production and shortness of breath.   Cardiovascular: Negative for chest pain and palpitations.  Gastrointestinal: Negative for abdominal pain and vomiting.  Genitourinary: Negative for dysuria and hematuria.  Musculoskeletal: Negative for arthralgias and back pain.  Skin: Positive for wound (multiple ulcers (sacral, b/l lower leg)). Negative for color change and rash.  Neurological: Negative for seizures and syncope.  All other systems reviewed and are negative.    Physical Exam Updated Vital Signs  ED Triage Vitals  Enc Vitals Group     BP 06/11/18 0932 (!) 138/54     Pulse Rate 06/11/18 0932 88     Resp 06/11/18 0932 19     Temp --      Temp src --      SpO2 06/11/18 0929 92 %     Weight --      Height --      Head Circumference --      Peak Flow --      Pain Score --      Pain Loc --      Pain Edu? --      Excl. in GC? --     Physical Exam Vitals signs and nursing note reviewed.  Constitutional:      General: She is not in acute distress.    Appearance: She is ill-appearing (chronic, thin).  HENT:     Head: Normocephalic and atraumatic.     Mouth/Throat:     Mouth: Mucous membranes are moist.  Eyes:     Conjunctiva/sclera: Conjunctivae normal.     Pupils: Pupils are equal, round, and reactive to light.  Neck:     Musculoskeletal: Normal range of motion and neck supple.  Cardiovascular:     Rate and Rhythm: Normal rate and regular rhythm.     Pulses: Normal pulses.     Heart sounds: Normal heart sounds. No murmur.  Pulmonary:     Effort: Tachypnea present. No respiratory distress.     Breath sounds: Decreased breath sounds present.     Comments: Poor effort, coarse breath sounds Abdominal:     Palpations: Abdomen is soft.     Tenderness: There is no abdominal tenderness.  Musculoskeletal:     Right lower leg: Edema (2+) present.     Left  lower leg: Edema (2+) present.  Skin:    General: Skin is warm and dry.     Capillary Refill: Capillary refill takes less than 2 seconds.     Comments: Deep sacral wound (unstageable)and multiple ulcers of varying degrees to b/l lower extremities (heels, unstageable), no obvious drainage/redness   Neurological:     General: No focal deficit present.     Mental Status: She is alert.      ED Treatments / Results  Labs (  all labs ordered are listed, but only abnormal results are displayed) Labs Reviewed  CBC WITH DIFFERENTIAL/PLATELET - Abnormal; Notable for the following components:      Result Value   Hemoglobin 10.8 (*)    MCH 25.4 (*)    MCHC 29.9 (*)    RDW 16.1 (*)    All other components within normal limits  COMPREHENSIVE METABOLIC PANEL - Abnormal; Notable for the following components:   Glucose, Bld 102 (*)    Calcium 8.5 (*)    Albumin 2.3 (*)    All other components within normal limits  D-DIMER, QUANTITATIVE (NOT AT [facility]) - Abnormal; Notable for the following components:   D-Dimer, Quant 5.30 (*)    All other components within normal limits  LACTATE DEHYDROGENASE - Abnormal; Notable for the following components:   LDH 221 (*)    All other components within normal limits  FIBRINOGEN - Abnormal; Notable for the following components:   Fibrinogen 794 (*)    All other components within normal limits  BRAIN NATRIURETIC PEPTIDE - Abnormal; Notable for the following components:   B Natriuretic Peptide 131.8 (*)    All other components within normal limits  SARS CORONAVIRUS 2 (HOSPITAL ORDER, PERFORMED IN [facility])  CULTURE, BLOOD (ROUTINE X 2)  CULTURE, BLOOD (ROUTINE X 2)  URINE CULTURE  LACTIC ACID, PLASMA  PROCALCITONIN  FERRITIN  TRIGLYCERIDES  TROPONIN I  LACTIC ACID, PLASMA  C-REACTIVE PROTEIN  URINALYSIS, ROUTINE W REFLEX MICROSCOPIC    EKG EKG Interpretation  Date/Time:  Wednesday Jun 11 2018 09:57:00 EDT Ventricular Rate:  88 PR  Interval:    QRS Duration: 99 QT Interval:  389 QTC Calculation: 471 R Axis:   -25 Text Interpretation:  Sinus rhythm Borderline left axis deviation Probable anteroseptal infarct, old Nonspecific repol abnormality, lateral leads Artifact in lead(s) I II III aVR aVL aVF V1 V2 V3 V4 V5 V6 Confirmed by Virgina NorfolkAdam, Tyus Kallam [phone]) on 06/11/2018 11:49:15 AM   Radiology Ct Angio Chest Pe W And/or Wo Contrast  Result Date: 06/11/2018 CLINICAL DATA:  Cough and shortness of breath. EXAM: CT ANGIOGRAPHY CHEST WITH CONTRAST TECHNIQUE: Multidetector CT imaging of the chest was performed using the standard protocol during bolus administration of intravenous contrast. Multiplanar CT image reconstructions and MIPs were obtained to evaluate the vascular anatomy. CONTRAST:  55mL OMNIPAQUE IOHEXOL 350 MG/ML SOLN COMPARISON:  CT chest dated March 02, 2018. FINDINGS: Cardiovascular: No central or lobar pulmonary embolism. Evaluation of the segmental pulmonary arteries is limited due to respiratory motion artifact. Normal caliber pulmonary arteries. Normal heart size. No pericardial effusion. No thoracic aortic aneurysm or dissection. Coronary, aortic arch, and branch vessel atherosclerotic vascular disease. Mediastinum/Nodes: No enlarged mediastinal, hilar, or axillary lymph nodes. Unchanged thyroid goiter. The trachea and esophagus demonstrate no significant findings. Lungs/Pleura: New scattered patchy peribronchovascular ground-glass densities in both lower lobes and to a lesser extent the right upper and middle lobes. No pleural effusion or pneumothorax. No suspicious pulmonary nodule. Unchanged biapical pleuroparenchymal scarring. Upper Abdomen: No acute abnormality. Musculoskeletal: No chest wall abnormality. No acute or significant osseous findings. Review of the MIP images confirms the above findings. IMPRESSION: 1. No central or lobar pulmonary embolism. Evaluation of the segmental pulmonary arteries is limited due to  respiratory motion artifact. 2. New scattered patchy peribronchovascular ground-glass densities in both lower lobes and to a lesser extent the right upper and middle lobes, likely infectious or inflammatory. 3. No thoracic aortic dissection. Aortic atherosclerosis (ICD10-I70.0). Electronically Signed  By: Obie Dredge M.D.   On: 06/11/2018 12:37   Dg Chest Port 1 View  Result Date: 06/11/2018 CLINICAL DATA:  Shortness of breath and cough for 3 days. EXAM: PORTABLE CHEST 1 VIEW COMPARISON:  PA and lateral chest 03/17/2018 and 11/18/2015. CT chest 03/02/2018. FINDINGS: The lungs are clear. Heart size is upper normal. No pneumothorax or pleural effusion. Aortic atherosclerosis noted. No acute or focal bony abnormality. IMPRESSION: No acute disease. Atherosclerosis. Electronically Signed   By: Drusilla Kanner M.D.   On: 06/11/2018 09:54    Procedures Procedures (including critical care time)  Medications Ordered in ED Medications  sodium chloride (PF) 0.9 % injection (has no administration in time range)  iohexol (OMNIPAQUE) 350 MG/ML injection 100 mL (55 mLs Intravenous Contrast Given 06/11/18 1210)     Initial Impression / Assessment and Plan / ED Course  I have reviewed the triage vital signs and the nursing notes.  Pertinent labs & imaging results that were available during my care of the patient were reviewed by me and considered in my medical decision making (see chart for details).     Khalis Hittle is an 84 year old female with history of anxiety, depression, Parkinson's disease who presents to the ED with cough, shortness of breath.  Patient hypoxic upon arrival of EMS and placed on 2L of 02, but on room air here appears to fluctuate between 88-96, will leave on 2L for now and continue to re-eval during work up.  Otherwise unremarkable vitals.  Patient with overall poor respiratory effort but appears alert and able to answer questions and move extremities.  Patient daughter states  that she has Parkinson's with some dementia.  She lives at home with the daughter who takes care of her fully.  Right now she has a wound nurse coming to take care of her sacral wounds and heel wounds.  Daughter states that patient started to have a cough with sputum production since Monday.  Unknown of any fever.  Denies any lung disease.  Patient denies any chest pain, abdominal pain.  She does not have any obvious respiratory distress.  She does have diminished breath sounds throughout with coarse breath sounds.  She has chronic unstageable wounds to the sacrum and bilateral heels.  They have fairly clean margins and no obvious acute on chronic infection.  Patient has edema to bilateral lower legs.  Overall concern for possible aspiration pneumonia versus viral process including coronavirus.  Patient does not have history of heart failure, no history of PE.  Not on blood thinners.  Will evaluate with lab work including BNP, troponin, chest x-ray.  Will reevaluate but believe patient will be admitted due to new respiratory failure with hypoxia.   Pulse ox was changed to forehead and was more consistently showing patient with normal pulse ox with room air at 96-100%.  Chest x-ray showed no signs of pneumonia, pneumothorax, pleural effusion.  Procal neg. Patient with no significant leukocytosis, normal lactic acid.  EKG unremarkable.  Troponin within normal limits.  D-dimer elevated.  Doubt cardiac process.  BNP mildly elevated but reassuring chest x-ray.  However, will get CT scan of the chest to further evaluate for infection versus PE.  Case management and social work have been involved and have had extensive discussions with family as well.  Patient if discharge will have hospice in place at home in addition to home health services. Coronavirus test is negative as well. Wound dressing was done by nursing staff to all chronic sacral  and heel wounds.  If CT scan of the chest is unremarkable patient likely to be  discharged to home with hospice in place at home.  CT scan of the chest showed no PE.  There may be some new patchy groundglass densities in the lower lobes.  However overall no obvious pneumonia.  I suspect that this is likely from chronic aspiration.  Patient with no fever.  Normal and baseline mental status.  Patient has been with normal oxygen on room air since we found consistent pulse ox reading.  Hospice is now involved and will go out to the house for evaluation tonight.  Family is happy with this plan and we will send patient with prescription for Levaquin which family states helped last time she had something similar to this, this will also help with possible UTI as well.  Overall patient likely at her baseline.  Likely with chronic aspiration.  Discharged in good condition and family understands return precautions.  This chart was dictated using voice recognition software.  Despite best efforts to proofread,  errors can occur which can change the documentation meaning.   Anaysia Germer was evaluated in Emergency Department on 06/11/2018 for the symptoms described in the history of present illness. She was evaluated in the context of the global COVID-19 pandemic, which necessitated consideration that the patient might be at risk for infection with the SARS-CoV-2 virus that causes COVID-19. Institutional protocols and algorithms that pertain to the evaluation of patients at risk for COVID-19 are in a state of rapid change based on information released by regulatory bodies including the CDC and federal and state organizations. These policies and algorithms were followed during the patient's care in the ED.   Final Clinical Impressions(s) / ED Diagnoses   Final diagnoses:  Aspiration pneumonia, unspecified aspiration pneumonia type, unspecified laterality, unspecified part of lung [facility])    ED Discharge Orders         Ordered    levofloxacin (LEVAQUIN) 500 MG tablet  Daily     06/11/18 1313            [place], DO 06/11/18 1313

## 2018-06-11 NOTE — ED Notes (Signed)
ED Provider at bedside. 

## 2018-06-12 ENCOUNTER — Telehealth (HOSPITAL_BASED_OUTPATIENT_CLINIC_OR_DEPARTMENT_OTHER): Payer: Self-pay | Admitting: Emergency Medicine

## 2018-06-12 LAB — BLOOD CULTURE ID PANEL (REFLEXED)

## 2018-06-14 LAB — CULTURE, BLOOD (ROUTINE X 2): Special Requests: ADEQUATE

## 2018-06-15 NOTE — Progress Notes (Signed)
ED Antimicrobial Stewardship Positive Culture Follow Up   Susan Simmons is an 84 y.o. female who presented to [facility] on 06/11/2018 with a chief complaint of  Chief Complaint  Patient presents with  . Cough  . Shortness of Breath    Recent Results (from the past 720 hour(s))  SARS Coronavirus 2 [facility] order, Performed in [facility] hospital lab)     Status: None   Collection Time: 06/11/18 10:34 AM  Result Value Ref Range Status   SARS Coronavirus 2 NEGATIVE NEGATIVE Final    Comment: (NOTE) If result is NEGATIVE SARS-CoV-2 target nucleic acids are NOT DETECTED. The SARS-CoV-2 RNA is generally detectable in upper and lower  respiratory specimens during the acute phase of infection. The lowest  concentration of SARS-CoV-2 viral copies this assay can detect is 250  copies / mL. A negative result does not preclude SARS-CoV-2 infection  and should not be used as the sole basis for treatment or other  patient management decisions.  A negative result may occur with  improper specimen collection / handling, submission of specimen other  than nasopharyngeal swab, presence of viral mutation(s) within the  areas targeted by this assay, and inadequate number of viral copies  (<250 copies / mL). A negative result must be combined with clinical  observations, patient history, and epidemiological information. If result is POSITIVE SARS-CoV-2 target nucleic acids are DETECTED. The SARS-CoV-2 RNA is generally detectable in upper and lower  respiratory specimens dur ing the acute phase of infection.  Positive  results are indicative of active infection with SARS-CoV-2.  Clinical  correlation with patient history and other diagnostic information is  necessary to determine patient infection status.  Positive results do  not rule out bacterial infection or co-infection with other viruses. If result is PRESUMPTIVE POSTIVE SARS-CoV-2 nucleic acids MAY BE PRESENT.   A presumptive positive  result was obtained on the submitted specimen  and confirmed on repeat testing.  While 2019 novel coronavirus  (SARS-CoV-2) nucleic acids may be present in the submitted sample  additional confirmatory testing may be necessary for epidemiological  and / or clinical management purposes  to differentiate between  SARS-CoV-2 and other Sarbecovirus currently known to infect humans.  If clinically indicated additional testing with an alternate test  methodology [phone]) is advised. The SARS-CoV-2 RNA is generally  detectable in upper and lower respiratory sp ecimens during the acute  phase of infection. The expected result is Negative. Fact Sheet for Patients:  BoilerBrush.com.cy Fact Sheet for Healthcare Providers: https://pope.com/ This test is not yet approved or cleared by the [place] FDA and has been authorized for detection and/or diagnosis of SARS-CoV-2 by FDA under an Emergency Use Authorization (EUA).  This EUA will remain in effect (meaning this test can be used) for the duration of the COVID-19 declaration under Section 564(b)(1) of the Act, 21 U.S.C. section 360bbb-3(b)(1), unless the authorization is terminated or revoked sooner. Performed at [facility], [address], [place], [place] [address]   Blood Culture (routine x 2)     Status: Abnormal   Collection Time: 06/11/18 11:14 AM  Result Value Ref Range Status   Specimen Description   Final    BLOOD RIGHT ANTECUBITAL Performed at [facility], [address], [place], [place] [address]    Special Requests   Final    BOTTLES DRAWN AEROBIC AND ANAEROBIC Blood Culture adequate volume Performed at [facility], [address] [place], [place]  [address]    Culture  Setup Time   Final    GRAM POSITIVE COCCI IN CLUSTERS AEROBIC BOTTLE ONLY CRITICAL RESULT CALLED TO, READ BACK BY AND VERIFIED WITH: SIMMS RN AT  1200 ON [id number] BY SJW    Culture (A)  Final    STAPHYLOCOCCUS SPECIES (COAGULASE NEGATIVE) THE SIGNIFICANCE OF ISOLATING THIS ORGANISM FROM A SINGLE SET OF BLOOD CULTURES WHEN MULTIPLE SETS ARE DRAWN IS UNCERTAIN. PLEASE NOTIFY THE MICROBIOLOGY DEPARTMENT WITHIN ONE WEEK IF SPECIATION AND SENSITIVITIES ARE REQUIRED. Performed at [facility], [address], [place], [place] [address]    Report Status 06/14/2018 FINAL  Final  Blood Culture ID Panel (Reflexed)     Status: Abnormal   Collection Time: 06/11/18 11:14 AM  Result Value Ref Range Status   Enterococcus species NOT DETECTED NOT DETECTED Final   Listeria monocytogenes NOT DETECTED NOT DETECTED Final   Staphylococcus species DETECTED (A) NOT DETECTED Final    Comment: Methicillin (oxacillin) resistant coagulase negative staphylococcus. Possible blood culture contaminant (unless isolated from more than one blood culture draw or clinical case suggests pathogenicity). No antibiotic treatment is indicated for blood  culture contaminants. CRITICAL RESULT CALLED TO, READ BACK BY AND VERIFIED WITH: SIMMS RN AT 1200 ON [id number] BY SJW    Staphylococcus aureus (BCID) NOT DETECTED NOT DETECTED Final   Methicillin resistance DETECTED (A) NOT DETECTED Final    Comment: CRITICAL RESULT CALLED TO, READ BACK BY AND VERIFIED WITH: SIMMS RN AT 1200 ON [id number] BY SJW    Streptococcus species NOT DETECTED NOT DETECTED Final   Streptococcus agalactiae NOT DETECTED NOT DETECTED Final   Streptococcus pneumoniae NOT DETECTED NOT DETECTED Final   Streptococcus pyogenes NOT DETECTED NOT DETECTED Final   Acinetobacter baumannii NOT DETECTED NOT DETECTED Final   Enterobacteriaceae species NOT DETECTED NOT DETECTED Final   Enterobacter cloacae complex NOT DETECTED NOT DETECTED Final   Escherichia coli NOT DETECTED NOT DETECTED Final   Klebsiella oxytoca NOT DETECTED NOT DETECTED Final   Klebsiella pneumoniae NOT DETECTED NOT DETECTED Final   Proteus  species NOT DETECTED NOT DETECTED Final   Serratia marcescens NOT DETECTED NOT DETECTED Final   Haemophilus influenzae NOT DETECTED NOT DETECTED Final   Neisseria meningitidis NOT DETECTED NOT DETECTED Final   Pseudomonas aeruginosa NOT DETECTED NOT DETECTED Final   Candida albicans NOT DETECTED NOT DETECTED Final   Candida glabrata NOT DETECTED NOT DETECTED Final   Candida krusei NOT DETECTED NOT DETECTED Final   Candida parapsilosis NOT DETECTED NOT DETECTED Final   Candida tropicalis NOT DETECTED NOT DETECTED Final    Comment: Performed at [facility], [address], [place], [place] [address]  Blood Culture (routine x 2)     Status: None (Preliminary result)   Collection Time: 06/11/18 11:30 AM  Result Value Ref Range Status   Specimen Description   Final    BLOOD RIGHT FOREARM Performed at [facility], [address] [place], [place] [address]    Special Requests   Final    BOTTLES DRAWN AEROBIC ONLY Blood Culture adequate volume Performed at [facility], [address], [place], [place] [address]    Culture   Final    NO GROWTH 4 DAYS Performed at [facility], [address] [place], [place] [address]    Report Status PENDING  Incomplete    Patient was diagnosed with pneumonia and discharged on levofloxacin.   BCx (5/6): 1/3 CoNS  Discussed with ED PA Carlyle BasquesAna Hernandez - considered contaminant. No change to antibiotics.  Susan Simmons 06/15/2018, 3:08 PM Clinical Pharmacist [phone]

## 2018-06-16 LAB — CULTURE, BLOOD (ROUTINE X 2)
Culture: NO GROWTH
Special Requests: ADEQUATE

## 2018-06-17 ENCOUNTER — Ambulatory Visit (HOSPITAL_COMMUNITY): Payer: Medicare Other

## 2018-06-17 ENCOUNTER — Encounter (HOSPITAL_COMMUNITY): Payer: Medicare Other

## 2018-06-26 ENCOUNTER — Emergency Department (HOSPITAL_COMMUNITY)

## 2018-06-26 ENCOUNTER — Telehealth: Payer: Self-pay | Admitting: Neurology

## 2018-06-26 ENCOUNTER — Emergency Department (HOSPITAL_COMMUNITY)
Admission: EM | Admit: 2018-06-26 | Discharge: 2018-06-26 | Disposition: A | Discharge status: Home / Self Care | Source: Home / Self Care | Attending: Emergency Medicine | Admitting: Emergency Medicine

## 2018-06-26 ENCOUNTER — Other Ambulatory Visit: Payer: Self-pay

## 2018-06-26 ENCOUNTER — Encounter (HOSPITAL_COMMUNITY): Payer: Self-pay | Admitting: Emergency Medicine

## 2018-06-26 DIAGNOSIS — J189 Pneumonia, unspecified organism: Secondary | ICD-10-CM | POA: Diagnosis not present

## 2018-06-26 DIAGNOSIS — Z515 Encounter for palliative care: Secondary | ICD-10-CM

## 2018-06-26 DIAGNOSIS — J690 Pneumonitis due to inhalation of food and vomit: Secondary | ICD-10-CM

## 2018-06-26 LAB — BASIC METABOLIC PANEL
Anion gap: 7 (ref 5–15)
BUN: 21 mg/dL (ref 8–23)
CO2: 29 mmol/L (ref 22–32)
Calcium: 8.5 mg/dL — ABNORMAL LOW (ref 8.9–10.3)
Chloride: 104 mmol/L (ref 98–111)
Creatinine, Ser: 0.75 mg/dL (ref 0.44–1.00)
GFR calc Af Amer: >60 mL/min (ref >60)
GFR calc non Af Amer: >60 mL/min (ref >60)
Glucose, Bld: 106 mg/dL — ABNORMAL HIGH (ref 70–99)
Potassium: 4.3 mmol/L (ref 3.5–5.1)
Sodium: 140 mmol/L (ref 135–145)

## 2018-06-26 LAB — CBC WITH DIFFERENTIAL/PLATELET
Abs Immature Granulocytes: 0.06 10*3/uL (ref 0.00–0.07)
Basophils Absolute: 0.1 10*3/uL (ref 0.0–0.1)
Basophils Relative: 1 %
Eosinophils Absolute: 0 10*3/uL (ref 0.0–0.5)
Eosinophils Relative: 0 %
HCT: 37.3 % (ref 36.0–46.0)
Hemoglobin: 10.9 g/dL — ABNORMAL LOW (ref 12.0–15.0)
Immature Granulocytes: 1 %
Lymphocytes Relative: 9 %
Lymphs Abs: 0.9 10*3/uL (ref 0.7–4.0)
MCH: 24.5 pg — ABNORMAL LOW (ref 26.0–34.0)
MCHC: 29.2 g/dL — ABNORMAL LOW (ref 30.0–36.0)
MCV: 84 fL (ref 80.0–100.0)
Monocytes Absolute: 0.6 10*3/uL (ref 0.1–1.0)
Monocytes Relative: 6 %
Neutro Abs: 7.9 10*3/uL — ABNORMAL HIGH (ref 1.7–7.7)
Neutrophils Relative %: 83 %
Platelets: 462 10*3/uL — ABNORMAL HIGH (ref 150–400)
RBC: 4.44 MIL/uL (ref 3.87–5.11)
RDW: 17.2 % — ABNORMAL HIGH (ref 11.5–15.5)
WBC: 9.5 10*3/uL (ref 4.0–10.5)
nRBC: 0 % (ref 0.0–0.2)

## 2018-06-26 LAB — LACTIC ACID, PLASMA: Lactic Acid, Venous: 1.1 mmol/L (ref 0.5–1.9)

## 2018-06-26 LAB — SARS CORONAVIRUS 2 BY RT PCR (HOSPITAL ORDER, PERFORMED IN ~~LOC~~ HOSPITAL LAB): SARS Coronavirus 2: NEGATIVE

## 2018-06-26 MED ORDER — SODIUM CHLORIDE 0.9 % IV SOLN
500.0000 mg | Freq: Once | INTRAVENOUS | Status: AC
  Administered 2018-06-26: 500 mg via INTRAVENOUS
  Filled 2018-06-26: qty 500

## 2018-06-26 MED ORDER — LEVOFLOXACIN 500 MG PO TABS: 500.0000 mg | ORAL_TABLET | Freq: Every day | ORAL | 0 refills | Status: DC

## 2018-06-26 MED ORDER — SODIUM CHLORIDE 0.9 % IV SOLN
1.0000 g | Freq: Once | INTRAVENOUS | Status: AC
  Administered 2018-06-26: 13:00:00 1 g via INTRAVENOUS
  Filled 2018-06-26: qty 10

## 2018-06-26 NOTE — ED Notes (Addendum)
Pt taken off non-rebreather and placed on 2L nasal cannula. Monitor showed good waveform at 88%. O2 bumped up to 3L monitor showing good waveform at 96%. PA made aware.

## 2018-06-26 NOTE — ED Notes (Signed)
This RN has stuck patient 3 attempts for IV and blood work, all unsuccessful.

## 2018-06-26 NOTE — ED Notes (Signed)
IV team at bedside 

## 2018-06-26 NOTE — Telephone Encounter (Signed)
Pt daughter [name] called and states patient is going to be released today from [facility] and they told her to she would need to get something to help make the water thicker please advise on what she needs to get. The patient is having trouble getting water down please call

## 2018-06-26 NOTE — ED Notes (Signed)
Attempted to take pt off of O2 but SpO2 dropped to 83%. Pt placed back on 3L of O2.

## 2018-06-26 NOTE — Discharge Planning (Signed)
EDCM spoke with Wallis Bamberg of [facility].  Victorino Dike spoke with pt daughter [name].  If daughter agreeable for inpt hospice, [facility] has openings and can accept pt.

## 2018-06-26 NOTE — ED Triage Notes (Signed)
Per [facility] pt coming from home for difficulty swallowing and not able to cough up sputum per her normal. EMS had to suction pt's mouth for the sputum. Pt comes in on NRB. Pt was seen here on 5/5 for aspiration PNA and sent home with Hospice services. Pt lives with family. Per EMS daughter states that patient's MOST form at the house wasn't properly filled out so they didn't bring it with them. Daughter informed EMS that they want all measure except for CPR. Pt is able to follow commands.  Daughter, [name] [phone], can be called if need any information.  Vitals: 70s% on room air, pt placed on NRB came up to 91-95%, 108HR, 155/80.

## 2018-06-26 NOTE — ED Notes (Signed)
Robert EMT stuck patient 2 attempts for blood work, all are unsuccessful.

## 2018-06-26 NOTE — Progress Notes (Signed)
Civil engineer, contracting [facility]) Hospice  Susan Simmons is our home care hospice pt.  Her RN evaluated her yesterday with complaints of increased secretions.    Daughter [name] would like all measures except CPR.    If she meets inpatient criteria to be admitted, [facility] will continue to follow and assist with discharge planning.  She would like to be at [facility] for end of life, per wishes, if [name] decides to stop all aggressive interventions.  Thank you, Wallis Bamberg RN, BSN, CCRN [facility] Liaison [phone] (main #)

## 2018-06-26 NOTE — ED Notes (Signed)
ED Provider at bedside. 

## 2018-06-26 NOTE — ED Notes (Signed)
Bed: AL93 Expected date:  Expected time:  Means of arrival:  Comments: EMS difficulty swallowing

## 2018-06-26 NOTE — Discharge Instructions (Signed)
Keep her on 2 to 3L of oxygen by nasal cannula.  She requires oxygen to keep her oxygen saturation at a normal percentage. Give her the antibiotic as prescribed until gone. Follow-up with her primary care provider, discussed diet modifications to help prevent recurrent episodes of aspiration. The hospice care will follow-up with you to discuss increased help at home.

## 2018-06-26 NOTE — ED Provider Notes (Signed)
[facility]-EMERGENCY DEPT Provider Note   CSN: [id number] Arrival date & time: 06/26/18  [id number]    History   Chief Complaint Chief Complaint  Patient presents with  . Shortness of Breath  . Dysphagia  . Aspiration    HPI Susan Simmons is a 84 y.o. female w PMHx parkinson disease, dysphagia, on hospice care, presenting to the ED via EMS with difficulty breathing that began suddenly this morning. History obtained per patient's daughter, Susan Simmons, who is her caregiver. She states patient woke up this morning around 5 AM and was given her medications.  Her daughter states she then gave her some water around 7 AM, however soon after this she began having a congested sounding cough.  She states her mother sounded like she was unable to cough up the mucus.  She attempted some suction, however it did not get any better.  The symptoms persisted and therefore she called EMS.  Per EMS, patient was oxygenating in the 70s on room air and placed on nonrebreather with improvement to 91 to 95%.  Patient is on hospice care for Parkinson disease and chronic wound ulcers.  She has a hospice nurse to the house Monday Wednesday and Friday for wound care.  No recent fevers, known COVID-19 exposures, or other assoc symptoms. Pt is nonambulatory at baseline, and was at her mental baseline this morning.  Patient's daughter states she does not want chest compressions done, however does consent intubation as needed.  Patient normally does not require oxygen at home, however does have oxygen available to use as needed.  The history is provided by a relative. The history is limited by the condition of the patient.    Past Medical History:  Diagnosis Date  . Anxiety   . Depression   . Heart murmur   . Parkinson disease (HCC)   . Tooth infection 2019    Patient Active Problem List   Diagnosis Date Noted  . Dysphagia 03/02/2018  . Mediastinal mass 03/02/2018  . Fall 09/25/2016  . Sacral ulcer,  with unspecified severity (HCC) 09/25/2016  . Suspected deep tissue injury of unknown depth of heel 09/25/2016  . Dementia due to Parkinson's disease without behavioral disturbance (HCC) 12/19/2015  . Psychosis (HCC) 12/19/2015  . AKI (acute kidney injury) (HCC) 11/22/2015  . Dehydration 11/22/2015  . Parkinson's disease (HCC) 01/25/2015  . Abnormality of gait   . Leukopenia 01/07/2015  . Anxiety 11/12/2014  . Malnutrition of moderate degree 11/10/2014  . Pressure ulcer stage II 11/09/2014  . Acute encephalopathy 08/02/2014  . Depression 05/02/2014    Past Surgical History:  Procedure Laterality Date  . denies surgical history       OB History   No obstetric history on file.      Home Medications    Prior to Admission medications   Medication Sig Start Date End Date Taking? Authorizing Provider  busPIRone (BUSPAR) 15 MG tablet Take 1 tablet (15 mg total) by mouth daily. Patient taking differently: Take 15 mg by mouth 2 (two) times daily.  03/05/18  Yes Rhetta MuraSamtani, Jai-Gurmukh, MD  Carbidopa-Levodopa ER (RYTARY) 36.25-145 MG CPCR Take 1 tablet by mouth 3 (three) times daily. Patient taking differently: Take 1 tablet by mouth 4 (four) times daily.  04/08/18  Yes Tat, Octaviano Battyebecca S, DO  donepezil (ARICEPT) 10 MG tablet Take 1 tablet (10 mg total) by mouth at bedtime. 03/05/18  Yes Rhetta MuraSamtani, Jai-Gurmukh, MD  ENSURE (ENSURE) Take 237 mLs by mouth 3 (three) times daily  between meals. One can three times daily to promote wound healing and support weight status   Yes [provider]  loratadine (CLARITIN) 10 MG tablet Take 10 mg by mouth daily.   Yes [provider]  mirtazapine (REMERON) 15 MG tablet Take 1 tablet (15 mg total) by mouth at bedtime. 03/05/18  Yes Rhetta Mura, MD  oxymetazoline (AFRIN) 0.05 % nasal spray Place 1 spray into both nostrils 2 (two) times daily as needed for congestion.   Yes [provider]  sodium hypochlorite (DAKIN'S 1/2 STRENGTH)  external solution Apply 1 application topically daily. Wound on butt 06/03/18  Yes [provider]  collagenase (SANTYL) ointment Apply topically daily. Patient not taking: Reported on 06/26/2018 03/05/18   Rhetta Mura, MD  levofloxacin (LEVAQUIN) 500 MG tablet Take 1 tablet (500 mg total) by mouth daily. 06/26/18   Nandana Krolikowski, [place] N, PA-C    Family History Family History  Problem Relation Age of Onset  . Aneurysm Sister   . Dementia Neg Hx   . Parkinsonism Neg Hx     Social History Social History   Tobacco Use  . Smoking status: Former Smoker    Last attempt to quit: 03/04/1996    Years since quitting: 22.3  . Smokeless tobacco: Never Used  Substance Use Topics  . Alcohol use: No  . Drug use: No     Allergies   Patient has no known allergies.   Review of Systems Review of Systems  Unable to perform ROS: Patient nonverbal     Physical Exam Updated Vital Signs BP 133/64   Pulse 88   Temp (!) 97 F (36.1 C) (Rectal)   Resp 17   SpO2 96%   Physical Exam Vitals signs and nursing note reviewed.  Constitutional:      Appearance: She is well-developed.     Comments: Frail thin appearing female  HENT:     Head: Normocephalic and atraumatic.  Eyes:     Conjunctiva/sclera: Conjunctivae normal.     Pupils: Pupils are equal, round, and reactive to light.  Cardiovascular:     Rate and Rhythm: Normal rate and regular rhythm.  Pulmonary:     Breath sounds: Rhonchi present.     Comments: Wheezes and rhonchi throughout.  Some increased work of breathing.  On 10 L nonrebreather with O2 saturation 96%. Abdominal:     General: Bowel sounds are normal. There is no distension.     Palpations: Abdomen is soft.     Tenderness: There is no guarding.  Musculoskeletal:     Comments: Legs are wrapped with clean bandages to lateral lower legs from toe to chest inferior to the knee.  Skin:    General: Skin is warm.  Neurological:     Mental Status: She is alert.      Comments: Resting pill-rolling tremor to the right upper extremity.  Patient is alert and confused.  Intermittently responsive to verbal stimuli, however with confused speech.  EOM grossly normal.  Psychiatric:        Behavior: Behavior normal.      ED Treatments / Results  Labs (all labs ordered are listed, but only abnormal results are displayed) Labs Reviewed  BASIC METABOLIC PANEL - Abnormal; Notable for the following components:      Result Value   Glucose, Bld 106 (*)    Calcium 8.5 (*)    All other components within normal limits  CBC WITH DIFFERENTIAL/PLATELET - Abnormal; Notable for the following components:  Hemoglobin 10.9 (*)    MCH 24.5 (*)    MCHC 29.2 (*)    RDW 17.2 (*)    Platelets 462 (*)    Neutro Abs 7.9 (*)    All other components within normal limits  CULTURE, BLOOD (ROUTINE X 2)  SARS CORONAVIRUS 2 (HOSPITAL ORDER, PERFORMED IN [facility])  CULTURE, BLOOD (ROUTINE X 2)  LACTIC ACID, PLASMA    EKG EKG Interpretation  Date/Time:  Thursday Jun 26 2018 09:39:02 EDT Ventricular Rate:  100 PR Interval:    QRS Duration: 98 QT Interval:  342 QTC Calculation: 442 R Axis:   -49 Text Interpretation:  Sinus tachycardia Left anterior fascicular block Probable anteroseptal infarct, old Nonspecific T abnormalities, lateral leads Abnormal ekg Confirmed by Gerhard Munch [phone]) on 06/26/2018 10:16:49 AM   Radiology Dg Chest Port 1 View  Result Date: 06/26/2018 CLINICAL DATA:  Difficulty swelling. EXAM: PORTABLE CHEST 1 VIEW COMPARISON:  Jun 11, 2018 FINDINGS: The heart, hila, mediastinum, lungs, and pleura are normal. No focal infiltrate or acute abnormality. IMPRESSION: No active disease. Electronically Signed   By: Gerome Sam III M.D   On: 06/26/2018 10:45    Procedures Procedures (including critical care time)  Medications Ordered in ED Medications  cefTRIAXone (ROCEPHIN) 1 g in sodium chloride 0.9 % 100 mL IVPB (0 g Intravenous  Stopped 06/26/18 1329)  azithromycin (ZITHROMAX) 500 mg in sodium chloride 0.9 % 250 mL IVPB (0 mg Intravenous Stopped 06/26/18 1556)     Initial Impression / Assessment and Plan / ED Course  I have reviewed the triage vital signs and the nursing notes.  Pertinent labs & imaging results that were available during my care of the patient were reviewed by me and considered in my medical decision making (see chart for details).        Patient on hospice care with Parkinson disease and recurrent aspiration due to dysphagia, presenting to the emergency department with suspected aspiration of water this morning and subsequent difficulty breathing.  Patient's daughter cares for patient at home, and was having trouble helping her mother clear her secretions with suction and therefore called EMS.  She reports her mother is at her mental baseline.  On arrival, patient requiring nonrebreather with some increased respiratory effort and rhonchorous breath sounds throughout.  Thin frail appealing female.  Afebrile.  Does not meet sepsis criteria.  labs and chest x-ray obtained.  Empiric antibiotics initiated for aspiration pneumonia.  Labs without leukocytosis, slight anemia.  Electrolytes are unremarkable.  No evidence of acute organ failure.  Rapid COVID test is negative.  Lactic is within normal limits.  Blood culture sent.  Chest x-ray is negative.  Had long discussion with patient's daughter, states she feels as though she may need a little bit more assistance at home caring for her mother, as hospice is only providing wound care 3 days a week at this time.  She has oxygen at home which she normally uses as needed.  In the ED patient weaned off nonrebreather to 3 L nasal cannula and maintaining O2 saturation 96%.  Case management is in contact with hospice care, and aware of patient's daughter's need for increased help at home.  At this time, patient is safe for discharge with antibiotic to cover aspiration  pneumonia and will require oxygen until reassessed by PCP versus hospice care.  Patient discussed with and evaluated by Dr. Jeraldine Loots, who guided treatment agrees with care plan for discharge.  Final Clinical Impressions(s) /  ED Diagnoses   Final diagnoses:  Hospice care patient  Aspiration pneumonia, unspecified aspiration pneumonia type, unspecified laterality, unspecified part of lung [facility])    ED Discharge Orders         Ordered    levofloxacin (LEVAQUIN) 500 MG tablet  Daily     06/26/18 1504           Chealsey Miyamoto, [place] N, PA-C 06/27/18 1053    Gerhard Munch, MD 07/01/18 1459

## 2018-06-27 ENCOUNTER — Emergency Department (HOSPITAL_COMMUNITY)

## 2018-06-27 ENCOUNTER — Other Ambulatory Visit: Payer: Self-pay

## 2018-06-27 ENCOUNTER — Inpatient Hospital Stay (HOSPITAL_COMMUNITY)
Admission: EM | Admit: 2018-06-27 | Discharge: 2018-06-30 | DRG: 177 | Disposition: A | Discharge status: Hospice | Attending: Internal Medicine | Admitting: Internal Medicine

## 2018-06-27 DIAGNOSIS — L8990 Pressure ulcer of unspecified site, unspecified stage: Secondary | ICD-10-CM

## 2018-06-27 DIAGNOSIS — G20A1 Parkinson's disease without dyskinesia, without mention of fluctuations: Secondary | ICD-10-CM | POA: Diagnosis present

## 2018-06-27 DIAGNOSIS — Z515 Encounter for palliative care: Secondary | ICD-10-CM | POA: Diagnosis present

## 2018-06-27 DIAGNOSIS — J690 Pneumonitis due to inhalation of food and vomit: Secondary | ICD-10-CM | POA: Diagnosis not present

## 2018-06-27 DIAGNOSIS — Z66 Do not resuscitate: Secondary | ICD-10-CM | POA: Diagnosis not present

## 2018-06-27 DIAGNOSIS — L89620 Pressure ulcer of left heel, unstageable: Secondary | ICD-10-CM | POA: Diagnosis present

## 2018-06-27 DIAGNOSIS — J189 Pneumonia, unspecified organism: Secondary | ICD-10-CM

## 2018-06-27 DIAGNOSIS — E440 Moderate protein-calorie malnutrition: Secondary | ICD-10-CM | POA: Diagnosis present

## 2018-06-27 DIAGNOSIS — F0280 Dementia in other diseases classified elsewhere without behavioral disturbance: Secondary | ICD-10-CM | POA: Diagnosis present

## 2018-06-27 DIAGNOSIS — Z681 Body mass index (BMI) 19 or less, adult: Secondary | ICD-10-CM

## 2018-06-27 DIAGNOSIS — L89610 Pressure ulcer of right heel, unstageable: Secondary | ICD-10-CM | POA: Diagnosis present

## 2018-06-27 DIAGNOSIS — R1310 Dysphagia, unspecified: Secondary | ICD-10-CM | POA: Diagnosis present

## 2018-06-27 DIAGNOSIS — G20 Parkinson's disease: Secondary | ICD-10-CM | POA: Diagnosis present

## 2018-06-27 DIAGNOSIS — Z87891 Personal history of nicotine dependence: Secondary | ICD-10-CM

## 2018-06-27 DIAGNOSIS — J9611 Chronic respiratory failure with hypoxia: Secondary | ICD-10-CM | POA: Diagnosis present

## 2018-06-27 DIAGNOSIS — R680 Hypothermia, not associated with low environmental temperature: Secondary | ICD-10-CM | POA: Diagnosis present

## 2018-06-27 DIAGNOSIS — Z9981 Dependence on supplemental oxygen: Secondary | ICD-10-CM

## 2018-06-27 DIAGNOSIS — Z20828 Contact with and (suspected) exposure to other viral communicable diseases: Secondary | ICD-10-CM | POA: Diagnosis present

## 2018-06-27 DIAGNOSIS — L89893 Pressure ulcer of other site, stage 3: Secondary | ICD-10-CM | POA: Diagnosis present

## 2018-06-27 LAB — CBC WITH DIFFERENTIAL/PLATELET
Abs Immature Granulocytes: 0.04 10*3/uL (ref 0.00–0.07)
Basophils Absolute: 0 10*3/uL (ref 0.0–0.1)
Basophils Relative: 1 %
Eosinophils Absolute: 0.2 10*3/uL (ref 0.0–0.5)
Eosinophils Relative: 2 %
HCT: 37.2 % (ref 36.0–46.0)
Hemoglobin: 10.6 g/dL — ABNORMAL LOW (ref 12.0–15.0)
Immature Granulocytes: 1 %
Lymphocytes Relative: 20 %
Lymphs Abs: 1.7 10*3/uL (ref 0.7–4.0)
MCH: 23.7 pg — ABNORMAL LOW (ref 26.0–34.0)
MCHC: 28.5 g/dL — ABNORMAL LOW (ref 30.0–36.0)
MCV: 83.2 fL (ref 80.0–100.0)
Monocytes Absolute: 0.5 10*3/uL (ref 0.1–1.0)
Monocytes Relative: 6 %
Neutro Abs: 6.2 10*3/uL (ref 1.7–7.7)
Neutrophils Relative %: 70 %
Platelets: 495 10*3/uL — ABNORMAL HIGH (ref 150–400)
RBC: 4.47 MIL/uL (ref 3.87–5.11)
RDW: 16.8 % — ABNORMAL HIGH (ref 11.5–15.5)
WBC: 8.7 10*3/uL (ref 4.0–10.5)
nRBC: 0 % (ref 0.0–0.2)

## 2018-06-27 LAB — BASIC METABOLIC PANEL
Anion gap: 10 (ref 5–15)
BUN: 31 mg/dL — ABNORMAL HIGH (ref 8–23)
CO2: 25 mmol/L (ref 22–32)
Calcium: 8.8 mg/dL — ABNORMAL LOW (ref 8.9–10.3)
Chloride: 103 mmol/L (ref 98–111)
Creatinine, Ser: 0.84 mg/dL (ref 0.44–1.00)
GFR calc Af Amer: >60 mL/min (ref >60)
GFR calc non Af Amer: >60 mL/min (ref >60)
Glucose, Bld: 109 mg/dL — ABNORMAL HIGH (ref 70–99)
Potassium: 4 mmol/L (ref 3.5–5.1)
Sodium: 138 mmol/L (ref 135–145)

## 2018-06-27 LAB — TROPONIN I: Troponin I: <0.03 ng/mL (ref <0.03)

## 2018-06-27 LAB — SARS CORONAVIRUS 2 BY RT PCR (HOSPITAL ORDER, PERFORMED IN ~~LOC~~ HOSPITAL LAB): SARS Coronavirus 2: NEGATIVE

## 2018-06-27 MED ORDER — AZITHROMYCIN 250 MG PO TABS: 500.0000 mg | ORAL_TABLET | ORAL | Status: DC

## 2018-06-27 MED ORDER — SODIUM CHLORIDE 0.9 % IV SOLN
500.0000 mg | Freq: Once | INTRAVENOUS | Status: AC
  Administered 2018-06-27: 22:00:00 500 mg via INTRAVENOUS
  Filled 2018-06-27: qty 500

## 2018-06-27 MED ORDER — SODIUM CHLORIDE 0.9 % IV SOLN
1.0000 g | INTRAVENOUS | Status: DC
  Administered 2018-06-27: 22:00:00 1 g via INTRAVENOUS
  Filled 2018-06-27: qty 10

## 2018-06-27 NOTE — H&P (Addendum)
History and Physical    Sabino GasserShirley Mcconico YQM:[id number]:[id number] DOB: 1934-10-10 DOA: 06/27/2018  PCP: Clayborn Heronankins, [name], MD  Patient coming from: home   Chief Complaint: aspiration event  HPI: Sabino GasserShirley Muzio is a 84 y.o. female with medical history significant for advanced parkinson's dementia who presents with above.  Lives at home with her daughter. Receiving outpatient hospice services from [facility]. That was started relatively recently.  History obtained form patient's daughter and ED staff as patient is not communicative.  Per daughter's report, patient often chokes when drinking and eating. Had an episode of choking yesterday causing respiratory distress, evaluated in this ED, discharged home with levofloxacin rx. Had another episode today of choking while drinking water, developed worsened respiratory distress. On O2 at home, per report hypoxic with EMS and on a non-rebreather, at the time of my exam breathing comfortably with O2 at 4 l/min and o2 100%. Is on 2 L at home. Per daughter patient is not as alert as she normally is, typically able to make basic needs known, oriented to self.   ED Course: labs, cxr, antibiotics  Review of Systems: As per HPI otherwise 10 point review of systems negative.    Past Medical History:  Diagnosis Date  . Anxiety   . Depression   . Heart murmur   . Parkinson disease (HCC)   . Tooth infection 2019    Past Surgical History:  Procedure Laterality Date  . denies surgical history       reports that she quit smoking about 22 years ago. She has never used smokeless tobacco. She reports that she does not drink alcohol or use drugs.  No Known Allergies  Family History  Problem Relation Age of Onset  . Aneurysm Sister   . Dementia Neg Hx   . Parkinsonism Neg Hx     Prior to Admission medications   Medication Sig Start Date End Date Taking? Authorizing Provider  busPIRone (BUSPAR) 15 MG tablet Take 1 tablet (15 mg total) by  mouth daily. Patient taking differently: Take 15 mg by mouth 2 (two) times daily.  03/05/18  Yes Rhetta MuraSamtani, Jai-Gurmukh, MD  Carbidopa-Levodopa ER (RYTARY) 36.25-145 MG CPCR Take 1 tablet by mouth 3 (three) times daily. Patient taking differently: Take 1 tablet by mouth 4 (four) times daily.  04/08/18  Yes Tat, Octaviano Battyebecca S, DO  donepezil (ARICEPT) 10 MG tablet Take 1 tablet (10 mg total) by mouth at bedtime. 03/05/18  Yes Rhetta MuraSamtani, Jai-Gurmukh, MD  ENSURE (ENSURE) Take 237 mLs by mouth 3 (three) times daily between meals.    Yes [provider]  levofloxacin (LEVAQUIN) 500 MG tablet Take 1 tablet (500 mg total) by mouth daily. 06/26/18  Yes Robinson, [place] N, PA-C  loratadine (CLARITIN) 10 MG tablet Take 10 mg by mouth daily.   Yes [provider]  mirtazapine (REMERON) 15 MG tablet Take 1 tablet (15 mg total) by mouth at bedtime. 03/05/18  Yes Rhetta MuraSamtani, Jai-Gurmukh, MD  sodium hypochlorite (DAKIN'S 1/2 STRENGTH) external solution Apply 1 application topically daily. Wound on butt 06/03/18  Yes [provider]  collagenase (SANTYL) ointment Apply topically daily. Patient not taking: Reported on 06/26/2018 03/05/18   Rhetta MuraSamtani, Jai-Gurmukh, MD    Physical Exam: Vitals:   06/27/18 2245 06/27/18 2315 06/27/18 2341 06/27/18 2344  BP: (!) 99/46 (!) 95/40 128/74   Pulse: 78 75 82   Resp: (!) 24 20 (!) 23   Temp:    97.8 F (36.6 C)  TempSrc:  Rectal  SpO2: 100% 100% 100%     Constitutional: cachectic, chronically ill, non-verbal, body tremor Head: Atraumatic Eyes: Conjunctiva clear ENM: dry mucous membranes, poor dentition Neck: Supple Respiratory: poor inspiratory effort. Mild tachypnea. Rales at bases. Cardiovascular: Regular rate and rhythm. Mod systolic murmur Abdomen: Non-tender, non-distended. No masses. No rebound or guarding. Positive bowel sounds. Musculoskeletal: legs rotated to left, muscle wasting, mild contractures Skin: stage 2/3 pressure ulcers on heels and  calves bilaterally. On sacrum stage 3 at least pressure ulcers (one is packed, packing not removed by me)  Extremities: No peripheral edema. Palpable peripheral pulses. Neurologic: alert, moans when spoken to    Labs on Admission: I have personally reviewed following labs and imaging studies  CBC: Recent Labs  Lab 06/26/18 1217 06/27/18 2004  WBC 9.5 8.7  NEUTROABS 7.9* 6.2  HGB 10.9* 10.6*  HCT 37.3 37.2  MCV 84.0 83.2  PLT 462* 495*   Basic Metabolic Panel: Recent Labs  Lab 06/26/18 1217 06/27/18 2004  NA 140 138  K 4.3 4.0  CL 104 103  CO2 29 25  GLUCOSE 106* 109*  BUN 21 31*  CREATININE 0.75 0.84  CALCIUM 8.5* 8.8*   GFR: CrCl cannot be calculated (Unknown ideal weight.). Liver Function Tests: No results for input(s): AST, ALT, ALKPHOS, BILITOT, PROT, ALBUMIN in the last 168 hours. No results for input(s): LIPASE, AMYLASE in the last 168 hours. No results for input(s): AMMONIA in the last 168 hours. Coagulation Profile: No results for input(s): INR, PROTIME in the last 168 hours. Cardiac Enzymes: Recent Labs  Lab 06/27/18 2004  TROPONINI <0.03   BNP (last 3 results) No results for input(s): PROBNP in the last 8760 hours. HbA1C: No results for input(s): HGBA1C in the last 72 hours. CBG: No results for input(s): GLUCAP in the last 168 hours. Lipid Profile: No results for input(s): CHOL, HDL, LDLCALC, TRIG, CHOLHDL, LDLDIRECT in the last 72 hours. Thyroid Function Tests: No results for input(s): TSH, T4TOTAL, FREET4, T3FREE, THYROIDAB in the last 72 hours. Anemia Panel: No results for input(s): VITAMINB12, FOLATE, FERRITIN, TIBC, IRON, RETICCTPCT in the last 72 hours. Urine analysis:    Component Value Date/Time   COLORURINE STRAW (A) 03/02/2018 1414   APPEARANCEUR CLEAR 03/02/2018 1414   LABSPEC 1.003 (L) 03/02/2018 1414   PHURINE 6.0 03/02/2018 1414   GLUCOSEU NEGATIVE 03/02/2018 1414   HGBUR NEGATIVE 03/02/2018 1414   BILIRUBINUR NEGATIVE  03/02/2018 1414   KETONESUR NEGATIVE 03/02/2018 1414   PROTEINUR NEGATIVE 03/02/2018 1414   UROBILINOGEN 1.0 11/09/2014 0759   NITRITE NEGATIVE 03/02/2018 1414   LEUKOCYTESUR NEGATIVE 03/02/2018 1414    Radiological Exams on Admission: Dg Chest Port 1 View  Result Date: 06/27/2018 CLINICAL DATA:  Shortness of breath and confusion. EXAM: PORTABLE CHEST 1 VIEW COMPARISON:  06/26/2018 FINDINGS: There is a retrocardiac opacity. There is some elevation of the left hemidiaphragm. There is no pneumothorax. The lungs are somewhat hyperexpanded. There is no acute osseous abnormality. IMPRESSION: New retrocardiac opacity concerning for developing pneumonia or aspiration. A small underlying pleural effusion is not excluded. Electronically Signed   By: Katherine Mantle M.D.   On: 06/27/2018 20:03   Dg Chest Port 1 View  Result Date: 06/26/2018 CLINICAL DATA:  Difficulty swelling. EXAM: PORTABLE CHEST 1 VIEW COMPARISON:  Jun 11, 2018 FINDINGS: The heart, hila, mediastinum, lungs, and pleura are normal. No focal infiltrate or acute abnormality. IMPRESSION: No active disease. Electronically Signed   By: Gerome Sam III M.D   On:  06/26/2018 10:45    EKG: Independently reviewed. nsr  Assessment/Plan Active Problems:   Malnutrition of moderate degree   Parkinson's disease (HCC)   Dementia due to Parkinson's disease without behavioral disturbance (HCC)   Aspiration pneumonia (HCC)   # Aspiration pneumonia - multiple recent aspiration events in setting of advanced pneumonia. Increased O2 requirement and retrocardiac opacity seen on CXR. May be chemical pneumonitis but given increased o2 demands and findings on imaging and family preference, reasonable to treat with antibiotics. Initially hypothermic to 96.7, resolved to 97.8. - IV unasyn per pharmacy - SLP swallow eval, npo for now - fluids @ 100 - ongoing goals of care discussion w/ daughter as disconnect between hospice status and family's  relatively aggressive treatment approach  # Chronic respiratory failure - on 2 L at home - currently on 4 L, wean as tolerated  # Decubitus ulcers - multiple, some at least stage 3. Do not appear infected. - wound consult  # Parkinson's dementia - continue home buspar, mirtazapine, carbidopa/levodopa  DVT prophylaxis: lovenox Code Status: limited (desires intubation, no cardiac resuscitation)  Family Communication: Daughter Neil Crouch  [phone] Disposition Plan: tbd Consults called: none  Admission status: obs med/surg    Silvano Bilis MD [facility] Pager [phone]  If 7PM-7AM, please contact night-coverage [email or web address] Password [facility]  06/27/2018, 11:56 PM

## 2018-06-27 NOTE — ED Notes (Signed)
Daughter Neil Crouch  [phone]

## 2018-06-27 NOTE — ED Notes (Signed)
Bed: NU27 Expected date:  Expected time:  Means of arrival:  Comments: 84 yo SOB, leg wounds, sepsis?

## 2018-06-27 NOTE — ED Notes (Signed)
Patient taken off of non-rebreather and placed on 4L of O2 via [place]. Patient SpO2 remains at 100% on 4L. Patient is currently sleeping and still has shallow breathing and tachypnea (22-25 breaths per minute).

## 2018-06-27 NOTE — ED Provider Notes (Signed)
[facility]-EMERGENCY DEPT Provider Note   CSN: [id number] Arrival date & time: 06/27/18  1848    History   Chief Complaint Chief Complaint  Patient presents with  . Shortness of Breath  . Altered Mental Status    HPI Susan Simmons is a 84 y.o. female.     Patient is a 84 year old female who presents with shortness of breath.  She has a history of Parkinson's and chronic lower extremity ulcers as well as recurrent aspiration related to dysphasia.  She was seen here in the emergency department yesterday for a choking episode and shortness of breath.  Her symptoms went back to baseline during the ED stay and she was discharged home.  She is currently in home hospice.  Her daughter states that about 20 minutes after taking her medication and some yogurt tonight she started coughing and choking.  Her oxygen levels dropped and she was indicating that she was not doing okay.  History is limited due to patient's mental status and inability to communicate.  Per chart review, she is normally oriented to person and place.  Currently she will open her eyes but is nonverbal.  She has not had any known fevers per her daughter.  I did discuss goals of care with the patient's daughter.  At this point she wants aggressive treatment and request that patient be admitted to the hospital given her episodes of choking and shortness of breath over the last 2 days.  She says the patient does not want CPR/chest compressions but is okay with intubation.     Past Medical History:  Diagnosis Date  . Anxiety   . Depression   . Heart murmur   . Parkinson disease (HCC)   . Tooth infection 2019    Patient Active Problem List   Diagnosis Date Noted  . Dysphagia 03/02/2018  . Mediastinal mass 03/02/2018  . Fall 09/25/2016  . Sacral ulcer, with unspecified severity (HCC) 09/25/2016  . Suspected deep tissue injury of unknown depth of heel 09/25/2016  . Dementia due to Parkinson's disease  without behavioral disturbance (HCC) 12/19/2015  . Psychosis (HCC) 12/19/2015  . AKI (acute kidney injury) (HCC) 11/22/2015  . Dehydration 11/22/2015  . Parkinson's disease (HCC) 01/25/2015  . Abnormality of gait   . Leukopenia 01/07/2015  . Anxiety 11/12/2014  . Malnutrition of moderate degree 11/10/2014  . Pressure ulcer stage II 11/09/2014  . Acute encephalopathy 08/02/2014  . Depression 05/02/2014    Past Surgical History:  Procedure Laterality Date  . denies surgical history       OB History   No obstetric history on file.      Home Medications    Prior to Admission medications   Medication Sig Start Date End Date Taking? Authorizing Provider  busPIRone (BUSPAR) 15 MG tablet Take 1 tablet (15 mg total) by mouth daily. Patient taking differently: Take 15 mg by mouth 2 (two) times daily.  03/05/18   Rhetta Mura, MD  Carbidopa-Levodopa ER (RYTARY) 36.25-145 MG CPCR Take 1 tablet by mouth 3 (three) times daily. Patient taking differently: Take 1 tablet by mouth 4 (four) times daily.  04/08/18   Tat, Octaviano Batty, DO  collagenase (SANTYL) ointment Apply topically daily. Patient not taking: Reported on 06/26/2018 03/05/18   Rhetta Mura, MD  donepezil (ARICEPT) 10 MG tablet Take 1 tablet (10 mg total) by mouth at bedtime. 03/05/18   Rhetta Mura, MD  ENSURE (ENSURE) Take 237 mLs by mouth 3 (three) times daily between  meals. One can three times daily to promote wound healing and support weight status    [provider]  levofloxacin (LEVAQUIN) 500 MG tablet Take 1 tablet (500 mg total) by mouth daily. 06/26/18   Robinson, [place] N, PA-C  loratadine (CLARITIN) 10 MG tablet Take 10 mg by mouth daily.    [provider]  mirtazapine (REMERON) 15 MG tablet Take 1 tablet (15 mg total) by mouth at bedtime. 03/05/18   Rhetta Mura, MD  oxymetazoline (AFRIN) 0.05 % nasal spray Place 1 spray into both nostrils 2 (two) times daily as needed for  congestion.    [provider]  sodium hypochlorite (DAKIN'S 1/2 STRENGTH) external solution Apply 1 application topically daily. Wound on butt 06/03/18   [provider]    Family History Family History  Problem Relation Age of Onset  . Aneurysm Sister   . Dementia Neg Hx   . Parkinsonism Neg Hx     Social History Social History   Tobacco Use  . Smoking status: Former Smoker    Last attempt to quit: 03/04/1996    Years since quitting: 22.3  . Smokeless tobacco: Never Used  Substance Use Topics  . Alcohol use: No  . Drug use: No     Allergies   Patient has no known allergies.   Review of Systems Review of Systems  Unable to perform ROS: Mental status change     Physical Exam Updated Vital Signs BP (!) 117/50 (BP Location: Left Arm)   Pulse 80   Temp (!) 96.7 F (35.9 C) (Rectal)   Resp 14   SpO2 100%   Physical Exam Constitutional:      Appearance: She is well-developed. She is ill-appearing.  HENT:     Head: Normocephalic and atraumatic.  Eyes:     Pupils: Pupils are equal, round, and reactive to light.  Neck:     Musculoskeletal: Normal range of motion and neck supple.  Cardiovascular:     Rate and Rhythm: Regular rhythm. Tachycardia present.     Heart sounds: Normal heart sounds.  Pulmonary:     Effort: Pulmonary effort is normal. Tachypnea present. No respiratory distress.     Breath sounds: Rhonchi present. No wheezing or rales.  Chest:     Chest wall: No tenderness.  Abdominal:     General: Bowel sounds are normal.     Palpations: Abdomen is soft.     Tenderness: There is no abdominal tenderness. There is no guarding or rebound.  Musculoskeletal: Normal range of motion.     Right lower leg: Edema present.     Left lower leg: Edema present.     Comments: Chronic ulcerations to her lower extremities without suggestions of cellulitis  Lymphadenopathy:     Cervical: No cervical adenopathy.  Skin:    General: Skin is warm and  dry.     Findings: No rash.  Neurological:     Mental Status: She is alert and oriented to person, place, and time.      ED Treatments / Results  Labs (all labs ordered are listed, but only abnormal results are displayed) Labs Reviewed  BASIC METABOLIC PANEL - Abnormal; Notable for the following components:      Result Value   Glucose, Bld 109 (*)    BUN 31 (*)    Calcium 8.8 (*)    All other components within normal limits  CBC WITH DIFFERENTIAL/PLATELET - Abnormal; Notable for the following components:   Hemoglobin 10.6 (*)  MCH 23.7 (*)    MCHC 28.5 (*)    RDW 16.8 (*)    Platelets 495 (*)    All other components within normal limits  SARS CORONAVIRUS 2 (HOSPITAL ORDER[facility])  TROPONIN I    EKG EKG Interpretation  Date/Time:  Friday Jun 27 2018 19:48:38 EDT Ventricular Rate:  84 PR Interval:    QRS Duration: 100 QT Interval:  392 QTC Calculation: 464 R Axis:   -42 Text Interpretation:  Sinus rhythm LVH with secondary repolarization abnormality since last tracing no significant change Confirmed by Rolan BuccoBelfi, Minnie Legros [phone]([id number]) on 06/27/2018 8:16:05 PM   Radiology Dg Chest Port 1 View  Result Date: 06/27/2018 CLINICAL DATA:  Shortness of breath and confusion. EXAM: PORTABLE CHEST 1 VIEW COMPARISON:  06/26/2018 FINDINGS: There is a retrocardiac opacity. There is some elevation of the left hemidiaphragm. There is no pneumothorax. The lungs are somewhat hyperexpanded. There is no acute osseous abnormality. IMPRESSION: New retrocardiac opacity concerning for developing pneumonia or aspiration. A small underlying pleural effusion is not excluded. Electronically Signed   By: Katherine Mantlehristopher  Green M.D.   On: 06/27/2018 20:03   Dg Chest Port 1 View  Result Date: 06/26/2018 CLINICAL DATA:  Difficulty swelling. EXAM: PORTABLE CHEST 1 VIEW COMPARISON:  Jun 11, 2018 FINDINGS: The heart, hila, mediastinum, lungs, and pleura are normal. No focal infiltrate  or acute abnormality. IMPRESSION: No active disease. Electronically Signed   By: Gerome Samavid  Williams III M.D   On: 06/26/2018 10:45    Procedures Procedures (including critical care time)  Medications Ordered in ED Medications  cefTRIAXone (ROCEPHIN) 1 g in sodium chloride 0.9 % 100 mL IVPB (1 g Intravenous New Bag/Given 06/27/18 2130)  azithromycin (ZITHROMAX) 500 mg in sodium chloride 0.9 % 250 mL IVPB (500 mg Intravenous New Bag/Given 06/27/18 2204)     Initial Impression / Assessment and Plan / ED Course  I have reviewed the triage vital signs and the nursing notes.  Pertinent labs & imaging results that were available during my care of the patient were reviewed by me and considered in my medical decision making (see chart for details).        Patient is a 84 year old female who presents with a choking episode.  She has a history of recurrent aspiration.  She was markedly hypoxic on arrival, requiring a nonrebreather mask to maintain sats.  However this was able to be transitioned to a nasal cannula and currently she is maintaining normal oxygen saturations at 4 L/min by nasal cannula.  Chest x-ray shows a retrocardiac infiltrate.  She was started on IV antibiotics.  Her labs are otherwise non-concerning.  Her COVID test is negative.  I spoke with Dr. Ashok PallWouk who will admit the pt. I updated the patient's daughter, Susan Simmons with the plan as well.  She states that the patient does not want CPR but is okay with intubation/ventilator support.  She still requests aggressive treatment.  CRITICAL CARE Performed by: Rolan BuccoMelanie Vanisha Whiten Total critical care time: 60 minutes Critical care time was exclusive of separately billable procedures and treating other patients. Critical care was necessary to treat or prevent imminent or life-threatening deterioration. Critical care was time spent personally by me on the following activities: development of treatment plan with patient and/or surrogate as well as  nursing, discussions with consultants, evaluation of patient's response to treatment, examination of patient, obtaining history from patient or surrogate, ordering and performing treatments and interventions, ordering and review of laboratory studies,  ordering and review of radiographic studies, pulse oximetry and re-evaluation of patient's condition.   Final Clinical Impressions(s) / ED Diagnoses   Final diagnoses:  Community acquired pneumonia, unspecified laterality    ED Discharge Orders    None       Rolan Bucco, MD 06/27/18 2221

## 2018-06-27 NOTE — ED Notes (Signed)
Dr. Fredderick Phenix at bedside, bedside report given to oncoming shift [facility]. Charge nurse Taquita currently contacting hospice.

## 2018-06-27 NOTE — Telephone Encounter (Signed)
Called spoke with patient daughter [name]  She said disregard call she found what she needed

## 2018-06-27 NOTE — ED Triage Notes (Signed)
Pt from home via EMS chief complaint [facility] and increased confusion. Family reports AO X2 baseline., EMS reports AOX1 at this time. Notable labored breathing and rhonchi wheezing. Pt on non rebreather. EMS 2 failed IV attempts. Pt has hx of Parkinsens, currently contracted. Per EMS pt has wounds on legs bilat.

## 2018-06-28 ENCOUNTER — Encounter (HOSPITAL_COMMUNITY): Payer: Self-pay | Admitting: *Deleted

## 2018-06-28 ENCOUNTER — Other Ambulatory Visit: Payer: Self-pay

## 2018-06-28 DIAGNOSIS — Z87891 Personal history of nicotine dependence: Secondary | ICD-10-CM | POA: Diagnosis not present

## 2018-06-28 DIAGNOSIS — R1310 Dysphagia, unspecified: Secondary | ICD-10-CM | POA: Diagnosis present

## 2018-06-28 DIAGNOSIS — G20 Parkinson's disease: Secondary | ICD-10-CM | POA: Diagnosis present

## 2018-06-28 DIAGNOSIS — J690 Pneumonitis due to inhalation of food and vomit: Secondary | ICD-10-CM | POA: Diagnosis present

## 2018-06-28 DIAGNOSIS — L89893 Pressure ulcer of other site, stage 3: Secondary | ICD-10-CM | POA: Diagnosis present

## 2018-06-28 DIAGNOSIS — L89620 Pressure ulcer of left heel, unstageable: Secondary | ICD-10-CM | POA: Diagnosis present

## 2018-06-28 DIAGNOSIS — Z66 Do not resuscitate: Secondary | ICD-10-CM | POA: Diagnosis not present

## 2018-06-28 DIAGNOSIS — Z9981 Dependence on supplemental oxygen: Secondary | ICD-10-CM | POA: Diagnosis not present

## 2018-06-28 DIAGNOSIS — Z681 Body mass index (BMI) 19 or less, adult: Secondary | ICD-10-CM | POA: Diagnosis not present

## 2018-06-28 DIAGNOSIS — F0280 Dementia in other diseases classified elsewhere without behavioral disturbance: Secondary | ICD-10-CM | POA: Diagnosis present

## 2018-06-28 DIAGNOSIS — J189 Pneumonia, unspecified organism: Secondary | ICD-10-CM | POA: Diagnosis present

## 2018-06-28 DIAGNOSIS — L89610 Pressure ulcer of right heel, unstageable: Secondary | ICD-10-CM | POA: Diagnosis present

## 2018-06-28 DIAGNOSIS — J9611 Chronic respiratory failure with hypoxia: Secondary | ICD-10-CM | POA: Diagnosis present

## 2018-06-28 DIAGNOSIS — E440 Moderate protein-calorie malnutrition: Secondary | ICD-10-CM | POA: Diagnosis present

## 2018-06-28 DIAGNOSIS — Z515 Encounter for palliative care: Secondary | ICD-10-CM | POA: Diagnosis present

## 2018-06-28 DIAGNOSIS — L8990 Pressure ulcer of unspecified site, unspecified stage: Secondary | ICD-10-CM

## 2018-06-28 DIAGNOSIS — Z20828 Contact with and (suspected) exposure to other viral communicable diseases: Secondary | ICD-10-CM | POA: Diagnosis present

## 2018-06-28 DIAGNOSIS — R680 Hypothermia, not associated with low environmental temperature: Secondary | ICD-10-CM | POA: Diagnosis present

## 2018-06-28 LAB — CBC
HCT: 32.4 % — ABNORMAL LOW (ref 36.0–46.0)
Hemoglobin: 9.4 g/dL — ABNORMAL LOW (ref 12.0–15.0)
MCH: 24.4 pg — ABNORMAL LOW (ref 26.0–34.0)
MCHC: 29 g/dL — ABNORMAL LOW (ref 30.0–36.0)
MCV: 83.9 fL (ref 80.0–100.0)
Platelets: 397 10*3/uL (ref 150–400)
RBC: 3.86 MIL/uL — ABNORMAL LOW (ref 3.87–5.11)
RDW: 17.1 % — ABNORMAL HIGH (ref 11.5–15.5)
WBC: 8.6 10*3/uL (ref 4.0–10.5)
nRBC: 0 % (ref 0.0–0.2)

## 2018-06-28 LAB — CREATININE, SERUM
Creatinine, Ser: 0.69 mg/dL (ref 0.44–1.00)
GFR calc Af Amer: >60 mL/min (ref >60)
GFR calc non Af Amer: >60 mL/min (ref >60)

## 2018-06-28 MED ORDER — CARBIDOPA-LEVODOPA ER 50-200 MG PO TBCR
1.0000 | EXTENDED_RELEASE_TABLET | ORAL | Status: DC
  Administered 2018-06-30: 1 via ORAL
  Filled 2018-06-28 (×3): qty 1

## 2018-06-28 MED ORDER — SODIUM CHLORIDE 0.9 % IV SOLN
3.0000 g | Freq: Four times a day (QID) | INTRAVENOUS | Status: DC
  Administered 2018-06-28 – 2018-06-30 (×10): 3 g via INTRAVENOUS
  Filled 2018-06-28 (×11): qty 3

## 2018-06-28 MED ORDER — KCL IN DEXTROSE-NACL 10-5-0.45 MEQ/L-%-% IV SOLN
INTRAVENOUS | Status: DC
  Administered 2018-06-28 – 2018-06-30 (×4): via INTRAVENOUS
  Filled 2018-06-28 (×6): qty 1000

## 2018-06-28 MED ORDER — BUSPIRONE HCL 5 MG PO TABS
15.0000 mg | ORAL_TABLET | Freq: Two times a day (BID) | ORAL | Status: DC
  Administered 2018-06-28 – 2018-06-29 (×3): 15 mg via ORAL
  Filled 2018-06-28 (×4): qty 1

## 2018-06-28 MED ORDER — RESOURCE THICKENUP CLEAR PO POWD
ORAL | Status: DC | PRN
  Filled 2018-06-28: qty 125

## 2018-06-28 MED ORDER — CARBIDOPA-LEVODOPA ER 36.25-145 MG PO CPCR: 1.0000 | ORAL_CAPSULE | Freq: Three times a day (TID) | ORAL | Status: DC

## 2018-06-28 MED ORDER — CARBIDOPA-LEVODOPA ER 25-100 MG PO TBCR
1.0000 | EXTENDED_RELEASE_TABLET | ORAL | Status: DC
  Administered 2018-06-29 (×2): 1 via ORAL
  Filled 2018-06-28 (×6): qty 1

## 2018-06-28 MED ORDER — SODIUM CHLORIDE 0.9 % IV SOLN
3.0000 g | Freq: Once | INTRAVENOUS | Status: AC
  Administered 2018-06-28: 01:00:00 3 g via INTRAVENOUS
  Filled 2018-06-28: qty 3

## 2018-06-28 MED ORDER — MIRTAZAPINE 15 MG PO TABS
15.0000 mg | ORAL_TABLET | Freq: Every day | ORAL | Status: DC
  Administered 2018-06-29: 15 mg via ORAL
  Filled 2018-06-28: qty 1

## 2018-06-28 MED ORDER — ENOXAPARIN SODIUM 40 MG/0.4ML ~~LOC~~ SOLN
40.0000 mg | SUBCUTANEOUS | Status: DC
  Administered 2018-06-28 – 2018-06-30 (×3): 40 mg via SUBCUTANEOUS
  Filled 2018-06-28 (×3): qty 0.4

## 2018-06-28 MED ORDER — DONEPEZIL HCL 10 MG PO TABS
10.0000 mg | ORAL_TABLET | Freq: Every day | ORAL | Status: DC
  Administered 2018-06-29: 10 mg via ORAL
  Filled 2018-06-28: qty 1

## 2018-06-28 NOTE — Progress Notes (Signed)
PROGRESS NOTE    Susan Simmons  ZOX:[id number] DOB: [birth year] DOA: 06/27/2018 PCP: Clayborn Heron, MD   Brief Narrative:  84 y.o. female with medical history significant for advanced parkinson's dementia who presents with above.  Lives at home with her daughter. Receiving outpatient hospice services from [facility] of [place]. That was started relatively recently.  History obtained form patient's daughter and ED staff as patient is not communicative.  Per daughter's report, patient often chokes when drinking and eating. Had an episode of choking yesterday causing respiratory distress, evaluated in this ED, discharged home with levofloxacin rx. Had another episode today of choking while drinking water, developed worsened respiratory distress. On O2 at home, per report hypoxic with EMS and on a non-rebreather, at the time of my exam breathing comfortably with O2 at 4 l/min and o2 100%. Is on 2 L at home. Per daughter patient is not as alert as she normally is, typically able to make basic needs known, oriented to self.    Assessment & Plan:   Active Problems:   Malnutrition of moderate degree   Parkinson's disease (HCC)   Dementia due to Parkinson's disease without behavioral disturbance (HCC)   Aspiration pneumonia (HCC)   Pressure injury of skin   -Aspiration pneumonia - multiple recent aspiration events in setting of advanced parkinsons. Increased O2 requirement and retrocardiac opacity seen on CXR. May be chemical pneumonitis but given increased o2 demands and findings on imaging and family preference, reasonable to treat with antibiotics. Initially hypothermic to 96.7, resolved to 97.8. - IV unasyn per pharmacy - SLP swallow eval severe aspiration risk rec dysphagia 3 diet. -Consult case management for hospice of [facility] patient is followed by [facility] [place].  This patient needs inpatient hospital stay to continue her IV antibiotics for aspiration pneumonia  patient with hypoxia.  Patient is also with minimal p.o. intake on IV fluids.   Chronic respiratory failure - on 2 L at home - currently on 4 L, wean as tolerated   Decubitus ulcers - multiple, some at least stage 3. Do not appear infected. - wound consult   Parkinson's dementia - continue home buspar, mirtazapine, carbidopa/levodopa  DVT prophylaxis: lovenox Code Status: limited (desires intubation, no cardiac resuscitation)  Family Communication: Daughter Neil Crouch [phone] Disposition Plan: tbd Consults called: none   Pressure Injury 03/02/18 Stage III -  Full thickness tissue loss. Subcutaneous fat may be visible but bone, tendon or muscle are NOT exposed. (Active)  03/02/18 2040  Location: Shoulder  Location Orientation: Left  Staging: Stage III -  Full thickness tissue loss. Subcutaneous fat may be visible but bone, tendon or muscle are NOT exposed.  Wound Description (Comments):   Present on Admission:      Pressure Injury 03/02/18 Stage III -  Full thickness tissue loss. Subcutaneous fat may be visible but bone, tendon or muscle are NOT exposed. (Active)  03/02/18 2045  Location: Sacrum  Location Orientation: Medial  Staging: Stage III -  Full thickness tissue loss. Subcutaneous fat may be visible but bone, tendon or muscle are NOT exposed.  Wound Description (Comments):   Present on Admission:      Pressure Injury 03/02/18 Stage I -  Intact skin with non-blanchable redness of a localized area usually over a bony prominence. (Active)  03/02/18 2047  Location: Heel  Location Orientation: Bilateral  Staging: Stage I -  Intact skin with non-blanchable redness of a localized area usually over a bony prominence.  Wound Description (Comments):  Present on Admission:      Pressure Injury 03/02/18 (Active)  03/02/18 2049  Location: Ear  Location Orientation: Left  Staging:   Wound Description (Comments):   Present on Admission:      Pressure Injury  03/03/18 Stage III -  Full thickness tissue loss. Subcutaneous fat may be visible but bone, tendon or muscle are NOT exposed. (Active)  03/03/18 0055  Location: Ischial tuberosity  Location Orientation: Right  Staging: Stage III -  Full thickness tissue loss. Subcutaneous fat may be visible but bone, tendon or muscle are NOT exposed.  Wound Description (Comments):   Present on Admission:      Pressure Injury 06/28/18 Unstageable - Full thickness tissue loss in which the base of the ulcer is covered by slough (yellow, tan, gray, green or brown) and/or eschar (tan, brown or black) in the wound bed. pressure injury to LEFT upper calf, Eschar (Active)  06/28/18 0230  Location: Leg  Location Orientation: Left;Posterior;Lower  Staging: Unstageable - Full thickness tissue loss in which the base of the ulcer is covered by slough (yellow, tan, gray, green or brown) and/or eschar (tan, brown or black) in the wound bed.  Wound Description (Comments): pressure injury to LEFT upper calf, Eschar  Present on Admission: Yes     Pressure Injury 06/28/18 Unstageable - Full thickness tissue loss in which the base of the ulcer is covered by slough (yellow, tan, gray, green or brown) and/or eschar (tan, brown or black) in the wound bed. Left, posterior mid-calf ulcer covered in esch (Active)  06/28/18 0230  Location: Leg  Location Orientation: Left;Posterior;Lower  Staging: Unstageable - Full thickness tissue loss in which the base of the ulcer is covered by slough (yellow, tan, gray, green or brown) and/or eschar (tan, brown or black) in the wound bed.  Wound Description (Comments): Left, posterior mid-calf ulcer covered in eschar  Present on Admission: Yes     Pressure Injury 06/28/18 Unstageable - Full thickness tissue loss in which the base of the ulcer is covered by slough (yellow, tan, gray, green or brown) and/or eschar (tan, brown or black) in the wound bed. Pressure injury over achilles tendon covered b  (Active)  06/28/18 0230  Location: Leg  Location Orientation: Left;Posterior;Lower  Staging: Unstageable - Full thickness tissue loss in which the base of the ulcer is covered by slough (yellow, tan, gray, green or brown) and/or eschar (tan, brown or black) in the wound bed.  Wound Description (Comments): Pressure injury over achilles tendon covered by eschar  Present on Admission: Yes     Pressure Injury 06/28/18 Unstageable - Full thickness tissue loss in which the base of the ulcer is covered by slough (yellow, tan, gray, green or brown) and/or eschar (tan, brown or black) in the wound bed. Full thickness pressure injury covered by esch (Active)  06/28/18 0230  Location: Heel  Location Orientation: Left  Staging: Unstageable - Full thickness tissue loss in which the base of the ulcer is covered by slough (yellow, tan, gray, green or brown) and/or eschar (tan, brown or black) in the wound bed.  Wound Description (Comments): Full thickness pressure injury covered by eschar LEFT Heel  Present on Admission: Yes     Pressure Injury 06/28/18 Unstageable - Full thickness tissue loss in which the base of the ulcer is covered by slough (yellow, tan, gray, green or brown) and/or eschar (tan, brown or black) in the wound bed. base covered in eschar (Active)  06/28/18 0230  Location: Leg  Location Orientation: Posterior;Lower;Right  Staging: Unstageable - Full thickness tissue loss in which the base of the ulcer is covered by slough (yellow, tan, gray, green or brown) and/or eschar (tan, brown or black) in the wound bed.  Wound Description (Comments): base covered in eschar  Present on Admission: Yes     Pressure Injury 06/28/18 Unstageable - Full thickness tissue loss in which the base of the ulcer is covered by slough (yellow, tan, gray, green or brown) and/or eschar (tan, brown or black) in the wound bed. (Active)  06/28/18 0230  Location: Leg  Location Orientation: Right;Posterior;Lower   Staging: Unstageable - Full thickness tissue loss in which the base of the ulcer is covered by slough (yellow, tan, gray, green or brown) and/or eschar (tan, brown or black) in the wound bed.  Wound Description (Comments):   Present on Admission: Yes     Pressure Injury 06/28/18 Unstageable - Full thickness tissue loss in which the base of the ulcer is covered by slough (yellow, tan, gray, green or brown) and/or eschar (tan, brown or black) in the wound bed. (Active)  06/28/18 0230  Location: Leg  Location Orientation: Right;Posterior;Lower  Staging: Unstageable - Full thickness tissue loss in which the base of the ulcer is covered by slough (yellow, tan, gray, green or brown) and/or eschar (tan, brown or black) in the wound bed.  Wound Description (Comments):   Present on Admission: Yes     Pressure Injury 06/28/18 Unstageable - Full thickness tissue loss in which the base of the ulcer is covered by slough (yellow, tan, gray, green or brown) and/or eschar (tan, brown or black) in the wound bed. RIGHT Heel (Active)  06/28/18 0230  Location: Heel  Location Orientation: Right  Staging: Unstageable - Full thickness tissue loss in which the base of the ulcer is covered by slough (yellow, tan, gray, green or brown) and/or eschar (tan, brown or black) in the wound bed.  Wound Description (Comments): RIGHT Heel  Present on Admission: Yes     Pressure Injury 06/28/18 Unstageable - Full thickness tissue loss in which the base of the ulcer is covered by slough (yellow, tan, gray, green or brown) and/or eschar (tan, brown or black) in the wound bed. (Active)  06/28/18 0230  Location: Sacrum  Location Orientation:   Staging: Unstageable - Full thickness tissue loss in which the base of the ulcer is covered by slough (yellow, tan, gray, green or brown) and/or eschar (tan, brown or black) in the wound bed.  Wound Description (Comments):   Present on Admission: Yes                     Estimated body mass index is 16.14 kg/m as calculated from the following:   Height as of this encounter: 5\' 6"  (1.676 m).   Weight as of this encounter: 45.4 kg.    Subjective: Patient resting in bed does not respond to commands or follow commands  Objective: Vitals:   06/28/18 0045 06/28/18 0145 06/28/18 0200 06/28/18 0245  BP: (!) 108/58 (!) 119/42 (!) 123/44 (!) 110/57  Pulse: 79 77 82 74  Resp: (!) 23 20 20 16   Temp:      TempSrc:      SpO2: 100% 100% 100% 96%  Weight:      Height:        Intake/Output Summary (Last 24 hours) at 06/28/2018 1205 Last data filed at 06/28/2018 [id number] Gross per 24 hour  Intake 680.26 ml  Output -  Net 680.26 ml   Filed Weights   06/28/18 0013  Weight: 45.4 kg    Examination:  General exam: Appears calm and comfortable  Respiratory system: Scattered rhonchi to auscultation. Respiratory effort normal. Cardiovascular system: S1 & S2 heard, RRR. No JVD, murmurs, rubs, gallops or clicks. No pedal edema. Gastrointestinal system: Abdomen is nondistended, soft and nontender. No organomegaly or masses felt. Normal bowel sounds heard. Central nervous system: Alert and oriented. No focal neurological deficits. Extremities: Symmetric 5 x 5 power. Skin: No rashes, lesions or ulcers Psychiatry: Judgement and insight appear normal. Mood & affect appropriate.     Data Reviewed: I have personally reviewed following labs and imaging studies  CBC: Recent Labs  Lab 06/26/18 1217 06/27/18 2004 06/28/18 0541  WBC 9.5 8.7 8.6  NEUTROABS 7.9* 6.2  --   HGB 10.9* 10.6* 9.4*  HCT 37.3 37.2 32.4*  MCV 84.0 83.2 83.9  PLT 462* 495* 397   Basic Metabolic Panel: Recent Labs  Lab 06/26/18 1217 06/27/18 2004 06/28/18 0541  NA 140 138  --   K 4.3 4.0  --   CL 104 103  --   CO2 29 25  --   GLUCOSE 106* 109*  --   BUN 21 31*  --   CREATININE 0.75 0.84 0.69  CALCIUM 8.5* 8.8*  --    GFR: Estimated Creatinine Clearance: 39.5 mL/min (by C-G  formula based on SCr of 0.69 mg/dL). Liver Function Tests: No results for input(s): AST, ALT, ALKPHOS, BILITOT, PROT, ALBUMIN in the last 168 hours. No results for input(s): LIPASE, AMYLASE in the last 168 hours. No results for input(s): AMMONIA in the last 168 hours. Coagulation Profile: No results for input(s): INR, PROTIME in the last 168 hours. Cardiac Enzymes: Recent Labs  Lab 06/27/18 2004  TROPONINI <0.03   BNP (last 3 results) No results for input(s): PROBNP in the last 8760 hours. HbA1C: No results for input(s): HGBA1C in the last 72 hours. CBG: No results for input(s): GLUCAP in the last 168 hours. Lipid Profile: No results for input(s): CHOL, HDL, LDLCALC, TRIG, CHOLHDL, LDLDIRECT in the last 72 hours. Thyroid Function Tests: No results for input(s): TSH, T4TOTAL, FREET4, T3FREE, THYROIDAB in the last 72 hours. Anemia Panel: No results for input(s): VITAMINB12, FOLATE, FERRITIN, TIBC, IRON, RETICCTPCT in the last 72 hours. Sepsis Labs: Recent Labs  Lab 06/26/18 1217  LATICACIDVEN 1.1    Recent Results (from the past 240 hour(s))  SARS Coronavirus 2 (CEPHEID- Performed in [facility] hospital lab), Hosp Order     Status: None   Collection Time: 06/26/18 11:42 AM  Result Value Ref Range Status   SARS Coronavirus 2 NEGATIVE NEGATIVE Final    Comment: (NOTE) If result is NEGATIVE SARS-CoV-2 target nucleic acids are NOT DETECTED. The SARS-CoV-2 RNA is generally detectable in upper and lower  respiratory specimens during the acute phase of infection. The lowest  concentration of SARS-CoV-2 viral copies this assay can detect is 250  copies / mL. A negative result does not preclude SARS-CoV-2 infection  and should not be used as the sole basis for treatment or other  patient management decisions.  A negative result may occur with  improper specimen collection / handling, submission of specimen other  than nasopharyngeal swab, presence of viral mutation(s) within the   areas targeted by this assay, and inadequate number of viral copies  (<250 copies / mL). A negative result must be combined with clinical  observations, patient history, and epidemiological information.  If result is POSITIVE SARS-CoV-2 target nucleic acids are DETECTED. The SARS-CoV-2 RNA is generally detectable in upper and lower  respiratory specimens dur ing the acute phase of infection.  Positive  results are indicative of active infection with SARS-CoV-2.  Clinical  correlation with patient history and other diagnostic information is  necessary to determine patient infection status.  Positive results do  not rule out bacterial infection or co-infection with other viruses. If result is PRESUMPTIVE POSTIVE SARS-CoV-2 nucleic acids MAY BE PRESENT.   A presumptive positive result was obtained on the submitted specimen  and confirmed on repeat testing.  While 2019 novel coronavirus  (SARS-CoV-2) nucleic acids may be present in the submitted sample  additional confirmatory testing may be necessary for epidemiological  and / or clinical management purposes  to differentiate between  SARS-CoV-2 and other Sarbecovirus currently known to infect humans.  If clinically indicated additional testing with an alternate test  methodology [phone]) is advised. The SARS-CoV-2 RNA is generally  detectable in upper and lower respiratory sp ecimens during the acute  phase of infection. The expected result is Negative. Fact Sheet for Patients:  BoilerBrush.com.cy Fact Sheet for Healthcare Providers: https://pope.com/ This test is not yet approved or cleared by the [place] FDA and has been authorized for detection and/or diagnosis of SARS-CoV-2 by FDA under an Emergency Use Authorization (EUA).  This EUA will remain in effect (meaning this test can be used) for the duration of the COVID-19 declaration under Section 564(b)(1) of the Act, 21 U.S.C.  section 360bbb-3(b)(1), unless the authorization is terminated or revoked sooner. Performed at [facility], [address], [place], [place] [address]   Culture, blood (routine x 2)     Status: None (Preliminary result)   Collection Time: 06/26/18 12:17 PM  Result Value Ref Range Status   Specimen Description   Final    BLOOD RIGHT ARM Performed at [facility], [address], [place], [place] [address]    Special Requests   Final    BOTTLES DRAWN AEROBIC AND ANAEROBIC Blood Culture adequate volume   Culture   Final    NO GROWTH < 24 HOURS Performed at [facility], [address] [place], [place] [address]    Report Status PENDING  Incomplete  SARS Coronavirus 2 (CEPHEID- Performed in [facility] hospital lab), Hosp Order     Status: None   Collection Time: 06/27/18  8:04 PM  Result Value Ref Range Status   SARS Coronavirus 2 NEGATIVE NEGATIVE Final    Comment: (NOTE) If result is NEGATIVE SARS-CoV-2 target nucleic acids are NOT DETECTED. The SARS-CoV-2 RNA is generally detectable in upper and lower  respiratory specimens during the acute phase of infection. The lowest  concentration of SARS-CoV-2 viral copies this assay can detect is 250  copies / mL. A negative result does not preclude SARS-CoV-2 infection  and should not be used as the sole basis for treatment or other  patient management decisions.  A negative result may occur with  improper specimen collection / handling, submission of specimen other  than nasopharyngeal swab, presence of viral mutation(s) within the  areas targeted by this assay, and inadequate number of viral copies  (<250 copies / mL). A negative result must be combined with clinical  observations, patient history, and epidemiological information. If result is POSITIVE SARS-CoV-2 target nucleic acids are DETECTED. The SARS-CoV-2 RNA is generally detectable in upper and lower  respiratory specimens dur  ing  the acute phase of infection.  Positive  results are indicative of active infection with SARS-CoV-2.  Clinical  correlation with patient history and other diagnostic information is  necessary to determine patient infection status.  Positive results do  not rule out bacterial infection or co-infection with other viruses. If result is PRESUMPTIVE POSTIVE SARS-CoV-2 nucleic acids MAY BE PRESENT.   A presumptive positive result was obtained on the submitted specimen  and confirmed on repeat testing.  While 2019 novel coronavirus  (SARS-CoV-2) nucleic acids may be present in the submitted sample  additional confirmatory testing may be necessary for epidemiological  and / or clinical management purposes  to differentiate between  SARS-CoV-2 and other Sarbecovirus currently known to infect humans.  If clinically indicated additional testing with an alternate test  methodology [phone]) is advised. The SARS-CoV-2 RNA is generally  detectable in upper and lower respiratory sp ecimens during the acute  phase of infection. The expected result is Negative. Fact Sheet for Patients:  BoilerBrush.com.cy Fact Sheet for Healthcare Providers: https://pope.com/ This test is not yet approved or cleared by the [place] FDA and has been authorized for detection and/or diagnosis of SARS-CoV-2 by FDA under an Emergency Use Authorization (EUA).  This EUA will remain in effect (meaning this test can be used) for the duration of the COVID-19 declaration under Section 564(b)(1) of the Act, 21 U.S.C. section 360bbb-3(b)(1), unless the authorization is terminated or revoked sooner. Performed at [facility], [address] [place], [place] [address]          Radiology Studies: Dg Chest Port 1 View  Result Date: 06/27/2018 CLINICAL DATA:  Shortness of breath and confusion. EXAM: PORTABLE CHEST 1 VIEW COMPARISON:  06/26/2018  FINDINGS: There is a retrocardiac opacity. There is some elevation of the left hemidiaphragm. There is no pneumothorax. The lungs are somewhat hyperexpanded. There is no acute osseous abnormality. IMPRESSION: New retrocardiac opacity concerning for developing pneumonia or aspiration. A small underlying pleural effusion is not excluded. Electronically Signed   By: Katherine Mantle M.D.   On: 06/27/2018 20:03        Scheduled Meds: . busPIRone  15 mg Oral BID  . carbidopa-levodopa  1 tablet Oral Q24H   And  . Carbidopa-Levodopa ER  1 tablet Oral 2 times per day  . donepezil  10 mg Oral QHS  . enoxaparin (LOVENOX) injection  40 mg Subcutaneous Q24H  . mirtazapine  15 mg Oral QHS   Continuous Infusions: . ampicillin-sulbactam (UNASYN) IV Stopped (06/28/18 0544)  . dextrose 5 % and 0.45 % NaCl with KCl 10 mEq/L 100 mL/hr at 06/28/18 0700     LOS: 0 days     Alwyn Ren, MD [facility]  If 7PM-7AM, please contact night-coverage [email or web address] Password [facility] 06/28/2018, 12:05 PM

## 2018-06-28 NOTE — Progress Notes (Signed)
RN tried to get pt to take evening meds. RN stated pt's name and pt turned head toward sound but did not open eyes. When prompted to eat a plain spoonful of applesauce, pt began chewing on the spoon. Not responsive enough to take PO meds this evening, will try again in the AM. Day shift RN reported similar event earlier when trying to give crushed meds with applesauce.

## 2018-06-28 NOTE — Progress Notes (Signed)
Pharmacy Antibiotic Note  Susan Simmons is a 84 y.o. female admitted on 06/27/2018 with pneumonia.  Pharmacy has been consulted for Unasyn dosing.  Plan: Unasyn 3gm iv q6hr  Height: 5\' 6"  (167.6 cm) Weight: 100 lb (45.4 kg) IBW/kg (Calculated) : 59.3  Temp (24hrs), Avg:97.3 F (36.3 C), Min:96.7 F (35.9 C), Max:97.8 F (36.6 C)  Recent Labs  Lab 06/26/18 1217 06/27/18 2004  WBC 9.5 8.7  CREATININE 0.75 0.84  LATICACIDVEN 1.1  --     Estimated Creatinine Clearance: 37.6 mL/min (by C-G formula based on SCr of 0.84 mg/dL).    No Known Allergies  Antimicrobials this admission: Unasyn 06/28/2018 >>  Dose adjustments this admission: -  Microbiology results: -  Thank you for allowing pharmacy to be a part of this patient's care.  Aleene Davidson Crowford 06/28/2018 5:03 AM

## 2018-06-28 NOTE — Plan of Care (Signed)
The patient has cognitive limitations that impair her ability to understand and retain new information.

## 2018-06-28 NOTE — Progress Notes (Signed)
[facility] GIP RN Note.  This is a related and covered GIP admission of 06/28/2018 with a ACC diagnosis of Parkinson's disease. Patient has an OOF DNR in the home. Family activated EMS after the patient started experiencing increased SOB and altered mental status. Patient was admitted for diagnosis of aspiration pneumonia.   Daughter called EMS after the patient started experiencing some choking and SOB. Her O2 % was dropping after EMS put her on Oxygen. Patient was transported to [facility] ED and admitted.   VS: 98.3, 108/43, 61, 20, 100% on 3L. Abnormal labs: RBC 3.86, Hgb 9.4, HCT 32.4, MCH 24.4, MCHC 29.0, RDW 17.1, Glucose 109, Calcium 8.8 Imaging:  FINDINGS: There is a retrocardiac opacity. There is some elevation of the left hemidiaphragm. There is no pneumothorax. The lungs are somewhat hyperexpanded. There is no acute osseous abnormality.  IMPRESSION: New retrocardiac opacity concerning for developing pneumonia or aspiration. A small underlying pleural effusion is not excluded.  Medications: UNASYN 3g IVPB QID, D51/5NS with KCL64meq @ 131ml/hr No PRNs given.   Per MD notes: Active Problems:   Malnutrition of moderate degree   Parkinson's disease (HCC)   Dementia due to Parkinson's disease without behavioral disturbance (HCC)   Aspiration pneumonia (HCC)   Pressure injury of skin  -Aspiration pneumonia - multiple recent aspiration events in setting of advanced parkinsons. Increased O2 requirement and retrocardiac opacity seen on CXR. May be chemical pneumonitis but given increased o2 demands and findings on imaging and family preference, reasonable to treat with antibiotics. Initially hypothermic to 96.7, resolved to 97.8. - IV unasyn per pharmacy - SLP swallow eval severe aspiration risk rec dysphagia 3 diet. -Consult case management for hospice of [facility] patient is followed by hospice of [place].  This patient needs inpatient hospital stay to continue her IV  antibiotics for aspiration pneumonia patient with hypoxia.  Patient is also with minimal p.o. intake on IV fluids.   Chronic respiratory failure - on 2 L at home - currently on 4 L, wean as tolerated   Decubitus ulcers - multiple, some at least stage 3. Do not appear infected. - wound consult   Parkinson's dementia - continue home buspar, mirtazapine, carbidopa/levodopa  DVT prophylaxis:Lovenox  Also per MD notes: Code Status:limited (desires intubation, no cardiac resuscitation) This patient had a OOF DNR at home.   GOC: as discussed with daughter. Considering [employer].   Communication with [facility]: Spoke with daughter on phone. Daughter wants her mother to receive antibiotics for a few days to see if there is improvement. We discussed the option of transferring to [facility] if patient does not improve or continues to decline. Daughter is agreement to this plan.   Communication with [facility]G: Team made aware of admission and updated via EMAIL.   Transfer Summary and Medication List faxed to unit to place on shadow chart.?  If ambulance transport is needed, please use [facility] for all active ACC patients requiring ambulance transport.   Please call with any hospice related questions.  Thank you, Elsie Saas, RN, [facility] Liaison [phone]  [facility] liaisons are listed under [facility] of [place] on [address]

## 2018-06-28 NOTE — Evaluation (Signed)
Clinical/Bedside Swallow Evaluation Patient Details  Name: Susan Simmons MRN: [id number] Date of Birth: [birth year]  Today's Date: 06/28/2018 Time: SLP Start Time (ACUTE ONLY): 0920 SLP Stop Time (ACUTE ONLY): 0937 SLP Time Calculation (min) (ACUTE ONLY): 17 min  Past Medical History:  Past Medical History:  Diagnosis Date  . Anxiety   . Depression   . Heart murmur   . Parkinson disease (HCC)   . Tooth infection 2019   Past Surgical History:  Past Surgical History:  Procedure Laterality Date  . denies surgical history     HPI:   Susan Simmons is a 84 y.o. female with medical history significant for advanced Parkinson's dementia who presents with aspiration event. Per daughter's report, patient often chokes when drinking and eating. Had an episode of choking yesterday causing respiratory distress, evaluated in this ED, discharged home with levofloxacin rx. Had another episode of choking while drinking water, developed worsened respiratory distress. Pt has an OP MBS scheduled for 08/05/2018. Aspiration pneumonia - multiple recent aspiration events in setting of advanced pneumonia. Increased O2 requirement and retrocardiac opacity seen on CXR. Per MD, may be chemical pneumonitis but given increased o2 demands and findings on imaging and family preference, reasonable to treat with antibiotics.Per MD pt needs ongoing goals of care discussion w/ daughter as disconnect between hospice status and family's relatively aggressive treatment approach. Pt had a choking episode with spaghetti in 2019, clinical eval in 02/2018 showed no signs of aspiration, but did have some cognitive based oral dysphagia.    Assessment / Plan / Recommendation Clinical Impression  Pt demonstrates impaired cognition and decreased arousal this am, likely more impaired than her baseline with eyes closed but responsive to tactile cues. Total assist feeding required. When offered sips of thin liquids there is immediate  coughing after the swallow suggesting sensed aspiration, which is eliminated if given nectar thick liquids with a straw. Pt will accept 1/2 teaspoons of puree; minimally opens lips for spoon, but no difficulty with texture. Very readily accepts and masticates solids with automatic, appropriate function. Recommend initiating a mech soft diet and nectar thick liquids for now. Will f/u for tolerance and need for instrumental testing; pt is scheduled for an OP MBS on 6/30 which may or may not be appropriate during this admission.  SLP Visit Diagnosis: Dysphagia, oropharyngeal phase (R13.12)    Aspiration Risk  Severe aspiration risk    Diet Recommendation Dysphagia 3 (Mech soft);Nectar-thick liquid   Liquid Administration via: Cup;Straw Medication Administration: Crushed with puree Supervision: Staff to assist with self feeding;Full supervision/cueing for compensatory strategies Compensations: Slow rate;Small sips/bites Postural Changes: Seated upright at 90 degrees    Other  Recommendations Oral Care Recommendations: Oral care BID Other Recommendations: Have oral suction available   Follow up Recommendations 24 hour supervision/assistance      Frequency and Duration min 2x/week  2 weeks       Prognosis Prognosis for Safe Diet Advancement: Fair Barriers to Reach Goals: Cognitive deficits      Swallow Study   General HPI:  Susan Simmons is a 84 y.o. female with medical history significant for advanced Parkinson's dementia who presents with aspiration event. Per daughter's report, patient often chokes when drinking and eating. Had an episode of choking yesterday causing respiratory distress, evaluated in this ED, discharged home with levofloxacin rx. Had another episode of choking while drinking water, developed worsened respiratory distress. Pt has an OP MBS scheduled for 08/05/2018. Aspiration pneumonia - multiple recent aspiration events in setting of  advanced pneumonia. Increased O2  requirement and retrocardiac opacity seen on CXR. Per MD, may be chemical pneumonitis but given increased o2 demands and findings on imaging and family preference, reasonable to treat with antibiotics.Per MD pt needs ongoing goals of care discussion w/ daughter as disconnect between hospice status and family's relatively aggressive treatment approach. Pt had a choking episode with spaghetti in 2019, clinical eval in 02/2018 showed no signs of aspiration, but did have some cognitive based oral dysphagia.  Type of Study: Bedside Swallow Evaluation Previous Swallow Assessment: see HPI Diet Prior to this Study: NPO Temperature Spikes Noted: No Respiratory Status: Room air History of Recent Intubation: No Behavior/Cognition: Requires cueing;Lethargic/Drowsy Oral Care Completed by SLP: No Oral Cavity - Dentition: Adequate natural dentition Self-Feeding Abilities: Total assist Patient Positioning: Upright in bed Baseline Vocal Quality: Not observed Volitional Cough: Cognitively unable to elicit Volitional Swallow: Unable to elicit    Oral/Motor/Sensory Function Overall Oral Motor/Sensory Function: Other (comment)(unable to follow commands)   Ice Chips Ice chips: Not tested   Thin Liquid Thin Liquid: Impaired Presentation: Straw;Cup Oral Phase Impairments: Poor awareness of bolus Pharyngeal  Phase Impairments: Multiple swallows;Cough - Immediate    Nectar Thick Nectar Thick Liquid: Within functional limits Presentation: Straw   Honey Thick Honey Thick Liquid: Not tested   Puree Puree: Impaired Presentation: Spoon Oral Phase Impairments: Poor awareness of bolus Oral Phase Functional Implications: Other (comment)(open mouth minimally for spoon)   Solid     Solid: Within functional limits     Harlon Ditty, MA CCC-SLP  Acute Rehabilitation Services Pager [phone] Office [phone]  Claudine Mouton 06/28/2018,9:43 AM

## 2018-06-28 NOTE — Progress Notes (Signed)
Pt has been lethargic since admission. Will sit up and "slurp" nectar thick food/drink from spoon. Very little consumed today. Responds to pain. Barely opens eyes for a second only. MD aware. Close monitoring continues.

## 2018-06-28 NOTE — TOC Initial Note (Addendum)
Transition of Care [facility]) - Initial/Assessment Note    Patient Details  Name: Susan Simmons MRN: [id number] Date of Birth: 1934-08-05  Transition of Care [facility]) CM/SW Contact:    Bartholome Bill, RN Phone Number: 06/28/2018, 2:18 PM  Clinical Narrative:                  This CM left voicemail for [facility] liaison Chales Abrahams, to alert her that pt in the hospital. Per MD pt is active with hospice at home.       Patient Goals and CMS Choice        Expected Discharge Plan and Services                                                Prior Living Arrangements/Services                       Activities of Daily Living Home Assistive Devices/Equipment: None ADL Screening (condition at time of admission) Patient's cognitive ability adequate to safely complete daily activities?: No Is the patient deaf or have difficulty hearing?: No Does the patient have difficulty seeing, even when wearing glasses/contacts?: No Does the patient have difficulty concentrating, remembering, or making decisions?: Yes Patient able to express need for assistance with ADLs?: No Does the patient have difficulty dressing or bathing?: Yes Independently performs ADLs?: No Communication: Dependent Is this a change from baseline?: Pre-admission baseline Dressing (OT): Dependent Is this a change from baseline?: Pre-admission baseline Grooming: Dependent Is this a change from baseline?: Pre-admission baseline Feeding: Dependent Is this a change from baseline?: Pre-admission baseline Bathing: Dependent Is this a change from baseline?: Pre-admission baseline Toileting: Dependent Is this a change from baseline?: Pre-admission baseline In/Out Bed: Dependent Is this a change from baseline?: Pre-admission baseline Does the patient have difficulty walking or climbing stairs?: Yes(unable) Weakness of Legs: Both Weakness of Arms/Hands: Both  Permission Sought/Granted                   Emotional Assessment              Admission diagnosis:  Community acquired pneumonia, unspecified laterality [J18.9] Patient Active Problem List   Diagnosis Date Noted  . Pressure injury of skin 06/28/2018  . Community acquired pneumonia   . Aspiration pneumonia (HCC) 06/27/2018  . Dysphagia 03/02/2018  . Mediastinal mass 03/02/2018  . Fall 09/25/2016  . Sacral ulcer, with unspecified severity (HCC) 09/25/2016  . Suspected deep tissue injury of unknown depth of heel 09/25/2016  . Dementia due to Parkinson's disease without behavioral disturbance (HCC) 12/19/2015  . Psychosis (HCC) 12/19/2015  . AKI (acute kidney injury) (HCC) 11/22/2015  . Dehydration 11/22/2015  . Parkinson's disease (HCC) 01/25/2015  . Abnormality of gait   . Leukopenia 01/07/2015  . Anxiety 11/12/2014  . Malnutrition of moderate degree 11/10/2014  . Pressure ulcer, stage 3 (HCC) 11/09/2014  . Acute encephalopathy 08/02/2014  . Depression 05/02/2014   PCP:  Clayborn Heron, MD Pharmacy:   [facility] [address], [place] - [address] [place] [address] Phone: [phone] Fax: [phone]     Social Determinants of Health (SDOH) Interventions    Readmission Risk Interventions No flowsheet data found.  Sandford Craze RN,BSN [phone]

## 2018-06-29 DIAGNOSIS — F0280 Dementia in other diseases classified elsewhere without behavioral disturbance: Secondary | ICD-10-CM

## 2018-06-29 DIAGNOSIS — G20 Parkinson's disease: Secondary | ICD-10-CM

## 2018-06-29 NOTE — Progress Notes (Signed)
[facility] GIP RN Note.  This is a related and covered GIP admission of 06/28/2018 with a ACC diagnosis of Parkinson's disease. Patient has an OOF DNR in the home. Family activated EMS after the patient started experiencing increased SOB and altered mental status. Patient was admitted for diagnosis of aspiration pneumonia.   Daughter called EMS after the patient started experiencing some choking and SOB. Her O2 % was dropping after EMS put her on Oxygen. Patient was transported to [facility] ED and admitted.   VS: 98.0, 119/60, 62, 18, 100% on RA I/O: 1210/400 No new labs.  Medications: UNASYN 3g IVPB QID, D51/5NS with KCL20meq @ 152ml/hr No PRNs given.  MD notes: Active Problems:   Malnutrition of moderate degree   Parkinson's disease (HCC)   Dementia due to Parkinson's disease without behavioral disturbance (HCC)   Aspiration pneumonia (HCC)   Pressure injury of skin   Community acquired pneumonia  -Aspiration pneumonia - multiple recent aspiration events in setting of advanced parkinsons.Increased O2 requirement and retrocardiac opacity seen on CXR.  Patient is 100% on room air continue IV Unasyn.  Discussed with daughter who is battling her mind as to whether to go [facility] or take her home she does not want her mother to suffer when she gets hypoxic.  She reported that she did not get a call back from the hospice in 40 minutes so she called 911.  She asked me the options I gave her the options available which is pressure feeding versus PEG tube feeding but I strongly recommended against PEG tube feeding as it will not prevent aspiration or improve her quality of life.  Patient already has multiple wounds.  Patient has been seen by hospice will follow tomorrow.  Chronic respiratory failure - on 2 L at home only on room air saturation 100%.  Decubitus ulcers - multiple, some at least stage 3. Do not appear infected. - wound consult pending.  Parkinson's dementia -  continue home buspar, mirtazapine, carbidopa/levodopa  DVT prophylaxis:lovenox Code Status:limited (desires intubation, no cardiac resuscitation)  GOC: as discussed with daughter. Considering [employer].   Communication with [facility]: Spoke with daughter, [name] on phone. She spoke with MD earlier and for now, wants her mother to receive antibiotics for one more day. She is open to transferring to [facility] if the medications are not benefiting or improving her mother's condition. She verbalizes concern that she is doing the "right thing". Emotional support given.    Communication with [facility]: Team made aware of admission and updated via EMAIL.   Transfer Summary and Medication List faxed to unit to place on shadow chart.?  If ambulance transport is needed, please use [facility] for all active ACC patients requiring ambulance transport.   Please call with any hospice related questions.  Thank you, Elsie Saas, RN, [facility] Liaison [phone]  [facility] liaisons are listed under Hospice and Palliative Care of [place] on [place]

## 2018-06-29 NOTE — Progress Notes (Signed)
PROGRESS NOTE    Susan Simmons  WUJ:[id number] DOB: 09/15/1934 DOA: 06/27/2018 PCP: Clayborn Heron, MD   Brief Narrative:84 y.o.femalewith medical history significant foradvanced parkinson's dementia who presents with above.  Lives at home with her daughter. Receiving outpatient hospice services from [facility]. That was started relatively recently.  History obtained form patient's daughter and ED staff as patient is not communicative.  Per daughter's report, patient often chokes when drinking and eating. Had an episode of choking yesterday causing respiratory distress, evaluated in this ED, discharged home with levofloxacin rx. Had another episode today of choking while drinking water, developed worsened respiratory distress. On O2 at home, per report hypoxic with EMS and on a non-rebreather, at the time of my exam breathing comfortably with O2 at 4 l/min and o2 100%. Is on 2 L at home. Per daughter patient is not as alert as she normally is, typically able to make basic needs known, oriented to self.   Assessment & Plan:   Active Problems:   Malnutrition of moderate degree   Parkinson's disease (HCC)   Dementia due to Parkinson's disease without behavioral disturbance (HCC)   Aspiration pneumonia (HCC)   Pressure injury of skin   Community acquired pneumonia  -Aspiration pneumonia - multiple recent aspiration events in setting of advanced parkinsons. Increased O2 requirement and retrocardiac opacity seen on CXR.  Patient is 100% on room air continue IV Unasyn.  Discussed with daughter who is battling her mind as to whether to go [facility] or take her home she does not want her mother to suffer when she gets hypoxic.  She reported that she did not get a call back from the hospice in 40 minutes so she called 911.  She asked me the options I gave her the options available which is pressure feeding versus PEG tube feeding but I strongly recommended against PEG tube  feeding as it will not prevent aspiration or improve her quality of life.  Patient already has multiple wounds.  Patient has been seen by hospice will follow tomorrow.   Chronic respiratory failure - on 2 L at home only on room air saturation 100%.   Decubitus ulcers - multiple, some at least stage 3. Do not appear infected. - wound consult pending.   Parkinson's dementia - continue home buspar, mirtazapine, carbidopa/levodopa  DVT prophylaxis:lovenox Code Status:limited (desires intubation, no cardiac resuscitation) Family Communication:Daughter Neil Crouch [phone] Disposition Plan:tbd Consults called:none   Pressure Injury 03/02/18 Stage III -  Full thickness tissue loss. Subcutaneous fat may be visible but bone, tendon or muscle are NOT exposed. (Active)  03/02/18 2040  Location: Shoulder  Location Orientation: Left  Staging: Stage III -  Full thickness tissue loss. Subcutaneous fat may be visible but bone, tendon or muscle are NOT exposed.  Wound Description (Comments):   Present on Admission:      Pressure Injury 03/02/18 Stage III -  Full thickness tissue loss. Subcutaneous fat may be visible but bone, tendon or muscle are NOT exposed. (Active)  03/02/18 2045  Location: Sacrum  Location Orientation: Medial  Staging: Stage III -  Full thickness tissue loss. Subcutaneous fat may be visible but bone, tendon or muscle are NOT exposed.  Wound Description (Comments):   Present on Admission:      Pressure Injury 03/02/18 Stage I -  Intact skin with non-blanchable redness of a localized area usually over a bony prominence. (Active)  03/02/18 2047  Location: Heel  Location Orientation: Bilateral  Staging: Stage  I -  Intact skin with non-blanchable redness of a localized area usually over a bony prominence.  Wound Description (Comments):   Present on Admission:      Pressure Injury 03/02/18 (Active)  03/02/18 2049  Location: Ear  Location Orientation: Left    Staging:   Wound Description (Comments):   Present on Admission:      Pressure Injury 03/03/18 Stage III -  Full thickness tissue loss. Subcutaneous fat may be visible but bone, tendon or muscle are NOT exposed. (Active)  03/03/18 0055  Location: Ischial tuberosity  Location Orientation: Right  Staging: Stage III -  Full thickness tissue loss. Subcutaneous fat may be visible but bone, tendon or muscle are NOT exposed.  Wound Description (Comments):   Present on Admission:      Pressure Injury 06/28/18 Unstageable - Full thickness tissue loss in which the base of the ulcer is covered by slough (yellow, tan, gray, green or brown) and/or eschar (tan, brown or black) in the wound bed. pressure injury to LEFT upper calf, Eschar (Active)  06/28/18 0230  Location: Leg  Location Orientation: Left;Posterior;Lower  Staging: Unstageable - Full thickness tissue loss in which the base of the ulcer is covered by slough (yellow, tan, gray, green or brown) and/or eschar (tan, brown or black) in the wound bed.  Wound Description (Comments): pressure injury to LEFT upper calf, Eschar  Present on Admission: Yes     Pressure Injury 06/28/18 Unstageable - Full thickness tissue loss in which the base of the ulcer is covered by slough (yellow, tan, gray, green or brown) and/or eschar (tan, brown or black) in the wound bed. Left, posterior mid-calf ulcer covered in esch (Active)  06/28/18 0230  Location: Leg  Location Orientation: Left;Posterior;Lower  Staging: Unstageable - Full thickness tissue loss in which the base of the ulcer is covered by slough (yellow, tan, gray, green or brown) and/or eschar (tan, brown or black) in the wound bed.  Wound Description (Comments): Left, posterior mid-calf ulcer covered in eschar  Present on Admission: Yes     Pressure Injury 06/28/18 Unstageable - Full thickness tissue loss in which the base of the ulcer is covered by slough (yellow, tan, gray, green or brown) and/or  eschar (tan, brown or black) in the wound bed. Pressure injury over achilles tendon covered b (Active)  06/28/18 0230  Location: Leg  Location Orientation: Left;Posterior;Lower  Staging: Unstageable - Full thickness tissue loss in which the base of the ulcer is covered by slough (yellow, tan, gray, green or brown) and/or eschar (tan, brown or black) in the wound bed.  Wound Description (Comments): Pressure injury over achilles tendon covered by eschar  Present on Admission: Yes     Pressure Injury 06/28/18 Unstageable - Full thickness tissue loss in which the base of the ulcer is covered by slough (yellow, tan, gray, green or brown) and/or eschar (tan, brown or black) in the wound bed. Full thickness pressure injury covered by esch (Active)  06/28/18 0230  Location: Heel  Location Orientation: Left  Staging: Unstageable - Full thickness tissue loss in which the base of the ulcer is covered by slough (yellow, tan, gray, green or brown) and/or eschar (tan, brown or black) in the wound bed.  Wound Description (Comments): Full thickness pressure injury covered by eschar LEFT Heel  Present on Admission: Yes     Pressure Injury 06/28/18 Unstageable - Full thickness tissue loss in which the base of the ulcer is covered by slough (yellow, tan, gray,  green or brown) and/or eschar (tan, brown or black) in the wound bed. base covered in eschar (Active)  06/28/18 0230  Location: Leg  Location Orientation: Posterior;Lower;Right  Staging: Unstageable - Full thickness tissue loss in which the base of the ulcer is covered by slough (yellow, tan, gray, green or brown) and/or eschar (tan, brown or black) in the wound bed.  Wound Description (Comments): base covered in eschar  Present on Admission: Yes     Pressure Injury 06/28/18 Unstageable - Full thickness tissue loss in which the base of the ulcer is covered by slough (yellow, tan, gray, green or brown) and/or eschar (tan, brown or black) in the wound bed.  (Active)  06/28/18 0230  Location: Leg  Location Orientation: Right;Posterior;Lower  Staging: Unstageable - Full thickness tissue loss in which the base of the ulcer is covered by slough (yellow, tan, gray, green or brown) and/or eschar (tan, brown or black) in the wound bed.  Wound Description (Comments):   Present on Admission: Yes     Pressure Injury 06/28/18 Unstageable - Full thickness tissue loss in which the base of the ulcer is covered by slough (yellow, tan, gray, green or brown) and/or eschar (tan, brown or black) in the wound bed. (Active)  06/28/18 0230  Location: Leg  Location Orientation: Right;Posterior;Lower  Staging: Unstageable - Full thickness tissue loss in which the base of the ulcer is covered by slough (yellow, tan, gray, green or brown) and/or eschar (tan, brown or black) in the wound bed.  Wound Description (Comments):   Present on Admission: Yes     Pressure Injury 06/28/18 Unstageable - Full thickness tissue loss in which the base of the ulcer is covered by slough (yellow, tan, gray, green or brown) and/or eschar (tan, brown or black) in the wound bed. RIGHT Heel (Active)  06/28/18 0230  Location: Heel  Location Orientation: Right  Staging: Unstageable - Full thickness tissue loss in which the base of the ulcer is covered by slough (yellow, tan, gray, green or brown) and/or eschar (tan, brown or black) in the wound bed.  Wound Description (Comments): RIGHT Heel  Present on Admission: Yes     Pressure Injury 06/28/18 Unstageable - Full thickness tissue loss in which the base of the ulcer is covered by slough (yellow, tan, gray, green or brown) and/or eschar (tan, brown or black) in the wound bed. (Active)  06/28/18 0230  Location: Sacrum  Location Orientation:   Staging: Unstageable - Full thickness tissue loss in which the base of the ulcer is covered by slough (yellow, tan, gray, green or brown) and/or eschar (tan, brown or black) in the wound bed.  Wound  Description (Comments):   Present on Admission: Yes    Estimated body mass index is 16.14 kg/m as calculated from the following:   Height as of this encounter:  (1.676 m).   Weight as of this encounter: 45.4 kg.   Subjective: Patient resting in bed keeps her eyes closed appears in no distress does not respond to commands RN reported she woke up later and ate some breakfast  Objective: Vitals:   06/28/18 0245 06/28/18 1435 06/28/18 2034 06/29/18 0420  BP: (!) 110/57 (!) 108/43 117/62 119/60  Pulse: 74 67 68 62  Resp: Temp:  98.3 F (36.8 C) 98.2 F (36.8 C) 98 F (36.7 C)  TempSrc:  Oral Oral Oral  SpO2: 96%  98% 100%  Weight:      Height:  Intake/Output Summary (Last 24 hours) at 06/29/2018 1133 Last data filed at 06/29/2018 0900 Gross per 24 hour  Intake 1330 ml  Output 400 ml  Net 930 ml   Filed Weights   06/28/18 0013  Weight: 45.4 kg    Examination:  General exam: Appears calm and comfortable  Respiratory system: Clear to auscultation. Respiratory effort normal. Cardiovascular system: S1 & S2 heard, RRR. No JVD, murmurs, rubs, gallops or clicks. No pedal edema. Gastrointestinal system: Abdomen is nondistended, soft and nontender. No organomegaly or masses felt. Normal bowel sounds heard. Central nervous system: Alert and oriented. No focal neurological deficits. Extremities: Right upper extremity edema noted trace bilateral pitting edema noted  skin: No rashes, lesions or ulcers Psychiatry: Judgement and insight appear normal. Mood & affect appropriate.     Data Reviewed: I have personally reviewed following labs and imaging studies  CBC: Recent Labs  Lab 06/26/18 1217 06/27/18 2004 06/28/18 0541  WBC 9.5 8.7 8.6  NEUTROABS 7.9* 6.2  --   HGB 10.9* 10.6* 9.4*  HCT 37.3 37.2 32.4*  MCV 84.0 83.2 83.9  PLT 462* 495* 397   Basic Metabolic Panel: Recent Labs  Lab 06/26/18 1217 06/27/18 2004 06/28/18 0541  NA 140 138  --    K 4.3 4.0  --   CL 104 103  --   CO2 29 25  --   GLUCOSE 106* 109*  --   BUN 21 31*  --   CREATININE 0.75 0.84 0.69  CALCIUM 8.5* 8.8*  --    GFR: Estimated Creatinine Clearance: 39.5 mL/min (by C-G formula based on SCr of 0.69 mg/dL). Liver Function Tests: No results for input(s): AST, ALT, ALKPHOS, BILITOT, PROT, ALBUMIN in the last 168 hours. No results for input(s): LIPASE, AMYLASE in the last 168 hours. No results for input(s): AMMONIA in the last 168 hours. Coagulation Profile: No results for input(s): INR, PROTIME in the last 168 hours. Cardiac Enzymes: Recent Labs  Lab 06/27/18 2004  TROPONINI <0.03   BNP (last 3 results) No results for input(s): PROBNP in the last 8760 hours. HbA1C: No results for input(s): HGBA1C in the last 72 hours. CBG: No results for input(s): GLUCAP in the last 168 hours. Lipid Profile: No results for input(s): CHOL, HDL, LDLCALC, TRIG, CHOLHDL, LDLDIRECT in the last 72 hours. Thyroid Function Tests: No results for input(s): TSH, T4TOTAL, FREET4, T3FREE, THYROIDAB in the last 72 hours. Anemia Panel: No results for input(s): VITAMINB12, FOLATE, FERRITIN, TIBC, IRON, RETICCTPCT in the last 72 hours. Sepsis Labs: Recent Labs  Lab 06/26/18 1217  LATICACIDVEN 1.1    Recent Results (from the past 240 hour(s))  SARS Coronavirus 2 (CEPHEID- Performed in [facility] hospital lab), Hosp Order     Status: None   Collection Time: 06/26/18 11:42 AM  Result Value Ref Range Status   SARS Coronavirus 2 NEGATIVE NEGATIVE Final    Comment: (NOTE) If result is NEGATIVE SARS-CoV-2 target nucleic acids are NOT DETECTED. The SARS-CoV-2 RNA is generally detectable in upper and lower  respiratory specimens during the acute phase of infection. The lowest  concentration of SARS-CoV-2 viral copies this assay can detect is 250  copies / mL. A negative result does not preclude SARS-CoV-2 infection  and should not be used as the sole basis for treatment or  other  patient management decisions.  A negative result may occur with  improper specimen collection / handling, submission of specimen other  than nasopharyngeal swab, presence of viral mutation(s) within the  areas targeted by this assay, and inadequate number of viral copies  (<250 copies / mL). A negative result must be combined with clinical  observations, patient history, and epidemiological information. If result is POSITIVE SARS-CoV-2 target nucleic acids are DETECTED. The SARS-CoV-2 RNA is generally detectable in upper and lower  respiratory specimens dur ing the acute phase of infection.  Positive  results are indicative of active infection with SARS-CoV-2.  Clinical  correlation with patient history and other diagnostic information is  necessary to determine patient infection status.  Positive results do  not rule out bacterial infection or co-infection with other viruses. If result is PRESUMPTIVE POSTIVE SARS-CoV-2 nucleic acids MAY BE PRESENT.   A presumptive positive result was obtained on the submitted specimen  and confirmed on repeat testing.  While 2019 novel coronavirus  (SARS-CoV-2) nucleic acids may be present in the submitted sample  additional confirmatory testing may be necessary for epidemiological  and / or clinical management purposes  to differentiate between  SARS-CoV-2 and other Sarbecovirus currently known to infect humans.  If clinically indicated additional testing with an alternate test  methodology [phone]) is advised. The SARS-CoV-2 RNA is generally  detectable in upper and lower respiratory sp ecimens during the acute  phase of infection. The expected result is Negative. Fact Sheet for Patients:  BoilerBrush.com.cy Fact Sheet for Healthcare Providers: https://pope.com/ This test is not yet approved or cleared by the [place] FDA and has been authorized for detection and/or diagnosis of  SARS-CoV-2 by FDA under an Emergency Use Authorization (EUA).  This EUA will remain in effect (meaning this test can be used) for the duration of the COVID-19 declaration under Section 564(b)(1) of the Act, 21 U.S.C. section 360bbb-3(b)(1), unless the authorization is terminated or revoked sooner. Performed at [facility], [address], [place], [place] [address]   Culture, blood (routine x 2)     Status: None (Preliminary result)   Collection Time: 06/26/18 12:17 PM  Result Value Ref Range Status   Specimen Description   Final    BLOOD RIGHT ARM Performed at [facility], [address], [place], [place] [address]    Special Requests   Final    BOTTLES DRAWN AEROBIC AND ANAEROBIC Blood Culture adequate volume   Culture   Final    NO GROWTH 3 DAYS Performed at [facility], [address], [place], [place] [address]    Report Status PENDING  Incomplete  SARS Coronavirus 2 (CEPHEID- Performed in [facility] hospital lab), Hosp Order     Status: None   Collection Time: 06/27/18  8:04 PM  Result Value Ref Range Status   SARS Coronavirus 2 NEGATIVE NEGATIVE Final    Comment: (NOTE) If result is NEGATIVE SARS-CoV-2 target nucleic acids are NOT DETECTED. The SARS-CoV-2 RNA is generally detectable in upper and lower  respiratory specimens during the acute phase of infection. The lowest  concentration of SARS-CoV-2 viral copies this assay can detect is 250  copies / mL. A negative result does not preclude SARS-CoV-2 infection  and should not be used as the sole basis for treatment or other  patient management decisions.  A negative result may occur with  improper specimen collection / handling, submission of specimen other  than nasopharyngeal swab, presence of viral mutation(s) within the  areas targeted by this assay, and inadequate number of viral copies  (<250 copies / mL). A negative result must be combined with clinical  observations, patient history, and epidemiological information. If result is POSITIVE SARS-CoV-2 target nucleic acids are DETECTED. The SARS-CoV-2 RNA is generally detectable in upper and lower  respiratory specimens dur ing the acute phase of infection.  Positive  results are indicative of active infection with SARS-CoV-2.  Clinical  correlation with patient history and other diagnostic information is  necessary to determine patient infection status.  Positive results do  not rule out bacterial infection or co-infection with other viruses. If result is PRESUMPTIVE POSTIVE SARS-CoV-2 nucleic acids MAY BE PRESENT.   A presumptive positive result was obtained on the submitted specimen  and confirmed on repeat testing.  While 2019 novel coronavirus  (SARS-CoV-2) nucleic acids may be present in the submitted sample  additional confirmatory testing may be necessary for epidemiological  and / or clinical management purposes  to differentiate between  SARS-CoV-2 and other Sarbecovirus currently known to infect humans.  If clinically indicated additional testing with an alternate test  methodology [phone]) is advised. The SARS-CoV-2 RNA is generally  detectable in upper and lower respiratory sp ecimens during the acute  phase of infection. The expected result is Negative. Fact Sheet for Patients:  BoilerBrush.com.cyhttps://www.fda.gov/media/136312/download Fact Sheet for Healthcare Providers: https://pope.com/https://www.fda.gov/media/136313/download This test is not yet approved or cleared by the [place] FDA and has been authorized for detection and/or diagnosis of SARS-CoV-2 by FDA under an Emergency Use Authorization (EUA).  This EUA will remain in effect (meaning this test can be used) for the duration of the COVID-19 declaration under Section 564(b)(1) of the Act, 21 U.S.C. section 360bbb-3(b)(1), unless the authorization is terminated or revoked sooner. Performed at [facility], [address] [place], [place] [address]          Radiology Studies: Dg Chest Port 1 View  Result Date: 06/27/2018 CLINICAL DATA:  Shortness of breath and confusion. EXAM: PORTABLE CHEST 1 VIEW COMPARISON:  06/26/2018 FINDINGS: There is a retrocardiac opacity. There is some elevation of the left hemidiaphragm. There is no pneumothorax. The lungs are somewhat hyperexpanded. There is no acute osseous abnormality. IMPRESSION: New retrocardiac opacity concerning for developing pneumonia or aspiration. A small underlying pleural effusion is not excluded. Electronically Signed   By: Katherine Mantlehristopher  Green M.D.   On: 06/27/2018 20:03        Scheduled Meds:  busPIRone  15 mg Oral BID   carbidopa-levodopa  1 tablet Oral Q24H   And   Carbidopa-Levodopa ER  1 tablet Oral 2 times per day   donepezil  10 mg Oral QHS   enoxaparin (LOVENOX) injection  40 mg Subcutaneous Q24H   mirtazapine  15 mg Oral QHS   Continuous Infusions:  ampicillin-sulbactam (UNASYN) IV 3 g (06/29/18 0607)   dextrose 5 % and 0.45 % NaCl with KCl 10 mEq/L 100 mL/hr at 06/28/18 1655     LOS: 1 day     Alwyn RenElizabeth G Jerelyn Trimarco, MD [facility]  If 7PM-7AM, please contact night-coverage [email or web address] Password  [email or web address] 06/29/2018, 11:33 AM

## 2018-06-30 DIAGNOSIS — E440 Moderate protein-calorie malnutrition: Secondary | ICD-10-CM

## 2018-06-30 MED ORDER — AMOXICILLIN-POT CLAVULANATE 875-125 MG PO TABS: 1.0000 | ORAL_TABLET | Freq: Two times a day (BID) | ORAL | 0 refills | Status: AC

## 2018-06-30 NOTE — TOC Progression Note (Signed)
Transition of Care [facility]) - Progression Note    Patient Details  Name: Susan Simmons MRN: [id number] Date of Birth: [birth year]  Transition [facility] CM/SW Contact  Nelwyn [place], [place] Phone Number: [phone] 06/30/2018, 12:15 PM  Clinical Narrative:   Pt admitting to [facility] today- report [phone] CSW will arrange transport ([facility]) once pt's family has completed paperwork with hospice liaison         Expected Discharge Plan and Services    hospice facility       Expected Discharge Date: 06/30/18                                     Social Determinants of Health (SDOH) Interventions    Readmission Risk Interventions No flowsheet data found.

## 2018-06-30 NOTE — Discharge Summary (Signed)
Physician Discharge Summary  Susan Simmons WUJ:[id number] DOB: 1934/04/08 DOA: 06/27/2018  PCP: Clayborn Heronankins, [name], MD  Admit date: 06/27/2018 Discharge date: 06/30/2018  Admitted From:home Disposition: [facility]  Recommendations for Outpatient Follow-up:  1. Follow up with PCP in 1-2 weeks 2. Please obtain BMP/CBC in one week   Home Health none Equipment/Devices: None Discharge Condition discharged to hospice CODE STATUS DNR per hospice Diet recommendation: Dysphagia 3 nectar thick liquid diet  Brief/Interim Summary:84 y.o.femalewith medical history significant foradvanced parkinson's dementia who presents with above.  Lives at home with her daughter. Receiving outpatient hospice services from [facility]. That was started relatively recently.  History obtained form patient's daughter and ED staff as patient is not communicative.  Per daughter's report, patient often chokes when drinking and eating. Had an episode of choking yesterday causing respiratory distress, evaluated in this ED, discharged home with levofloxacin rx. Had another episode today of choking while drinking water, developed worsened respiratory distress. On O2 at home, per report hypoxic with EMS and on a non-rebreather, at the time of my exam breathing comfortably with O2 at 4 l/min and o2 100%. Is on 2 L at home. Per daughter patient is not as alert as she normally is, typically able to make basic needs known, oriented to self.   Discharge Diagnoses:  Active Problems:   Malnutrition of moderate degree   Parkinson's disease (HCC)   Dementia due to Parkinson's disease without behavioral disturbance (HCC)   Aspiration pneumonia (HCC)   Pressure injury of skin   Community acquired pneumonia   Aspiration pneumonia - multiple recent aspiration events in setting of advanced parkinsons.  Patient hadincreased O2 requirement and retrocardiac opacity seen on CXR.   She is on 2 L of oxygen at home  24/7.  Patient was treated with IV Unasyn and her symptoms improved she has been afebrile with stable vital signs and normal white count.  She will be discharged on Augmentin twice a day for 1 more day for to finish the course.  She will be discharged to [facility] today.   Chronic respiratory failure - on 2 L at home only on room air saturation 100%.  Decubitus ulcers - multiple, some at least stage 3. Do not appear infected.  Parkinson's dementia - continue home buspar, mirtazapine, carbidopa/levodopa    Pressure Injury 03/02/18 Stage III -  Full thickness tissue loss. Subcutaneous fat may be visible but bone, tendon or muscle are NOT exposed. (Active)  03/02/18 2040  Location: Shoulder  Location Orientation: Left  Staging: Stage III -  Full thickness tissue loss. Subcutaneous fat may be visible but bone, tendon or muscle are NOT exposed.  Wound Description (Comments):   Present on Admission:      Pressure Injury 03/02/18 Stage III -  Full thickness tissue loss. Subcutaneous fat may be visible but bone, tendon or muscle are NOT exposed. (Active)  03/02/18 2045  Location: Sacrum  Location Orientation: Medial  Staging: Stage III -  Full thickness tissue loss. Subcutaneous fat may be visible but bone, tendon or muscle are NOT exposed.  Wound Description (Comments):   Present on Admission:      Pressure Injury 03/02/18 Stage I -  Intact skin with non-blanchable redness of a localized area usually over a bony prominence. (Active)  03/02/18 2047  Location: Heel  Location Orientation: Bilateral  Staging: Stage I -  Intact skin with non-blanchable redness of a localized area usually over a bony prominence.  Wound Description (Comments):  Present on Admission:      Pressure Injury 03/02/18 (Active)  03/02/18 2049  Location: Ear  Location Orientation: Left  Staging:   Wound Description (Comments):   Present on Admission:      Pressure Injury 03/03/18 Stage III -  Full  thickness tissue loss. Subcutaneous fat may be visible but bone, tendon or muscle are NOT exposed. (Active)  03/03/18 0055  Location: Ischial tuberosity  Location Orientation: Right  Staging: Stage III -  Full thickness tissue loss. Subcutaneous fat may be visible but bone, tendon or muscle are NOT exposed.  Wound Description (Comments):   Present on Admission:      Pressure Injury 06/28/18 Unstageable - Full thickness tissue loss in which the base of the ulcer is covered by slough (yellow, tan, gray, green or brown) and/or eschar (tan, brown or black) in the wound bed. pressure injury to LEFT upper calf, Eschar (Active)  06/28/18 0230  Location: Leg  Location Orientation: Left;Posterior;Lower  Staging: Unstageable - Full thickness tissue loss in which the base of the ulcer is covered by slough (yellow, tan, gray, green or brown) and/or eschar (tan, brown or black) in the wound bed.  Wound Description (Comments): pressure injury to LEFT upper calf, Eschar  Present on Admission: Yes     Pressure Injury 06/28/18 Unstageable - Full thickness tissue loss in which the base of the ulcer is covered by slough (yellow, tan, gray, green or brown) and/or eschar (tan, brown or black) in the wound bed. Left, posterior mid-calf ulcer covered in esch (Active)  06/28/18 0230  Location: Leg  Location Orientation: Left;Posterior;Lower  Staging: Unstageable - Full thickness tissue loss in which the base of the ulcer is covered by slough (yellow, tan, gray, green or brown) and/or eschar (tan, brown or black) in the wound bed.  Wound Description (Comments): Left, posterior mid-calf ulcer covered in eschar  Present on Admission: Yes     Pressure Injury 06/28/18 Unstageable - Full thickness tissue loss in which the base of the ulcer is covered by slough (yellow, tan, gray, green or brown) and/or eschar (tan, brown or black) in the wound bed. Pressure injury over achilles tendon covered b (Active)  06/28/18 0230   Location: Leg  Location Orientation: Left;Posterior;Lower  Staging: Unstageable - Full thickness tissue loss in which the base of the ulcer is covered by slough (yellow, tan, gray, green or brown) and/or eschar (tan, brown or black) in the wound bed.  Wound Description (Comments): Pressure injury over achilles tendon covered by eschar  Present on Admission: Yes     Pressure Injury 06/28/18 Unstageable - Full thickness tissue loss in which the base of the ulcer is covered by slough (yellow, tan, gray, green or brown) and/or eschar (tan, brown or black) in the wound bed. Full thickness pressure injury covered by esch (Active)  06/28/18 0230  Location: Heel  Location Orientation: Left  Staging: Unstageable - Full thickness tissue loss in which the base of the ulcer is covered by slough (yellow, tan, gray, green or brown) and/or eschar (tan, brown or black) in the wound bed.  Wound Description (Comments): Full thickness pressure injury covered by eschar LEFT Heel  Present on Admission: Yes     Pressure Injury 06/28/18 Unstageable - Full thickness tissue loss in which the base of the ulcer is covered by slough (yellow, tan, gray, green or brown) and/or eschar (tan, brown or black) in the wound bed. base covered in eschar (Active)  06/28/18 0230  Location: Leg  Location Orientation: Posterior;Lower;Right  Staging: Unstageable - Full thickness tissue loss in which the base of the ulcer is covered by slough (yellow, tan, gray, green or brown) and/or eschar (tan, brown or black) in the wound bed.  Wound Description (Comments): base covered in eschar  Present on Admission: Yes     Pressure Injury 06/28/18 Unstageable - Full thickness tissue loss in which the base of the ulcer is covered by slough (yellow, tan, gray, green or brown) and/or eschar (tan, brown or black) in the wound bed. (Active)  06/28/18 0230  Location: Leg  Location Orientation: Right;Posterior;Lower  Staging: Unstageable - Full  thickness tissue loss in which the base of the ulcer is covered by slough (yellow, tan, gray, green or brown) and/or eschar (tan, brown or black) in the wound bed.  Wound Description (Comments):   Present on Admission: Yes     Pressure Injury 06/28/18 Unstageable - Full thickness tissue loss in which the base of the ulcer is covered by slough (yellow, tan, gray, green or brown) and/or eschar (tan, brown or black) in the wound bed. (Active)  06/28/18 0230  Location: Leg  Location Orientation: Right;Posterior;Lower  Staging: Unstageable - Full thickness tissue loss in which the base of the ulcer is covered by slough (yellow, tan, gray, green or brown) and/or eschar (tan, brown or black) in the wound bed.  Wound Description (Comments):   Present on Admission: Yes     Pressure Injury 06/28/18 Unstageable - Full thickness tissue loss in which the base of the ulcer is covered by slough (yellow, tan, gray, green or brown) and/or eschar (tan, brown or black) in the wound bed. RIGHT Heel (Active)  06/28/18 0230  Location: Heel  Location Orientation: Right  Staging: Unstageable - Full thickness tissue loss in which the base of the ulcer is covered by slough (yellow, tan, gray, green or brown) and/or eschar (tan, brown or black) in the wound bed.  Wound Description (Comments): RIGHT Heel  Present on Admission: Yes     Pressure Injury 06/28/18 Unstageable - Full thickness tissue loss in which the base of the ulcer is covered by slough (yellow, tan, gray, green or brown) and/or eschar (tan, brown or black) in the wound bed. (Active)  06/28/18 0230  Location: Sacrum  Location Orientation:   Staging: Unstageable - Full thickness tissue loss in which the base of the ulcer is covered by slough (yellow, tan, gray, green or brown) and/or eschar (tan, brown or black) in the wound bed.  Wound Description (Comments):   Present on Admission: Yes    Estimated body mass index is 16.14 kg/m as calculated from  the following:   Height as of this encounter:  (1.676 m).   Weight as of this encounter: 45.4 kg.  Discharge Instructions  Discharge Instructions    Diet - low sodium heart healthy   Complete by:  As directed    Increase activity slowly   Complete by:  As directed      Allergies as of 06/30/2018   No Known Allergies     Medication List    STOP taking these medications   collagenase ointment Commonly known as:  SANTYL   levofloxacin 500 MG tablet Commonly known as:  LEVAQUIN   sodium hypochlorite external solution Commonly known as:  DAKIN'S 1/2 STRENGTH     TAKE these medications   amoxicillin-clavulanate 875-125 MG tablet Commonly known as:  Augmentin Take 1 tablet by mouth 2 (two) times daily for 1 day.  busPIRone 15 MG tablet Commonly known as:  BUSPAR Take 1 tablet (15 mg total) by mouth daily. What changed:  when to take this   Carbidopa-Levodopa ER 36.25-145 MG Cpcr Commonly known as:  Rytary Take 1 tablet by mouth 3 (three) times daily. What changed:  when to take this   donepezil 10 MG tablet Commonly known as:  ARICEPT Take 1 tablet (10 mg total) by mouth at bedtime.   Ensure Take 237 mLs by mouth 3 (three) times daily between meals.   loratadine 10 MG tablet Commonly known as:  CLARITIN Take 10 mg by mouth daily.   mirtazapine 15 MG tablet Commonly known as:  REMERON Take 1 tablet (15 mg total) by mouth at bedtime.      Follow-up Information    Rankins, Fanny Dance, MD Follow up.   Specialty:  Family Medicine Contact information: [address] [place] [address] [phone]          No Known Allergies  Consultations:  Hospice   Procedures/Studies: Ct Angio Chest Pe W And/or Wo Contrast  Result Date: 06/11/2018 CLINICAL DATA:  Cough and shortness of breath. EXAM: CT ANGIOGRAPHY CHEST WITH CONTRAST TECHNIQUE: Multidetector CT imaging of the chest was performed using the standard protocol during bolus  administration of intravenous contrast. Multiplanar CT image reconstructions and MIPs were obtained to evaluate the vascular anatomy. CONTRAST:  55mL OMNIPAQUE IOHEXOL 350 MG/ML SOLN COMPARISON:  CT chest dated March 02, 2018. FINDINGS: Cardiovascular: No central or lobar pulmonary embolism. Evaluation of the segmental pulmonary arteries is limited due to respiratory motion artifact. Normal caliber pulmonary arteries. Normal heart size. No pericardial effusion. No thoracic aortic aneurysm or dissection. Coronary, aortic arch, and branch vessel atherosclerotic vascular disease. Mediastinum/Nodes: No enlarged mediastinal, hilar, or axillary lymph nodes. Unchanged thyroid goiter. The trachea and esophagus demonstrate no significant findings. Lungs/Pleura: New scattered patchy peribronchovascular ground-glass densities in both lower lobes and to a lesser extent the right upper and middle lobes. No pleural effusion or pneumothorax. No suspicious pulmonary nodule. Unchanged biapical pleuroparenchymal scarring. Upper Abdomen: No acute abnormality. Musculoskeletal: No chest wall abnormality. No acute or significant osseous findings. Review of the MIP images confirms the above findings. IMPRESSION: 1. No central or lobar pulmonary embolism. Evaluation of the segmental pulmonary arteries is limited due to respiratory motion artifact. 2. New scattered patchy peribronchovascular ground-glass densities in both lower lobes and to a lesser extent the right upper and middle lobes, likely infectious or inflammatory. 3. No thoracic aortic dissection. Aortic atherosclerosis (ICD10-I70.0). Electronically Signed   By: Obie Dredge M.D.   On: 06/11/2018 12:37   Dg Chest Port 1 View  Result Date: 06/27/2018 CLINICAL DATA:  Shortness of breath and confusion. EXAM: PORTABLE CHEST 1 VIEW COMPARISON:  06/26/2018 FINDINGS: There is a retrocardiac opacity. There is some elevation of the left hemidiaphragm. There is no pneumothorax. The  lungs are somewhat hyperexpanded. There is no acute osseous abnormality. IMPRESSION: New retrocardiac opacity concerning for developing pneumonia or aspiration. A small underlying pleural effusion is not excluded. Electronically Signed   By: Katherine Mantle M.D.   On: 06/27/2018 20:03   Dg Chest Port 1 View  Result Date: 06/26/2018 CLINICAL DATA:  Difficulty swelling. EXAM: PORTABLE CHEST 1 VIEW COMPARISON:  Jun 11, 2018 FINDINGS: The heart, hila, mediastinum, lungs, and pleura are normal. No focal infiltrate or acute abnormality. IMPRESSION: No active disease. Electronically Signed   By: Gerome Sam III M.D   On: 06/26/2018 10:45   Dg  Chest Port 1 View  Result Date: 06/11/2018 CLINICAL DATA:  Shortness of breath and cough for 3 days. EXAM: PORTABLE CHEST 1 VIEW COMPARISON:  PA and lateral chest 03/17/2018 and 11/18/2015. CT chest 03/02/2018. FINDINGS: The lungs are clear. Heart size is upper normal. No pneumothorax or pleural effusion. Aortic atherosclerosis noted. No acute or focal bony abnormality. IMPRESSION: No acute disease. Atherosclerosis. Electronically Signed   By: Drusilla Kanner M.D.   On: 06/11/2018 09:54    (Echo, Carotid, EGD, Colonoscopy, ERCP)    Subjective:  Sitting in bed appears comfortable eyes closed Discharge Exam: Vitals:   06/29/18 2106 06/30/18 0640  BP: (!) 144/52 (!) 122/55  Pulse: 84 75  Resp: (!) 25 20  Temp: 98.3 F (36.8 C) 97.7 F (36.5 C)  SpO2: 99% 98%   Vitals:   06/29/18 0420 06/29/18 1224 06/29/18 2106 06/30/18 0640  BP: 119/60 138/81 (!) 144/52 (!) 122/55  Pulse: 62 81 84 75  Resp: 18 20 (!) 25 20  Temp: 98 F (36.7 C) 98.2 F (36.8 C) 98.3 F (36.8 C) 97.7 F (36.5 C)  TempSrc: Oral Oral Oral Oral  SpO2: 100% 100% 99% 98%  Weight:      Height:        General: Pt is alert, awake, not in acute distress Cardiovascular: RRR, S1/S2 +, no rubs, no gallops Respiratory: CTA bilaterally, no wheezing, no rhonchi Abdominal: Soft, NT,  ND, bowel sounds + Extremities: Trace lower lower extremity edema and right upper extremity edema   The results of significant diagnostics from this hospitalization (including imaging, microbiology, ancillary and laboratory) are listed below for reference.     Microbiology: Recent Results (from the past 240 hour(s))  SARS Coronavirus 2 (CEPHEID- Performed in [facility] hospital lab), Hosp Order     Status: None   Collection Time: 06/26/18 11:42 AM  Result Value Ref Range Status   SARS Coronavirus 2 NEGATIVE NEGATIVE Final    Comment: (NOTE) If result is NEGATIVE SARS-CoV-2 target nucleic acids are NOT DETECTED. The SARS-CoV-2 RNA is generally detectable in upper and lower  respiratory specimens during the acute phase of infection. The lowest  concentration of SARS-CoV-2 viral copies this assay can detect is 250  copies / mL. A negative result does not preclude SARS-CoV-2 infection  and should not be used as the sole basis for treatment or other  patient management decisions.  A negative result may occur with  improper specimen collection / handling, submission of specimen other  than nasopharyngeal swab, presence of viral mutation(s) within the  areas targeted by this assay, and inadequate number of viral copies  (<250 copies / mL). A negative result must be combined with clinical  observations, patient history, and epidemiological information. If result is POSITIVE SARS-CoV-2 target nucleic acids are DETECTED. The SARS-CoV-2 RNA is generally detectable in upper and lower  respiratory specimens dur ing the acute phase of infection.  Positive  results are indicative of active infection with SARS-CoV-2.  Clinical  correlation with patient history and other diagnostic information is  necessary to determine patient infection status.  Positive results do  not rule out bacterial infection or co-infection with other viruses. If result is PRESUMPTIVE POSTIVE SARS-CoV-2 nucleic acids MAY  BE PRESENT.   A presumptive positive result was obtained on the submitted specimen  and confirmed on repeat testing.  While 2019 novel coronavirus  (SARS-CoV-2) nucleic acids may be present in the submitted sample  additional confirmatory testing may be necessary for epidemiological  and / or clinical management purposes  to differentiate between  SARS-CoV-2 and other Sarbecovirus currently known to infect humans.  If clinically indicated additional testing with an alternate test  methodology [phone]) is advised. The SARS-CoV-2 RNA is generally  detectable in upper and lower respiratory sp ecimens during the acute  phase of infection. The expected result is Negative. Fact Sheet for Patients:  BoilerBrush.com.cy Fact Sheet for Healthcare Providers: https://pope.com/ This test is not yet approved or cleared by the [place] FDA and has been authorized for detection and/or diagnosis of SARS-CoV-2 by FDA under an Emergency Use Authorization (EUA).  This EUA will remain in effect (meaning this test can be used) for the duration of the COVID-19 declaration under Section 564(b)(1) of the Act, 21 U.S.C. section 360bbb-3(b)(1), unless the authorization is terminated or revoked sooner. Performed at [facility], [address] [place], [place] [address]   Culture, blood (routine x 2)     Status: None (Preliminary result)   Collection Time: 06/26/18 12:17 PM  Result Value Ref Range Status   Specimen Description   Final    BLOOD RIGHT ARM Performed at [facility], [address] [place], [place] [address]    Special Requests   Final    BOTTLES DRAWN AEROBIC AND ANAEROBIC Blood Culture adequate volume   Culture   Final    NO GROWTH 4 DAYS Performed at [facility], [address] [place], [place] [address]    Report Status PENDING  Incomplete  SARS Coronavirus 2 (CEPHEID- Performed in [facility] hospital lab), Hosp Order     Status: None   Collection Time: 06/27/18  8:04 PM  Result Value Ref Range Status   SARS Coronavirus 2 NEGATIVE NEGATIVE Final    Comment: (NOTE) If result is NEGATIVE SARS-CoV-2 target nucleic acids are NOT DETECTED. The SARS-CoV-2 RNA is generally detectable in upper and lower  respiratory specimens during the acute phase of infection. The lowest  concentration of SARS-CoV-2 viral copies this assay can detect is 250  copies / mL. A negative result does not preclude SARS-CoV-2 infection  and should not be used as the sole basis for treatment or other  patient management decisions.  A negative result may occur with  improper specimen collection / handling, submission of specimen other  than nasopharyngeal swab, presence of viral mutation(s) within the  areas targeted by this assay, and inadequate number of viral copies  (<250 copies / mL). A negative result must be combined with clinical  observations, patient history, and epidemiological information. If result is POSITIVE SARS-CoV-2 target nucleic acids are DETECTED. The SARS-CoV-2 RNA is generally detectable in upper and lower  respiratory specimens dur ing the acute phase of infection.  Positive  results are indicative of active infection with SARS-CoV-2.  Clinical  correlation with patient history and other diagnostic information is  necessary to determine patient infection status.  Positive results do  not rule out bacterial infection or co-infection with other viruses. If result is PRESUMPTIVE POSTIVE SARS-CoV-2 nucleic acids MAY BE PRESENT.   A presumptive positive result was obtained on the submitted specimen  and confirmed on repeat testing.  While 2019 novel coronavirus  (SARS-CoV-2) nucleic acids may be present in the submitted sample  additional confirmatory testing may be necessary for epidemiological  and / or clinical management purposes  to differentiate between  SARS-CoV-2 and  other Sarbecovirus currently known to infect humans.  If clinically indicated additional testing  with an alternate test  methodology [phone]) is advised. The SARS-CoV-2 RNA is generally  detectable in upper and lower respiratory sp ecimens during the acute  phase of infection. The expected result is Negative. Fact Sheet for Patients:  BoilerBrush.com.cy Fact Sheet for Healthcare Providers: https://pope.com/ This test is not yet approved or cleared by the [place] FDA and has been authorized for detection and/or diagnosis of SARS-CoV-2 by FDA under an Emergency Use Authorization (EUA).  This EUA will remain in effect (meaning this test can be used) for the duration of the COVID-19 declaration under Section 564(b)(1) of the Act, 21 U.S.C. section 360bbb-3(b)(1), unless the authorization is terminated or revoked sooner. Performed at [facility], [address], [place], [place] [address]      Labs: BNP (last 3 results) Recent Labs    06/11/18 1108  BNP 131.8*   Basic Metabolic Panel: Recent Labs  Lab 06/26/18 1217 06/27/18 2004 06/28/18 0541  NA 140 138  --   K 4.3 4.0  --   CL 104 103  --   CO2 29 25  --   GLUCOSE 106* 109*  --   BUN 21 31*  --   CREATININE 0.75 0.84 0.69  CALCIUM 8.5* 8.8*  --    Liver Function Tests: No results for input(s): AST, ALT, ALKPHOS, BILITOT, PROT, ALBUMIN in the last 168 hours. No results for input(s): LIPASE, AMYLASE in the last 168 hours. No results for input(s): AMMONIA in the last 168 hours. CBC: Recent Labs  Lab 06/26/18 1217 06/27/18 2004 06/28/18 0541  WBC 9.5 8.7 8.6  NEUTROABS 7.9* 6.2  --   HGB 10.9* 10.6* 9.4*  HCT 37.3 37.2 32.4*  MCV 84.0 83.2 83.9  PLT 462* 495* 397   Cardiac Enzymes: Recent Labs  Lab 06/27/18 2004  TROPONINI <0.03   BNP: Invalid input(s): POCBNP CBG: No results for input(s): GLUCAP in the last 168 hours. D-Dimer No  results for input(s): DDIMER in the last 72 hours. Hgb A1c No results for input(s): HGBA1C in the last 72 hours. Lipid Profile No results for input(s): CHOL, HDL, LDLCALC, TRIG, CHOLHDL, LDLDIRECT in the last 72 hours. Thyroid function studies No results for input(s): TSH, T4TOTAL, T3FREE, THYROIDAB in the last 72 hours.  Invalid input(s): FREET3 Anemia work up No results for input(s): VITAMINB12, FOLATE, FERRITIN, TIBC, IRON, RETICCTPCT in the last 72 hours. Urinalysis    Component Value Date/Time   COLORURINE STRAW (A) 03/02/2018 1414   APPEARANCEUR CLEAR 03/02/2018 1414   LABSPEC 1.003 (L) 03/02/2018 1414   PHURINE 6.0 03/02/2018 1414   GLUCOSEU NEGATIVE 03/02/2018 1414   HGBUR NEGATIVE 03/02/2018 1414   BILIRUBINUR NEGATIVE 03/02/2018 1414   KETONESUR NEGATIVE 03/02/2018 1414   PROTEINUR NEGATIVE 03/02/2018 1414   UROBILINOGEN 1.0 11/09/2014 0759   NITRITE NEGATIVE 03/02/2018 1414   LEUKOCYTESUR NEGATIVE 03/02/2018 1414   Sepsis Labs Invalid input(s): PROCALCITONIN,  WBC,  LACTICIDVEN Microbiology Recent Results (from the past 240 hour(s))  SARS Coronavirus 2 (CEPHEID- Performed in [facility] hospital lab), Hosp Order     Status: None   Collection Time: 06/26/18 11:42 AM  Result Value Ref Range Status   SARS Coronavirus 2 NEGATIVE NEGATIVE Final    Comment: (NOTE) If result is NEGATIVE SARS-CoV-2 target nucleic acids are NOT DETECTED. The SARS-CoV-2 RNA is generally detectable in upper and lower  respiratory specimens during the acute phase of infection. The lowest  concentration of SARS-CoV-2 viral copies this assay can detect is 250  copies / mL. A negative result does not preclude SARS-CoV-2 infection  and should not be used as the sole basis for treatment or other  patient management decisions.  A negative result may occur with  improper specimen collection / handling, submission of specimen other  than nasopharyngeal swab, presence of viral mutation(s) within  the  areas targeted by this assay, and inadequate number of viral copies  (<250 copies / mL). A negative result must be combined with clinical  observations, patient history, and epidemiological information. If result is POSITIVE SARS-CoV-2 target nucleic acids are DETECTED. The SARS-CoV-2 RNA is generally detectable in upper and lower  respiratory specimens dur ing the acute phase of infection.  Positive  results are indicative of active infection with SARS-CoV-2.  Clinical  correlation with patient history and other diagnostic information is  necessary to determine patient infection status.  Positive results do  not rule out bacterial infection or co-infection with other viruses. If result is PRESUMPTIVE POSTIVE SARS-CoV-2 nucleic acids MAY BE PRESENT.   A presumptive positive result was obtained on the submitted specimen  and confirmed on repeat testing.  While 2019 novel coronavirus  (SARS-CoV-2) nucleic acids may be present in the submitted sample  additional confirmatory testing may be necessary for epidemiological  and / or clinical management purposes  to differentiate between  SARS-CoV-2 and other Sarbecovirus currently known to infect humans.  If clinically indicated additional testing with an alternate test  methodology [phone]) is advised. The SARS-CoV-2 RNA is generally  detectable in upper and lower respiratory sp ecimens during the acute  phase of infection. The expected result is Negative. Fact Sheet for Patients:  BoilerBrush.com.cy Fact Sheet for Healthcare Providers: https://pope.com/ This test is not yet approved or cleared by the [place] FDA and has been authorized for detection and/or diagnosis of SARS-CoV-2 by FDA under an Emergency Use Authorization (EUA).  This EUA will remain in effect (meaning this test can be used) for the duration of the COVID-19 declaration under Section 564(b)(1) of the Act, 21  U.S.C. section 360bbb-3(b)(1), unless the authorization is terminated or revoked sooner. Performed at [facility], [address] [place], [place] [address]   Culture, blood (routine x 2)     Status: None (Preliminary result)   Collection Time: 06/26/18 12:17 PM  Result Value Ref Range Status   Specimen Description   Final    BLOOD RIGHT ARM Performed at [facility], [address], [place], [place] [address]    Special Requests   Final    BOTTLES DRAWN AEROBIC AND ANAEROBIC Blood Culture adequate volume   Culture   Final    NO GROWTH 4 DAYS Performed at [facility], [address] [place], [place] [address]    Report Status PENDING  Incomplete  SARS Coronavirus 2 (CEPHEID- Performed in [facility] hospital lab), Hosp Order     Status: None   Collection Time: 06/27/18  8:04 PM  Result Value Ref Range Status   SARS Coronavirus 2 NEGATIVE NEGATIVE Final    Comment: (NOTE) If result is NEGATIVE SARS-CoV-2 target nucleic acids are NOT DETECTED. The SARS-CoV-2 RNA is generally detectable in upper and lower  respiratory specimens during the acute phase of infection. The lowest  concentration of SARS-CoV-2 viral copies this assay can detect is 250  copies / mL. A negative result does not preclude SARS-CoV-2 infection  and should not be used as the sole basis for treatment or other  patient management decisions.  A negative result may occur with  improper specimen collection / handling, submission of specimen other  than nasopharyngeal swab, presence of viral mutation(s) within the  areas targeted by this assay, and inadequate number of viral copies  (<250 copies / mL). A negative result must be combined with clinical  observations, patient history, and epidemiological information. If result is POSITIVE SARS-CoV-2 target nucleic acids are DETECTED. The SARS-CoV-2 RNA is generally detectable in upper and lower  respiratory specimens  dur ing the acute phase of infection.  Positive  results are indicative of active infection with SARS-CoV-2.  Clinical  correlation with patient history and other diagnostic information is  necessary to determine patient infection status.  Positive results do  not rule out bacterial infection or co-infection with other viruses. If result is PRESUMPTIVE POSTIVE SARS-CoV-2 nucleic acids MAY BE PRESENT.   A presumptive positive result was obtained on the submitted specimen  and confirmed on repeat testing.  While 2019 novel coronavirus  (SARS-CoV-2) nucleic acids may be present in the submitted sample  additional confirmatory testing may be necessary for epidemiological  and / or clinical management purposes  to differentiate between  SARS-CoV-2 and other Sarbecovirus currently known to infect humans.  If clinically indicated additional testing with an alternate test  methodology [phone]) is advised. The SARS-CoV-2 RNA is generally  detectable in upper and lower respiratory sp ecimens during the acute  phase of infection. The expected result is Negative. Fact Sheet for Patients:  BoilerBrush.com.cy Fact Sheet for Healthcare Providers: https://pope.com/ This test is not yet approved or cleared by the [place] FDA and has been authorized for detection and/or diagnosis of SARS-CoV-2 by FDA under an Emergency Use Authorization (EUA).  This EUA will remain in effect (meaning this test can be used) for the duration of the COVID-19 declaration under Section 564(b)(1) of the Act, 21 U.S.C. section 360bbb-3(b)(1), unless the authorization is terminated or revoked sooner. Performed at [facility], [address], [place], [place] [address]      Time coordinating discharge: 34 minutes  SIGNED:   Alwyn Ren, MD  [facility] 06/30/2018, 10:59 AM Pager   If 7PM-7AM, please contact  night-coverage [email or web address] Password [email or web address]

## 2018-06-30 NOTE — Progress Notes (Signed)
Report called to Okey Regal at [facility]. No questions or concerns at this time. Pt transported via [employer]

## 2018-06-30 NOTE — Consult Note (Signed)
WOC Nurse wound consult note Patient receiving care in [id number].  I spoke with her primary RN, Selena Batten, who explained that she is due to be discharged today to [facility] for Hospice care. Reason for Consult: Stage 2/3 decubitus wounds Wound type: Unstageable PI to sacrum, bilateral heels and two areas on her back. Pressure Injury POA: Yes Measurement: See RN flowsheet Wound bed: Sacrum is covered in slough.  This wound has been being treated with saline moistened gauze, topped with dry gauze and ABD pad.  Bilateral heels have dry stable eschar that are open to air and being floated.  And the two back wounds are covered in dry stable eschar and covered with dry gauze. Periwound: Intact. Dressing procedure/placement/frequency: If the patient remains in the hospital, continue the plan of care as outlined above.  I understand at this time, she is to be discharged today. Monitor the wound area(s) for worsening of condition such as: Signs/symptoms of infection,  Increase in size,  Development of or worsening of odor, Development of pain, or increased pain at the affected locations.  Notify the medical team if any of these develop.  Thank you for the consult.  Discussed plan of care with the bedside nurse.  WOC nurse will not follow at this time.  Please re-consult the WOC team if needed.  Helmut Muster, RN, MSN, CWOCN, CNS-BC, pager [phone]

## 2018-07-01 LAB — CULTURE, BLOOD (ROUTINE X 2)
Culture: NO GROWTH
Special Requests: ADEQUATE

## 2018-08-05 ENCOUNTER — Encounter (HOSPITAL_COMMUNITY): Payer: Medicare Other

## 2018-08-05 ENCOUNTER — Ambulatory Visit (HOSPITAL_COMMUNITY): Payer: Medicare Other

## 2018-08-06 DEATH — deceased

## 2018-08-22 ENCOUNTER — Ambulatory Visit: Payer: Medicare Other | Admitting: Neurology

## 2018-09-16 ENCOUNTER — Ambulatory Visit: Payer: Medicare Other | Admitting: Neurology

## 2018-11-21 ENCOUNTER — Ambulatory Visit: Payer: Medicare Other | Admitting: Neurology
